# Patient Record
Sex: Female | Born: 1945 | Race: Black or African American | Hispanic: No | Smoking: Never smoker
Health system: Southern US, Community
[De-identification: ages and names within clinical notes are randomized; demographics above are authoritative.]

## PROBLEM LIST (undated history)

## (undated) DIAGNOSIS — F329 Major depressive disorder, single episode, unspecified: Secondary | ICD-10-CM

## (undated) DIAGNOSIS — T148XXA Other injury of unspecified body region, initial encounter: Secondary | ICD-10-CM

## (undated) DIAGNOSIS — F0390 Unspecified dementia without behavioral disturbance: Secondary | ICD-10-CM

## (undated) DIAGNOSIS — R269 Unspecified abnormalities of gait and mobility: Secondary | ICD-10-CM

## (undated) DIAGNOSIS — T48201A Poisoning by unspecified drugs acting on muscles, accidental (unintentional), initial encounter: Secondary | ICD-10-CM

## (undated) DIAGNOSIS — I10 Essential (primary) hypertension: Secondary | ICD-10-CM

## (undated) DIAGNOSIS — R413 Other amnesia: Secondary | ICD-10-CM

## (undated) DIAGNOSIS — F09 Unspecified mental disorder due to known physiological condition: Secondary | ICD-10-CM

## (undated) DIAGNOSIS — I82409 Acute embolism and thrombosis of unspecified deep veins of unspecified lower extremity: Secondary | ICD-10-CM

## (undated) DIAGNOSIS — D219 Benign neoplasm of connective and other soft tissue, unspecified: Secondary | ICD-10-CM

## (undated) DIAGNOSIS — E785 Hyperlipidemia, unspecified: Secondary | ICD-10-CM

## (undated) DIAGNOSIS — N319 Neuromuscular dysfunction of bladder, unspecified: Secondary | ICD-10-CM

## (undated) DIAGNOSIS — F32A Depression, unspecified: Secondary | ICD-10-CM

## (undated) DIAGNOSIS — L089 Local infection of the skin and subcutaneous tissue, unspecified: Secondary | ICD-10-CM

## (undated) DIAGNOSIS — G500 Trigeminal neuralgia: Secondary | ICD-10-CM

## (undated) DIAGNOSIS — G35 Multiple sclerosis: Secondary | ICD-10-CM

## (undated) HISTORY — PX: OTHER SURGICAL HISTORY: SHX169

## (undated) HISTORY — DX: Other amnesia: R41.3

## (undated) HISTORY — DX: Neuromuscular dysfunction of bladder, unspecified: N31.9

## (undated) HISTORY — DX: Acute embolism and thrombosis of unspecified deep veins of unspecified lower extremity: I82.409

## (undated) HISTORY — DX: Unspecified abnormalities of gait and mobility: R26.9

## (undated) HISTORY — PX: STRABISMUS SURGERY: SHX218

---

## 2001-08-21 ENCOUNTER — Other Ambulatory Visit: Admission: RE | Admit: 2001-08-21 | Discharge: 2001-08-21 | Payer: Self-pay | Admitting: Obstetrics and Gynecology

## 2001-08-27 ENCOUNTER — Ambulatory Visit (HOSPITAL_COMMUNITY): Admission: RE | Admit: 2001-08-27 | Discharge: 2001-08-27 | Payer: Self-pay | Admitting: Obstetrics and Gynecology

## 2001-08-27 ENCOUNTER — Encounter: Payer: Self-pay | Admitting: Obstetrics and Gynecology

## 2001-10-31 ENCOUNTER — Encounter: Admission: RE | Admit: 2001-10-31 | Discharge: 2002-01-09 | Payer: Self-pay | Admitting: Neurology

## 2002-05-19 ENCOUNTER — Emergency Department (HOSPITAL_COMMUNITY): Admission: EM | Admit: 2002-05-19 | Discharge: 2002-05-19 | Payer: Self-pay

## 2002-11-11 ENCOUNTER — Encounter: Admission: RE | Admit: 2002-11-11 | Discharge: 2002-12-09 | Payer: Self-pay | Admitting: Neurology

## 2003-07-01 ENCOUNTER — Encounter: Admission: RE | Admit: 2003-07-01 | Discharge: 2003-09-29 | Payer: Self-pay | Admitting: Neurology

## 2005-06-25 ENCOUNTER — Emergency Department (HOSPITAL_COMMUNITY): Admission: EM | Admit: 2005-06-25 | Discharge: 2005-06-26 | Payer: Self-pay | Admitting: Emergency Medicine

## 2005-12-19 ENCOUNTER — Other Ambulatory Visit: Admission: RE | Admit: 2005-12-19 | Discharge: 2005-12-19 | Payer: Self-pay | Admitting: Family Medicine

## 2005-12-20 ENCOUNTER — Encounter: Admission: RE | Admit: 2005-12-20 | Discharge: 2005-12-20 | Payer: Self-pay | Admitting: Family Medicine

## 2006-04-04 ENCOUNTER — Inpatient Hospital Stay (HOSPITAL_COMMUNITY): Admission: EM | Admit: 2006-04-04 | Discharge: 2006-04-09 | Payer: Self-pay | Admitting: Emergency Medicine

## 2006-06-25 ENCOUNTER — Inpatient Hospital Stay (HOSPITAL_COMMUNITY): Admission: EM | Admit: 2006-06-25 | Discharge: 2006-06-26 | Payer: Self-pay | Admitting: Emergency Medicine

## 2006-11-29 ENCOUNTER — Encounter: Admission: RE | Admit: 2006-11-29 | Discharge: 2006-12-19 | Payer: Self-pay | Admitting: Neurology

## 2007-05-15 ENCOUNTER — Encounter: Admission: RE | Admit: 2007-05-15 | Discharge: 2007-08-13 | Payer: Self-pay | Admitting: Neurology

## 2007-07-13 ENCOUNTER — Other Ambulatory Visit: Admission: RE | Admit: 2007-07-13 | Discharge: 2007-07-13 | Payer: Self-pay | Admitting: Family Medicine

## 2009-07-23 ENCOUNTER — Inpatient Hospital Stay (HOSPITAL_COMMUNITY): Admission: AD | Admit: 2009-07-23 | Discharge: 2009-07-28 | Payer: Self-pay | Admitting: Neurology

## 2009-07-27 ENCOUNTER — Ambulatory Visit: Payer: Self-pay | Admitting: Physical Medicine & Rehabilitation

## 2009-07-28 ENCOUNTER — Inpatient Hospital Stay (HOSPITAL_COMMUNITY)
Admission: RE | Admit: 2009-07-28 | Discharge: 2009-08-13 | Payer: Self-pay | Admitting: Physical Medicine & Rehabilitation

## 2010-10-31 ENCOUNTER — Encounter: Payer: Self-pay | Admitting: Neurology

## 2011-01-13 ENCOUNTER — Emergency Department (HOSPITAL_COMMUNITY)
Admission: EM | Admit: 2011-01-13 | Discharge: 2011-01-13 | Disposition: A | Discharge status: Home / Self Care | Payer: Medicare Other | Attending: Emergency Medicine | Admitting: Emergency Medicine

## 2011-01-13 ENCOUNTER — Emergency Department (HOSPITAL_COMMUNITY): Payer: Medicare Other

## 2011-01-13 DIAGNOSIS — R51 Headache: Secondary | ICD-10-CM | POA: Insufficient documentation

## 2011-01-13 DIAGNOSIS — I10 Essential (primary) hypertension: Secondary | ICD-10-CM | POA: Insufficient documentation

## 2011-01-13 DIAGNOSIS — Z79899 Other long term (current) drug therapy: Secondary | ICD-10-CM | POA: Insufficient documentation

## 2011-01-13 DIAGNOSIS — G35 Multiple sclerosis: Secondary | ICD-10-CM | POA: Insufficient documentation

## 2011-01-13 DIAGNOSIS — I2510 Atherosclerotic heart disease of native coronary artery without angina pectoris: Secondary | ICD-10-CM | POA: Insufficient documentation

## 2011-01-13 DIAGNOSIS — E785 Hyperlipidemia, unspecified: Secondary | ICD-10-CM | POA: Insufficient documentation

## 2011-01-13 DIAGNOSIS — R42 Dizziness and giddiness: Secondary | ICD-10-CM | POA: Insufficient documentation

## 2011-01-13 DIAGNOSIS — R110 Nausea: Secondary | ICD-10-CM | POA: Insufficient documentation

## 2011-01-13 DIAGNOSIS — M6281 Muscle weakness (generalized): Secondary | ICD-10-CM | POA: Insufficient documentation

## 2011-01-13 LAB — URINALYSIS, ROUTINE W REFLEX MICROSCOPIC
Bilirubin Urine: NEGATIVE
Bilirubin Urine: NEGATIVE
Glucose, UA: NEGATIVE mg/dL
Hgb urine dipstick: NEGATIVE
Ketones, ur: NEGATIVE mg/dL
Nitrite: POSITIVE — AB
Specific Gravity, Urine: 1.014 (ref 1.005–1.030)
Urobilinogen, UA: 0.2 mg/dL (ref 0.0–1.0)
pH: 6.5 (ref 5.0–8.0)

## 2011-01-13 LAB — COMPREHENSIVE METABOLIC PANEL
ALT: 23 U/L (ref 0–35)
Albumin: 2.8 g/dL — ABNORMAL LOW (ref 3.5–5.2)
BUN: 13 mg/dL (ref 6–23)
BUN: 12 mg/dL (ref 6–23)
CO2: 28 mEq/L (ref 19–32)
Chloride: 100 mEq/L (ref 96–112)
Chloride: 100 mEq/L (ref 96–112)
Creatinine, Ser: 0.61 mg/dL (ref 0.4–1.2)
GFR calc Af Amer: >60 mL/min (ref >60)
GFR calc Af Amer: >60 mL/min (ref >60)
Glucose, Bld: 94 mg/dL (ref 70–99)
Potassium: 3.8 mEq/L (ref 3.5–5.1)
Sodium: 135 mEq/L (ref 135–145)
Total Bilirubin: 0.3 mg/dL (ref 0.3–1.2)

## 2011-01-13 LAB — URINE CULTURE
Colony Count: >=100000
Colony Count: NO GROWTH
Culture: NO GROWTH

## 2011-01-13 LAB — URINE MICROSCOPIC-ADD ON

## 2011-01-13 LAB — BASIC METABOLIC PANEL
Chloride: 99 mEq/L (ref 96–112)
GFR calc Af Amer: >60 mL/min (ref >60)
Glucose, Bld: 100 mg/dL — ABNORMAL HIGH (ref 70–99)

## 2011-01-13 LAB — CARBAMAZEPINE LEVEL, TOTAL: Carbamazepine Lvl: 9.9 ug/mL (ref 4.0–12.0)

## 2011-01-13 LAB — CBC
HCT: 32.2 % — ABNORMAL LOW (ref 36.0–46.0)
HCT: 37.1 % (ref 36.0–46.0)
HCT: 33.7 % — ABNORMAL LOW (ref 36.0–46.0)
Hemoglobin: 12.3 g/dL (ref 12.0–15.0)
Hemoglobin: 11.5 g/dL — ABNORMAL LOW (ref 12.0–15.0)
MCHC: 33.2 g/dL (ref 30.0–36.0)
MCV: 93.7 fL (ref 78.0–100.0)
Platelets: 218 10*3/uL (ref 150–400)
RBC: 3.43 MIL/uL — ABNORMAL LOW (ref 3.87–5.11)
RBC: 3.95 MIL/uL (ref 3.87–5.11)
RBC: 3.58 MIL/uL — ABNORMAL LOW (ref 3.87–5.11)
RDW: 14.6 % (ref 11.5–15.5)
RDW: 15.3 % (ref 11.5–15.5)
WBC: 6.1 10*3/uL (ref 4.0–10.5)
WBC: 7.5 10*3/uL (ref 4.0–10.5)

## 2011-01-13 LAB — DIFFERENTIAL
Basophils Absolute: 0 10*3/uL (ref 0.0–0.1)
Basophils Relative: 0 % (ref 0–1)
Basophils Relative: 0 % (ref 0–1)
Eosinophils Absolute: 0 10*3/uL (ref 0.0–0.7)
Eosinophils Absolute: 0 10*3/uL (ref 0.0–0.7)
Eosinophils Relative: 0 % (ref 0–5)
Lymphocytes Relative: 21 % (ref 12–46)
Lymphocytes Relative: 41 % (ref 12–46)
Monocytes Absolute: 0.7 10*3/uL (ref 0.1–1.0)
Monocytes Relative: 11 % (ref 3–12)
Monocytes Relative: 8 % (ref 3–12)
Neutro Abs: 4.1 10*3/uL (ref 1.7–7.7)
Neutrophils Relative %: 51 % (ref 43–77)

## 2011-01-13 LAB — PROTIME-INR
INR: 0.98 (ref 0.00–1.49)
Prothrombin Time: 12.9 seconds (ref 11.6–15.2)

## 2011-01-13 LAB — APTT: aPTT: 27 seconds (ref 24–37)

## 2011-01-26 ENCOUNTER — Emergency Department (HOSPITAL_COMMUNITY)
Admission: EM | Admit: 2011-01-26 | Discharge: 2011-01-26 | Disposition: A | Discharge status: Home / Self Care | Payer: Medicare Other | Attending: Emergency Medicine | Admitting: Emergency Medicine

## 2011-01-26 DIAGNOSIS — Z79899 Other long term (current) drug therapy: Secondary | ICD-10-CM | POA: Insufficient documentation

## 2011-01-26 DIAGNOSIS — G35 Multiple sclerosis: Secondary | ICD-10-CM | POA: Insufficient documentation

## 2011-01-26 DIAGNOSIS — I10 Essential (primary) hypertension: Secondary | ICD-10-CM | POA: Insufficient documentation

## 2011-01-26 DIAGNOSIS — R51 Headache: Secondary | ICD-10-CM | POA: Insufficient documentation

## 2011-01-26 DIAGNOSIS — G500 Trigeminal neuralgia: Secondary | ICD-10-CM | POA: Insufficient documentation

## 2011-01-26 DIAGNOSIS — I2510 Atherosclerotic heart disease of native coronary artery without angina pectoris: Secondary | ICD-10-CM | POA: Insufficient documentation

## 2011-02-25 NOTE — Procedures (Signed)
EEG NUMBER:  [id number].   CLINICAL HISTORY:  The patient is a 65 year old with a history of overdose.  Study is being done to look for the presence of seizures.   PROCEDURE:  The procedure is carried out of 32-channel digital Cadwell  recorder reformatted into 16 channel montages with one devoted to EKG.  The  patient was awake and alert during the recording.  The International 10/20  system lead placement was used.   DESCRIPTION OF FINDINGS:  Dominant frequency is 25 microvolts 6 Hz theta  range activity that is broadly distributed. Superimposed upon this is an 80  microvolt upper delta range activity.  The background never shows a normal  alpha rhythm.  There was no focal slowing.  There was no interictal  epileptiform activity in the form of spikes or sharp waves.  EKG showed a  regular sinus rhythm with ventricular response of 78 beats per minute.   IMPRESSION:  Abnormal EEG on the basis of diffuse background slowing.  This  is a nonspecific indicator of neuronal dysfunction that may be on a toxic  metabolic basis following an overdose or may be part of an underlying static  encephalopathy.  The findings require careful clinical correlation.      Deanna Artis. Sharene Skeans, M.D.  Electronically Signed     YWV:PXTG  D:  04/06/2006 16:18:41  T:  04/06/2006 18:42:23  Job #:  [id number]

## 2011-02-25 NOTE — H&P (Signed)
NAMESHUNTEL, FISHBURN             ACCOUNT NO.:  [id number]   MEDICAL RECORD NO.:  [id number]          PATIENT TYPE:  OUT   LOCATION:  XRAY                         FACILITY:  [facility]   PHYSICIAN:  Melissa L. Ladona Ridgel, MD  DATE OF BIRTH:  [birth year]   DATE OF ADMISSION:  04/04/2006  DATE OF DISCHARGE:                                HISTORY & PHYSICAL   CHIEF COMPLAINT:  Unresponsiveness.   PRIMARY CARE PHYSICIAN:  Jethro Bastos, M.D.   HISTORY OF PRESENT ILLNESS:  The patient is a 65 year old African-American  female with a past medical history for depression, hypertension, multiple  sclerosis, and trigeminal neuralgia.  The patient's daughter states that  last night she was very restless, was up moving around the house a lot and  likely may have been taking medication for pain since she was restless.  This morning she was seen and evaluated by a home nurse who found her to be  quite somnolent.  The patient's daughter went out to pick up some  medications because she found there was no Carbamazepine left in one of her  bottles, and she could not find the dose she thought was there, and she  needed to pick up some of her antihypertensive medications.  When she came  back, she noticed that the Baclofen was missing and noticed that her mom  continued to be somnolent.  Therefore, she called EMS.  EMS came and  determined that she actually had possibly taken 1/2 bottle of Baclofen which  likely was accidental as the patient had not been confused of other  medications before. The daughter could not locate her Carbamazepine at the  house; therefore, ingestion was worrisome for Carbamazepine as well as  Baclofen. The patient had not taken any of her other medications including  her antihypertensives today.   REVIEW OF SYSTEMS:  This cannot be obtained because the patient is obtunded.   PAST MEDICAL HISTORY:  1.  Depression.  2.  Trigeminal neuralgia.  3.  Multiple sclerosis.  4.   Increased cholesterol.  5.  Hypertension.   PAST SURGICAL HISTORY:  May have been on her eyes.  She has 2 children.   SOCIAL HISTORY:  She does not smoke. She does not drink.  She does not use  illicit drugs.   FAMILY HISTORY:  Mom is deceased from ovarian cancer.  Dad is deceased from  liver failure.  Sister died with an aneurysm, and her brother died with  acute pancreatitis.   MEDICATIONS:  1.  Tegretol 200 mg p.o. 3 times a day.  2.  Micardis 2 tablets 80/12.5.  3.  Tylenol PM.  4.  Aleve was given to her just prior to coming to the hospital.  5.  Baclofen 1/2 tablet 3 times daily.  6.  Potassium 20 mEq twice daily.  7.  Zoloft 100 mg 1/2 tablet once daily.  8.  Prednisone as needed, last dose some time ago.  9.  Injection of Avonex.  Her last dose was last Tuesday.  10. Aricept 5 mg p.o. daily.  11. Nifediac 30 mg daily.  12.  Centerline 150 mg daily.  13. Hydrochlorothiazide 25 mg daily.  14. Zocor 80 mg nightly.   PHYSICAL EXAMINATION:  VITAL SIGNS: Temperature 99, blood pressure 140/90,  pulse 63, respirations 22, O2 saturation 98%.  GENERAL:  The patient is obtunded with intermittent wakefulness and  startling reaction.  HEENT: Normocephalic and atraumatic.  She has mild right eye ptosis.  Her  pupils are equal but slowly reactive.  Mucous membranes are dry.  NECK:  Supple.  There is no JVD, no lymphadenopathy, no carotid bruits.  CHEST: Clear to auscultation with decreased breath sounds and no rhonchi,  rales, or wheezes.  She is continually clearing her throat.  CARDIOVASCULAR:  Regular rate and rhythm.  Positive S1 and S2.  No S3, S4,  murmurs, rubs, or gallops.  ABDOMEN: Soft, nontender, nondistended, with positive bowel sounds.  EXTREMITIES:  She has spasticity and slow Achilles reflexes.  She otherwise  is unable to follow commands and appears stiff.  NEUROLOGIC:  Cannot follow commands.  She startles.  She has numerous  nystagmus.  Power cannot be assessed  secondary to decreased level of  consciousness.   LABORATORY DATA:  Her first ABG at approximately 1500 was 7.399 with pCO2 of  44.6, pO2 87.  Her bicarb is 27.5, and her repeat gas around 2000 was 7.407,  pCO2 of 42.6, and an O2 of 83.  Her bicarb was 28.  Her Tegretol level was  7.1.  Urinalysis has moderate leukocyte esterase with 7-10 wbc's. White  count 5.9, hemoglobin 12.6, hematocrit 36.3, platelets 359.  Urine drug  screen is positive for benzodiazepines.  Sodium 137, potassium 3.4, chloride  104, CO2 26, BUN 12, creatinine 0.7, glucose 117.  LFTs are within normal  limits.   ASSESSMENT AND PLAN:  This is a 65 year old African-American female with a  history of hypertension, multiple sclerosis, who may have taken an  unintentional overdose of Baclofen. The patient is somnolent with startle  reaction and is not communicating at this time. She is having periods of  apnea.  1.  Neurologically, CT appears not to have any acute infarction.  She will      need neurology checks q. 2 h.  Dr. [name] is kind enough to see her and      will contemplate possibly using stress dose steroids with her.  2.  Cardiovascular.  Will check some cardiac markers.  I will be holding her      oral medications, will start her on some IV ACE inhibitors with p.r.n.      labetalol.  We also could use hydralazine if needed.  We will watch for      rebound tachycardia, offer nifedipine, and we could also use a Cardizem      drip.  3.  Pulmonary. Currently she has no CO2 retention.  She is having some      periods of apnea and borderline Cheyne-Stoke breathing.  Will monitor      her closely and intubate her if necessary.  4.  Gastrointestinal.  She is n.p.o.  When she is more awake, will do a      speech evaluation.  5.  Genitourinary.  Foley to gravity.  Urinary tract infection will be      treated with ceftriaxone, and will follow up cultures. She will need     blood cultures as well.  6.  Endocrine.   Question whether or not stress dose steroids would be      appropriate. Dr.  [name] will address that when he thinks she is ready      for it.  She has responded favorably in the past with regards to      confusion related to multiple sclerosis use.      Melissa L. Ladona Ridgel, MD  Electronically Signed     MLT/MEDQ  D:  04/04/2006  T:  04/04/2006  Job:  [id number]   cc:   [name], M.D.  Fax: [phone]   Jethro Bastos, M.D.  Fax: [phone]

## 2011-02-25 NOTE — H&P (Signed)
[name], [name]             ACCOUNT NO.:  [id number]   MEDICAL RECORD NO.:  [id number]          PATIENT TYPE:  INP   LOCATION:  6703                         FACILITY:  [facility]   PHYSICIAN:  Jackie Plum, M.D.DATE OF BIRTH:  [birth year]   DATE OF ADMISSION:  06/25/2006  DATE OF DISCHARGE:                                HISTORY & PHYSICAL   COMMENT:  Please note that the patient was unable to give a significant  consistent history because of her mental status change, so history was  provided by the ER physician and patient's records.  The patient's son had  left by the time of my evaluation.   HISTORY OF PRESENT ILLNESS:  This is a 65 year old patient with history of  multiple sclerosis, dyslipidemia, coronary artery disease and hypertension,  who was brought to the [facility] ED for further evaluation of altered  mental status.  According to the ER physician, the family had felt that the  patient was feeling weaker and weaker, not able to do much for herself with  slow speech and drooling.  They felt that she had possibly taken too much  of her medications, as it had had happened previously; on account of this,  the patient was brought to the ED.  At the emergency room, the patient had a  CT scan of the head done which was negative.  She also had an i-STAT done  which showed a sodium of 138, potassium of 3.5, chloride of 105, BUN 18,  glucose 105, venous pH of 7.356, venous PCO2 of 45.3 and venous bicarbonate  of 25.3.  Hematocrit was 44, hemoglobin 15.0.  Her UA was unremarkable,  except for urine ketones of 15 mg/dL.  She had a urine drug screen done  which was positive for benzodiazepines, otherwise unremarkable.  Her CBC did  not reveal any leukocytosis; indeed white count was 9.0 and hemoglobin 13.9.  She had a normal platelet count.  She had an elevated Tegretol level of 18.5  mcg/mL.  The patient was thought possibly to have taken too much of her  medicines and that at  least observation overnight may be warranted and the  hospitalist service was asked to evaluate for admission.  The patient denies  any constant pain.  She denies any nausea or vomiting.  It is very difficult  to get any consistent history, otherwise.  She denies any chest pain.   PAST MEDICAL HISTORY:  As noted above.   CURRENT MEDICINES:  Micardis HCT, potassium chloride, Aricept, simvastatin,  carbamazepine, hydrochlorothiazide, Baclofen, sertraline and nifedipine.   ALLERGIES:  The patient does not have any medication allergies.   FAMILY HISTORY:  Positive for ovarian cancer.  There is also history of  aneurysm.   SOCIAL HISTORY:  The patient does not smoke cigarettes nor drink alcohol.   REVIEW OF SYSTEMS:  As noted above, otherwise, not consistently obtainable.   PHYSICAL EXAMINATION:  VITAL SIGNS:  BP was 136/75, pulse 78, respirations  20, temperature 97.6 degrees Fahrenheit, O2 SAT 98%.  GENERAL:  The patient is in no acute cardiopulmonary distress.  She was  alert, but a little bit drowsy; however, she was easily arousable and would  answer some of the questions, but fall back to sleep.  HEENT:  Oropharynx moist.  Extraocular movements were intact.  Pupils were  equally round, reactive to light and accommodation.  NECK:  Supple.  No JVD.  LUNGS:  Clear to auscultation.  CARDIAC:  Regular.  No gallops.  ABDOMEN:  Soft and nontender.  EXTREMITIES:  No cyanosis.  CNS:  The patient moves all extremities.  She does not have any nystagmus.  There are no obvious facial droops noted.   LABORATORY WORK:  As noted above.   ASSESSMENT AND PLAN:  The patient's mental status change is likely  medication-related in a patient with multiple sclerosis.  Her urine drug  screen was positive for benzodiazepines.  We will hold most of her medicines  including carbamazepine, Aricept, sertraline and give her some saline  infusion and we will check a Tegretol level in the morning.  We will  check a  TSH and comprehensive metabolic panel.  We will also check a total CPK to  rule out any rhabdomyolysis.  Further recommendations will be as the data  base is __________.      Jackie Plum, M.D.  Electronically Signed     GO/MEDQ  D:  06/26/2006  T:  06/26/2006  Job:  [id number]

## 2011-02-25 NOTE — Discharge Summary (Signed)
[name], [name]             ACCOUNT NO.:  [id number]   MEDICAL RECORD NO.:  [id number]          PATIENT TYPE:  INP   LOCATION:  3008                         FACILITY:  [facility]   PHYSICIAN:  Kela Millin, M.D.DATE OF BIRTH:  January 29, 1946   DATE OF ADMISSION:  04/04/2006  DATE OF DISCHARGE:  04/09/2006                                 DISCHARGE SUMMARY   DISCHARGE DIAGNOSES:  1.  Unintentional Baclofen overdose.  2.  Probable urinary tract infection.  3.  Multiple sclerosis.  4.  History of organic brain syndrome.  5.  Hypertension.  6.  Hyperlipidemia.  7.  History of neurogenic bladder.  8.  History of trigeminal neuralgia.   STUDIES:  1.  EEG: Abnormal EEG on the basis of diffuse background slowing.      Nonspecific indicator of neuronal dysfunction that may be on a toxic      metabolic basis following an overdose as well as underlying static      encephalopathy.  2.  CT scan of brain: Multifocal periventricular white matter hypodensities      consistent with a combination of a microvascular ischemic change and MS      plaque formation, patient with known history of Multiple sclerosis.  No      acute intracranial hemorrhage.   CONSULTATIONS:  Neurology: [name], M.D.   HISTORY:  The patient is a 65 year old black female with past medical  history significant for depression, hypertension, multiple sclerosis,  organic brain syndrome, and trigeminal neuralgia, who presented with  unresponsiveness.  According to the daughter, on the night prior to  admission, the patient was very restless and was moving around the house a  lot and likely may have taken the medication for spasm and pain since she  was restless.  On the morning of admission, the patient was seen and  evaluated by a home nurse who found her to be quite somnolent.  The  patient's daughter went out to pick up some medications and found there was  no carbamazepine left in one of her bottles, and she  could not find the dose  she thought was there.  When she came back, she noticed that the Baclofen  was missing, and her mom continued to be somnolent.  Therefore, she called  EMS.  EMS came and determined that she actually had possibly taken 1/2  bottle of Baclofen which was likely accidental as the patient had not been  confused with her other medications before.  The daughter could not locate  her carbamazepine at the house, and, therefore, the concern was whether or  not she had also ingested the carbamazepine accidentally. The patient had  not taken any of her other medications including her antihypertensive on the  day of admission.   Physical exam upon admission is per Dr. Derenda Mis is reviewed.  Temperature was 99 with a blood pressure of 140/90, pulse 63, respiratory  rate 22, O2 saturation 98%.  In general, the patient was obtunded with  intermittent wakefulness and a startle reaction.  Other pertinent findings  on HEENT, mucous membranes were dry  and a mild right eye ptosis noted.  On  her extremities, she was noted to have increased tone, spasticity, and slow  Achilles reflexes.  She was unable to follow commands and appears stiff.  On  the neurological exam, she was unable to follow commands.  Startle noted,  nystagmus.  Power could not be assessed secondary to decreased level of  consciousness.   On the laboratory data, her initial ABG showed a pH of 7.39 with a pCO of  44, pO2 of 87, bicarb of 27.5.  Repeat ABG showed a pH of 7.40, pCO2 of  42.6, O2 of 83, and bicarb 28.  Tegretol level was 7.1.  Urinalysis showed  moderate leukocyte esterase with 7-10 wbc's. White cell count was 5.9,  hemoglobin 12.6, hematocrit 36.3, platelet count 359.  Urine drug screen  positive for benzodiazepines.  Sodium 137, potassium 3.4, chloride 104, CO2  26, BUN 12, creatinine 0.7, glucose 117, LFT's within normal limits.   HOSPITAL COURSE:  #1.  UNINTENTIONAL BACLOFEN OVERDOSE:  Upon  admission, the patient was  monitored in the intensive care unit.  The patient's Baclofen was held,  neurology consulted, and Dr. [name] saw the patient.  The neurological  impression was that her altered mental status was likely secondary to an  unintentional Baclofen overdose.  An EEG was done, and the results are  stated above.  By the next hospital day, the patient was awake, alert, and  her mentation continued to improve and is back to baseline at the time of  discharge.  She has remained alert, conversant, and appropriate, and  hemodynamically stable.  Dr. [name] talked with patient's daughter, and she  will be monitoring and administering patient's medications to avoid any  recurrence.  Home health RN also to follow with patient to monitor  medications.  The patient's Baclofen was resumed once she was alert and  appropriate.  She has tolerated it well without any side effects.   #2.  PROBABLE URINARY TRACT INFECTION: The patient had a urinalysis done  upon admission with the results as stated above  The patient was empirically  started on IV antibiotics on admission.  Unfortunately, the urine cultures  were not sent until 2 days after the patient had been on IV antibiotics, and  the urine cultures showed no growth. The patient has received an adequate  course of IV antibiotics, has remained afebrile with no leukocytosis, and  will not require any further antibiotics upon discharge.   #3.  MULTIPLE SCLEROSIS: The patient was empirically started on IV steroids  per neurology upon admission.  Once she became alert and appropriate, she  was changed to oral prednisone which has been tapered by 5 mg every 2 days  as per neurology recommendations.  Physical therapy was consulted while  patient was in the hospital as well as occupational therapy, and they have  followed patient throughout hospital stay, and she has been ambulating with assistance.  She will be discharged at this time with  home health PT and OT.  The patient also had a swallowing study done while in the hospital.  No  aspiration reported, and a regular diet was recommended.   #4.  HYPERTENSION:  The patient is to continue her outpatient  antihypertensives upon discharge.   #5.  HYPERLIPIDEMIA: The patient was maintained on Zocor during hospital  stay.   DISCHARGE MEDICATIONS:  1.  Prednisone taper as directed.  The patient is to take 40 mg for 1 more  day, then continue tapering as directed by 5 mg every 2 days.  2.  The patient is continue preadmission medications.   FOLLOW UP:  1.  Jethro Bastos, M.D. in 1 week.  2.  [name], M.D. in 2-3 weeks.   CONDITION ON DISCHARGE:  Improved and stable.      Kela Millin, M.D.  Electronically Signed     ACV/MEDQ  D:  04/09/2006  T:  04/09/2006  Job:  [id number]   cc:   Jethro Bastos, M.D.  Fax: [phone]   C. Lesia Sago, M.D.  Fax: [phone]

## 2011-02-25 NOTE — Consult Note (Signed)
[name], Chen NO.:  [id number]   MEDICAL RECORD NO.:  [id number]          PATIENT TYPE:  INP   LOCATION:  2108                         FACILITY:  [facility]   PHYSICIAN:  [name], M.D.  DATE OF BIRTH:  11-21-1945   DATE OF CONSULTATION:  04/04/2006  DATE OF DISCHARGE:                                   CONSULTATION   [name] is a 65 year old right-handed black female born July 21, 1946, with a history of multiple sclerosis for a number of years with  progressive gait instability, bowel and bladder control problems, and  organic brain syndrome.  The patient has a neurogenic bladder as well. The  patient apparently has had a worsening of her trigeminal neuralgia pain over  the last day or two and has been taking an increased number of medications  particularly Baclofen.  The patient did not sleep well last evening and was  noted to be in pain this morning, somewhat agitated. The patient was found  this afternoon, somewhat somnolent, not responding well. The patient is  brought to the emergency room for an evaluation.  CT scan of the head has  been done and shows chronic white matter changes consistent with MS and  small vessel disease.  The patient is having apneic spells, but is breathing  effectively at other times and blood gas evaluations have shown the patient  is not retaining CO2, is not acidotic.  Most recent blood gas of 7.4, PCO2  42.6, PO2 83, bicarbonate 26.7.  Neurology is asked to see this patient for  further evaluation.   PAST MEDICAL HISTORY:  1.  Altered mental status,  probable medication overdose.  2.  Multiple sclerosis.  3.  Organic brain syndrome.  4.  Gait disorder.  5.  Hypertension.  6.  Hyperlipidemia.  7.  Neurogenic bladder.  8.  Trigeminal neuralgia.   MEDICATIONS:  1.  Micardis/HCT 80/12/5 mg one b.i.d.  2.  Simvastatin 80 mg daily.  3.  Avonex injections weekly.  4.  Zoloft 150 mg daily.  5.   Nifedipine ER 30 mg daily.  6.  Tegretol 200 mg t.i.d.  7.  Baclofen 5 mg t.i.d.  8.  Potassium supplementation 20 mEq daily.  9.  Xanax 0.25 mg t.i.d. p.r.n.  10. Aricept 5 mg daily.   ALLERGIES:  SHELLFISH.   SOCIAL HISTORY:  The patient does not smoke or drink. The patient is  retired, lives in the [place], [place] area. The patient was  separated in 1992.  The patient has a son and a daughter.   FAMILY HISTORY:  Notable for hypertension in father, heart disease in her  father, uterine cancer in her mother.   REVIEW OF SYSTEMS:  Cannot be obtained at this time. The patient is too  somnolent, nonverbal.   PHYSICAL EXAMINATION:  VITAL SIGNS: Blood pressure 174/63 with initial blood  pressure 227/87, heart rate 66, respiratory rate 20, temperature 99.0.  GENERAL: The patient is a well-developed black female who is stuporous at  the time of examination.  HEENT:  Head is atraumatic. Pupils are  equal, round, and reactive to light.  Discs are flat.  NECK:  Increased tone.  No bruits noted.  RESPIRATORY:  Relatively clear.  The patient is apneic at times, however,  and at other times breathes effectively. During periods the patient breathes  she may be somewhat twitchy and slightly agitated.  NEUROLOGIC: The patient has increased tone in all fours, lower and upper  extremities. The patient will respond somewhat to deep pain stimulation on  the extremities. Tone is symmetric from one side to the next. The patient  will not follow commands for cerebellar testing, could not be ambulated.  Deep tendon reflexes are relatively symmetric. Toes are neutral bilaterally.   LABORATORY VALUES:  Tegretol level of 3.1.  White count 5.9, hemoglobin  12.6, hematocrit 36.3, MCV 92.9, platelet count 359,000.  The patient has a  sodium of 137, potassium 3.4, chloride 104, CO2 26, glucose 117, BUN 12,  creatinine 0.7, calcium 8.7, total protein 6.5, albumin 3.7, AST 14,  ALT  11, alkaline  phosphatase 73, total bilirubin 0.4.  Alcohol level less than  5.  Urinalysis reveals specific gravity of 1.023, pH of 6.5.  The patient  has 7-10 white cells. Drug screen is notable for benzodiazepines, otherwise  negative.   CT is as above.   IMPRESSION:  1.  Multiple sclerosis.  2.  Organic brain syndrome.  3.  Hypertension.  4.  Altered mental status with probable drug overdose.   This patient has significant pain from trigeminal neuralgia, likely  overdosed inadvertently trying to control the pain. The patient clearly has  wide swings in mental status according to the family, but in the past has  responded fairly well to the use of steroids.  The patient usually gets  significant boost from the use of Avonex weekly, but lately this has not  been effective.   PLAN:  1.  The patient will be admitted for observation and medical support.  2.  Initiate IV methylprednisolone for four to five days, beginning on the      27th of June, once the patient is more alert.  3.  Physical therapy and occupational therapy when appropriate.  The patient      will need a speech therapy evaluation for swallowing. This is set up      prior to admission, but it has not been done as an outpatient.  4.  Restart oral Tegretol and baclofen when appropriate.  5.  EEG study. Need to be careful not to let the patient completely withdraw      from baclofen as  this may result in status epilepticus.  Will follow      the patient's clinical course while in-house. Use of methylprednisolone      may significantly improve trigeminal neuralgia pain while Tegretol      levels are building back up.      [name], M.D.  Electronically Signed     CKW/MEDQ  D:  04/04/2006  T:  04/05/2006  Job:  [id number]   cc:   Jethro Bastos, M.D.  Fax: [phone]   [facility]  [address]

## 2011-09-22 ENCOUNTER — Inpatient Hospital Stay (HOSPITAL_COMMUNITY)
Admission: EM | Admit: 2011-09-22 | Discharge: 2011-10-06 | DRG: 641 | Disposition: A | Discharge status: Skilled Nursing Facility | Payer: Medicare Other | Attending: Internal Medicine | Admitting: Internal Medicine

## 2011-09-22 ENCOUNTER — Encounter: Payer: Self-pay | Admitting: Emergency Medicine

## 2011-09-22 ENCOUNTER — Emergency Department (HOSPITAL_COMMUNITY): Payer: Medicare Other

## 2011-09-22 ENCOUNTER — Other Ambulatory Visit: Payer: Self-pay

## 2011-09-22 DIAGNOSIS — L89109 Pressure ulcer of unspecified part of back, unspecified stage: Secondary | ICD-10-CM | POA: Diagnosis present

## 2011-09-22 DIAGNOSIS — L8992 Pressure ulcer of unspecified site, stage 2: Secondary | ICD-10-CM | POA: Diagnosis present

## 2011-09-22 DIAGNOSIS — R1030 Lower abdominal pain, unspecified: Secondary | ICD-10-CM | POA: Diagnosis present

## 2011-09-22 DIAGNOSIS — F0390 Unspecified dementia without behavioral disturbance: Secondary | ICD-10-CM | POA: Diagnosis present

## 2011-09-22 DIAGNOSIS — G500 Trigeminal neuralgia: Secondary | ICD-10-CM | POA: Insufficient documentation

## 2011-09-22 DIAGNOSIS — G35 Multiple sclerosis: Secondary | ICD-10-CM | POA: Diagnosis present

## 2011-09-22 DIAGNOSIS — D259 Leiomyoma of uterus, unspecified: Secondary | ICD-10-CM | POA: Diagnosis present

## 2011-09-22 DIAGNOSIS — F079 Unspecified personality and behavioral disorder due to known physiological condition: Secondary | ICD-10-CM | POA: Diagnosis present

## 2011-09-22 DIAGNOSIS — G509 Disorder of trigeminal nerve, unspecified: Secondary | ICD-10-CM | POA: Insufficient documentation

## 2011-09-22 DIAGNOSIS — R109 Unspecified abdominal pain: Secondary | ICD-10-CM | POA: Diagnosis present

## 2011-09-22 DIAGNOSIS — L89309 Pressure ulcer of unspecified buttock, unspecified stage: Secondary | ICD-10-CM | POA: Diagnosis present

## 2011-09-22 DIAGNOSIS — R627 Adult failure to thrive: Principal | ICD-10-CM | POA: Diagnosis present

## 2011-09-22 DIAGNOSIS — K029 Dental caries, unspecified: Secondary | ICD-10-CM | POA: Diagnosis present

## 2011-09-22 DIAGNOSIS — K5900 Constipation, unspecified: Secondary | ICD-10-CM | POA: Diagnosis present

## 2011-09-22 DIAGNOSIS — R6251 Failure to thrive (child): Secondary | ICD-10-CM | POA: Diagnosis present

## 2011-09-22 DIAGNOSIS — D219 Benign neoplasm of connective and other soft tissue, unspecified: Secondary | ICD-10-CM | POA: Insufficient documentation

## 2011-09-22 DIAGNOSIS — N390 Urinary tract infection, site not specified: Secondary | ICD-10-CM | POA: Diagnosis present

## 2011-09-22 DIAGNOSIS — G9340 Encephalopathy, unspecified: Secondary | ICD-10-CM | POA: Diagnosis present

## 2011-09-22 DIAGNOSIS — F32A Depression, unspecified: Secondary | ICD-10-CM | POA: Diagnosis present

## 2011-09-22 DIAGNOSIS — E785 Hyperlipidemia, unspecified: Secondary | ICD-10-CM | POA: Diagnosis present

## 2011-09-22 DIAGNOSIS — F09 Unspecified mental disorder due to known physiological condition: Secondary | ICD-10-CM | POA: Diagnosis present

## 2011-09-22 DIAGNOSIS — I10 Essential (primary) hypertension: Secondary | ICD-10-CM | POA: Diagnosis present

## 2011-09-22 DIAGNOSIS — R5381 Other malaise: Secondary | ICD-10-CM

## 2011-09-22 DIAGNOSIS — L97409 Non-pressure chronic ulcer of unspecified heel and midfoot with unspecified severity: Secondary | ICD-10-CM | POA: Diagnosis present

## 2011-09-22 DIAGNOSIS — F329 Major depressive disorder, single episode, unspecified: Secondary | ICD-10-CM | POA: Diagnosis present

## 2011-09-22 DIAGNOSIS — G894 Chronic pain syndrome: Secondary | ICD-10-CM | POA: Diagnosis present

## 2011-09-22 HISTORY — DX: Trigeminal neuralgia: G50.0

## 2011-09-22 HISTORY — DX: Essential (primary) hypertension: I10

## 2011-09-22 HISTORY — DX: Benign neoplasm of connective and other soft tissue, unspecified: D21.9

## 2011-09-22 HISTORY — DX: Poisoning by unspecified drugs acting on muscles, accidental (unintentional), initial encounter: T48.201A

## 2011-09-22 HISTORY — DX: Major depressive disorder, single episode, unspecified: F32.9

## 2011-09-22 HISTORY — DX: Unspecified mental disorder due to known physiological condition: F09

## 2011-09-22 HISTORY — DX: Hyperlipidemia, unspecified: E78.5

## 2011-09-22 HISTORY — DX: Multiple sclerosis: G35

## 2011-09-22 HISTORY — DX: Depression, unspecified: F32.A

## 2011-09-22 LAB — COMPREHENSIVE METABOLIC PANEL
ALT: 18 U/L (ref 0–35)
AST: 27 U/L (ref 0–37)
Albumin: 3.1 g/dL — ABNORMAL LOW (ref 3.5–5.2)
CO2: 26 mEq/L (ref 19–32)
Calcium: 9.2 mg/dL (ref 8.4–10.5)
Chloride: 101 mEq/L (ref 96–112)
GFR calc non Af Amer: >90 mL/min (ref >90)
Sodium: 138 mEq/L (ref 135–145)
Total Bilirubin: 0.4 mg/dL (ref 0.3–1.2)

## 2011-09-22 LAB — URINALYSIS, ROUTINE W REFLEX MICROSCOPIC
Glucose, UA: NEGATIVE mg/dL
Hgb urine dipstick: NEGATIVE
Leukocytes, UA: NEGATIVE
Protein, ur: NEGATIVE mg/dL
pH: 5.5 (ref 5.0–8.0)

## 2011-09-22 LAB — DIFFERENTIAL
Basophils Absolute: 0 10*3/uL (ref 0.0–0.1)
Basophils Relative: 0 % (ref 0–1)
Lymphocytes Relative: 15 % (ref 12–46)
Neutro Abs: 7.1 10*3/uL (ref 1.7–7.7)

## 2011-09-22 LAB — CBC
Platelets: 267 10*3/uL (ref 150–400)
RDW: 13.1 % (ref 11.5–15.5)
WBC: 9.5 10*3/uL (ref 4.0–10.5)

## 2011-09-22 LAB — TROPONIN I: Troponin I: <0.3 ng/mL (ref <0.30)

## 2011-09-22 MED ORDER — BACLOFEN 10 MG PO TABS
10.0000 mg | ORAL_TABLET | Freq: Once | ORAL | Status: DC
  Filled 2011-09-22: qty 1

## 2011-09-22 MED ORDER — TOLTERODINE TARTRATE ER 2 MG PO CP24
2.0000 mg | ORAL_CAPSULE | Freq: Once | ORAL | Status: AC
  Administered 2011-09-23: 2 mg via ORAL
  Filled 2011-09-22: qty 1

## 2011-09-22 MED ORDER — BACLOFEN 10 MG PO TABS
10.0000 mg | ORAL_TABLET | ORAL | Status: AC
  Administered 2011-09-22: 10 mg via ORAL
  Filled 2011-09-22: qty 1

## 2011-09-22 MED ORDER — CARBAMAZEPINE 200 MG PO TABS
200.0000 mg | ORAL_TABLET | Freq: Once | ORAL | Status: AC
  Administered 2011-09-22: 200 mg via ORAL
  Filled 2011-09-22: qty 1

## 2011-09-22 MED ORDER — BACLOFEN 10 MG PO TABS: 10.0000 mg | ORAL_TABLET | Freq: Three times a day (TID) | ORAL | Status: DC

## 2011-09-22 MED ORDER — LABETALOL HCL 100 MG PO TABS
100.0000 mg | ORAL_TABLET | Freq: Once | ORAL | Status: AC
  Administered 2011-09-23: 100 mg via ORAL
  Filled 2011-09-22: qty 1

## 2011-09-22 MED ORDER — SODIUM CHLORIDE 0.9 % IV SOLN: INTRAVENOUS | Status: DC

## 2011-09-22 MED ORDER — IOHEXOL 300 MG/ML  SOLN
80.0000 mL | Freq: Once | INTRAMUSCULAR | Status: AC | PRN
  Administered 2011-09-22: 80 mL via INTRAVENOUS

## 2011-09-22 MED ORDER — SODIUM CHLORIDE 0.9 % IV SOLN
INTRAVENOUS | Status: DC
  Administered 2011-09-22: 20:00:00 via INTRAVENOUS

## 2011-09-22 MED ORDER — SODIUM CHLORIDE 0.9 % IV BOLUS (SEPSIS)
1000.0000 mL | Freq: Once | INTRAVENOUS | Status: AC
  Administered 2011-09-22: 1000 mL via INTRAVENOUS

## 2011-09-22 NOTE — ED Provider Notes (Addendum)
History     CSN: [id number] Arrival date & time: 09/22/2011  3:40 PM   First MD Initiated Contact with Patient 09/22/11 1702      Chief Complaint  Patient presents with  . Multiple Sclerosis    (Consider location/radiation/quality/duration/timing/severity/associated sxs/prior treatment) HPI Comments: The patient is a 65 year old female with a history of advanced multiple sclerosis which leaves her at baseline with only limited ability to ambulate using a walker and requires home health care nursing visits to the house. She reportedly had a urinary tract infection that was diagnosed and treated in early November at which time she had associated generalized weakness, lethargy, and confusion. With treatment of urinary tract infection, her son states that her mental status had started to improve and she was getting more energy back but around mid-to-late November she started to decline again with generalized weakness increasing confusion, and in the last day to 2 days she's been reporting vague generalized abdominal discomfort. She's had no nausea, vomiting, or diarrhea. She is unable to tell me if she has any dysuria at present. She is afebrile. Her son notes that the home health care nurse had noted decubitus ulcer to the right heel and to the buttocks and sacral area that was beginning to develop because of the increased time she was spending in bed. There are no other reported symptoms at this time. Patient's son states that the patient is barely able to stand well and walk at this time.  The history is provided by a relative and a caregiver. History Limited By: Altered mental status and confusion limits the history and review of systems.    Past Medical History  Diagnosis Date  . Multiple sclerosis   . Hypertension   . Depression     History reviewed. No pertinent past surgical history.  No family history on file.  History  Substance Use Topics  . Smoking status: Never Smoker   .  Smokeless tobacco: Not on file  . Alcohol Use: No    OB History    Grav Para Term Preterm Abortions TAB SAB Ect Mult Living                  Review of Systems  Constitutional: Positive for activity change, appetite change and fatigue. Negative for fever, chills and diaphoresis.  HENT: Negative for hearing loss, ear pain, congestion, sore throat, rhinorrhea, drooling, trouble swallowing, neck pain, neck stiffness, voice change and postnasal drip.   Eyes: Negative for photophobia and visual disturbance.  Respiratory: Negative for cough, chest tightness, shortness of breath and wheezing.   Cardiovascular: Negative for chest pain, palpitations and leg swelling.  Gastrointestinal: Positive for abdominal pain. Negative for nausea, vomiting, diarrhea, constipation and abdominal distention.  Genitourinary:       Unable to assess given the patient's confusion  Musculoskeletal: Positive for gait problem. Negative for myalgias, back pain, joint swelling and arthralgias.  Skin: Negative for color change, pallor, rash and wound.  Neurological: Positive for weakness. Negative for tremors, seizures, syncope, facial asymmetry, speech difficulty, light-headedness, numbness and headaches.  Hematological: Does not bruise/bleed easily.  Psychiatric/Behavioral: Positive for confusion. Negative for behavioral problems and agitation.    Allergies  Shellfish allergy  Home Medications   Current Outpatient Rx  Name Route Sig Dispense Refill  . ACETAMINOPHEN 500 MG PO TABS Oral Take 500 mg by mouth every 6 (six) hours as needed. For pain.     Marland Kitchen ALPRAZOLAM 0.25 MG PO TABS Oral Take 0.25 mg by  mouth at bedtime as needed. For anxiety      . AMLODIPINE BESYLATE 5 MG PO TABS Oral Take 5 mg by mouth daily.      . ATORVASTATIN CALCIUM 40 MG PO TABS Oral Take 40 mg by mouth daily.      . BUPROPION HCL ER (XL) 300 MG PO TB24 Oral Take 300 mg by mouth daily.      Marland Kitchen CARBAMAZEPINE 200 MG PO TABS Oral Take 200 mg by  mouth 3 (three) times daily.      . DONEPEZIL HCL 5 MG PO TABS Oral Take 5 mg by mouth at bedtime as needed.      . OMEGA-3 FATTY ACIDS 1000 MG PO CAPS Oral Take 2 g by mouth 3 (three) times daily.      Marland Kitchen LOSARTAN POTASSIUM-HCTZ 50-12.5 MG PO TABS Oral Take 1 tablet by mouth daily.      Carma Leaven M PLUS PO TABS Oral Take 1 tablet by mouth daily.      Marland Kitchen NAPROXEN SODIUM 220 MG PO TABS Oral Take 220 mg by mouth 2 (two) times daily with a meal.      . POTASSIUM CHLORIDE CRYS CR 20 MEQ PO TBCR Oral Take 20 mEq by mouth 3 (three) times daily.      . TOLTERODINE TARTRATE 2 MG PO CP24 Oral Take 2 mg by mouth daily.      . TRAMADOL HCL 50 MG PO TABS Oral Take 50 mg by mouth every 6 (six) hours as needed. Maximum dose= 8 tablets per day. For pain        BP 103/58  Pulse 100  Temp(Src) 98.5 F (36.9 C) (Oral)  Resp 16  SpO2 98%  Physical Exam  Nursing note and vitals reviewed. Constitutional: She appears well-nourished. No distress.       The patient appears in no significant discomfort, awake, alert, and oriented to person only. She denies any pain or injury. There are no apparent injuries on examination.  HENT:  Head: Normocephalic and atraumatic. Head is without raccoon's eyes, without Battle's sign, without abrasion, without contusion, without laceration, without right periorbital erythema and without left periorbital erythema. No trismus in the jaw.  Right Ear: Tympanic membrane, external ear and ear canal normal. No drainage or tenderness. No mastoid tenderness. Tympanic membrane is not perforated. No hemotympanum.  Left Ear: Tympanic membrane, external ear and ear canal normal. No drainage or tenderness. No mastoid tenderness. Tympanic membrane is not perforated. No hemotympanum.  Nose: Nose normal. No mucosal edema, rhinorrhea, nose lacerations, sinus tenderness, nasal deformity, septal deviation or nasal septal hematoma. No epistaxis. Right sinus exhibits no maxillary sinus tenderness and no  frontal sinus tenderness. Left sinus exhibits no maxillary sinus tenderness and no frontal sinus tenderness.  Mouth/Throat: Uvula is midline, oropharynx is clear and moist and mucous membranes are normal. No lacerations.  Eyes: Conjunctivae, EOM and lids are normal. Pupils are equal, round, and reactive to light. Right eye exhibits no chemosis and no discharge. Left eye exhibits no chemosis and no discharge. Right conjunctiva is not injected. Right conjunctiva has no hemorrhage. Left conjunctiva is not injected. Left conjunctiva has no hemorrhage. Right eye exhibits normal extraocular motion. Left eye exhibits normal extraocular motion.  Neck: Trachea normal, normal range of motion and phonation normal. Neck supple. No JVD present. No tracheal tenderness, no spinous process tenderness and no muscular tenderness present. No rigidity. No tracheal deviation present.  Cardiovascular: Normal rate, regular rhythm, S1 normal, S2 normal,  normal heart sounds and intact distal pulses.  Exam reveals no gallop, no distant heart sounds and no friction rub.   No murmur heard. Pulmonary/Chest: Effort normal and breath sounds normal. No accessory muscle usage or stridor. Not tachypneic. No respiratory distress. She has no decreased breath sounds. She has no wheezes. She has no rales. She exhibits no tenderness, no bony tenderness, no crepitus, no deformity and no retraction.  Abdominal: Soft. Normal appearance and bowel sounds are normal. She exhibits no distension, no pulsatile midline mass and no mass. There is no splenomegaly or hepatomegaly. There is no tenderness. There is no rigidity, no rebound, no guarding and no CVA tenderness.  Musculoskeletal: Normal range of motion. She exhibits no edema and no tenderness.  Neurological: She is alert. She is disoriented. No cranial nerve deficit or sensory deficit. She exhibits normal muscle tone. GCS eye subscore is 4. GCS verbal subscore is 3. GCS motor subscore is 5.  Skin:  Skin is warm and dry. Abrasion and bruising noted. No rash noted. She is not diaphoretic. No erythema. No pallor.  Psychiatric: She has a normal mood and affect. Her speech is normal and behavior is normal.    ED Course  Procedures (including critical care time)   Date: 09/22/2011  Rate: 84  Rhythm: normal sinus rhythm  QRS Axis: normal  Intervals: normal  ST/T Wave abnormalities: normal  Conduction Disutrbances:none  Narrative Interpretation: Non-provocative EKG  Old EKG Reviewed: unchanged  7:38 PM At this time the patient's laboratory studies are unimpressive but I'm still waiting on a urinalysis as well as her CT abdomen and pelvis to evaluate for urinary tract infection and for possible diverticulitis respectively. At this time I will move her over to the CDU under the care of Fayrene Helper PA-C while awaiting these tests and then the determination of her disposition.  Evaluation and management procedures were performed by me prior to passing care of the patient to the mid-level provider in the CDU. Patient management continued under my collaboration and supervision as performed by the mid-level provider.   Labs Reviewed  CBC  DIFFERENTIAL  COMPREHENSIVE METABOLIC PANEL  LIPASE, BLOOD  URINALYSIS, ROUTINE W REFLEX MICROSCOPIC  TROPONIN I   No results found.   No diagnosis found.    MDM  The patient's symptoms could potentially be explained by infection, specifically urinary tract infection or pneumonia, diverticulitis, cholecystitis, pancreatitis, gastroenteritis, anemia, electrolyte abnormality, dehydration, arrhythmia, myocardial infarction, pneumonia, or progression of multiple sclerosis among other potential etiologies.        Felisa Bonier, MD 09/22/11 1724  Felisa Bonier, MD 09/22/11 [id number]  Felisa Bonier, MD 09/22/11 [phone]

## 2011-09-22 NOTE — ED Notes (Signed)
Pt here with c/o abd pain that started about 2 weeks ago and per son has become worse.  Pt has MS and per son pt has become weaker and has been eating and drinking a lot less as well over the past 2 weeks.  Pt alert and oriented x4.  Pt rates pain 10/10.

## 2011-09-22 NOTE — ED Notes (Signed)
Pt to CDU.

## 2011-09-22 NOTE — ED Provider Notes (Signed)
This is a 65 year old female with a significant history of multiple sclerosis, presents to the ED with abdominal pain and declining health. Patient has recently had a urinary tract infection and just finished taking the antibiotic several weeks ago. Patient has been more confused than usual. Dr. Kennon Rounds has evaluated the patient and ordered an abdominal CT scan for further evaluation and to rule out diverticulitis.  If the workup is unremarkable, but family members wants patient to be admitted, then I will call for admission due to general decline from multiple sclerosis.  8:25 PM Patient is currently in no acute distress. Her abdomen is benign. She has right-sided weakness, which is chronic. She has a dime size pressure ulcer to the right buttock.  She has urinary incontinence. Patient is very weak and unable to perform basic ADL. Patient will need admission.  10:08 PM Patient requests her nightly medication, which i'm happy to oblige.  Her lab work today reveals no acute finding. She does have an abdominal CT which shows a calcified uterine fibroid. However she does not have significant abdominal pain, and this is likely not a primary concern. I did ask her family to have her followup with an OB/GYN for further evaluation. I have discussed with the admitting team, who agrees to see the patient in the ED. The plan is to find adequate placement for further management. The patient will be admits to an observation unit, team 6, regular bed.  Fayrene Helper, PA 09/22/11 2228

## 2011-09-22 NOTE — ED Notes (Signed)
Pt also c/o abd. Pain. And is constantly belching while being triaged.

## 2011-09-22 NOTE — ED Notes (Signed)
Pt's son st's pt has multi. Complaints.  Health has been declining over past month.  Pt weak, staying in bed a lot, getting bed sores.  St's has recently had UTI finished taking antibiotics.  Also st's pt has been confused.

## 2011-09-23 ENCOUNTER — Observation Stay (HOSPITAL_COMMUNITY): Payer: Medicare Other

## 2011-09-23 ENCOUNTER — Encounter (HOSPITAL_COMMUNITY): Payer: Self-pay | Admitting: Internal Medicine

## 2011-09-23 DIAGNOSIS — K5900 Constipation, unspecified: Secondary | ICD-10-CM | POA: Diagnosis present

## 2011-09-23 MED ORDER — ONDANSETRON HCL 4 MG PO TABS: 4.0000 mg | ORAL_TABLET | Freq: Four times a day (QID) | ORAL | Status: DC | PRN

## 2011-09-23 MED ORDER — IBUPROFEN 400 MG PO TABS
400.0000 mg | ORAL_TABLET | Freq: Four times a day (QID) | ORAL | Status: DC | PRN
  Administered 2011-09-23 – 2011-09-26 (×10): 400 mg via ORAL
  Filled 2011-09-23 (×10): qty 1

## 2011-09-23 MED ORDER — LOSARTAN POTASSIUM-HCTZ 50-12.5 MG PO TABS: 1.0000 | ORAL_TABLET | Freq: Every day | ORAL | Status: DC

## 2011-09-23 MED ORDER — HYDROCHLOROTHIAZIDE 12.5 MG PO CAPS
12.5000 mg | ORAL_CAPSULE | Freq: Every day | ORAL | Status: DC
  Administered 2011-09-23 – 2011-09-27 (×5): 12.5 mg via ORAL
  Filled 2011-09-23 (×6): qty 1

## 2011-09-23 MED ORDER — ACETAMINOPHEN 650 MG RE SUPP: 650.0000 mg | Freq: Four times a day (QID) | RECTAL | Status: DC | PRN

## 2011-09-23 MED ORDER — CHLORHEXIDINE GLUCONATE 0.12 % MT SOLN
15.0000 mL | Freq: Two times a day (BID) | OROMUCOSAL | Status: DC
  Administered 2011-09-23 – 2011-10-05 (×22): 15 mL via OROMUCOSAL
  Filled 2011-09-23 (×22): qty 15

## 2011-09-23 MED ORDER — DOCUSATE SODIUM 100 MG PO CAPS
100.0000 mg | ORAL_CAPSULE | Freq: Two times a day (BID) | ORAL | Status: DC
  Administered 2011-09-23 – 2011-10-05 (×24): 100 mg via ORAL
  Filled 2011-09-23 (×25): qty 1

## 2011-09-23 MED ORDER — LOSARTAN POTASSIUM 50 MG PO TABS
50.0000 mg | ORAL_TABLET | Freq: Every day | ORAL | Status: DC
  Administered 2011-09-23 – 2011-10-06 (×12): 50 mg via ORAL
  Filled 2011-09-23 (×15): qty 1

## 2011-09-23 MED ORDER — DONEPEZIL HCL 5 MG PO TABS
5.0000 mg | ORAL_TABLET | Freq: Every day | ORAL | Status: DC
  Administered 2011-09-23 – 2011-10-06 (×13): 5 mg via ORAL
  Filled 2011-09-23 (×14): qty 1

## 2011-09-23 MED ORDER — WHITE PETROLATUM GEL
Status: AC
  Administered 2011-09-23: 16:00:00
  Filled 2011-09-23: qty 5

## 2011-09-23 MED ORDER — SODIUM CHLORIDE 0.9 % IJ SOLN: 3.0000 mL | INTRAMUSCULAR | Status: DC | PRN

## 2011-09-23 MED ORDER — ACETAMINOPHEN 325 MG PO TABS
650.0000 mg | ORAL_TABLET | Freq: Four times a day (QID) | ORAL | Status: DC | PRN
  Administered 2011-09-24: 650 mg via ORAL
  Administered 2011-09-25: 325 mg via ORAL
  Administered 2011-09-27 – 2011-10-06 (×4): 650 mg via ORAL
  Filled 2011-09-23 (×2): qty 2
  Filled 2011-09-23: qty 1
  Filled 2011-09-23 (×3): qty 2

## 2011-09-23 MED ORDER — ONDANSETRON HCL 4 MG/2ML IJ SOLN: 4.0000 mg | Freq: Four times a day (QID) | INTRAMUSCULAR | Status: DC | PRN

## 2011-09-23 MED ORDER — FLEET ENEMA 7-19 GM/118ML RE ENEM
1.0000 | ENEMA | Freq: Once | RECTAL | Status: AC
  Administered 2011-09-23: 1 via RECTAL
  Filled 2011-09-23: qty 1

## 2011-09-23 MED ORDER — SODIUM CHLORIDE 0.9 % IJ SOLN
3.0000 mL | Freq: Two times a day (BID) | INTRAMUSCULAR | Status: DC
  Administered 2011-09-23 – 2011-10-06 (×20): 3 mL via INTRAVENOUS

## 2011-09-23 MED ORDER — BUPROPION HCL ER (XL) 300 MG PO TB24
300.0000 mg | ORAL_TABLET | Freq: Every day | ORAL | Status: DC
  Administered 2011-09-23 – 2011-10-06 (×13): 300 mg via ORAL
  Filled 2011-09-23 (×14): qty 1

## 2011-09-23 MED ORDER — LABETALOL HCL 100 MG PO TABS
100.0000 mg | ORAL_TABLET | Freq: Two times a day (BID) | ORAL | Status: DC
  Administered 2011-09-23 – 2011-10-05 (×23): 100 mg via ORAL
  Filled 2011-09-23 (×29): qty 1

## 2011-09-23 MED ORDER — BACLOFEN 10 MG PO TABS
10.0000 mg | ORAL_TABLET | Freq: Three times a day (TID) | ORAL | Status: DC
Start: 2011-09-23 — End: 2011-10-06
  Administered 2011-09-23 – 2011-10-06 (×39): 10 mg via ORAL
  Filled 2011-09-23 (×43): qty 1

## 2011-09-23 MED ORDER — ROSUVASTATIN CALCIUM 20 MG PO TABS
20.0000 mg | ORAL_TABLET | Freq: Every day | ORAL | Status: DC
  Administered 2011-09-23 – 2011-10-06 (×13): 20 mg via ORAL
  Filled 2011-09-23 (×14): qty 1

## 2011-09-23 MED ORDER — TOLTERODINE TARTRATE ER 2 MG PO CP24
2.0000 mg | ORAL_CAPSULE | Freq: Every day | ORAL | Status: DC
  Administered 2011-09-23 – 2011-10-06 (×13): 2 mg via ORAL
  Filled 2011-09-23 (×15): qty 1

## 2011-09-23 MED ORDER — SENNA 8.6 MG PO TABS
1.0000 | ORAL_TABLET | Freq: Two times a day (BID) | ORAL | Status: DC
  Administered 2011-09-23 – 2011-10-06 (×25): 8.6 mg via ORAL
  Filled 2011-09-23 (×34): qty 1

## 2011-09-23 MED ORDER — GADOBENATE DIMEGLUMINE 529 MG/ML IV SOLN
15.0000 mL | Freq: Once | INTRAVENOUS | Status: AC | PRN
  Administered 2011-09-23: 15 mL via INTRAVENOUS

## 2011-09-23 MED ORDER — BACLOFEN 10 MG PO TABS: 10.0000 mg | ORAL_TABLET | Freq: Three times a day (TID) | ORAL | Status: DC

## 2011-09-23 MED ORDER — AMOXICILLIN-POT CLAVULANATE 500-125 MG PO TABS
500.0000 mg | ORAL_TABLET | Freq: Three times a day (TID) | ORAL | Status: DC
  Administered 2011-09-23 – 2011-09-28 (×15): 500 mg via ORAL
  Filled 2011-09-23 (×18): qty 1

## 2011-09-23 MED ORDER — HEPARIN SODIUM (PORCINE) 5000 UNIT/ML IJ SOLN
5000.0000 [IU] | Freq: Three times a day (TID) | INTRAMUSCULAR | Status: DC
  Administered 2011-09-23 – 2011-10-06 (×40): 5000 [IU] via SUBCUTANEOUS
  Filled 2011-09-23 (×42): qty 1

## 2011-09-23 MED ORDER — CARBAMAZEPINE 200 MG PO TABS
200.0000 mg | ORAL_TABLET | Freq: Three times a day (TID) | ORAL | Status: DC
  Administered 2011-09-23 – 2011-10-06 (×39): 200 mg via ORAL
  Filled 2011-09-23 (×43): qty 1

## 2011-09-23 MED ORDER — ALPRAZOLAM 0.25 MG PO TABS
0.2500 mg | ORAL_TABLET | Freq: Every evening | ORAL | Status: DC | PRN
  Administered 2011-10-04: 0.25 mg via ORAL
  Filled 2011-09-23: qty 1

## 2011-09-23 MED ORDER — SODIUM CHLORIDE 0.9 % IV SOLN: 250.0000 mL | INTRAVENOUS | Status: DC | PRN

## 2011-09-23 MED ORDER — AMLODIPINE BESYLATE 10 MG PO TABS
10.0000 mg | ORAL_TABLET | Freq: Every day | ORAL | Status: DC
  Administered 2011-09-23 – 2011-10-04 (×11): 10 mg via ORAL
  Filled 2011-09-23 (×14): qty 1

## 2011-09-23 NOTE — H&P (Signed)
PCP:   Hollice Espy, MD  Neurology Dr. [name]   Chief Complaint:  Failure to thrive, decubitus ulcers, abdominal pain   HPI: 65yoF with h/o multiple sclerosis, organic brain syndrome, HTN, trigeminal neuralgia, and fibroids  presents with failure to thrive, buttock and heel ulcers, abdominal pain and large fibroid, and  son requesting more help at home.   History mostly gathered from the son [name] who is quite attentive and impressive in his care of his  mother. He states that for the past several months she is overall decompensating -- more immobile  and bedbound, more confused and mentally "wonky," developing R heel and R buttock sore, decreased  appetite and PO intake, weight loss, and having abdominal pain. Whereas a couple months ago she  was able to ambulate in the house with a walker, now she is in bed all day long, and can't move,  such that sores have developed that he has tried to address but just physically can't move her  enough to keep her off her back. Her mental status has gotten worse such that she will just repeat  the same things over and over, not answer questions appropriately. Since October, having low  midline abdominal pain, off/on, and not clearly related to eating or having BM's which have always  been irregular anyways, and he notes last BM was Monday, with incontinence (i.e. overall bowel  habits are not changed). She is also urinarily incontinent. She has lost 40 lbs since 12/2010.   They do get VNA every Wednesday, who give Avenex shot, and some type of [place] case management  program who give 10 hrs / wk, from Mon-Thurs 12-230pm, but he feels even with this help he's not  able to manage all of her needs, and is asking for more help.   In the ED, vitals were stable and normal. Chemistry panel normal, renal 16/0.68. LFT's normal.  Trop negative x1, CBC normal. UA negative. 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 CTAP was done for incidental LLQ pain while in the ED which showed large calcified mass in  the central pelvis, possibly arising from uterus but not wholly clear, felt probably due to large  degenerated posterior uterine fibroid, recommended follow up non-emergent pelvic ultrasound or  MRI.   ROS with subjective "cold" but no fevers, chills, sweats, no CP/SOB/cough or other cardiopulm  complaints. Trigeminal neuralgia pain is better since gamma knife treatment.    Past Medical History  Diagnosis Date  . Multiple sclerosis     Right sided weakness and gait disorder, bladder and bowel problems   . Hypertension   . Depression   . 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 Trigeminal neuralgia   . Dyslipidemia   . Fibroids   . Overdose of muscle relaxant     Unintentional baclofen overdose     Past Surgical History  Procedure Date  . Strabismus surgery     Right eye     Medications: HOME MEDS: Reconciled with son's typed list Prior to Admission medications   Medication Sig Start Date End Date Taking? Authorizing Provider  acetaminophen (TYLENOL) 500 MG tablet Take 500 mg by mouth every 6 (six) hours as needed. For pain.    Yes Historical Provider, MD  ALPRAZolam Prudy Feeler) 0.25 MG tablet Take 0.25 mg by mouth at bedtime as needed. For anxiety  Yes Historical Provider, MD  amLODipine (NORVASC) 5 MG tablet Take 10 mg by mouth daily.    Yes Historical Provider, MD  atorvastatin (LIPITOR) 40 MG tablet Take 40 mg by mouth daily.     Yes Historical Provider, MD  baclofen (LIORESAL) 10 MG tablet Take 10 mg by mouth 3 (three) times daily.     Yes Historical Provider, MD  buPROPion (WELLBUTRIN XL) 300 MG 24 hr tablet Take 300 mg by mouth daily.     Yes Historical Provider, MD  carbamazepine (TEGRETOL) 200 MG tablet Take 200 mg by mouth 3 (three) times daily.     Yes Historical Provider, MD  donepezil  (ARICEPT) 5 MG tablet Take 5 mg by mouth daily.    Yes Historical Provider, MD  fish oil-omega-3 fatty acids 1000 MG capsule Take 2 g by mouth 3 (three) times daily.     Yes Historical Provider, MD  interferon beta-1a (AVONEX) 30 MCG/0.5ML injection Inject 30 mcg into the muscle every 7 (seven) days.     Yes Historical Provider, MD  labetalol (NORMODYNE) 100 MG tablet Take 100 mg by mouth 2 (two) times daily.     Yes Historical Provider, MD  losartan-hydrochlorothiazide (HYZAAR) 50-12.5 MG per tablet Take 1 tablet by mouth daily.     Yes Historical Provider, MD  Multiple Vitamins-Minerals (MULTIVITAMINS THER. W/MINERALS) TABS Take 1 tablet by mouth daily.     Yes Historical Provider, MD  naproxen sodium (ANAPROX) 220 MG tablet Take 220 mg by mouth 2 (two) times daily with a meal.     Yes Historical Provider, MD  potassium chloride SA (K-DUR,KLOR-CON) 20 MEQ tablet Take 20 mEq by mouth 3 (three) times daily.     Yes Historical Provider, MD  tolterodine (DETROL LA) 2 MG 24 hr capsule Take 2 mg by mouth daily.     Yes Historical Provider, MD  traMADol (ULTRAM) 50 MG tablet Take 50 mg by mouth every 6 (six) hours as needed. Maximum dose= 8 tablets per day. For pain     Yes Historical Provider, MD    Allergies:  Allergies  Allergen Reactions  . Shellfish Allergy     Hives     Social History:   reports that she has never smoked. She does not have any smokeless tobacco history on file. She reports that she does not drink alcohol or use illicit drugs. Divorced, has a son and daughter. [name] is very involved in her care. [id number] and [phone]  Family History: Family History  Problem Relation Age of Onset  . Hypertension    . Heart disease    . Uterine cancer      Physical Exam: Filed Vitals:   09/22/11 2020 09/22/11 2213 09/22/11 2215 09/22/11 2359  BP: 133/62 144/58  130/57  Pulse: 82  86 94  Temp:      TempSrc:      Resp: 18     SpO2: 99%  98%    Blood pressure 130/57, pulse 94,  temperature 98.5 F (36.9 C), temperature source Oral, resp. rate 18, SpO2 98.00%.  Gen: Middle aged F laying in ED stretcher with son at bedside. No distress, appears comfortable  and well. Can't really converse, doesn't answer questions well, when asked questions stares off a  bit and then answers in one word questions that don't really answer the question HEENT: PERRL around 3mm, EOMI, sclera clear and normal, mouth normal appearing Lungs: Pt cannot sit forward to listen to lungs, so auscultated  anterolaterally, grossly clear.  Heart: S2 louder than S1, regular, not tachy, overall normal and benign Abd: Lower quadrants are firm, not overly distended but notably hard and tender to palpation. BS  are present Extrem: Cool hands and feet, but not cyanotic or cold. Bilateral radials are palpable. No BLE  edema. There is a R posterior heel lesion that is dark black/purple, it looks almost like a blood  blister, without frank skin ulceration that I can note.  Back: R buttock has ~1cm round superficial skin wound Neuro: Alert, follows conversation and attempts to converse, understands what's going on though,  moves upper extremities sponteneously. Has inverted R foot.    Labs & Imaging Results for orders placed during the hospital encounter of 09/22/11 (from the past 48 hour(s))  CBC     Status: Normal   Collection Time   09/22/11  5:39 PM      Component Value Range Comment   WBC 9.5  4.0 - 10.5 (K/uL)    RBC 3.90  3.87 - 5.11 (MIL/uL)    Hemoglobin 12.3  12.0 - 15.0 (g/dL)    HCT 16.1  09.6 - 04.5 (%)    MCV 94.4  78.0 - 100.0 (fL)    MCH 31.5  26.0 - 34.0 (pg)    MCHC 33.4  30.0 - 36.0 (g/dL)    RDW 40.9  81.1 - 91.4 (%)    Platelets 267  150 - 400 (K/uL)   DIFFERENTIAL     Status: Normal   Collection Time   09/22/11  5:39 PM      Component Value Range Comment   Neutrophils Relative 74  43 - 77 (%)    Neutro Abs 7.1  1.7 - 7.7 (K/uL)    Lymphocytes Relative 15  12 - 46 (%)     Lymphs Abs 1.4  0.7 - 4.0 (K/uL)    Monocytes Relative 11  3 - 12 (%)    Monocytes Absolute 1.0  0.1 - 1.0 (K/uL)    Eosinophils Relative 0  0 - 5 (%)    Eosinophils Absolute 0.0  0.0 - 0.7 (K/uL)    Basophils Relative 0  0 - 1 (%)    Basophils Absolute 0.0  0.0 - 0.1 (K/uL)   COMPREHENSIVE METABOLIC PANEL     Status: Abnormal   Collection Time   09/22/11  5:39 PM      Component Value Range Comment   Sodium 138  135 - 145 (mEq/L)    Potassium 3.7  3.5 - 5.1 (mEq/L)    Chloride 101  96 - 112 (mEq/L)    CO2 26  19 - 32 (mEq/L)    Glucose, Bld 148 (*) 70 - 99 (mg/dL)    BUN 16  6 - 23 (mg/dL)    Creatinine, Ser 7.82  0.50 - 1.10 (mg/dL)    Calcium 9.2  8.4 - 10.5 (mg/dL)    Total Protein 7.2  6.0 - 8.3 (g/dL)    Albumin 3.1 (*) 3.5 - 5.2 (g/dL)    AST 27  0 - 37 (U/L)    ALT 18  0 - 35 (U/L)    Alkaline Phosphatase 107  39 - 117 (U/L)    Total Bilirubin 0.4  0.3 - 1.2 (mg/dL)    GFR calc non Af Amer >90  >90 (mL/min)    GFR calc Af Amer >90  >90 (mL/min)   LIPASE, BLOOD     Status: Normal   Collection Time  09/22/11  5:39 PM      Component Value Range Comment   Lipase 21  11 - 59 (U/L)   TROPONIN I     Status: Normal   Collection Time   09/22/11  5:40 PM      Component Value Range Comment   Troponin I <0.30  <0.30 (ng/mL)   URINALYSIS, ROUTINE W REFLEX MICROSCOPIC     Status: Abnormal   Collection Time   09/22/11  7:53 PM      Component Value Range Comment   Color, Urine AMBER (*) YELLOW  BIOCHEMICALS MAY BE AFFECTED BY COLOR   APPearance CLEAR  CLEAR     Specific Gravity, Urine 1.018  1.005 - 1.030     pH 5.5  5.0 - 8.0     Glucose, UA NEGATIVE  NEGATIVE (mg/dL)    Hgb urine dipstick NEGATIVE  NEGATIVE     Bilirubin Urine NEGATIVE  NEGATIVE     Ketones, ur NEGATIVE  NEGATIVE (mg/dL)    Protein, ur NEGATIVE  NEGATIVE (mg/dL)    Urobilinogen, UA 1.0  0.0 - 1.0 (mg/dL)    Nitrite NEGATIVE  NEGATIVE     Leukocytes, UA NEGATIVE  NEGATIVE  MICROSCOPIC NOT DONE ON URINES  WITH NEGATIVE PROTEIN, BLOOD, LEUKOCYTES, NITRITE, OR GLUCOSE <1000 mg/dL.   Dg Chest 2 View  09/22/2011  *RADIOLOGY REPORT*  Clinical Data: Chest pain.  Abdominal pain.  Weakness.  CHEST - 2 VIEW  Comparison: 07/25/2009  Findings: The cardiopericardial silhouette is enlarged. The lungs are clear without focal infiltrate, edema, pneumothorax or pleural effusion.  Right paratracheal density is stable in the interval and also when comparing back to 04/04/2006.  This likely represents ectasia of large vessel anatomy. Imaged bony structures of the thorax are intact.  IMPRESSION: Stable.  No acute cardiopulmonary process.  Original Report Authenticated By: Tehila Sokolow A. MANSELL, M.D.   Ct Abdomen Pelvis W Contrast  09/22/2011  *RADIOLOGY REPORT*  Clinical Data: Left lower quadrant pain. No comparison studies available.  CT ABDOMEN AND PELVIS WITH CONTRAST  Technique:  Multidetector CT imaging of the abdomen and pelvis was performed following the standard protocol during bolus administration of intravenous contrast.  Contrast: 80mL OMNIPAQUE IOHEXOL 300 MG/ML IV SOLN  Comparison: None.  Findings: Tiny low density lesion in the dome of the liver is too small to characterize, it is probably a cyst.  The spleen is normal.  The stomach, duodenum, pancreas, gallbladder, and adrenal glands are normal.  Kidneys have normal imaging features.  No abdominal aortic aneurysm.  There is no lymphadenopathy or free fluid in the abdomen.  No evidence for bowel obstruction.  Imaging through the pelvis shows a 7.8 x 10.1 x 6.0 cm calcified mass in the central pelvis.  This appears to be arising from the uterus. No evidence for adnexal mass.  No free fluid in the pelvis.  The calcified mass generates mass effect on the bladder.  There appears to be some uterine tissue along the left anterolateral aspect of the calcified lesion.  No evidence for colonic diverticulitis.  The terminal ileum is normal.  The appendix is normal. Bone windows  reveal no worrisome lytic or sclerotic osseous lesions.  IMPRESSION: Large calcified mass in the central pelvis appears to arise from the uterus although the relationship between the uterus and this calcified mass is not well seen.  The calcified lesion is probably a large degenerated posterior uterine fibroid.  A follow up non emergent pelvic ultrasound or MRI could  be used confirm the location of this lesion.  Otherwise no acute findings in the abdomen or pelvis.  Original Report Authenticated By: Jenilee Franey A. MANSELL, M.D.    Impression Present on Admission:  .Failure to thrive .Decubitus ulcer, buttock .Heel ulceration .Fibroid uterus .Abdominal pain, lower .Multiple sclerosis .Hypertension .Organic brain syndrome .Depression  65yoF with h/o multiple sclerosis, organic brain syndrome, HTN, trigeminal neuralgia, and fibroids  presents with failure to thrive, buttock and heel ulcers, abdominal pain and large fibroid, and  son requesting more help at home.   1. Failure to thrive: Unclear to me at present whether this is due to progression of her multiple  sclerosis, depression/mood issues, or just generalized failure to thrive. She has poor mobility,  decreased appetite and PO intake, and weight loss.   - Admit observation  - Recommend Neurology consult in the am, should pt be re-imaged with MR? Pt follows with Dr.  [name] - SW consult  - OT/PT consultation  - Consider nutrition consult  2. R buttock and R heel wounds: Wound consult. They are small and not very deep, but pt immobile  so risk for progression high.   3. Abdominal pain: Most likely due to this large probably fibroid in the low pelvis. By my review  of the CT, it also appears to push the bladder forward, and I think the bladder may be quite   distended, so I've asked nursing to place a Foley to see if this helps her.   - Foley placement, UA was negative.  - Consider Gyn consultation in vs outpt.  - Conservative pain  control   4. Multiple sclerosis: Continue home baclofen, tegretol, aricept, detrol. Holding home weekly  avonex.  5. HTN: continue home Norvasc, Labetalol, hyzar  6. HL: continue home statin alternative.  7. Depression/anxiety: Continue home xanax hs, buproprion  8. Holding other non-essential meds   Regular bed, team 6 SubQ heparin  Full code, discussed  [phone] and [phone], son [name]   Other plans as per orders.  Nanie Dunkleberger 09/23/2011, 12:10 AM

## 2011-09-23 NOTE — Progress Notes (Signed)
Utilization Review Completed.Emaley Applin, Bertina T12/14/2012   

## 2011-09-23 NOTE — Consult Note (Signed)
WOC consult Note Reason for Consult: req to eval pt for wound care for sacrum/buttock and R heel pressure ulcers. Pt with history of MS and decline, adm. For FTT.  Noted pt spends most time in bed per pt, non ambulatory prior to this admission.  She reports that she is in hospital bed at home but does not know if she has air mattress in home.  Wound type: pressure ulcers x 2   Pressure Ulcer POA: Yes x 2 R heel unstageable, R buttock Stage II  Measurement: R heel 1.0cm x 2.0 cm x  0 depth.  R buttock 0.5cm x 0.5 cm x 0.2 cm, shallow partial thickness  Wound bed: R heel 100% stable dry eschar,  R buttock pale yellow wound bed  Drainage (amount, consistency, odor) None noted at either site.  Periwound: intact without problems  Dressing procedure/placement/frequency: silicone foam dressings to both, to be changed weekly and PRN.  Prevalon heel lift boot for pressure redistribution to R heel to be worn at all times and taken home for use at home to prevent further breakdown of this area. R buttock stable as well, will order air mattress for bed for pressure redistribution as pt is high risk for further breakdown.  Re consult if needed, will not follow at this time. Thanks  Tamar Miano [employer], CWOCN [phone])

## 2011-09-23 NOTE — Progress Notes (Signed)
Occupational Therapy Evaluation Patient Details Name: [name] MRN: [id number] DOB: 1946/09/03 Today's Date: 09/23/2011  Problem List:  Patient Active Problem List  Diagnoses  . Multiple sclerosis  . Hypertension  . Depression  . Organic brain syndrome  . Trigeminal neuralgia  . Fibroids  . Failure to thrive  . Decubitus ulcer, buttock  . Heel ulceration  . Fibroid uterus  . Abdominal pain, lower    Past Medical History:  Past Medical History  Diagnosis Date  . Multiple sclerosis     Right sided weakness and gait disorder, bladder and bowel problems   . Hypertension   . Depression   . 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 Trigeminal neuralgia   . Dyslipidemia   . Fibroids   . Overdose of muscle relaxant     Unintentional baclofen overdose    Past Surgical History:  Past Surgical History  Procedure Date  . Strabismus surgery     Right eye   . No past surgeries     OT Assessment/Plan/Recommendation OT Assessment Clinical Impression Statement: 65 yo female presents with FTT that could benefit from skilled Ot at SNF level OT Recommendation/Assessment: All further OT needs can be met in the next venue of care OT Recommendation Follow Up Recommendations: Skilled nursing facility Equipment Recommended: Defer to next venue Individuals Consulted Consulted and Agree with Results and Recommendations: Patient unable/family or caregiver not available OT Goals Acute Rehab OT Goals OT Goal Formulation: Patient unable to participate in goal setting  OT Evaluation Precautions/Restrictions  Precautions Precautions: Fall Restrictions Weight Bearing Restrictions: No Prior Functioning Home Living Lives With: Sheran Spine Help From: Family;Personal care attendant Type of Home: House Home Layout: One level Home Access: Stairs to enter Entrance Stairs-Rails: Can reach both Entrance Stairs-Number of Steps: 3 or 4 Bathroom Shower/Tub: Teacher, music: Standard Bathroom Accessibility: Yes How Accessible: Accessible via walker Home Adaptive Equipment: Wheelchair - manual;Walker - rolling;Tub transfer bench Prior Function Level of Independence: Needs assistance with gait;Needs assistance with homemaking;Needs assistance with ADLs;Needs assistance with tranfers Bath: Total Toileting: Total Dressing: Maximal Grooming: Maximal Feeding: Supervision/set-up Meal Prep: Total Light Housekeeping: Total Driving: No Vocation: Retired Comments: pt reports walking without equipment, doing all ADLs independently and son only completed housework, grocery shopping. ADL ADL Eating/Feeding: Other (comment) (per RN Nehemiah Settle pt setup for meals) Where Assessed - Eating/Feeding: Edge of bed;Other (comment) (sat eob to drink cranberry juice) Lower Body Dressing: Simulated;+1 Total assistance Where Assessed - Lower Body Dressing: Supine, head of bed up (unable to reach knees) ADL Comments: Pt with strong posterior lean at eob and max v/c / max tactile cues to remain eob. Pt unaware of posterior lean and full body extension during eob sitting. pt able to sit eob min guard for ~30 seconds. pt with attention level sustained ~45 seconds-1 minute. pt pleasant and agreeable to participating with therapy. Vision/Perception  Vision - History Baseline Vision: Bifocals Patient Visual Report: No change from baseline Cognition Cognition Arousal/Alertness: Awake/alert Overall Cognitive Status: Impaired Memory: Appears impaired Memory Deficits: pt reporting history of independence though son and PT eval noted increased assistance required Orientation Level: Oriented to person;Oriented to place;Disoriented to situation;Other (Comment) (pt states "my son brought me here...for some reason") Executive Functioning: Pt. with decreased initiation.  Sensation/Coordination Coordination Gross Motor Movements are Fluid and Coordinated: Yes Fine Motor  Movements are Fluid and Coordinated: Not tested Extremity Assessment RUE Assessment RUE Assessment: Within Functional Limits LUE Assessment  LUE Assessment: Within Functional Limits Mobility  Bed Mobility Bed Mobility: Yes Rolling Right: 1: +1 Total assist;With rail (pt 30%) Right Sidelying to Sit: 1: +1 Total assist;With rails;Other (comment) (pt 30%) Right Sidelying to Sit Details (indicate cue type and reason): pt with full body extension and facilitation at hips and scapula to assist the patient incitating hip flexion . Sit to Supine - Right: 1: +1 Total assist;With rail;Other (comment) (pt 25%) Sit to Supine - Right Details (indicate cue type and reason): pt provided hand over hand for LUE to reach for rail then facilitation at scapula and bil LE to transfer into side lying. pt attempting to extend and twist with sit<>supine.  Scooting to [facility] [facility]: With rail;2: Max assist (hand over hand to place hands and be in trendelenburg) Exercises General Exercises - Lower Extremity Ankle Circles/Pumps: AROM;Both;5 reps;Seated;Limitations Ankle Circles/Pumps Limitations: Deficits on right side, right foot with weak dorsiflexors and evertors resulting in inversion positioning.  Long Arc Quad: AROM;5 reps;Both;Seated;Limitations Long [employer] Limitations: No AROM on right, pt. with history of MS.  End of Session OT - End of Session Activity Tolerance: Patient tolerated treatment well Patient left: in bed;with call bell in reach General Behavior During Session: [facility] for tasks performed Cognition: Impaired Cognitive Impairment: Patient with history of cognitive impairments.  Presented with short term memory deficits and poor executive functioning.   DEFER ALL FURTHER OT TO SKILLED NURSING FACILITY. OT TO SIGN OFF   Harrel Carina [facility] 09/23/2011, 2:41 PM  Pager: [phone]

## 2011-09-23 NOTE — Progress Notes (Signed)
Seen and agree with SPT note Jetaun Colbath Tabor, PT [phone]  

## 2011-09-23 NOTE — Progress Notes (Signed)
Full assessment placed in chart. CSW was informed that patient will stay observation status per CM. CSW spoke with patient and son regarding Medicare not covering SNF benefits without 3 day qualifying stay. CSW spoke with son regarding applying for Medicaid. CSW is signing off but is available if needed. [place], [place] [phone]

## 2011-09-23 NOTE — Progress Notes (Signed)
Subjective: Patient does not have many complains. According to the son she has been going downhill lately. Some facial pain but overall better  More abdominal pain and poor mobility with constipation.    Physical Exam: Blood pressure 97/53, pulse 98, temperature 98.2 F (36.8 C), temperature source Oral, resp. rate 18, height 5\' 4"  (1.626 m), weight 67.223 kg (148 lb 3.2 oz), SpO2 98.00%. Alert  Not oriented to place or time  Chest clear to auscultation Heart regular rate and rhythm Abdomen soft minimal diffuse tenderness, no rebound no guarding  Investigations:  No results found for this or any previous visit (from the past 240 hour(s)).   Basic Metabolic Panel:  Basename 09/22/11 1739  NA 138  K 3.7  CL 101  CO2 26  GLUCOSE 148*  BUN 16  CREATININE 0.68  CALCIUM 9.2  MG --  PHOS --   Liver Function Tests:  Basename 09/22/11 1739  AST 27  ALT 18  ALKPHOS 107  BILITOT 0.4  PROT 7.2  ALBUMIN 3.1*     CBC:  Basename 09/22/11 1739  WBC 9.5  NEUTROABS 7.1  HGB 12.3  HCT 36.8  MCV 94.4  PLT 267    Dg Chest 2 View  09/22/2011  *RADIOLOGY REPORT*  Clinical Data: Chest pain.  Abdominal pain.  Weakness.  CHEST - 2 VIEW  Comparison: 07/25/2009  Findings: The cardiopericardial silhouette is enlarged. The lungs are clear without focal infiltrate, edema, pneumothorax or pleural effusion.  Right paratracheal density is stable in the interval and also when comparing back to 04/04/2006.  This likely represents ectasia of large vessel anatomy. Imaged bony structures of the thorax are intact.  IMPRESSION: Stable.  No acute cardiopulmonary process.  Original Report Authenticated By: ERIC A. MANSELL, M.D.   Ct Abdomen Pelvis W Contrast  09/22/2011  *RADIOLOGY REPORT*  Clinical Data: Left lower quadrant pain. No comparison studies available.  CT ABDOMEN AND PELVIS WITH CONTRAST  Technique:  Multidetector CT imaging of the abdomen and pelvis was performed following the  standard protocol during bolus administration of intravenous contrast.  Contrast: 80mL OMNIPAQUE IOHEXOL 300 MG/ML IV SOLN  Comparison: None.  Findings: Tiny low density lesion in the dome of the liver is too small to characterize, it is probably a cyst.  The spleen is normal.  The stomach, duodenum, pancreas, gallbladder, and adrenal glands are normal.  Kidneys have normal imaging features.  No abdominal aortic aneurysm.  There is no lymphadenopathy or free fluid in the abdomen.  No evidence for bowel obstruction.  Imaging through the pelvis shows a 7.8 x 10.1 x 6.0 cm calcified mass in the central pelvis.  This appears to be arising from the uterus. No evidence for adnexal mass.  No free fluid in the pelvis.  The calcified mass generates mass effect on the bladder.  There appears to be some uterine tissue along the left anterolateral aspect of the calcified lesion.  No evidence for colonic diverticulitis.  The terminal ileum is normal.  The appendix is normal. Bone windows reveal no worrisome lytic or sclerotic osseous lesions.  IMPRESSION: Large calcified mass in the central pelvis appears to arise from the uterus although the relationship between the uterus and this calcified mass is not well seen.  The calcified lesion is probably a large degenerated posterior uterine fibroid.  A follow up non emergent pelvic ultrasound or MRI could be used confirm the location of this lesion.  Otherwise no acute findings in the abdomen or pelvis.  Original Report Authenticated By: ERIC A. MANSELL, M.D.      Medications:  I have reviewed the patient's current medications. Scheduled:   . amLODipine  10 mg Oral Daily  . amoxicillin-clavulanate  500 mg Oral Q8H  . baclofen  10 mg Oral To Major  . baclofen  10 mg Oral TID  . buPROPion  300 mg Oral Daily  . carbamazepine  200 mg Oral Once  . carbamazepine  200 mg Oral TID  . chlorhexidine  15 mL Mouth/Throat BID  . docusate sodium  100 mg Oral BID  . donepezil  5  mg Oral Daily  . heparin  5,000 Units Subcutaneous Q8H  . hydrochlorothiazide  12.5 mg Oral Daily  . labetalol  100 mg Oral Once  . labetalol  100 mg Oral BID  . losartan  50 mg Oral Daily  . rosuvastatin  20 mg Oral Daily  . senna  1 tablet Oral BID  . sodium chloride  1,000 mL Intravenous Once  . sodium chloride  3 mL Intravenous Q12H  . sodium phosphate  1 enema Rectal Once  . tolterodine  2 mg Oral Once  . tolterodine  2 mg Oral Daily  . DISCONTD: sodium chloride   Intravenous STAT  . DISCONTD: baclofen  10 mg Oral TID  . DISCONTD: baclofen  10 mg Oral Once  . DISCONTD: baclofen  10 mg Oral TID  . DISCONTD: losartan-hydrochlorothiazide  1 tablet Oral Daily    Impression: 65yoF with h/o multiple sclerosis, organic brain syndrome, HTN, trigeminal neuralgia, and fibroids  presents with failure to thrive, buttock and heel ulcers, abdominal pain and large fibroid, and  son requesting more help at home.  D/w with Dr. [name] and he states that the patient has been going downhill fast lately.  She also has intermittent facial pain   Principal Problem:  *Failure to thrive - multifactorial. There was some low-grade fever. Patient has extensive caries. She has been eating very little. We'll start empiric antibiotic. Will use Augmentin Active Problems:  Multiple sclerosis - will check MRI of the brain and cervical spine with intravenous contrast to see if she has a multiple sclerosis flare.  Hypertension - continue home medications  Depression  Organic brain syndrome  Decubitus ulcer, buttock it is stage II. Will continue DuoDERM  Fibroid uterus  Abdominal pain, lower - maybe due to constipation - she has rectal stool - will give enema.  May need snf placement      LOS: 1 day   Maeva Dant, MD Pager: [phone] 09/23/2011, 2:55 PM

## 2011-09-23 NOTE — Progress Notes (Signed)
CSW is aware of PT recommending SNF. Pt is currently observation status. CSW spoke with CM to determine if status will be changed to inpatient status. CSW will complete assessment with family after status is determined. CSW will continue to follow. [place], [place] [phone]

## 2011-09-23 NOTE — Progress Notes (Signed)
Physical Therapy Evaluation Patient Details Name: LILLYAUNA JENKINSON MRN: [id number] DOB: [birth year] Today's Date: 09/23/2011  Problem List:  Patient Active Problem List  Diagnoses  . Multiple sclerosis  . Hypertension  . Depression  . Organic brain syndrome  . Trigeminal neuralgia  . Fibroids  . Failure to thrive  . Decubitus ulcer, buttock  . Heel ulceration  . Fibroid uterus  . Abdominal pain, lower    Past Medical History:  Past Medical History  Diagnosis Date  . Multiple sclerosis     Right sided weakness and gait disorder, bladder and bowel problems   . Hypertension   . Depression   . 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 Trigeminal neuralgia   . Dyslipidemia   . Fibroids   . Overdose of muscle relaxant     Unintentional baclofen overdose    Past Surgical History:  Past Surgical History  Procedure Date  . Strabismus surgery     Right eye   . No past surgeries     PT Assessment/Plan/Recommendation PT Assessment Clinical Impression Statement: Pt. with decreased mobility that per chart review is near her baseline level of functioning.  Pt. also with cognitive impairments including short term memory deficits and difficulty with initiation.  Pt. also with incontinence of stool without awareness.  Pt. lives at home with her son who is looking to get more help at home, as he is trying to go back to school and work (per pt.).  Would recommend 24 hour care at home with a hospital bed and a lift to decrease burden of care, and PRAFO for right foot positioning.   If unavailable, recommend  skilled nursing facility.  PT Recommendation/Assessment: Patent does not need any further PT services No Skilled PT: Patient at baseline level of functioning;Patient will have necessary level of assist by caregiver at discharge PT Recommendation Follow Up Recommendations: 24 hour supervision/assistance;Skilled nursing facility (If necessary level of care cannot be provided at  home) Equipment Recommended: Other (comment) [facility] bed, lift, PRAFO. ) PT Goals     PT Evaluation Precautions/Restrictions    Prior Functioning  Home Living Lives With: Sheran Spine Help From: Family;Personal care attendant Type of Home: House Home Layout: One level Home Access: Stairs to enter Entrance Stairs-Rails: Can reach both Entrance Stairs-Number of Steps: 3 or 4 Bathroom Shower/Tub: Engineer, manufacturing systems: Standard Bathroom Accessibility: Yes How Accessible: Accessible via walker Home Adaptive Equipment: Wheelchair - manual;Walker - rolling;Tub transfer bench Prior Function Level of Independence: Needs assistance with gait;Needs assistance with homemaking;Needs assistance with ADLs;Needs assistance with tranfers Bath: Total Toileting: Total Dressing: Maximal Grooming: Maximal Feeding: Supervision/set-up Meal Prep: Total Light Housekeeping: Total Driving: No Vocation: Retired Comments: Patient reporting walking with walker and getting out of bed on her own at first, changing her responses throughout session.  Cognition Cognition Arousal/Alertness: Awake/alert Overall Cognitive Status: Impaired Memory: Appears impaired Memory Deficits: Pt. with varied responses given during history Orientation Level: Oriented to person;Oriented to place;Disoriented to time;Oriented to situation Executive Functioning: Pt. with decreased initiation.  Sensation/Coordination   Extremity Assessment   Mobility (including Balance) Bed Mobility Bed Mobility: Yes Rolling Right: With rail;1: +1 Total assist;Patient percentage (comment) (Pt. = 30%) Rolling Right Details (indicate cue type and reason): Max cueing for use of rails and initiation.  Patient unable to keep knees bent to assist with rolling on her own. Two trials, one for peri-care.  Rolling Left: 1: +1 Total assist;With rail;Patient percentage (comment) (  Pt. = 30%) Rolling Left Details (indicate cue type and  reason): Max cueing for use of rails and inititaion, unable to bend knees and keep them bent independently to help rolling.  One trial.  Right Sidelying to Sit: 2: Max assist;HOB flat;With rails Right Sidelying to Sit Details (indicate cue type and reason): Patient requiring increased time to perform and max assist to bring LE's to EOB and tactile cueing at hip and shoulder for initiation.   Sit to Supine - Right: 3: Mod assist;With rail;HOB flat Sit to Supine - Right Details (indicate cue type and reason): Patient needs assistance to bring bil. LE's onto bed.  Scooting to [facility]: With rail;4: Min assist Scooting to [facility] Details (indicate cue type and reason): Cues to for use of rails and for initiation, HOB in trendelenberg.   Transfers Transfers: No Ambulation/Gait Ambulation/Gait: No  Balance Balance Assessed: Yes Static Sitting Balance Static Sitting - Balance Support: Bilateral upper extremity supported Static Sitting - Level of Assistance: 4: Min assist Static Sitting - Comment/# of Minutes: 5 minutes EOB.  Exercise  General Exercises - Lower Extremity Ankle Circles/Pumps: AROM;Both;5 reps;Seated;Limitations Ankle Circles/Pumps Limitations: Deficits on right side, right foot with weak dorsiflexors and evertors resulting in inversion positioning.  Long Arc Quad: AROM;5 reps;Both;Seated;Limitations Long [employer] Limitations: No AROM on right, pt. with history of MS.  End of Session PT - End of Session Activity Tolerance: Patient tolerated treatment well Patient left: in bed;with call bell in reach Nurse Communication: Other (comment) (Mobility status for bed mobility.) General Behavior During Session: [facility] for tasks performed Cognition: Impaired Cognitive Impairment: Patient with history of cognitive impairments.  Presented with short term memory deficits and poor executive functioning.   Laney Pastor, SPT  09/23/2011, 11:57 AM

## 2011-09-24 MED ORDER — OXYCODONE-ACETAMINOPHEN 5-325 MG PO TABS
1.0000 | ORAL_TABLET | Freq: Four times a day (QID) | ORAL | Status: DC | PRN
  Administered 2011-09-24 – 2011-09-30 (×6): 2 via ORAL
  Administered 2011-10-01 – 2011-10-02 (×2): 1 via ORAL
  Administered 2011-10-03 – 2011-10-04 (×2): 2 via ORAL
  Filled 2011-09-24 (×6): qty 2
  Filled 2011-09-24 (×2): qty 1
  Filled 2011-09-24 (×3): qty 2

## 2011-09-24 NOTE — Progress Notes (Signed)
[name]  ZOX:[id number]  DOB: 08/09/1946  DOA: 09/22/2011  Subjective: Complaining of right-sided dental pain.  Objective: Weight change:   Intake/Output Summary (Last 24 hours) at 09/24/11 1431 Last data filed at 09/23/11 2222  Gross per 24 hour  Intake      0 ml  Output    450 ml  Net   -450 ml   Blood pressure 101/69, pulse 82, temperature 98.2 F (36.8 C), temperature source Oral, resp. rate 18, height 5\' 4"  (1.626 m), weight 67.223 kg (148 lb 3.2 oz), SpO2 98.00%.  Physical Exam: General: Alert, confused. not in any acute distress. HEENT: anicteric sclera, pupils reactive to light and accommodation, EOMI CVS: S1-S2 clear, no murmur rubs or gallops Chest: clear to auscultation bilaterally, no wheezing, rales or rhonchi Abdomen: soft nontender, nondistended, normal bowel sounds, no organomegaly Extremities: no cyanosis, clubbing or edema noted bilaterally  Lab Results: Basic Metabolic Panel:  Lab 09/22/11 8119  NA 138  K 3.7  CL 101  CO2 26  GLUCOSE 148*  BUN 16  CREATININE 0.68  CALCIUM 9.2  MG --  PHOS --   Liver Function Tests:  Lab 09/22/11 1739  AST 27  ALT 18  ALKPHOS 107  BILITOT 0.4  PROT 7.2  ALBUMIN 3.1*    Lab 09/22/11 1739  LIPASE 21  AMYLASE --   CBC:  Lab 09/22/11 1739  WBC 9.5  NEUTROABS 7.1  HGB 12.3  HCT 36.8  MCV 94.4  PLT 267   Cardiac Enzymes:  Lab 09/22/11 1740  CKTOTAL --  CKMB --  CKMBINDEX --  TROPONINI <0.30    Studies/Results: Dg Chest 2 View  09/22/2011  *RADIOLOGY REPORT*  Clinical Data: Chest pain.  Abdominal pain.  Weakness.  CHEST - 2 VIEW  Comparison: 07/25/2009  Findings: The cardiopericardial silhouette is enlarged. The lungs are clear without focal infiltrate, edema, pneumothorax or pleural effusion.  Right paratracheal density is stable in the interval and also when comparing back to 04/04/2006.  This likely represents ectasia of large vessel anatomy. Imaged bony structures of the thorax  are intact.  IMPRESSION: Stable.  No acute cardiopulmonary process.  Original Report Authenticated By: ERIC A. MANSELL, M.D.   Mr [name] JY Contrast  09/23/2011  *RADIOLOGY REPORT*  Clinical Data:  History multiple sclerosis.  Failure to thrive.  MRI HEAD WITHOUT AND WITH CONTRAST MRI CERVICAL SPINE WITHOUT AND WITH CONTRAST  Technique:  Multiplanar, multiecho pulse sequences of the brain and surrounding structures, and cervical spine, to include the craniocervical junction and cervicothoracic junction, were obtained without and with intravenous contrast.  Contrast: 15mL MULTIHANCE GADOBENATE DIMEGLUMINE 529 MG/ML IV SOLN  Comparison:  01/13/2011 head CT.  07/23/2009 MR cervical spine and MR brain.  MRI HEAD  Findings:  No acute infarct.  No intracranial hemorrhage.  Marked white matter type changes consistent with the patient's history of multiple sclerosis with mild progression since prior exam.  None of these areas demonstrates restricted motion or enhancement as can be seen in areas of active demyelination.  Global atrophy without hydrocephalus.  Major intracranial vascular structures are patent.  No intracranial mass or abnormal enhancement noted on this motion degraded exam.  IMPRESSION: No acute infarct.  Marked white matter type changes consistent with the patient's history of multiple sclerosis with mild progression since prior exam.  None of these areas demonstrates restricted motion or enhancement as can be seen in areas of active demyelination.  Global atrophy without hydrocephalus.  MRI CERVICAL SPINE  Findings: Motion degraded exam.  Regions of altered signal intensity involving the cord consistent with areas of demyelination involving; left aspect cervical medullary junction, right paracentral C2 level, left C3 level, centrally C4-5 level and extending from C6  to T4 level.  None of these areas demonstrates enhancement as can be seen with areas of active demyelination.  Appearance is relatively  similar to the prior exam.  C2-3:  Minimal foraminal narrowing.  C3-4:  Facet joint degenerative changes.  Mild bulge.  Mild bilateral foraminal narrowing mild spinal stenosis.  C4-5:  Shallow central protrusion.  Mild facet joint degenerative changes.  Mild spinal stenosis and bilateral foraminal narrowing.  C5-6:  Mild bulge greater to the right.  Mild spinal stenosis. Mild bilateral foraminal narrowing.  C6-7:  Minimal bulge and spur.  Mild bilateral foraminal narrowing.  C7-T1:  Negative.  IMPRESSION: Motion degraded exam.  Areas of chronic demyelination involving the cervical cord and thoracic cord appear relatively similar to the prior exam.  Cervical spondylotic changes without significant change.  Please see above.  Original Report Authenticated By: Fuller Canada, M.D.   Mr Cervical Spine W Wo Contrast  09/23/2011  *RADIOLOGY REPORT*  Clinical Data:  History multiple sclerosis.  Failure to thrive.  MRI HEAD WITHOUT AND WITH CONTRAST MRI CERVICAL SPINE WITHOUT AND WITH CONTRAST  Technique:  Multiplanar, multiecho pulse sequences of the brain and surrounding structures, and cervical spine, to include the craniocervical junction and cervicothoracic junction, were obtained without and with intravenous contrast.  Contrast: 15mL MULTIHANCE GADOBENATE DIMEGLUMINE 529 MG/ML IV SOLN  Comparison:  01/13/2011 head CT.  07/23/2009 MR cervical spine and MR brain.  MRI HEAD  Findings:  No acute infarct.  No intracranial hemorrhage.  Marked white matter type changes consistent with the patient's history of multiple sclerosis with mild progression since prior exam.  None of these areas demonstrates restricted motion or enhancement as can be seen in areas of active demyelination.  Global atrophy without hydrocephalus.  Major intracranial vascular structures are patent.  No intracranial mass or abnormal enhancement noted on this motion degraded exam.  IMPRESSION: No acute infarct.  Marked white matter type changes  consistent with the patient's history of multiple sclerosis with mild progression since prior exam.  None of these areas demonstrates restricted motion or enhancement as can be seen in areas of active demyelination.  Global atrophy without hydrocephalus.  MRI CERVICAL SPINE  Findings: Motion degraded exam.  Regions of altered signal intensity involving the cord consistent with areas of demyelination involving; left aspect cervical medullary junction, right paracentral C2 level, left C3 level, centrally C4-5 level and extending from C6  to T4 level.  None of these areas demonstrates enhancement as can be seen with areas of active demyelination.  Appearance is relatively similar to the prior exam.  C2-3:  Minimal foraminal narrowing.  C3-4:  Facet joint degenerative changes.  Mild bulge.  Mild bilateral foraminal narrowing mild spinal stenosis.  C4-5:  Shallow central protrusion.  Mild facet joint degenerative changes.  Mild spinal stenosis and bilateral foraminal narrowing.  C5-6:  Mild bulge greater to the right.  Mild spinal stenosis. Mild bilateral foraminal narrowing.  C6-7:  Minimal bulge and spur.  Mild bilateral foraminal narrowing.  C7-T1:  Negative.  IMPRESSION: Motion degraded exam.  Areas of chronic demyelination involving the cervical cord and thoracic cord appear relatively similar to the prior exam.  Cervical spondylotic changes without significant change.  Please see above.  Original Report Authenticated By: Almedia Balls  Constance Goltz, M.D.   Ct Abdomen Pelvis W Contrast  09/22/2011  *RADIOLOGY REPORT*  Clinical Data: Left lower quadrant pain. No comparison studies available.  CT ABDOMEN AND PELVIS WITH CONTRAST  Technique:  Multidetector CT imaging of the abdomen and pelvis was performed following the standard protocol during bolus administration of intravenous contrast.  Contrast: 80mL OMNIPAQUE IOHEXOL 300 MG/ML IV SOLN  Comparison: None.  Findings: Tiny low density lesion in the dome of the liver is too  small to characterize, it is probably a cyst.  The spleen is normal.  The stomach, duodenum, pancreas, gallbladder, and adrenal glands are normal.  Kidneys have normal imaging features.  No abdominal aortic aneurysm.  There is no lymphadenopathy or free fluid in the abdomen.  No evidence for bowel obstruction.  Imaging through the pelvis shows a 7.8 x 10.1 x 6.0 cm calcified mass in the central pelvis.  This appears to be arising from the uterus. No evidence for adnexal mass.  No free fluid in the pelvis.  The calcified mass generates mass effect on the bladder.  There appears to be some uterine tissue along the left anterolateral aspect of the calcified lesion.  No evidence for colonic diverticulitis.  The terminal ileum is normal.  The appendix is normal. Bone windows reveal no worrisome lytic or sclerotic osseous lesions.  IMPRESSION: Large calcified mass in the central pelvis appears to arise from the uterus although the relationship between the uterus and this calcified mass is not well seen.  The calcified lesion is probably a large degenerated posterior uterine fibroid.  A follow up non emergent pelvic ultrasound or MRI could be used confirm the location of this lesion.  Otherwise no acute findings in the abdomen or pelvis.  Original Report Authenticated By: ERIC A. MANSELL, M.D.    Medications: Scheduled Meds:   . amLODipine  10 mg Oral Daily  . amoxicillin-clavulanate  500 mg Oral Q8H  . baclofen  10 mg Oral TID  . buPROPion  300 mg Oral Daily  . carbamazepine  200 mg Oral TID  . chlorhexidine  15 mL Mouth/Throat BID  . docusate sodium  100 mg Oral BID  . donepezil  5 mg Oral Daily  . heparin  5,000 Units Subcutaneous Q8H  . hydrochlorothiazide  12.5 mg Oral Daily  . labetalol  100 mg Oral BID  . losartan  50 mg Oral Daily  . rosuvastatin  20 mg Oral Daily  . senna  1 tablet Oral BID  . sodium chloride  3 mL Intravenous Q12H  . sodium phosphate  1 enema Rectal Once  . tolterodine  2 mg  Oral Daily  . white petrolatum       Continuous Infusions:    Assessment/Plan:  Principal problem Failure to thrive - multifactorial. There was some low-grade fever. Patient has extensive caries and has been eating very little. I ordered dental panoramic x-ray to rule out any abscess, continue Augmentin   Active Problems:  Multiple sclerosis -MRI showed no acute infarct but marked white matter type changes consistent with patient's history of multiple sclerosis no restricted motion or enhancement. Hypertension - continue home medications  Depression  Organic brain syndrome  Decubitus ulcer, buttock it is stage II. Will continue DuoDERM  Fibroid uterus  Abdominal pain, lower - maybe due to constipation - give Dulcolax or Senokot S.  Disposition: Will likely Need snf placement       LOS: 2 days   RAI,RIPUDEEP 09/24/2011, 2:31 PM

## 2011-09-25 ENCOUNTER — Observation Stay (HOSPITAL_COMMUNITY): Payer: Medicare Other

## 2011-09-25 NOTE — Progress Notes (Signed)
[name]  YNW:[id number]  DOB: June 21, 1946  DOA: 09/22/2011  Subjective: Complaining of abdominal discomfort  Objective: Weight change:   Intake/Output Summary (Last 24 hours) at 09/25/11 1328 Last data filed at 09/25/11 0900  Gross per 24 hour  Intake      0 ml  Output    675 ml  Net   -675 ml   Blood pressure 130/66, pulse 96, temperature 99.6 F (37.6 C), temperature source Oral, resp. rate 17, height 5\' 4"  (1.626 m), weight 67.223 kg (148 lb 3.2 oz), SpO2 100.00%.  Physical Exam: General: Alert, confused. not in any acute distress. HEENT: anicteric sclera, pupils reactive to light and accommodation, EOMI CVS: S1-S2 clear, no murmur rubs or gallops Chest: clear to auscultation bilaterally, no wheezing, rales or rhonchi Abdomen: soft nontender, nondistended, normal bowel sounds, no organomegaly Extremities: no cyanosis, clubbing or edema noted bilaterally  Lab Results: Basic Metabolic Panel:  Lab 09/22/11 6578  NA 138  K 3.7  CL 101  CO2 26  GLUCOSE 148*  BUN 16  CREATININE 0.68  CALCIUM 9.2  MG --  PHOS --   Liver Function Tests:  Lab 09/22/11 1739  AST 27  ALT 18  ALKPHOS 107  BILITOT 0.4  PROT 7.2  ALBUMIN 3.1*    Lab 09/22/11 1739  LIPASE 21  AMYLASE --   CBC:  Lab 09/22/11 1739  WBC 9.5  NEUTROABS 7.1  HGB 12.3  HCT 36.8  MCV 94.4  PLT 267   Cardiac Enzymes:  Lab 09/22/11 1740  CKTOTAL --  CKMB --  CKMBINDEX --  TROPONINI <0.30    Studies/Results: Dg Chest 2 View  09/22/2011  *RADIOLOGY REPORT*  Clinical Data: Chest pain.  Abdominal pain.  Weakness.  CHEST - 2 VIEW  Comparison: 07/25/2009  Findings: The cardiopericardial silhouette is enlarged. The lungs are clear without focal infiltrate, edema, pneumothorax or pleural effusion.  Right paratracheal density is stable in the interval and also when comparing back to 04/04/2006.  This likely represents ectasia of large vessel anatomy. Imaged bony structures of the thorax are  intact.  IMPRESSION: Stable.  No acute cardiopulmonary process.  Original Report Authenticated By: ERIC A. MANSELL, M.D.   Mr [name] IO Contrast  09/23/2011  *RADIOLOGY REPORT*  Clinical Data:  History multiple sclerosis.  Failure to thrive.  MRI HEAD WITHOUT AND WITH CONTRAST MRI CERVICAL SPINE WITHOUT AND WITH CONTRAST  Technique:  Multiplanar, multiecho pulse sequences of the brain and surrounding structures, and cervical spine, to include the craniocervical junction and cervicothoracic junction, were obtained without and with intravenous contrast.  Contrast: 15mL MULTIHANCE GADOBENATE DIMEGLUMINE 529 MG/ML IV SOLN  Comparison:  01/13/2011 head CT.  07/23/2009 MR cervical spine and MR brain.  MRI HEAD  Findings:  No acute infarct.  No intracranial hemorrhage.  Marked white matter type changes consistent with the patient's history of multiple sclerosis with mild progression since prior exam.  None of these areas demonstrates restricted motion or enhancement as can be seen in areas of active demyelination.  Global atrophy without hydrocephalus.  Major intracranial vascular structures are patent.  No intracranial mass or abnormal enhancement noted on this motion degraded exam.  IMPRESSION: No acute infarct.  Marked white matter type changes consistent with the patient's history of multiple sclerosis with mild progression since prior exam.  None of these areas demonstrates restricted motion or enhancement as can be seen in areas of active demyelination.  Global atrophy without hydrocephalus.  MRI CERVICAL SPINE  Findings: Motion degraded exam.  Regions of altered signal intensity involving the cord consistent with areas of demyelination involving; left aspect cervical medullary junction, right paracentral C2 level, left C3 level, centrally C4-5 level and extending from C6  to T4 level.  None of these areas demonstrates enhancement as can be seen with areas of active demyelination.  Appearance is relatively  similar to the prior exam.  C2-3:  Minimal foraminal narrowing.  C3-4:  Facet joint degenerative changes.  Mild bulge.  Mild bilateral foraminal narrowing mild spinal stenosis.  C4-5:  Shallow central protrusion.  Mild facet joint degenerative changes.  Mild spinal stenosis and bilateral foraminal narrowing.  C5-6:  Mild bulge greater to the right.  Mild spinal stenosis. Mild bilateral foraminal narrowing.  C6-7:  Minimal bulge and spur.  Mild bilateral foraminal narrowing.  C7-T1:  Negative.  IMPRESSION: Motion degraded exam.  Areas of chronic demyelination involving the cervical cord and thoracic cord appear relatively similar to the prior exam.  Cervical spondylotic changes without significant change.  Please see above.  Original Report Authenticated By: Fuller Canada, M.D.   Mr Cervical Spine W Wo Contrast  09/23/2011  *RADIOLOGY REPORT*  Clinical Data:  History multiple sclerosis.  Failure to thrive.  MRI HEAD WITHOUT AND WITH CONTRAST MRI CERVICAL SPINE WITHOUT AND WITH CONTRAST  Technique:  Multiplanar, multiecho pulse sequences of the brain and surrounding structures, and cervical spine, to include the craniocervical junction and cervicothoracic junction, were obtained without and with intravenous contrast.  Contrast: 15mL MULTIHANCE GADOBENATE DIMEGLUMINE 529 MG/ML IV SOLN  Comparison:  01/13/2011 head CT.  07/23/2009 MR cervical spine and MR brain.  MRI HEAD  Findings:  No acute infarct.  No intracranial hemorrhage.  Marked white matter type changes consistent with the patient's history of multiple sclerosis with mild progression since prior exam.  None of these areas demonstrates restricted motion or enhancement as can be seen in areas of active demyelination.  Global atrophy without hydrocephalus.  Major intracranial vascular structures are patent.  No intracranial mass or abnormal enhancement noted on this motion degraded exam.  IMPRESSION: No acute infarct.  Marked white matter type changes  consistent with the patient's history of multiple sclerosis with mild progression since prior exam.  None of these areas demonstrates restricted motion or enhancement as can be seen in areas of active demyelination.  Global atrophy without hydrocephalus.  MRI CERVICAL SPINE  Findings: Motion degraded exam.  Regions of altered signal intensity involving the cord consistent with areas of demyelination involving; left aspect cervical medullary junction, right paracentral C2 level, left C3 level, centrally C4-5 level and extending from C6  to T4 level.  None of these areas demonstrates enhancement as can be seen with areas of active demyelination.  Appearance is relatively similar to the prior exam.  C2-3:  Minimal foraminal narrowing.  C3-4:  Facet joint degenerative changes.  Mild bulge.  Mild bilateral foraminal narrowing mild spinal stenosis.  C4-5:  Shallow central protrusion.  Mild facet joint degenerative changes.  Mild spinal stenosis and bilateral foraminal narrowing.  C5-6:  Mild bulge greater to the right.  Mild spinal stenosis. Mild bilateral foraminal narrowing.  C6-7:  Minimal bulge and spur.  Mild bilateral foraminal narrowing.  C7-T1:  Negative.  IMPRESSION: Motion degraded exam.  Areas of chronic demyelination involving the cervical cord and thoracic cord appear relatively similar to the prior exam.  Cervical spondylotic changes without significant change.  Please see above.  Original Report Authenticated By: Almedia Balls  Constance Goltz, M.D.   Ct Abdomen Pelvis W Contrast  09/22/2011  *RADIOLOGY REPORT*  Clinical Data: Left lower quadrant pain. No comparison studies available.  CT ABDOMEN AND PELVIS WITH CONTRAST  Technique:  Multidetector CT imaging of the abdomen and pelvis was performed following the standard protocol during bolus administration of intravenous contrast.  Contrast: 80mL OMNIPAQUE IOHEXOL 300 MG/ML IV SOLN  Comparison: None.  Findings: Tiny low density lesion in the dome of the liver is too  small to characterize, it is probably a cyst.  The spleen is normal.  The stomach, duodenum, pancreas, gallbladder, and adrenal glands are normal.  Kidneys have normal imaging features.  No abdominal aortic aneurysm.  There is no lymphadenopathy or free fluid in the abdomen.  No evidence for bowel obstruction.  Imaging through the pelvis shows a 7.8 x 10.1 x 6.0 cm calcified mass in the central pelvis.  This appears to be arising from the uterus. No evidence for adnexal mass.  No free fluid in the pelvis.  The calcified mass generates mass effect on the bladder.  There appears to be some uterine tissue along the left anterolateral aspect of the calcified lesion.  No evidence for colonic diverticulitis.  The terminal ileum is normal.  The appendix is normal. Bone windows reveal no worrisome lytic or sclerotic osseous lesions.  IMPRESSION: Large calcified mass in the central pelvis appears to arise from the uterus although the relationship between the uterus and this calcified mass is not well seen.  The calcified lesion is probably a large degenerated posterior uterine fibroid.  A follow up non emergent pelvic ultrasound or MRI could be used confirm the location of this lesion.  Otherwise no acute findings in the abdomen or pelvis.  Original Report Authenticated By: ERIC A. MANSELL, M.D.    Medications: Scheduled Meds:    . amLODipine  10 mg Oral Daily  . amoxicillin-clavulanate  500 mg Oral Q8H  . baclofen  10 mg Oral TID  . buPROPion  300 mg Oral Daily  . carbamazepine  200 mg Oral TID  . chlorhexidine  15 mL Mouth/Throat BID  . docusate sodium  100 mg Oral BID  . donepezil  5 mg Oral Daily  . heparin  5,000 Units Subcutaneous Q8H  . hydrochlorothiazide  12.5 mg Oral Daily  . labetalol  100 mg Oral BID  . losartan  50 mg Oral Daily  . rosuvastatin  20 mg Oral Daily  . senna  1 tablet Oral BID  . sodium chloride  3 mL Intravenous Q12H  . tolterodine  2 mg Oral Daily   Continuous Infusions:     Assessment/Plan:  Principal problem Failure to thrive - multifactorial. There was some low-grade fever. Patient has extensive caries and has been eating very little. - continue Augmentin   Active Problems:  Multiple sclerosis -MRI showed no acute infarct but marked white matter type changes consistent with patient's history of multiple sclerosis no restricted motion or enhancement. Hypertension - continue home medications  Depression  Organic brain syndrome  Decubitus ulcer, buttock it is stage II. Will continue DuoDERM  Fibroid uterus  Abdominal pain, lower - maybe due to constipation - give Dulcolax or Senokot S.  Disposition: Will likely Need snf placement       LOS: 3 days   RAI,RIPUDEEP 09/25/2011, 1:28 PM

## 2011-09-25 NOTE — Progress Notes (Signed)
Orthopantogram not done,d/t pt. Immobility.X-Ray questioned possible CT Scan.

## 2011-09-26 ENCOUNTER — Observation Stay (HOSPITAL_COMMUNITY): Payer: Medicare Other

## 2011-09-26 NOTE — Progress Notes (Signed)
Unable to obtain panorex due to pt. disability.  Please cancel that order and consider CT per radiology recommendation.  Pt. remains extremely uncomfortable due to mouth pain and is unable to eat because of pain.  Please consider ordering Ensure and diet consult.  Would dental consult while pt is here be beneficial?

## 2011-09-26 NOTE — Progress Notes (Signed)
[name]  ZOX:[id number]  DOB: 02-14-1946  DOA: 09/22/2011  Subjective: No specific complaints today  Objective: Weight change:   Intake/Output Summary (Last 24 hours) at 09/26/11 1618 Last data filed at 09/26/11 1300  Gross per 24 hour  Intake    360 ml  Output    650 ml  Net   -290 ml   Blood pressure 116/54, pulse 85, temperature 99 F (37.2 C), temperature source Oral, resp. rate 20, height 5\' 4"  (1.626 m), weight 67.223 kg (148 lb 3.2 oz), SpO2 100.00%.  Physical Exam: General: Alert, confused. not in any acute distress. HEENT: anicteric sclera, pupils reactive to light and accommodation, EOMI CVS: S1-S2 clear, no murmur rubs or gallops Chest: clear to auscultation bilaterally, no wheezing, rales or rhonchi Abdomen: soft nontender, nondistended, normal bowel sounds, no organomegaly Extremities: no cyanosis, clubbing or edema noted bilaterally  Lab Results: Basic Metabolic Panel:  Lab 09/22/11 8119  NA 138  K 3.7  CL 101  CO2 26  GLUCOSE 148*  BUN 16  CREATININE 0.68  CALCIUM 9.2  MG --  PHOS --   Liver Function Tests:  Lab 09/22/11 1739  AST 27  ALT 18  ALKPHOS 107  BILITOT 0.4  PROT 7.2  ALBUMIN 3.1*    Lab 09/22/11 1739  LIPASE 21  AMYLASE --   CBC:  Lab 09/22/11 1739  WBC 9.5  NEUTROABS 7.1  HGB 12.3  HCT 36.8  MCV 94.4  PLT 267   Cardiac Enzymes:  Lab 09/22/11 1740  CKTOTAL --  CKMB --  CKMBINDEX --  TROPONINI <0.30    Studies/Results: Dg Chest 2 View  09/22/2011  *RADIOLOGY REPORT*  Clinical Data: Chest pain.  Abdominal pain.  Weakness.  CHEST - 2 VIEW  Comparison: 07/25/2009  Findings: The cardiopericardial silhouette is enlarged. The lungs are clear without focal infiltrate, edema, pneumothorax or pleural effusion.  Right paratracheal density is stable in the interval and also when comparing back to 04/04/2006.  This likely represents ectasia of large vessel anatomy. Imaged bony structures of the thorax are intact.   IMPRESSION: Stable.  No acute cardiopulmonary process.  Original Report Authenticated By: ERIC A. MANSELL, M.D.   Mr [name] JY Contrast  09/23/2011  *RADIOLOGY REPORT*  Clinical Data:  History multiple sclerosis.  Failure to thrive.  MRI HEAD WITHOUT AND WITH CONTRAST MRI CERVICAL SPINE WITHOUT AND WITH CONTRAST  Technique:  Multiplanar, multiecho pulse sequences of the brain and surrounding structures, and cervical spine, to include the craniocervical junction and cervicothoracic junction, were obtained without and with intravenous contrast.  Contrast: 15mL MULTIHANCE GADOBENATE DIMEGLUMINE 529 MG/ML IV SOLN  Comparison:  01/13/2011 head CT.  07/23/2009 MR cervical spine and MR brain.  MRI HEAD  Findings:  No acute infarct.  No intracranial hemorrhage.  Marked white matter type changes consistent with the patient's history of multiple sclerosis with mild progression since prior exam.  None of these areas demonstrates restricted motion or enhancement as can be seen in areas of active demyelination.  Global atrophy without hydrocephalus.  Major intracranial vascular structures are patent.  No intracranial mass or abnormal enhancement noted on this motion degraded exam.  IMPRESSION: No acute infarct.  Marked white matter type changes consistent with the patient's history of multiple sclerosis with mild progression since prior exam.  None of these areas demonstrates restricted motion or enhancement as can be seen in areas of active demyelination.  Global atrophy without hydrocephalus.  MRI CERVICAL SPINE  Findings: Motion  degraded exam.  Regions of altered signal intensity involving the cord consistent with areas of demyelination involving; left aspect cervical medullary junction, right paracentral C2 level, left C3 level, centrally C4-5 level and extending from C6  to T4 level.  None of these areas demonstrates enhancement as can be seen with areas of active demyelination.  Appearance is relatively similar to the  prior exam.  C2-3:  Minimal foraminal narrowing.  C3-4:  Facet joint degenerative changes.  Mild bulge.  Mild bilateral foraminal narrowing mild spinal stenosis.  C4-5:  Shallow central protrusion.  Mild facet joint degenerative changes.  Mild spinal stenosis and bilateral foraminal narrowing.  C5-6:  Mild bulge greater to the right.  Mild spinal stenosis. Mild bilateral foraminal narrowing.  C6-7:  Minimal bulge and spur.  Mild bilateral foraminal narrowing.  C7-T1:  Negative.  IMPRESSION: Motion degraded exam.  Areas of chronic demyelination involving the cervical cord and thoracic cord appear relatively similar to the prior exam.  Cervical spondylotic changes without significant change.  Please see above.  Original Report Authenticated By: Fuller Canada, M.D.   Mr Cervical Spine W Wo Contrast  09/23/2011  *RADIOLOGY REPORT*  Clinical Data:  History multiple sclerosis.  Failure to thrive.  MRI HEAD WITHOUT AND WITH CONTRAST MRI CERVICAL SPINE WITHOUT AND WITH CONTRAST  Technique:  Multiplanar, multiecho pulse sequences of the brain and surrounding structures, and cervical spine, to include the craniocervical junction and cervicothoracic junction, were obtained without and with intravenous contrast.  Contrast: 15mL MULTIHANCE GADOBENATE DIMEGLUMINE 529 MG/ML IV SOLN  Comparison:  01/13/2011 head CT.  07/23/2009 MR cervical spine and MR brain.  MRI HEAD  Findings:  No acute infarct.  No intracranial hemorrhage.  Marked white matter type changes consistent with the patient's history of multiple sclerosis with mild progression since prior exam.  None of these areas demonstrates restricted motion or enhancement as can be seen in areas of active demyelination.  Global atrophy without hydrocephalus.  Major intracranial vascular structures are patent.  No intracranial mass or abnormal enhancement noted on this motion degraded exam.  IMPRESSION: No acute infarct.  Marked white matter type changes consistent with the  patient's history of multiple sclerosis with mild progression since prior exam.  None of these areas demonstrates restricted motion or enhancement as can be seen in areas of active demyelination.  Global atrophy without hydrocephalus.  MRI CERVICAL SPINE  Findings: Motion degraded exam.  Regions of altered signal intensity involving the cord consistent with areas of demyelination involving; left aspect cervical medullary junction, right paracentral C2 level, left C3 level, centrally C4-5 level and extending from C6  to T4 level.  None of these areas demonstrates enhancement as can be seen with areas of active demyelination.  Appearance is relatively similar to the prior exam.  C2-3:  Minimal foraminal narrowing.  C3-4:  Facet joint degenerative changes.  Mild bulge.  Mild bilateral foraminal narrowing mild spinal stenosis.  C4-5:  Shallow central protrusion.  Mild facet joint degenerative changes.  Mild spinal stenosis and bilateral foraminal narrowing.  C5-6:  Mild bulge greater to the right.  Mild spinal stenosis. Mild bilateral foraminal narrowing.  C6-7:  Minimal bulge and spur.  Mild bilateral foraminal narrowing.  C7-T1:  Negative.  IMPRESSION: Motion degraded exam.  Areas of chronic demyelination involving the cervical cord and thoracic cord appear relatively similar to the prior exam.  Cervical spondylotic changes without significant change.  Please see above.  Original Report Authenticated By: Fuller Canada, M.D.  Ct Abdomen Pelvis W Contrast  09/22/2011  *RADIOLOGY REPORT*  Clinical Data: Left lower quadrant pain. No comparison studies available.  CT ABDOMEN AND PELVIS WITH CONTRAST  Technique:  Multidetector CT imaging of the abdomen and pelvis was performed following the standard protocol during bolus administration of intravenous contrast.  Contrast: 80mL OMNIPAQUE IOHEXOL 300 MG/ML IV SOLN  Comparison: None.  Findings: Tiny low density lesion in the dome of the liver is too small to  characterize, it is probably a cyst.  The spleen is normal.  The stomach, duodenum, pancreas, gallbladder, and adrenal glands are normal.  Kidneys have normal imaging features.  No abdominal aortic aneurysm.  There is no lymphadenopathy or free fluid in the abdomen.  No evidence for bowel obstruction.  Imaging through the pelvis shows a 7.8 x 10.1 x 6.0 cm calcified mass in the central pelvis.  This appears to be arising from the uterus. No evidence for adnexal mass.  No free fluid in the pelvis.  The calcified mass generates mass effect on the bladder.  There appears to be some uterine tissue along the left anterolateral aspect of the calcified lesion.  No evidence for colonic diverticulitis.  The terminal ileum is normal.  The appendix is normal. Bone windows reveal no worrisome lytic or sclerotic osseous lesions.  IMPRESSION: Large calcified mass in the central pelvis appears to arise from the uterus although the relationship between the uterus and this calcified mass is not well seen.  The calcified lesion is probably a large degenerated posterior uterine fibroid.  A follow up non emergent pelvic ultrasound or MRI could be used confirm the location of this lesion.  Otherwise no acute findings in the abdomen or pelvis.  Original Report Authenticated By: ERIC A. MANSELL, M.D.    Medications: Scheduled Meds:    . amLODipine  10 mg Oral Daily  . amoxicillin-clavulanate  500 mg Oral Q8H  . baclofen  10 mg Oral TID  . buPROPion  300 mg Oral Daily  . carbamazepine  200 mg Oral TID  . chlorhexidine  15 mL Mouth/Throat BID  . docusate sodium  100 mg Oral BID  . donepezil  5 mg Oral Daily  . heparin  5,000 Units Subcutaneous Q8H  . hydrochlorothiazide  12.5 mg Oral Daily  . labetalol  100 mg Oral BID  . losartan  50 mg Oral Daily  . rosuvastatin  20 mg Oral Daily  . senna  1 tablet Oral BID  . sodium chloride  3 mL Intravenous Q12H  . tolterodine  2 mg Oral Daily   Continuous Infusions:     Assessment/Plan:  Principal problem Failure to thrive - multifactorial. There was some low-grade fever. Patient has extensive caries and has been eating very little. - continue Augmentin. Per RN patient has difficulty feeding herself, I discussed in detail with patient's son over the phone and he states that he has been feeding her at home.   Active Problems:  Multiple sclerosis -MRI showed no acute infarct but marked white matter type changes consistent with patient's history of multiple sclerosis no restricted motion or enhancement. Hypertension - continue home medications  Depression  Organic brain syndrome  Decubitus ulcer, buttock it is stage II. Will continue DuoDERM  Fibroid uterus  Abdominal pain, lower - maybe due to constipation - give Dulcolax or Senokot S.  Disposition: Discussed with Child psychotherapist and case management, unfortunately patient does not qualify for skilled nursing facility. I discussed in detail with patient's son over  the phone, he stated that he is at home 24/7 and patient has home health aide for 12 hours/week. Discussed in with case management to maximize home health benefits for the patient and a social worker to reassess home situation if she can qualify for skilled nursing facility in near future. Per request of patient's son, DC tomorrow to home.     LOS: 4 days   [name] 09/26/2011, 4:18 PM

## 2011-09-26 NOTE — Progress Notes (Signed)
Utilization Review Completed.Sanyia Dini, Ariella T12/17/2012   

## 2011-09-27 ENCOUNTER — Observation Stay (HOSPITAL_COMMUNITY): Payer: Medicare Other

## 2011-09-27 LAB — CBC
Hemoglobin: 10.1 g/dL — ABNORMAL LOW (ref 12.0–15.0)
RBC: 3.19 MIL/uL — ABNORMAL LOW (ref 3.87–5.11)
WBC: 10.6 10*3/uL — ABNORMAL HIGH (ref 4.0–10.5)

## 2011-09-27 LAB — DIFFERENTIAL
Lymphs Abs: 1.3 10*3/uL (ref 0.7–4.0)
Monocytes Relative: 9 % (ref 3–12)
Neutro Abs: 8.3 10*3/uL — ABNORMAL HIGH (ref 1.7–7.7)
Neutrophils Relative %: 78 % — ABNORMAL HIGH (ref 43–77)

## 2011-09-27 LAB — COMPREHENSIVE METABOLIC PANEL
BUN: 9 mg/dL (ref 6–23)
CO2: 31 mEq/L (ref 19–32)
Chloride: 92 mEq/L — ABNORMAL LOW (ref 96–112)
Creatinine, Ser: 0.54 mg/dL (ref 0.50–1.10)
GFR calc non Af Amer: >90 mL/min (ref >90)
Total Bilirubin: 0.4 mg/dL (ref 0.3–1.2)

## 2011-09-27 LAB — AMYLASE: Amylase: 44 U/L (ref 0–105)

## 2011-09-27 LAB — LIPASE, BLOOD: Lipase: 13 U/L (ref 11–59)

## 2011-09-27 MED ORDER — ADULT MULTIVITAMIN W/MINERALS CH
1.0000 | ORAL_TABLET | Freq: Every day | ORAL | Status: DC
  Administered 2011-09-27 – 2011-10-06 (×9): 1 via ORAL
  Filled 2011-09-27 (×10): qty 1

## 2011-09-27 MED ORDER — IOHEXOL 300 MG/ML  SOLN
80.0000 mL | Freq: Once | INTRAMUSCULAR | Status: AC | PRN
  Administered 2011-09-27: 80 mL via INTRAVENOUS

## 2011-09-27 MED ORDER — POLYETHYLENE GLYCOL 3350 17 G PO PACK
17.0000 g | PACK | Freq: Two times a day (BID) | ORAL | Status: DC
  Administered 2011-09-27 – 2011-10-05 (×14): 17 g via ORAL
  Filled 2011-09-27 (×20): qty 1

## 2011-09-27 MED ORDER — ENSURE CLINICAL ST REVIGOR PO LIQD
237.0000 mL | Freq: Three times a day (TID) | ORAL | Status: DC
  Administered 2011-09-29 – 2011-10-06 (×21): 237 mL via ORAL

## 2011-09-27 MED ORDER — SODIUM CHLORIDE 0.9 % IV SOLN
INTRAVENOUS | Status: DC
  Administered 2011-09-27 – 2011-09-29 (×3): via INTRAVENOUS

## 2011-09-27 MED ORDER — MORPHINE SULFATE 2 MG/ML IJ SOLN
1.0000 mg | INTRAMUSCULAR | Status: DC | PRN
  Administered 2011-09-27 – 2011-10-04 (×11): 1 mg via INTRAVENOUS
  Filled 2011-09-27 (×13): qty 1

## 2011-09-27 NOTE — Progress Notes (Addendum)
[name]  BJY:[id number]  DOB: Jul 14, 1946  DOA: 09/22/2011  Subjective: Patient complains of Abdominal pain, per nurse no BM since the 13th.  Objective: Weight change:   Intake/Output Summary (Last 24 hours) at 09/27/11 1419 Last data filed at 09/27/11 1001  Gross per 24 hour  Intake    363 ml  Output    250 ml  Net    113 ml   Blood pressure 112/49, pulse 72, temperature 97.7 F (36.5 C), temperature source Oral, resp. rate 18, height 5\' 4"  (1.626 m), weight 67.223 kg (148 lb 3.2 oz), SpO2 95.00%.  Physical Exam: General: Alert, confused. not in any acute distress. HEENT: anicteric sclera, pupils reactive to light and accommodation, EOMI CVS: S1-S2 clear, no murmur rubs or gallops Chest: clear to auscultation bilaterally, no wheezing, rales or rhonchi Abdomen: soft nontender, nondistended, normal bowel sounds, no organomegaly Extremities: no cyanosis, clubbing or edema noted bilaterally  Lab Results: Basic Metabolic Panel:  Lab 09/22/11 0865  NA 138  K 3.7  CL 101  CO2 26  GLUCOSE 148*  BUN 16  CREATININE 0.68  CALCIUM 9.2  MG --  PHOS --   Liver Function Tests:  Lab 09/22/11 1739  AST 27  ALT 18  ALKPHOS 107  BILITOT 0.4  PROT 7.2  ALBUMIN 3.1*    Lab 09/22/11 1739  LIPASE 21  AMYLASE --   CBC:  Lab 09/22/11 1739  WBC 9.5  NEUTROABS 7.1  HGB 12.3  HCT 36.8  MCV 94.4  PLT 267   Cardiac Enzymes:  Lab 09/22/11 1740  CKTOTAL --  CKMB --  CKMBINDEX --  TROPONINI <0.30    Studies/Results: Dg Chest 2 View  09/22/2011  *RADIOLOGY REPORT*  Clinical Data: Chest pain.  Abdominal pain.  Weakness.  CHEST - 2 VIEW  Comparison: 07/25/2009  Findings: The cardiopericardial silhouette is enlarged. The lungs are clear without focal infiltrate, edema, pneumothorax or pleural effusion.  Right paratracheal density is stable in the interval and also when comparing back to 04/04/2006.  This likely represents ectasia of large vessel anatomy. Imaged  bony structures of the thorax are intact.  IMPRESSION: Stable.  No acute cardiopulmonary process.  Original Report Authenticated By: ERIC A. MANSELL, M.D.   Mr [name] HQ Contrast  09/23/2011  *RADIOLOGY REPORT*  Clinical Data:  History multiple sclerosis.  Failure to thrive.  MRI HEAD WITHOUT AND WITH CONTRAST MRI CERVICAL SPINE WITHOUT AND WITH CONTRAST  Technique:  Multiplanar, multiecho pulse sequences of the brain and surrounding structures, and cervical spine, to include the craniocervical junction and cervicothoracic junction, were obtained without and with intravenous contrast.  Contrast: 15mL MULTIHANCE GADOBENATE DIMEGLUMINE 529 MG/ML IV SOLN  Comparison:  01/13/2011 head CT.  07/23/2009 MR cervical spine and MR brain.  MRI HEAD  Findings:  No acute infarct.  No intracranial hemorrhage.  Marked white matter type changes consistent with the patient's history of multiple sclerosis with mild progression since prior exam.  None of these areas demonstrates restricted motion or enhancement as can be seen in areas of active demyelination.  Global atrophy without hydrocephalus.  Major intracranial vascular structures are patent.  No intracranial mass or abnormal enhancement noted on this motion degraded exam.  IMPRESSION: No acute infarct.  Marked white matter type changes consistent with the patient's history of multiple sclerosis with mild progression since prior exam.  None of these areas demonstrates restricted motion or enhancement as can be seen in areas of active demyelination.  Global atrophy  without hydrocephalus.  MRI CERVICAL SPINE  Findings: Motion degraded exam.  Regions of altered signal intensity involving the cord consistent with areas of demyelination involving; left aspect cervical medullary junction, right paracentral C2 level, left C3 level, centrally C4-5 level and extending from C6  to T4 level.  None of these areas demonstrates enhancement as can be seen with areas of active  demyelination.  Appearance is relatively similar to the prior exam.  C2-3:  Minimal foraminal narrowing.  C3-4:  Facet joint degenerative changes.  Mild bulge.  Mild bilateral foraminal narrowing mild spinal stenosis.  C4-5:  Shallow central protrusion.  Mild facet joint degenerative changes.  Mild spinal stenosis and bilateral foraminal narrowing.  C5-6:  Mild bulge greater to the right.  Mild spinal stenosis. Mild bilateral foraminal narrowing.  C6-7:  Minimal bulge and spur.  Mild bilateral foraminal narrowing.  C7-T1:  Negative.  IMPRESSION: Motion degraded exam.  Areas of chronic demyelination involving the cervical cord and thoracic cord appear relatively similar to the prior exam.  Cervical spondylotic changes without significant change.  Please see above.  Original Report Authenticated By: Fuller Canada, M.D.   Mr Cervical Spine W Wo Contrast  09/23/2011  *RADIOLOGY REPORT*  Clinical Data:  History multiple sclerosis.  Failure to thrive.  MRI HEAD WITHOUT AND WITH CONTRAST MRI CERVICAL SPINE WITHOUT AND WITH CONTRAST  Technique:  Multiplanar, multiecho pulse sequences of the brain and surrounding structures, and cervical spine, to include the craniocervical junction and cervicothoracic junction, were obtained without and with intravenous contrast.  Contrast: 15mL MULTIHANCE GADOBENATE DIMEGLUMINE 529 MG/ML IV SOLN  Comparison:  01/13/2011 head CT.  07/23/2009 MR cervical spine and MR brain.  MRI HEAD  Findings:  No acute infarct.  No intracranial hemorrhage.  Marked white matter type changes consistent with the patient's history of multiple sclerosis with mild progression since prior exam.  None of these areas demonstrates restricted motion or enhancement as can be seen in areas of active demyelination.  Global atrophy without hydrocephalus.  Major intracranial vascular structures are patent.  No intracranial mass or abnormal enhancement noted on this motion degraded exam.  IMPRESSION: No acute  infarct.  Marked white matter type changes consistent with the patient's history of multiple sclerosis with mild progression since prior exam.  None of these areas demonstrates restricted motion or enhancement as can be seen in areas of active demyelination.  Global atrophy without hydrocephalus.  MRI CERVICAL SPINE  Findings: Motion degraded exam.  Regions of altered signal intensity involving the cord consistent with areas of demyelination involving; left aspect cervical medullary junction, right paracentral C2 level, left C3 level, centrally C4-5 level and extending from C6  to T4 level.  None of these areas demonstrates enhancement as can be seen with areas of active demyelination.  Appearance is relatively similar to the prior exam.  C2-3:  Minimal foraminal narrowing.  C3-4:  Facet joint degenerative changes.  Mild bulge.  Mild bilateral foraminal narrowing mild spinal stenosis.  C4-5:  Shallow central protrusion.  Mild facet joint degenerative changes.  Mild spinal stenosis and bilateral foraminal narrowing.  C5-6:  Mild bulge greater to the right.  Mild spinal stenosis. Mild bilateral foraminal narrowing.  C6-7:  Minimal bulge and spur.  Mild bilateral foraminal narrowing.  C7-T1:  Negative.  IMPRESSION: Motion degraded exam.  Areas of chronic demyelination involving the cervical cord and thoracic cord appear relatively similar to the prior exam.  Cervical spondylotic changes without significant change.  Please see above.  Original Report Authenticated By: Fuller Canada, M.D.   Ct Abdomen Pelvis W Contrast  09/22/2011  *RADIOLOGY REPORT*  Clinical Data: Left lower quadrant pain. No comparison studies available.  CT ABDOMEN AND PELVIS WITH CONTRAST  Technique:  Multidetector CT imaging of the abdomen and pelvis was performed following the standard protocol during bolus administration of intravenous contrast.  Contrast: 80mL OMNIPAQUE IOHEXOL 300 MG/ML IV SOLN  Comparison: None.  Findings: Tiny low  density lesion in the dome of the liver is too small to characterize, it is probably a cyst.  The spleen is normal.  The stomach, duodenum, pancreas, gallbladder, and adrenal glands are normal.  Kidneys have normal imaging features.  No abdominal aortic aneurysm.  There is no lymphadenopathy or free fluid in the abdomen.  No evidence for bowel obstruction.  Imaging through the pelvis shows a 7.8 x 10.1 x 6.0 cm calcified mass in the central pelvis.  This appears to be arising from the uterus. No evidence for adnexal mass.  No free fluid in the pelvis.  The calcified mass generates mass effect on the bladder.  There appears to be some uterine tissue along the left anterolateral aspect of the calcified lesion.  No evidence for colonic diverticulitis.  The terminal ileum is normal.  The appendix is normal. Bone windows reveal no worrisome lytic or sclerotic osseous lesions.  IMPRESSION: Large calcified mass in the central pelvis appears to arise from the uterus although the relationship between the uterus and this calcified mass is not well seen.  The calcified lesion is probably a large degenerated posterior uterine fibroid.  A follow up non emergent pelvic ultrasound or MRI could be used confirm the location of this lesion.  Otherwise no acute findings in the abdomen or pelvis.  Original Report Authenticated By: ERIC A. MANSELL, M.D.    Medications: Scheduled Meds:    . amLODipine  10 mg Oral Daily  . amoxicillin-clavulanate  500 mg Oral Q8H  . baclofen  10 mg Oral TID  . buPROPion  300 mg Oral Daily  . carbamazepine  200 mg Oral TID  . chlorhexidine  15 mL Mouth/Throat BID  . docusate sodium  100 mg Oral BID  . donepezil  5 mg Oral Daily  . heparin  5,000 Units Subcutaneous Q8H  . hydrochlorothiazide  12.5 mg Oral Daily  . labetalol  100 mg Oral BID  . losartan  50 mg Oral Daily  . polyethylene glycol  17 g Oral BID  . rosuvastatin  20 mg Oral Daily  . senna  1 tablet Oral BID  . sodium  chloride  3 mL Intravenous Q12H  . tolterodine  2 mg Oral Daily   Continuous Infusions:    Assessment/Plan:  Principal problem Failure to thrive - multifactorial.  Continue Dysphagia diet and add Ensure TID.  Active Problems:  Multiple sclerosis -MRI showed no acute infarct but marked white matter type changes consistent with patient's history of multiple sclerosis no restricted motion or enhancement. Hypertension - continue home medications  Depression  Organic brain syndrome  Decubitus ulcer, buttock it is stage II. Will continue DuoDERM  Fibroid uterus:  Reviewed [place] report from 2002 with same result as present CT scan of the Pelvis,  Follow up OP.  Abdominal pain, lower - maybe due to constipation - give Dulcolax  Senokot S and add Mirilax.  Ordered a KUB.  Disposition: Discussed with Child psychotherapist and case management, unfortunately patient does not qualify for skilled nursing facility. I  discussed in detail with patient's son over the phone, he stated that he is at home 24/7 and patient has home health aide for 12 hours/week. Discussed in with case management to maximize home health benefits for the patient and a social worker to reassess home situation if she can qualify for skilled nursing facility in near future. Hold on discharge with constipation and continued abdominal pain. Per discussion with the nurse not much result with the enema. Discussed with patient's son.  She has an appointment to follow up with Neurology on  The 28th of December.    LOS: 5 days   Earlene Plater MD, Ladell Pier 09/27/2011, 2:19 PM

## 2011-09-27 NOTE — Progress Notes (Signed)
Utilization Review Completed.[name], Latia T12/18/2012   

## 2011-09-27 NOTE — Progress Notes (Signed)
INITIAL ADULT NUTRITION ASSESSMENT Date: 09/27/2011   Time: 3:11 PM Reason for Assessment: Braden score  ASSESSMENT: Female 65 y.o.   Patient Active Problem List  Diagnoses  . Multiple sclerosis  . Hypertension  . Depression  . Organic brain syndrome  . Trigeminal neuralgia  . Fibroids  . Failure to thrive  . Decubitus ulcer, buttock  . Heel ulceration  . Fibroid uterus  . Abdominal pain, lower  . Constipation     Hx:  Past Medical History  Diagnosis Date  . Multiple sclerosis     Right sided weakness and gait disorder, bladder and bowel problems   . Hypertension   . Depression   . 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 Trigeminal neuralgia   . Dyslipidemia   . Fibroids   . Overdose of muscle relaxant     Unintentional baclofen overdose      Scheduled Meds:    . amLODipine  10 mg Oral Daily  . amoxicillin-clavulanate  500 mg Oral Q8H  . baclofen  10 mg Oral TID  . buPROPion  300 mg Oral Daily  . carbamazepine  200 mg Oral TID  . chlorhexidine  15 mL Mouth/Throat BID  . docusate sodium  100 mg Oral BID  . donepezil  5 mg Oral Daily  . feeding supplement  237 mL Oral TID WC  . heparin  5,000 Units Subcutaneous Q8H  . hydrochlorothiazide  12.5 mg Oral Daily  . labetalol  100 mg Oral BID  . losartan  50 mg Oral Daily  . polyethylene glycol  17 g Oral BID  . rosuvastatin  20 mg Oral Daily  . senna  1 tablet Oral BID  . sodium chloride  3 mL Intravenous Q12H  . tolterodine  2 mg Oral Daily   Continuous Infusions:  PRN Meds:.sodium chloride, acetaminophen, acetaminophen, ALPRAZolam, ibuprofen, iohexol, ondansetron (ZOFRAN) IV, ondansetron, oxyCODONE-acetaminophen, sodium chloride   Ht: 5\' 4"  (162.6 cm)  Wt: 148 lb 3.2 oz (67.223 kg)  Ideal Wt: 120 lb (54.4 kg) % Ideal Wt: 123%  Usual Wt: 188 lb % Usual Wt: 79%  Body mass index is 25.44 kg/(m^2).  Consistent with overweight.  Food/Nutrition Related Hx: 40 lb weight loss reported since 12/2010.   Patient has had decreased appetite and intakes, decreased activity, and skin breakdown since October.  Shellfish allergy noted.  Patient also with dental caries and mouth pain.  Labs:  CMP     Component Value Date/Time   NA 138 09/22/2011 1739   K 3.7 09/22/2011 1739   CL 101 09/22/2011 1739   CO2 26 09/22/2011 1739   GLUCOSE 148* 09/22/2011 1739   BUN 16 09/22/2011 1739   CREATININE 0.68 09/22/2011 1739   CALCIUM 9.2 09/22/2011 1739   PROT 7.2 09/22/2011 1739   ALBUMIN 3.1* 09/22/2011 1739   AST 27 09/22/2011 1739   ALT 18 09/22/2011 1739   ALKPHOS 107 09/22/2011 1739   BILITOT 0.4 09/22/2011 1739   GFRNONAA >90 09/22/2011 1739   GFRAA >90 09/22/2011 1739     Intake/Output Summary (Last 24 hours) at 09/27/11 1511 Last data filed at 09/27/11 1001  Gross per 24 hour  Intake    363 ml  Output    250 ml  Net    113 ml      Diet Order: Dysphagia 3, thin liquids.  0-100% of meals, patient requires assistance with feeding.  Supplements/Tube Feeding:  Ensure Clinical Strength tid with meals (  350 kcal, 13 g protein each)  IVF:  NSL  Estimated Nutritional Needs:   Kcal:1565-1695 kcal/day Protein:70-85 grams/day Fluid:~1.7 L/day  NUTRITION DIAGNOSIS: -Malnutrition (NI-5.2).    RELATED TO: inadequate oral intake, chewing difficulty, catabolic illness  AS EVIDENCE BY: 21% weight loss x 8 months, diet history, development of stage 2 pressure wounds  MONITORING/EVALUATION(Goals): Pt to meet minimum estimated needs with po intake and added supplements  EDUCATION NEEDS: -No education needs identified at this time  INTERVENTION:  Continue diet and Ensure Clinical Strength as ordered  Encourage feeding assistance as needed  MVI daily  Dietitian [phone]  DOCUMENTATION CODES Per approved criteria  -Severe malnutrition in the context of chronic illness    Otto Herb 09/27/2011, 3:11 PM

## 2011-09-27 NOTE — Progress Notes (Signed)
This NP called by RN, Misty. Pt admitted for abd pain but this has worsened in the last few hours. NP to bedside. Pt has dementia, so is not a good historian. Pt repeats "stomach hurts" continuously. She is unable to clarify or rate her pain. No vomiting. Percocet/Ibuprofen have not helped tonight. Pt examined. Alert. Oriented to self. Lying in bed somewhat fidgety. Appears fairly well, not toxic. Skin warm and dry. Abd flat. BS x 4, slightly hypoactive in upper quads. Generalized tenderness noted with palpation. No distinct focal point of pain. McBurney's neg. No rebound or guarding.  One view Abd today showed possible ileus. She has been constipated and given Senna, colace ATC and an enema and Miralax today with little return.  Pt made NPO except meds. Stat labs ordered. Will rest her bowel for now. Do not see need for NGT at this time. IVF started with Morphine IV for severe pain. Will follow after labs back. Dr. Mikeal Hawthorne aware and agrees with plan.  Maren Reamer, NP [facility]

## 2011-09-28 ENCOUNTER — Observation Stay (HOSPITAL_COMMUNITY): Payer: Medicare Other

## 2011-09-28 LAB — URINALYSIS, ROUTINE W REFLEX MICROSCOPIC
Ketones, ur: 15 mg/dL — AB
Nitrite: POSITIVE — AB
Specific Gravity, Urine: 1.036 — ABNORMAL HIGH (ref 1.005–1.030)
Urobilinogen, UA: 2 mg/dL — ABNORMAL HIGH (ref 0.0–1.0)

## 2011-09-28 LAB — BASIC METABOLIC PANEL
CO2: 31 mEq/L (ref 19–32)
GFR calc Af Amer: >90 mL/min (ref >90)
GFR calc non Af Amer: >90 mL/min (ref >90)
Glucose, Bld: 102 mg/dL — ABNORMAL HIGH (ref 70–99)
Sodium: 136 mEq/L (ref 135–145)

## 2011-09-28 LAB — URINE MICROSCOPIC-ADD ON

## 2011-09-28 LAB — CARDIAC PANEL(CRET KIN+CKTOT+MB+TROPI): CK, MB: 1.2 ng/mL (ref 0.3–4.0)

## 2011-09-28 LAB — MAGNESIUM: Magnesium: 2.1 mg/dL (ref 1.5–2.5)

## 2011-09-28 MED ORDER — TRAMADOL HCL 50 MG PO TABS
50.0000 mg | ORAL_TABLET | Freq: Four times a day (QID) | ORAL | Status: DC
  Administered 2011-09-28 – 2011-10-06 (×31): 50 mg via ORAL
  Filled 2011-09-28 (×36): qty 1

## 2011-09-28 MED ORDER — POTASSIUM CHLORIDE CRYS ER 20 MEQ PO TBCR
20.0000 meq | EXTENDED_RELEASE_TABLET | Freq: Two times a day (BID) | ORAL | Status: DC
Start: 2011-09-28 — End: 2011-09-28
  Filled 2011-09-28 (×2): qty 1

## 2011-09-28 MED ORDER — VANCOMYCIN HCL 500 MG IV SOLR
500.0000 mg | Freq: Two times a day (BID) | INTRAVENOUS | Status: DC
  Administered 2011-09-29: 500 mg via INTRAVENOUS
  Filled 2011-09-28 (×3): qty 500

## 2011-09-28 MED ORDER — MORPHINE SULFATE 2 MG/ML IJ SOLN
1.0000 mg | Freq: Once | INTRAMUSCULAR | Status: AC
  Administered 2011-09-28: 1 mg via INTRAVENOUS

## 2011-09-28 MED ORDER — BISACODYL 10 MG RE SUPP: 10.0000 mg | Freq: Every day | RECTAL | Status: DC | PRN

## 2011-09-28 MED ORDER — INTERFERON BETA-1A 30 MCG/0.5ML IM KIT
30.0000 ug | PACK | INTRAMUSCULAR | Status: DC
  Administered 2011-09-28: 30 ug via INTRAMUSCULAR

## 2011-09-28 MED ORDER — POTASSIUM CHLORIDE 10 MEQ/100ML IV SOLN
10.0000 meq | INTRAVENOUS | Status: AC
  Administered 2011-09-28 (×2): 10 meq via INTRAVENOUS
  Filled 2011-09-28 (×2): qty 100

## 2011-09-28 MED ORDER — PIPERACILLIN-TAZOBACTAM 3.375 G IVPB
3.3750 g | Freq: Three times a day (TID) | INTRAVENOUS | Status: DC
  Administered 2011-09-28 – 2011-10-02 (×12): 3.375 g via INTRAVENOUS
  Filled 2011-09-28 (×13): qty 50

## 2011-09-28 MED ORDER — VANCOMYCIN HCL IN DEXTROSE 1-5 GM/200ML-% IV SOLN
1000.0000 mg | Freq: Once | INTRAVENOUS | Status: AC
  Administered 2011-09-28: 1000 mg via INTRAVENOUS
  Filled 2011-09-28: qty 200

## 2011-09-28 MED ORDER — POTASSIUM CHLORIDE CRYS ER 20 MEQ PO TBCR
40.0000 meq | EXTENDED_RELEASE_TABLET | Freq: Three times a day (TID) | ORAL | Status: DC
  Administered 2011-09-28 – 2011-10-01 (×9): 40 meq via ORAL
  Filled 2011-09-28 (×10): qty 2

## 2011-09-28 MED ORDER — POTASSIUM CHLORIDE 10 MEQ/100ML IV SOLN
10.0000 meq | INTRAVENOUS | Status: AC
  Administered 2011-09-28 (×2): 10 meq via INTRAVENOUS
  Filled 2011-09-28: qty 100

## 2011-09-28 NOTE — Progress Notes (Signed)
ANTIBIOTIC CONSULT NOTE - INITIAL  Pharmacy Consult for Vancomycin, Zosyn Indication: rule out pneumonia  Allergies  Allergen Reactions  . Shellfish Allergy     Hives     Patient Measurements: Height: 5\' 4"  (162.6 cm) Weight: 148 lb 3.2 oz (67.223 kg) IBW/kg (Calculated) : 54.7  Adjusted Body Weight:   Vital Signs: Temp: 98.1 F (36.7 C) (12/19 0603) Temp src: Axillary (12/19 0603) BP: 136/50 mmHg (12/19 0600) Pulse Rate: 90  (12/19 0600) Intake/Output from previous day: 12/18 0701 - 12/19 0700 In: 709.3 [I.V.:509.3; IV Piggyback:200] Out: 925 [Urine:925] Intake/Output from this shift:    Labs:  Basename 09/28/11 0533 09/27/11 2003  WBC -- 10.6*  HGB -- 10.1*  PLT -- 382  LABCREA -- --  CREATININE 0.50 0.54   Estimated Creatinine Clearance: 67.8 ml/min (by C-G formula based on Cr of 0.5). No results found for this basename: VANCOTROUGH:2,VANCOPEAK:2,VANCORANDOM:2,GENTTROUGH:2,GENTPEAK:2,GENTRANDOM:2,TOBRATROUGH:2,TOBRAPEAK:2,TOBRARND:2,AMIKACINPEAK:2,AMIKACINTROU:2,AMIKACIN:2, in the last 72 hours   Microbiology: No results found for this or any previous visit (from the past 720 hour(s)).  Medical History: Past Medical History  Diagnosis Date  . Multiple sclerosis     Right sided weakness and gait disorder, bladder and bowel problems   . Hypertension   . Depression   . 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 Trigeminal neuralgia   . Dyslipidemia   . Fibroids   . Overdose of muscle relaxant     Unintentional baclofen overdose      Assessment: 65yoF with decubitus ulcers on augmentin to be started on Vancomycin and Zosyn for suspected PNA.  WBC 10.6, Tm24h: 101.8.  Renal function stable, CrCl estimated ~73ml/min.  12/19 Blood and urine cultures pending.    Goal of Therapy:  Vancomycin trough level 15-20 mcg/ml  Plan:  1. D/C Augmentin. 2. Vancomycin loading dose 1gram IV x 1 3. Vancomycin maintenance dose 500mg  IV q 12 h 4. Zosyn 3.375g IV q 8 h  (4 hour infusion) 5. Follow vancomycin trough at steady state, follow renal function, T, WBC, cultures, clinical course.    [name] E 09/28/2011,11:15 AM

## 2011-09-28 NOTE — Progress Notes (Signed)
Subjective: Obtaining of abdominal pain, she spiked a fever early this morning, she is also complaining of ear pain and headache. Per family this is chronic for her. She had a CT scan done that showed a calcified lesion which is probably  a large degenerated posterior uterine fibroid.   Objective: Vital signs in last 24 hours: Filed Vitals:   09/27/11 2145 09/28/11 0600 09/28/11 0603 09/28/11 1320  BP: 133/61 136/50  128/51  Pulse: 96 90  92  Temp: 101.8 F (38.8 C)  98.1 F (36.7 C) 98.6 F (37 C)  TempSrc:   Axillary   Resp: 18 18  19   Height:      Weight:      SpO2: 100% 99%  100%    Intake/Output Summary (Last 24 hours) at 09/28/11 1426 Last data filed at 09/28/11 1300  Gross per 24 hour  Intake 706.25 ml  Output    775 ml  Net -68.75 ml    Weight change:   General: Alert, awake, poor historian HEENT: No bruits, no goiter. Heart: Regular rate and rhythm, without murmurs, rubs, gallops. Lungs: Clear to auscultation bilaterally. Abdomen: Soft, nontender, nondistended, positive bowel sounds. Extremities: No clubbing cyanosis or edema with positive pedal pulses. Neuro: Grossly intact, nonfocal.    Lab Results: Results for orders placed during the hospital encounter of 09/22/11 (from the past 24 hour(s))  CBC     Status: Abnormal   Collection Time   09/27/11  8:03 PM      Component Value Range   WBC 10.6 (*) 4.0 - 10.5 (K/uL)   RBC 3.19 (*) 3.87 - 5.11 (MIL/uL)   Hemoglobin 10.1 (*) 12.0 - 15.0 (g/dL)   HCT 16.1 (*) 09.6 - 46.0 (%)   MCV 90.3  78.0 - 100.0 (fL)   MCH 31.7  26.0 - 34.0 (pg)   MCHC 35.1  30.0 - 36.0 (g/dL)   RDW 04.5  40.9 - 81.1 (%)   Platelets 382  150 - 400 (K/uL)  DIFFERENTIAL     Status: Abnormal   Collection Time   09/27/11  8:03 PM      Component Value Range   Neutrophils Relative 78 (*) 43 - 77 (%)   Neutro Abs 8.3 (*) 1.7 - 7.7 (K/uL)   Lymphocytes Relative 12  12 - 46 (%)   Lymphs Abs 1.3  0.7 - 4.0 (K/uL)   Monocytes Relative 9   3 - 12 (%)   Monocytes Absolute 0.9  0.1 - 1.0 (K/uL)   Eosinophils Relative 1  0 - 5 (%)   Eosinophils Absolute 0.1  0.0 - 0.7 (K/uL)   Basophils Relative 0  0 - 1 (%)   Basophils Absolute 0.0  0.0 - 0.1 (K/uL)  AMYLASE     Status: Normal   Collection Time   09/27/11  8:03 PM      Component Value Range   Amylase 44  0 - 105 (U/L)  LIPASE, BLOOD     Status: Normal   Collection Time   09/27/11  8:03 PM      Component Value Range   Lipase 13  11 - 59 (U/L)  COMPREHENSIVE METABOLIC PANEL     Status: Abnormal   Collection Time   09/27/11  8:03 PM      Component Value Range   Sodium 133 (*) 135 - 145 (mEq/L)   Potassium 2.7 (*) 3.5 - 5.1 (mEq/L)   Chloride 92 (*) 96 - 112 (mEq/L)   CO2 31  19 - 32 (mEq/L)   Glucose, Bld 106 (*) 70 - 99 (mg/dL)   BUN 9  6 - 23 (mg/dL)   Creatinine, Ser 4.09  0.50 - 1.10 (mg/dL)   Calcium 9.2  8.4 - 81.1 (mg/dL)   Total Protein 7.0  6.0 - 8.3 (g/dL)   Albumin 2.4 (*) 3.5 - 5.2 (g/dL)   AST 22  0 - 37 (U/L)   ALT 22  0 - 35 (U/L)   Alkaline Phosphatase 160 (*) 39 - 117 (U/L)   Total Bilirubin 0.4  0.3 - 1.2 (mg/dL)   GFR calc non Af Amer >90  >90 (mL/min)   GFR calc Af Amer >90  >90 (mL/min)  LACTIC ACID, PLASMA     Status: Normal   Collection Time   09/27/11  8:20 PM      Component Value Range   Lactic Acid, Venous 1.0  0.5 - 2.2 (mmol/L)  BASIC METABOLIC PANEL     Status: Abnormal   Collection Time   09/28/11  5:33 AM      Component Value Range   Sodium 136  135 - 145 (mEq/L)   Potassium 2.8 (*) 3.5 - 5.1 (mEq/L)   Chloride 95 (*) 96 - 112 (mEq/L)   CO2 31  19 - 32 (mEq/L)   Glucose, Bld 102 (*) 70 - 99 (mg/dL)   BUN 10  6 - 23 (mg/dL)   Creatinine, Ser 9.14  0.50 - 1.10 (mg/dL)   Calcium 9.0  8.4 - 78.2 (mg/dL)   GFR calc non Af Amer >90  >90 (mL/min)   GFR calc Af Amer >90  >90 (mL/min)     Micro: No results found for this or any previous visit (from the past 240 hour(s)).  Studies/Results: Dg Abd 1 View  09/27/2011   *RADIOLOGY REPORT*  Clinical Data: Abdominal pain and bloating  ABDOMEN - 1 VIEW  Comparison: CT of 09/22/2011.  Findings: Single supine view of the abdomen and pelvis.  Gas filled small bowel loops are upper normal in caliber.  Gas and stool filled colon is normal in caliber.  Distal gas identified.  Large calcified mass as detailed on recent CT within the central pelvis. Vascular calcifications.  No pneumatosis or free intraperitoneal air.  IMPRESSION: Gas filled small bowel loops at upper normal size.  Question mild adynamic ileus.  No obstruction or free intraperitoneal air.  Large calcified pelvic mass, as detailed on the 09/22/2011 CT report.  Original Report Authenticated By: Consuello Bossier, M.D.   Ct Maxillofacial W/cm  09/27/2011  *RADIOLOGY REPORT*  Clinical Data: Pain, evaluate for abscess  CT MAXILLOFACIAL WITH CONTRAST  Technique:  Multidetector CT imaging of the maxillofacial structures was performed with intravenous contrast. Multiplanar CT image reconstructions were also generated.  Contrast: 80mL OMNIPAQUE IOHEXOL 300 MG/ML IV SOLN  Comparison: MRI brain dated 09/23/2011  Findings: The visualized paranasal sinuses are essentially clear. The mastoid air cells are unopacified.  The bilateral orbits, including the retroconal soft tissues, are within normal limits.  No soft tissue abscess is seen.  The visualized thyroid is unremarkable.  Degenerative changes of the upper cervical spine without evidence of fracture to C6.  The visualized brain parenchyma is within normal limits.  IMPRESSION: Normal maxillofacial CT.  Original Report Authenticated By: Charline Bills, M.D.   Dg Chest Port 1 View  09/28/2011  *RADIOLOGY REPORT*  Clinical Data: Fever  PORTABLE CHEST - 1 VIEW  Comparison: 09/22/2011  Findings: Mild perihilar interstitial prominence, possibly reflecting  mild interstitial edema.  No pleural effusion or pneumothorax.  Stable mild cardiomegaly.  IMPRESSION: Stable cardiomegaly with  possible mild interstitial edema.  Original Report Authenticated By: Charline Bills, M.D.    Medications:     . amLODipine  10 mg Oral Daily  . baclofen  10 mg Oral TID  . buPROPion  300 mg Oral Daily  . carbamazepine  200 mg Oral TID  . chlorhexidine  15 mL Mouth/Throat BID  . docusate sodium  100 mg Oral BID  . donepezil  5 mg Oral Daily  . feeding supplement  237 mL Oral TID WC  . heparin  5,000 Units Subcutaneous Q8H  . interferon beta-1a  30 mcg Intramuscular Weekly  . labetalol  100 mg Oral BID  . losartan  50 mg Oral Daily  .  morphine injection  1 mg Intravenous Once  . mulitivitamin with minerals  1 tablet Oral Daily  . piperacillin-tazobactam (ZOSYN)  IV  3.375 g Intravenous Q8H  . polyethylene glycol  17 g Oral BID  . potassium chloride  10 mEq Intravenous Q1 Hr x 2  . potassium chloride  10 mEq Intravenous Q1 Hr x 4  . potassium chloride  40 mEq Oral TID  . rosuvastatin  20 mg Oral Daily  . senna  1 tablet Oral BID  . sodium chloride  3 mL Intravenous Q12H  . tolterodine  2 mg Oral Daily  . vancomycin  500 mg Intravenous Q12H  . vancomycin  1,000 mg Intravenous Once  . DISCONTD: amoxicillin-clavulanate  500 mg Oral Q8H  . DISCONTD: hydrochlorothiazide  12.5 mg Oral Daily  . DISCONTD: potassium chloride  20 mEq Oral BID     Assessment: Failure to thrive - multifactorial. Continue Dysphagia diet and add Ensure TID.  Active Problems:  Multiple sclerosis -MRI showed no acute infarct but marked white matter type changes consistent with patient's history of multiple sclerosis no restricted motion or enhancement. Start Avonex. Hypertension - continue home medications  Depression  Organic brain syndrome  Decubitus ulcer, buttock it is stage II. Will continue DuoDERM  Fibroid uterus: Reviewed [place] report from 2002 with same result as present CT scan of the Pelvis, Follow up OP. Do a pelvic ultrasound Abdominal pain, lower - maybe due to constipation - give Dulcolax  Senokot S and add Mirilax. X-ray shows mild adynamic ileus, no obstruction. Will repeat suppository.   Fever likely aspiration, started  empiric antibiotics. Chest x-ray does not show any pneumonia. Get a swallow evaluation.   LOS: 6 days   [facility] 09/28/2011, 2:26 PM

## 2011-09-29 LAB — URINE CULTURE
Culture  Setup Time: 201212190939
Special Requests: NORMAL

## 2011-09-29 LAB — CARDIAC PANEL(CRET KIN+CKTOT+MB+TROPI)
CK, MB: 1 ng/mL (ref 0.3–4.0)
Troponin I: <0.3 ng/mL (ref <0.30)

## 2011-09-29 LAB — BASIC METABOLIC PANEL
BUN: 8 mg/dL (ref 6–23)
Calcium: 8.5 mg/dL (ref 8.4–10.5)
GFR calc non Af Amer: >90 mL/min (ref >90)
Glucose, Bld: 95 mg/dL (ref 70–99)

## 2011-09-29 LAB — CBC
MCH: 30.9 pg (ref 26.0–34.0)
MCHC: 33.9 g/dL (ref 30.0–36.0)
Platelets: 344 10*3/uL (ref 150–400)
RDW: 12.4 % (ref 11.5–15.5)

## 2011-09-29 MED ORDER — POTASSIUM CHLORIDE 2 MEQ/ML IV SOLN
INTRAVENOUS | Status: DC
  Administered 2011-09-29 – 2011-10-01 (×2): via INTRAVENOUS
  Filled 2011-09-29 (×4): qty 1000

## 2011-09-29 MED ORDER — POTASSIUM CHLORIDE 10 MEQ/100ML IV SOLN
10.0000 meq | INTRAVENOUS | Status: AC
  Administered 2011-09-29 (×4): 10 meq via INTRAVENOUS
  Filled 2011-09-29 (×4): qty 100

## 2011-09-29 NOTE — Progress Notes (Signed)
Speech Language/Pathology Clinical/Bedside Swallow Evaluation Patient Details  Name: [name] MRN: [id number] DOB: Sep 21, 1946 Today's Date: 09/29/2011  Past Medical History:  Past Medical History  Diagnosis Date  . Multiple sclerosis     Right sided weakness and gait disorder, bladder and bowel problems   . Hypertension   . Depression   . 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 Trigeminal neuralgia   . Dyslipidemia   . Fibroids   . Overdose of muscle relaxant     Unintentional baclofen overdose    Past Surgical History:  Past Surgical History  Procedure Date  . Strabismus surgery     Right eye   . No past surgeries    HPI:  Pt is a 65 year old female admitted by son who is primary cargiver who help with care.  Pt has multiple sclerosis, has experienced progressive decline over the past year with weight loss, poor PO intake, decreased mobility, bed sores. Pt with hisotry of Trigeminal Neuralgia with improvement in right sided facial pain after gamma knife surgery. Pt found to have oral abcess.  Per son pt able to eat soft foods though intake is poor.  Yesterday pm 09/28/11 pt with nausea and vomiting with increased abdominal pain, made NPO, swallow eval ordered.     Assessment/Recommendations/Treatment Plan    SLP Assessment Clinical Impression Statement: Pt with significant mandibular pain with mastication and cold beverages.  Pt points to her ear/TMJ area to identify painful site.  Pt functional with PO intake though SLP recommends a Dysphagia 2 diet with tepid/warm liquids for comfort.  Risk for Aspiration: Mild  Recommendations Solid Consistency: Dysphagia 2 (Fine chop) Liquid Consistency: Thin Liquid Administration via: Straw;Cup Medication Administration: Whole meds with liquid Supervision: Patient able to self feed (Encourage pt to self feed. Provide assist as needed. ) Compensations: Slow rate;Small sips/bites Postural Changes and/or Swallow Maneuvers:  Seated upright 90 degrees Oral Care Recommendations: Oral care BID;Staff/trained caregiver to provide oral care Follow up Recommendations: Skilled Nursing facility  Treatment Plan Treatment Plan Recommendations: No treatment recommended at this time     Individuals Consulted Consulted and Agree with Results and Recommendations: Patient;Family member/caregiver;MD Harlon Ditty, MA CCC-SLP [phone]  Faviola Klare, Riley Nearing 09/29/2011,9:14 AM

## 2011-09-29 NOTE — Progress Notes (Signed)
Nutrition Follow-up  Diet Order:  NPO yesterday secondary to abdominal pain.  Dys 2 thin liquids ordered this AM s/p SLP eval.  Ensure Clinical Strength tid.  Ate 50-100% of meals on 12/17--no meal completions recorded since.  Meds: Scheduled Meds:   . amLODipine  10 mg Oral Daily  . baclofen  10 mg Oral TID  . buPROPion  300 mg Oral Daily  . carbamazepine  200 mg Oral TID  . chlorhexidine  15 mL Mouth/Throat BID  . docusate sodium  100 mg Oral BID  . donepezil  5 mg Oral Daily  . feeding supplement  237 mL Oral TID WC  . heparin  5,000 Units Subcutaneous Q8H  . interferon beta-1a  30 mcg Intramuscular Weekly  . labetalol  100 mg Oral BID  . losartan  50 mg Oral Daily  . mulitivitamin with minerals  1 tablet Oral Daily  . piperacillin-tazobactam (ZOSYN)  IV  3.375 g Intravenous Q8H  . polyethylene glycol  17 g Oral BID  . potassium chloride  10 mEq Intravenous Q1 Hr x 4  . potassium chloride  10 mEq Intravenous Q1 Hr x 2  . potassium chloride  10 mEq Intravenous Q1 Hr x 4  . potassium chloride  40 mEq Oral TID  . rosuvastatin  20 mg Oral Daily  . senna  1 tablet Oral BID  . sodium chloride  3 mL Intravenous Q12H  . tolterodine  2 mg Oral Daily  . traMADol  50 mg Oral Q6H  . vancomycin  500 mg Intravenous Q12H  . vancomycin  1,000 mg Intravenous Once  . DISCONTD: amoxicillin-clavulanate  500 mg Oral Q8H  . DISCONTD: potassium chloride  20 mEq Oral BID   Continuous Infusions:   . sodium chloride 0.45 % with kcl    . DISCONTD: sodium chloride 75 mL/hr at 09/29/11 0554   PRN Meds:.sodium chloride, acetaminophen, acetaminophen, ALPRAZolam, bisacodyl, morphine injection, ondansetron (ZOFRAN) IV, ondansetron, oxyCODONE-acetaminophen, sodium chloride  Labs:  CMP     Component Value Date/Time   NA 136 09/29/2011 0700   K 3.0* 09/29/2011 0700   CL 100 09/29/2011 0700   CO2 25 09/29/2011 0700   GLUCOSE 95 09/29/2011 0700   BUN 8 09/29/2011 0700   CREATININE 0.49* 09/29/2011  0700   CALCIUM 8.5 09/29/2011 0700   PROT 7.0 09/27/2011 2003   ALBUMIN 2.4* 09/27/2011 2003   AST 22 09/27/2011 2003   ALT 22 09/27/2011 2003   ALKPHOS 160* 09/27/2011 2003   BILITOT 0.4 09/27/2011 2003   GFRNONAA >90 09/29/2011 0700   GFRAA >90 09/29/2011 0700     Intake/Output Summary (Last 24 hours) at 09/29/11 1030 Last data filed at 09/29/11 0600  Gross per 24 hour  Intake   1875 ml  Output   1000 ml  Net    875 ml    Weight Status:  No new weights available  Nutrition Dx:  Malnutrition r/t inadequate oral intake, chewing difficulty, catabolic illness--ongoing.  Goal:  Meet minimum estimated needs with po intake and added supplements--not met.  Intervention:  Continue current diet and supplements as ordered.  Monitor:  RD to follow for ongoing diet tolerance (since restarted) as well as adequacy of intakes.  Adjust nutrition care plan as indicated.   Otto Herb Pager #:  [phone]

## 2011-09-29 NOTE — Progress Notes (Signed)
Patient now inpatient status and qualifies for SNF. CSW called and spoke with son who is agreeable to SNF search. CSW emailed son a list of SNF options. CSW completed FL2 and faxed out to [facility]. CSW will continue to follow. [address], [place] [phone]

## 2011-09-29 NOTE — Progress Notes (Signed)
Subjective: Low-grade fever overnight  Objective: Vital signs in last 24 hours: Filed Vitals:   09/28/11 0603 09/28/11 1320 09/28/11 2119 09/29/11 0603  BP:  128/51 154/53 161/65  Pulse:  92 104 96  Temp: 98.1 F (36.7 C) 98.6 F (37 C) 100.1 F (37.8 C) 99.4 F (37.4 C)  TempSrc: Axillary  Oral Oral  Resp:  19 18 18   Height:      Weight:      SpO2:  100% 97% 99%    Intake/Output Summary (Last 24 hours) at 09/29/11 1333 Last data filed at 09/29/11 0600  Gross per 24 hour  Intake   1875 ml  Output    750 ml  Net   1125 ml    Weight change:   Genera  awake, somewhat confused,, in no acute distress. HEENT: No bruits, no goiter. Heart: Regular rate and rhythm, without murmurs, rubs, gallops. Lungs: Clear to auscultation bilaterally. Abdomen: Soft, nontender, nondistended, positive bowel sounds. Extremities: No clubbing cyanosis or edema with positive pedal pulses. Neuro: Grossly intact, nonfocal.    Lab Results: Results for orders placed during the hospital encounter of 09/22/11 (from the past 24 hour(s))  MAGNESIUM     Status: Normal   Collection Time   09/28/11  5:25 PM      Component Value Range   Magnesium 2.1  1.5 - 2.5 (mg/dL)  CARDIAC PANEL(CRET KIN+CKTOT+MB+TROPI)     Status: Normal   Collection Time   09/28/11  5:26 PM      Component Value Range   Total CK 37  7 - 177 (U/L)   CK, MB 1.2  0.3 - 4.0 (ng/mL)   Troponin I <0.30  <0.30 (ng/mL)   Relative Index RELATIVE INDEX IS INVALID  0.0 - 2.5   URINALYSIS, ROUTINE W REFLEX MICROSCOPIC     Status: Abnormal   Collection Time   09/28/11  6:46 PM      Component Value Range   Color, Urine ORANGE (*) YELLOW    APPearance CLOUDY (*) CLEAR    Specific Gravity, Urine 1.036 (*) 1.005 - 1.030    pH 6.5  5.0 - 8.0    Glucose, UA NEGATIVE  NEGATIVE (mg/dL)   Hgb urine dipstick MODERATE (*) NEGATIVE    Bilirubin Urine MODERATE (*) NEGATIVE    Ketones, ur 15 (*) NEGATIVE (mg/dL)   Protein, ur 161 (*) NEGATIVE  (mg/dL)   Urobilinogen, UA 2.0 (*) 0.0 - 1.0 (mg/dL)   Nitrite POSITIVE (*) NEGATIVE    Leukocytes, UA MODERATE (*) NEGATIVE   URINE MICROSCOPIC-ADD ON     Status: Normal   Collection Time   09/28/11  6:46 PM      Component Value Range   Squamous Epithelial / LPF RARE  RARE    WBC, UA 3-6  <3 (WBC/hpf)   RBC / HPF 3-6  <3 (RBC/hpf)   Bacteria, UA RARE  RARE    Urine-Other MUCOUS PRESENT    CARDIAC PANEL(CRET KIN+CKTOT+MB+TROPI)     Status: Normal   Collection Time   09/29/11 12:00 AM      Component Value Range   Total CK 38  7 - 177 (U/L)   CK, MB 1.0  0.3 - 4.0 (ng/mL)   Troponin I <0.30  <0.30 (ng/mL)   Relative Index RELATIVE INDEX IS INVALID  0.0 - 2.5   BASIC METABOLIC PANEL     Status: Abnormal   Collection Time   09/29/11  7:00 AM  Component Value Range   Sodium 136  135 - 145 (mEq/L)   Potassium 3.0 (*) 3.5 - 5.1 (mEq/L)   Chloride 100  96 - 112 (mEq/L)   CO2 25  19 - 32 (mEq/L)   Glucose, Bld 95  70 - 99 (mg/dL)   BUN 8  6 - 23 (mg/dL)   Creatinine, Ser 1.61 (*) 0.50 - 1.10 (mg/dL)   Calcium 8.5  8.4 - 09.6 (mg/dL)   GFR calc non Af Amer >90  >90 (mL/min)   GFR calc Af Amer >90  >90 (mL/min)  CBC     Status: Abnormal   Collection Time   09/29/11  7:00 AM      Component Value Range   WBC 7.4  4.0 - 10.5 (K/uL)   RBC 2.98 (*) 3.87 - 5.11 (MIL/uL)   Hemoglobin 9.2 (*) 12.0 - 15.0 (g/dL)   HCT 04.5 (*) 40.9 - 46.0 (%)   MCV 90.9  78.0 - 100.0 (fL)   MCH 30.9  26.0 - 34.0 (pg)   MCHC 33.9  30.0 - 36.0 (g/dL)   RDW 81.1  91.4 - 78.2 (%)   Platelets 344  150 - 400 (K/uL)     Micro: Recent Results (from the past 240 hour(s))  CULTURE, BLOOD (ROUTINE X 2)     Status: Normal (Preliminary result)   Collection Time   09/28/11  1:10 AM      Component Value Range Status Comment   Specimen Description BLOOD RIGHT ARM   Final    Special Requests BOTTLES DRAWN AEROBIC ONLY 5CC   Final    Setup Time [id number]   Final    Culture     Final    Value:         BLOOD CULTURE RECEIVED NO GROWTH TO DATE CULTURE WILL BE HELD FOR 5 DAYS BEFORE ISSUING A FINAL NEGATIVE REPORT   Report Status PENDING   Incomplete   CULTURE, BLOOD (ROUTINE X 2)     Status: Normal (Preliminary result)   Collection Time   09/28/11  1:20 AM      Component Value Range Status Comment   Specimen Description BLOOD RIGHT ARM   Final    Special Requests BOTTLES DRAWN AEROBIC ONLY 5CC   Final    Setup Time [id number]   Final    Culture     Final    Value:        BLOOD CULTURE RECEIVED NO GROWTH TO DATE CULTURE WILL BE HELD FOR 5 DAYS BEFORE ISSUING A FINAL NEGATIVE REPORT   Report Status PENDING   Incomplete     Studies/Results: Dg Abd 1 View  09/28/2011  *RADIOLOGY REPORT*  Clinical Data: Abdominal pain and bloating  ABDOMEN - 1 VIEW  Comparison: 09/27/2011  Findings: There is diffuse gas-filled large and small bowel in the abdomen with out evidence for obstruction.  Contrast material is become further concentrated in the left colon.  Large calcified central pelvic mass again noted.  IMPRESSION: No evidence for bowel obstruction.  No substantial stool volume in the left colon.  Large calcified pelvic mass.  Original Report Authenticated By: ERIC A. MANSELL, M.D.   Dg Abd 1 View  09/27/2011  *RADIOLOGY REPORT*  Clinical Data: Abdominal pain and bloating  ABDOMEN - 1 VIEW  Comparison: CT of 09/22/2011.  Findings: Single supine view of the abdomen and pelvis.  Gas filled small bowel loops are upper normal in caliber.  Gas and stool filled colon is normal  in caliber.  Distal gas identified.  Large calcified mass as detailed on recent CT within the central pelvis. Vascular calcifications.  No pneumatosis or free intraperitoneal air.  IMPRESSION: Gas filled small bowel loops at upper normal size.  Question mild adynamic ileus.  No obstruction or free intraperitoneal air.  Large calcified pelvic mass, as detailed on the 09/22/2011 CT report.  Original Report Authenticated By: Consuello Bossier, M.D.   Dg Chest Port 1 View  09/28/2011  *RADIOLOGY REPORT*  Clinical Data: Fever  PORTABLE CHEST - 1 VIEW  Comparison: 09/22/2011  Findings: Mild perihilar interstitial prominence, possibly reflecting mild interstitial edema.  No pleural effusion or pneumothorax.  Stable mild cardiomegaly.  IMPRESSION: Stable cardiomegaly with possible mild interstitial edema.  Original Report Authenticated By: Charline Bills, M.D.    Medications:     . amLODipine  10 mg Oral Daily  . baclofen  10 mg Oral TID  . buPROPion  300 mg Oral Daily  . carbamazepine  200 mg Oral TID  . chlorhexidine  15 mL Mouth/Throat BID  . docusate sodium  100 mg Oral BID  . donepezil  5 mg Oral Daily  . feeding supplement  237 mL Oral TID WC  . heparin  5,000 Units Subcutaneous Q8H  . interferon beta-1a  30 mcg Intramuscular Weekly  . labetalol  100 mg Oral BID  . losartan  50 mg Oral Daily  . mulitivitamin with minerals  1 tablet Oral Daily  . piperacillin-tazobactam (ZOSYN)  IV  3.375 g Intravenous Q8H  . polyethylene glycol  17 g Oral BID  . potassium chloride  10 mEq Intravenous Q1 Hr x 4  . potassium chloride  10 mEq Intravenous Q1 Hr x 2  . potassium chloride  10 mEq Intravenous Q1 Hr x 4  . potassium chloride  40 mEq Oral TID  . rosuvastatin  20 mg Oral Daily  . senna  1 tablet Oral BID  . sodium chloride  3 mL Intravenous Q12H  . tolterodine  2 mg Oral Daily  . traMADol  50 mg Oral Q6H  . vancomycin  500 mg Intravenous Q12H  . vancomycin  1,000 mg Intravenous Once  . DISCONTD: potassium chloride  20 mEq Oral BID     Assessment:   Failure to thrive - multifactorial. Continue Dysphagia diet and add Ensure TID. Swallow evaluation demonstrates significant pain during mastication. Active Problems:  Multiple sclerosis -MRI showed no acute infarct but marked white matter type changes consistent with patient's history of multiple sclerosis no restricted motion or enhancement. Start Avonex.    Hypertension - continue home medications  Depression  Organic brain syndrome  Decubitus ulcer, buttock it is stage II. Will continue DuoDERM  Wound care consult Fibroid uterus: Reviewed [place] report from 2002 with same result as present CT scan of the Pelvis, Follow up OP. Do a pelvic ultrasound  Abdominal pain, lower - maybe due to constipation - give Dulcolax Senokot S and add Mirilax. X-ray shows mild adynamic ileus, no obstruction. Will repeat suppository.  Fever likely aspiration, started empiric antibiotics. Chest x-ray does not show any pneumonia. Patient still could have ovoid aspiration because of her multiple sclerosis.  Disposition and would be to go to a skilled nursing facility, I called and updated the patient's son [name] today    LOS: 7 days   [facility] 09/29/2011, 1:33 PM

## 2011-09-30 LAB — BASIC METABOLIC PANEL
BUN: 7 mg/dL (ref 6–23)
CO2: 23 mEq/L (ref 19–32)
Chloride: 100 mEq/L (ref 96–112)
Creatinine, Ser: 0.49 mg/dL — ABNORMAL LOW (ref 0.50–1.10)
Glucose, Bld: 94 mg/dL (ref 70–99)

## 2011-09-30 NOTE — Consult Note (Signed)
WOC consult Note Reason for Consult: Initial consult performed 12/14.  Pt with pressure ulcers present on admission, refer to previous consult note. Sacrum with pink dry scar tissue from previous wound which has healed.  No open wounds or drainage.  Protected by foam dressing to reduce friction/shear.  Pt on air mattress to reduce pressure. Right heel with previous deep tissue injury, has evolved into 1X1X.1cm stage 2 wound with pink, dry wound bed. Foam dressing in place to promote healing, Prevalon boot on to reduce pressure.  Continue present plan of care. Will not plan to follow further unless re-consulted.  [address], RN, MSN, [employer]  [phone]

## 2011-09-30 NOTE — ED Provider Notes (Signed)
Evaluation and management procedures were performed by me prior to passing care of the patient to the mid-level provider in the CDU. Patient management continued under my collaboration and supervision as performed by the mid-level provider.  Felisa Bonier, MD 09/30/11 1520

## 2011-09-30 NOTE — Progress Notes (Signed)
Subjective: . Low-grade fever last night no change in her complaints  Objective: Vital signs in last 24 hours: Filed Vitals:   09/29/11 2154 09/30/11 0221 09/30/11 0631 09/30/11 0944  BP: 117/68 124/63 129/69 143/65  Pulse: 88 81 92 88  Temp: 98.4 F (36.9 C) 98.5 F (36.9 C) 100 F (37.8 C) 99 F (37.2 C)  TempSrc: Oral Oral Oral Oral  Resp: 18 20 18 18   Height:      Weight:      SpO2: 98% 100% 100% 96%    Intake/Output Summary (Last 24 hours) at 09/30/11 1351 Last data filed at 09/30/11 0900  Gross per 24 hour  Intake 1337.5 ml  Output   1025 ml  Net  312.5 ml    Weight change:   General: Alert, awake, oriented x3, in no acute distress. HEENT: No bruits, no goiter. Heart: Regular rate and rhythm, without murmurs, rubs, gallops. Lungs: Clear to auscultation bilaterally. Abdomen: Soft, nontender, nondistended, positive bowel sounds. Extremities: No clubbing cyanosis or edema with positive pedal pulses. Neuro: Grossly intact, nonfocal.    Lab Results: Results for orders placed during the hospital encounter of 09/22/11 (from the past 24 hour(s))  BASIC METABOLIC PANEL     Status: Abnormal   Collection Time   09/30/11  5:16 AM      Component Value Range   Sodium 132 (*) 135 - 145 (mEq/L)   Potassium 5.0  3.5 - 5.1 (mEq/L)   Chloride 100  96 - 112 (mEq/L)   CO2 23  19 - 32 (mEq/L)   Glucose, Bld 94  70 - 99 (mg/dL)   BUN 7  6 - 23 (mg/dL)   Creatinine, Ser 1.61 (*) 0.50 - 1.10 (mg/dL)   Calcium 8.9  8.4 - 09.6 (mg/dL)   GFR calc non Af Amer >90  >90 (mL/min)   GFR calc Af Amer >90  >90 (mL/min)     Micro: Recent Results (from the past 240 hour(s))  CULTURE, BLOOD (ROUTINE X 2)     Status: Normal (Preliminary result)   Collection Time   09/28/11  1:10 AM      Component Value Range Status Comment   Specimen Description BLOOD RIGHT ARM   Final    Special Requests BOTTLES DRAWN AEROBIC ONLY 5CC   Final    Setup Time [id number]   Final    Culture      Final    Value:        BLOOD CULTURE RECEIVED NO GROWTH TO DATE CULTURE WILL BE HELD FOR 5 DAYS BEFORE ISSUING A FINAL NEGATIVE REPORT   Report Status PENDING   Incomplete   CULTURE, BLOOD (ROUTINE X 2)     Status: Normal (Preliminary result)   Collection Time   09/28/11  1:20 AM      Component Value Range Status Comment   Specimen Description BLOOD RIGHT ARM   Final    Special Requests BOTTLES DRAWN AEROBIC ONLY 5CC   Final    Setup Time [id number]   Final    Culture     Final    Value:        BLOOD CULTURE RECEIVED NO GROWTH TO DATE CULTURE WILL BE HELD FOR 5 DAYS BEFORE ISSUING A FINAL NEGATIVE REPORT   Report Status PENDING   Incomplete   URINE CULTURE     Status: Normal   Collection Time   09/28/11  2:18 AM      Component Value Range Status Comment  Specimen Description URINE, CATHETERIZED   Final    Special Requests augmentin Normal   Final    Setup Time [id number]   Final    Colony Count NO GROWTH   Final    Culture NO GROWTH   Final    Report Status 09/29/2011 FINAL   Final     Studies/Results: Dg Abd 1 View  09/28/2011  *RADIOLOGY REPORT*  Clinical Data: Abdominal pain and bloating  ABDOMEN - 1 VIEW  Comparison: 09/27/2011  Findings: There is diffuse gas-filled large and small bowel in the abdomen with out evidence for obstruction.  Contrast material is become further concentrated in the left colon.  Large calcified central pelvic mass again noted.  IMPRESSION: No evidence for bowel obstruction.  No substantial stool volume in the left colon.  Large calcified pelvic mass.  Original Report Authenticated By: ERIC A. MANSELL, M.D.    Medications:     . amLODipine  10 mg Oral Daily  . baclofen  10 mg Oral TID  . buPROPion  300 mg Oral Daily  . carbamazepine  200 mg Oral TID  . chlorhexidine  15 mL Mouth/Throat BID  . docusate sodium  100 mg Oral BID  . donepezil  5 mg Oral Daily  . feeding supplement  237 mL Oral TID WC  . heparin  5,000 Units Subcutaneous Q8H  .  interferon beta-1a  30 mcg Intramuscular Weekly  . labetalol  100 mg Oral BID  . losartan  50 mg Oral Daily  . mulitivitamin with minerals  1 tablet Oral Daily  . piperacillin-tazobactam (ZOSYN)  IV  3.375 g Intravenous Q8H  . polyethylene glycol  17 g Oral BID  . potassium chloride  10 mEq Intravenous Q1 Hr x 4  . potassium chloride  40 mEq Oral TID  . rosuvastatin  20 mg Oral Daily  . senna  1 tablet Oral BID  . sodium chloride  3 mL Intravenous Q12H  . tolterodine  2 mg Oral Daily  . traMADol  50 mg Oral Q6H  . DISCONTD: vancomycin  500 mg Intravenous Q12H   Assessment and plan  Failure to thrive - multifactorial. Continue Dysphagia diet and add Ensure TID. Swallow evaluation demonstrates significant pain during mastication. No evidence of dental abscess on CT maxillofacial with Active Problems:  Multiple sclerosis -MRI showed no acute infarct but marked white matter type changes consistent with patient's history of multiple sclerosis no restricted motion or enhancement. Start Avonex.  Hypertension - continue home medications  Depression  Organic brain syndrome  Decubitus ulcer, buttock it is stage II. Will continue DuoDERM  Wound care consult  Fibroid uterus: Reviewed [place] report from 2002 with same result as present CT scan of the Pelvis, Follow up OP. Do a pelvic ultrasound  Abdominal pain, lower - maybe due to constipation - give Dulcolax Senokot S and add Mirilax. X-ray shows mild adynamic ileus, no obstruction. Will repeat suppository.   Fever likely aspiration, vancomycin and Zosyn. Vancomycin discontinued because of drug-induced fever. Potential source could be her buttock, where she has a stage II decubitus ulcer. Will consider a repeat CT scan of her pelvis if fever persists.. Chest x-ray does not show any pneumonia. Patient still could have ovoid aspiration because of her multiple sclerosis.  Disposition and would be to go to a skilled nursing facility, I called and updated  the patient's son [name] today   LOS: 8 days   [facility] 09/30/2011, 1:51 PM

## 2011-09-30 NOTE — Progress Notes (Signed)
Utilization Review Completed.  [name] T  09/30/2011 

## 2011-09-30 NOTE — Progress Notes (Signed)
CSW called and emailed patient's son [name]) and gave bed offers. [facility], [facility], [facility], [facility], and [facility] made offers. Son stated he would review facilities and would follow up with CSW regarding his choice. CSW will continue to follow. [place], [place] [phone]

## 2011-10-01 LAB — BASIC METABOLIC PANEL
BUN: 5 mg/dL — ABNORMAL LOW (ref 6–23)
CO2: 23 mEq/L (ref 19–32)
Calcium: 9.2 mg/dL (ref 8.4–10.5)
Glucose, Bld: 155 mg/dL — ABNORMAL HIGH (ref 70–99)
Sodium: 132 mEq/L — ABNORMAL LOW (ref 135–145)

## 2011-10-01 MED ORDER — SODIUM POLYSTYRENE SULFONATE 15 GM/60ML PO SUSP
30.0000 g | Freq: Once | ORAL | Status: AC
  Administered 2011-10-01: 30 g via ORAL
  Filled 2011-10-01: qty 120

## 2011-10-01 NOTE — Progress Notes (Signed)
Subjective: Afebrile overnight, no complaints other than dental pain  Objective: Vital signs in last 24 hours: Filed Vitals:   09/30/11 1843 09/30/11 2144 09/30/11 2145 10/01/11 0608  BP: 125/60 132/75  118/65  Pulse: 80 80  92  Temp: 98.8 F (37.1 C)  99.3 F (37.4 C) 98.6 F (37 C)  TempSrc: Oral  Oral Oral  Resp: 18  18 18   Height:      Weight:      SpO2: 99%  99% 100%    Intake/Output Summary (Last 24 hours) at 10/01/11 1146 Last data filed at 10/01/11 0700  Gross per 24 hour  Intake 1166.67 ml  Output   2100 ml  Net -933.33 ml    Weight change:   General: Confused but unchanged HEENT: No bruits, no goiter. Heart: Regular rate and rhythm, without murmurs, rubs, gallops. Lungs: Clear to auscultation bilaterally. Abdomen: Soft, nontender, nondistended, positive bowel sounds. Extremities: No clubbing cyanosis or edema with positive pedal pulses. Neuro: Grossly intact, nonfocal.    Lab Results: No results found for this or any previous visit (from the past 24 hour(s)).   Micro: Recent Results (from the past 240 hour(s))  CULTURE, BLOOD (ROUTINE X 2)     Status: Normal (Preliminary result)   Collection Time   09/28/11  1:10 AM      Component Value Range Status Comment   Specimen Description BLOOD RIGHT ARM   Final    Special Requests BOTTLES DRAWN AEROBIC ONLY 5CC   Final    Setup Time [id number]   Final    Culture     Final    Value:        BLOOD CULTURE RECEIVED NO GROWTH TO DATE CULTURE WILL BE HELD FOR 5 DAYS BEFORE ISSUING A FINAL NEGATIVE REPORT   Report Status PENDING   Incomplete   CULTURE, BLOOD (ROUTINE X 2)     Status: Normal (Preliminary result)   Collection Time   09/28/11  1:20 AM      Component Value Range Status Comment   Specimen Description BLOOD RIGHT ARM   Final    Special Requests BOTTLES DRAWN AEROBIC ONLY 5CC   Final    Setup Time [id number]   Final    Culture     Final    Value:        BLOOD CULTURE RECEIVED NO GROWTH TO DATE  CULTURE WILL BE HELD FOR 5 DAYS BEFORE ISSUING A FINAL NEGATIVE REPORT   Report Status PENDING   Incomplete   URINE CULTURE     Status: Normal   Collection Time   09/28/11  2:18 AM      Component Value Range Status Comment   Specimen Description URINE, CATHETERIZED   Final    Special Requests augmentin Normal   Final    Setup Time [id number]   Final    Colony Count NO GROWTH   Final    Culture NO GROWTH   Final    Report Status 09/29/2011 FINAL   Final     Studies/Results: No results found.  Medications:     . amLODipine  10 mg Oral Daily  . baclofen  10 mg Oral TID  . buPROPion  300 mg Oral Daily  . carbamazepine  200 mg Oral TID  . chlorhexidine  15 mL Mouth/Throat BID  . docusate sodium  100 mg Oral BID  . donepezil  5 mg Oral Daily  . feeding supplement  237 mL Oral TID WC  .  heparin  5,000 Units Subcutaneous Q8H  . interferon beta-1a  30 mcg Intramuscular Weekly  . labetalol  100 mg Oral BID  . losartan  50 mg Oral Daily  . mulitivitamin with minerals  1 tablet Oral Daily  . piperacillin-tazobactam (ZOSYN)  IV  3.375 g Intravenous Q8H  . polyethylene glycol  17 g Oral BID  . potassium chloride  40 mEq Oral TID  . rosuvastatin  20 mg Oral Daily  . senna  1 tablet Oral BID  . sodium chloride  3 mL Intravenous Q12H  . tolterodine  2 mg Oral Daily  . traMADol  50 mg Oral Q6H     Assessment:  Failure to thrive - multifactorial. Continue Dysphagia diet and add Ensure TID. Swallow evaluation demonstrates significant pain during mastication. No evidence of dental abscess on CT maxillofacial with     Multiple sclerosis -MRI showed no acute infarct but marked white matter type changes consistent with patient's history of multiple sclerosis no restricted motion or enhancement. Start Avonex.  Hypertension - continue home medications  Depression  Organic brain syndrome  Decubitus ulcer, buttock it is stage II. Will continue DuoDERM  Wound care consult  Fibroid uterus:  Reviewed [place] report from 2002 with same result as present CT scan of the Pelvis, Follow up OP. Do a pelvic ultrasound  Abdominal pain, lower - maybe due to constipation - give Dulcolax Senokot S and add Mirilax. X-ray shows mild adynamic ileus, no obstruction. Will repeat suppository.    Fever likely aspiration, on Zosyn. Vancomycin discontinued because of drug-induced fever. Will consider discontinuing Zosyn in one to 2 days. Continue to follow cultures.  Disposition and would be to go to a skilled nursing facility, I called and updated the patient's son [name] yesterday       LOS: 9 days   [facility] 10/01/2011, 11:46 AM

## 2011-10-01 NOTE — Progress Notes (Signed)
CSW l/m for pt son to f/u on bed offers. Milus Banister MSW,LCSW w/e Coverage [phone]

## 2011-10-01 NOTE — Progress Notes (Signed)
ANTIBIOTIC CONSULT NOTE - FOLLOW UP  Pharmacy Consult for Zosyn Indication: aspiration pneumonia  Allergies  Allergen Reactions  . Shellfish Allergy     Hives     Patient Measurements: Height: 5\' 4"  (162.6 cm) Weight: 148 lb 3.2 oz (67.223 kg) IBW/kg (Calculated) : 54.7   Vital Signs: Temp: 98.6 F (37 C) (12/22 4540) Temp src: Oral (12/22 0608) BP: 118/65 mmHg (12/22 0608) Pulse Rate: 92  (12/22 0608) Intake/Output from previous day: 12/21 0701 - 12/22 0700 In: 1301.7 [P.O.:60; I.V.:1166.7; IV Piggyback:75] Out: 2100 [Urine:2100] Intake/Output from this shift:    Labs:  Basename 09/30/11 0516 09/29/11 0700  WBC -- 7.4  HGB -- 9.2*  PLT -- 344  LABCREA -- --  CREATININE 0.49* 0.49*   Estimated Creatinine Clearance: 67.8 ml/min (by C-G formula based on Cr of 0.49). No results found for this basename: VANCOTROUGH:2,VANCOPEAK:2,VANCORANDOM:2,GENTTROUGH:2,GENTPEAK:2,GENTRANDOM:2,TOBRATROUGH:2,TOBRAPEAK:2,TOBRARND:2,AMIKACINPEAK:2,AMIKACINTROU:2,AMIKACIN:2, in the last 72 hours   Microbiology: Recent Results (from the past 720 hour(s))  CULTURE, BLOOD (ROUTINE X 2)     Status: Normal (Preliminary result)   Collection Time   09/28/11  1:10 AM      Component Value Range Status Comment   Specimen Description BLOOD RIGHT ARM   Final    Special Requests BOTTLES DRAWN AEROBIC ONLY 5CC   Final    Setup Time [id number]   Final    Culture     Final    Value:        BLOOD CULTURE RECEIVED NO GROWTH TO DATE CULTURE WILL BE HELD FOR 5 DAYS BEFORE ISSUING A FINAL NEGATIVE REPORT   Report Status PENDING   Incomplete   CULTURE, BLOOD (ROUTINE X 2)     Status: Normal (Preliminary result)   Collection Time   09/28/11  1:20 AM      Component Value Range Status Comment   Specimen Description BLOOD RIGHT ARM   Final    Special Requests BOTTLES DRAWN AEROBIC ONLY 5CC   Final    Setup Time [id number]   Final    Culture     Final    Value:        BLOOD CULTURE RECEIVED NO GROWTH  TO DATE CULTURE WILL BE HELD FOR 5 DAYS BEFORE ISSUING A FINAL NEGATIVE REPORT   Report Status PENDING   Incomplete   URINE CULTURE     Status: Normal   Collection Time   09/28/11  2:18 AM      Component Value Range Status Comment   Specimen Description URINE, CATHETERIZED   Final    Special Requests augmentin Normal   Final    Setup Time [id number]   Final    Colony Count NO GROWTH   Final    Culture NO GROWTH   Final    Report Status 09/29/2011 FINAL   Final     Anti-infectives     Start     Dose/Rate Route Frequency Ordered Stop   09/29/11 0600   vancomycin (VANCOCIN) 500 mg in sodium chloride 0.9 % 100 mL IVPB  Status:  Discontinued        500 mg 100 mL/hr over 60 Minutes Intravenous Every 12 hours 09/28/11 1112 09/29/11 1500   09/28/11 1200   vancomycin (VANCOCIN) IVPB 1000 mg/200 mL premix        1,000 mg 200 mL/hr over 60 Minutes Intravenous  Once 09/28/11 1112 09/28/11 1901   09/28/11 1200  piperacillin-tazobactam (ZOSYN) IVPB 3.375 g       3.375 g 12.5 mL/hr  over 240 Minutes Intravenous 3 times per day 09/28/11 1112     09/23/11 1400   amoxicillin-clavulanate (AUGMENTIN) 500-125 MG per tablet 500 mg  Status:  Discontinued        500 mg Oral 3 times per day 09/23/11 1336 09/28/11 1124         Pharmacist System-Based Medication Review: Infectious Disease: Zosyn D#4 for suspected asp PNA/decub ulcer stage II, vanc d/c for drug fever, blood cx NGTD, urine cx neg, Tm 101.1. CXR-no PNA. May d/c Zosyn in 1-2 days. Cardiovascular: VSS. H/o HL/HTN:norvasc/statin/cozaar/ labetalol Fluid/ Nutrition: Xray shows mild adynamic ileus. on dysphagia 2 diet per speech -lots of pain with swallowing Neurology: MS, dementia, trigeminal neuralgia: on home baclofen, Wellbutrin, Avonex Nephrology/Lytes: SCr stable; K up to 5 yesterday s/p aggressive repletion. No new labs today. Pt on TID of KCl daily.  Hematology / Oncology: Hgb 9.2 and trending down Best Practices: SQ  hep   Assessment: Pt is a 65 yo F on day #4 of Zosyn for aspiration pneumonia. Pt previously on vancomycin, which was d/c'd due to drug induced fever. Tm24h: 101.1. Renal function is stable with est CrCl ~50 ml/min.  Blood cultures pending.   Plan:  1. Continue Zosyn 3.375g IV q8h infused over 4 hour 2. Follow-up culture results and duration of therapy plans  [name] 10/01/2011,1:21 PM

## 2011-10-02 MED ORDER — AMOXICILLIN-POT CLAVULANATE 875-125 MG PO TABS
1.0000 | ORAL_TABLET | Freq: Two times a day (BID) | ORAL | Status: DC
  Administered 2011-10-02 – 2011-10-06 (×9): 1 via ORAL
  Filled 2011-10-02 (×10): qty 1

## 2011-10-02 NOTE — Progress Notes (Signed)
Subjective: Doing well no complaints afebrile overnight  Objective: Vital signs in last 24 hours: Filed Vitals:   10/01/11 1410 10/01/11 2145 10/02/11 0525 10/02/11 0951  BP: 109/59 110/60 118/50 132/66  Pulse: 87 85 95 96  Temp: 98.2 F (36.8 C) 98.9 F (37.2 C) 98.9 F (37.2 C)   TempSrc: Oral     Resp: 18 18 18    Height:      Weight:      SpO2: 100% 100% 100%     Intake/Output Summary (Last 24 hours) at 10/02/11 1043 Last data filed at 10/02/11 0600  Gross per 24 hour  Intake 1760.67 ml  Output   2550 ml  Net -789.33 ml    Weight change:   General: Confused intermittently, in no acute distress. HEENT: No bruits, no goiter. Heart: Regular rate and rhythm, without murmurs, rubs, gallops. Lungs: Clear to auscultation bilaterally. Abdomen: Soft, nontender, nondistended, positive bowel sounds. Extremities: No clubbing cyanosis or edema with positive pedal pulses. Neuro: Grossly intact, nonfocal.    Lab Results: Results for orders placed during the hospital encounter of 09/22/11 (from the past 24 hour(s))  BASIC METABOLIC PANEL     Status: Abnormal   Collection Time   10/01/11  4:10 PM      Component Value Range   Sodium 132 (*) 135 - 145 (mEq/L)   Potassium 4.5  3.5 - 5.1 (mEq/L)   Chloride 101  96 - 112 (mEq/L)   CO2 23  19 - 32 (mEq/L)   Glucose, Bld 155 (*) 70 - 99 (mg/dL)   BUN 5 (*) 6 - 23 (mg/dL)   Creatinine, Ser 1.19 (*) 0.50 - 1.10 (mg/dL)   Calcium 9.2  8.4 - 14.7 (mg/dL)   GFR calc non Af Amer >90  >90 (mL/min)   GFR calc Af Amer >90  >90 (mL/min)     Micro: Recent Results (from the past 240 hour(s))  CULTURE, BLOOD (ROUTINE X 2)     Status: Normal (Preliminary result)   Collection Time   09/28/11  1:10 AM      Component Value Range Status Comment   Specimen Description BLOOD RIGHT ARM   Final    Special Requests BOTTLES DRAWN AEROBIC ONLY 5CC   Final    Setup Time [id number]   Final    Culture     Final    Value:        BLOOD CULTURE  RECEIVED NO GROWTH TO DATE CULTURE WILL BE HELD FOR 5 DAYS BEFORE ISSUING A FINAL NEGATIVE REPORT   Report Status PENDING   Incomplete   CULTURE, BLOOD (ROUTINE X 2)     Status: Normal (Preliminary result)   Collection Time   09/28/11  1:20 AM      Component Value Range Status Comment   Specimen Description BLOOD RIGHT ARM   Final    Special Requests BOTTLES DRAWN AEROBIC ONLY 5CC   Final    Setup Time [id number]   Final    Culture     Final    Value:        BLOOD CULTURE RECEIVED NO GROWTH TO DATE CULTURE WILL BE HELD FOR 5 DAYS BEFORE ISSUING A FINAL NEGATIVE REPORT   Report Status PENDING   Incomplete   URINE CULTURE     Status: Normal   Collection Time   09/28/11  2:18 AM      Component Value Range Status Comment   Specimen Description URINE, CATHETERIZED   Final  Special Requests augmentin Normal   Final    Setup Time [id number]   Final    Colony Count NO GROWTH   Final    Culture NO GROWTH   Final    Report Status 09/29/2011 FINAL   Final     Studies/Results: No results found.  Medications:  Scheduled Meds:   . amLODipine  10 mg Oral Daily  . baclofen  10 mg Oral TID  . buPROPion  300 mg Oral Daily  . carbamazepine  200 mg Oral TID  . chlorhexidine  15 mL Mouth/Throat BID  . docusate sodium  100 mg Oral BID  . donepezil  5 mg Oral Daily  . feeding supplement  237 mL Oral TID WC  . heparin  5,000 Units Subcutaneous Q8H  . interferon beta-1a  30 mcg Intramuscular Weekly  . labetalol  100 mg Oral BID  . losartan  50 mg Oral Daily  . mulitivitamin with minerals  1 tablet Oral Daily  . piperacillin-tazobactam (ZOSYN)  IV  3.375 g Intravenous Q8H  . polyethylene glycol  17 g Oral BID  . rosuvastatin  20 mg Oral Daily  . senna  1 tablet Oral BID  . sodium chloride  3 mL Intravenous Q12H  . sodium polystyrene  30 g Oral Once  . tolterodine  2 mg Oral Daily  . traMADol  50 mg Oral Q6H  . DISCONTD: potassium chloride  40 mEq Oral TID   Continuous Infusions:    . DISCONTD: sodium chloride 0.45 % with kcl Stopped (10/01/11 1726)   PRN Meds:.sodium chloride, acetaminophen, acetaminophen, ALPRAZolam, bisacodyl, morphine injection, ondansetron (ZOFRAN) IV, ondansetron, oxyCODONE-acetaminophen, sodium chloride][  Assessment:   Failure to thrive - multifactorial. Continue Dysphagia diet and add Ensure TID. Swallow evaluation demonstrates significant pain during mastication. No evidence of dental abscess on CT maxillofacial with  Multiple sclerosis -MRI showed no acute infarct but marked white matter type changes consistent with patient's history of multiple sclerosis no restricted motion or enhancement. Start Avonex.  Hypertension - continue home medications  Depression  Organic brain syndrome  Decubitus ulcer, buttock it is stage II. Will continue DuoDERM ,  Wound care consult done twice during this admission.  Fibroid uterus: Reviewed [place] report from 2002 with same result as present CT scan of the Pelvis, Follow up OP. Do a pelvic ultrasound . The patient may need outpatient gynecologic followup.  Abdominal pain, lower - maybe due to constipation - constipation resolved.  Fever likely overt aspiration vs drug fever, on Zosyn. Vancomycin discontinued because of drug-induced fever. Discontinue Zosyn, and switch to Augmentin, but she will continue for another 5 days.    Disposition and would be to go to a skilled nursing facility, I called and updated the patient's son [name] couple of days ago.    LOS: 10 days   [facility] 10/02/2011, 10:43 AM

## 2011-10-02 NOTE — Progress Notes (Signed)
CSW covering for the weekend followed up with the pts son [name] regarding bed offers. He reports he has been sick over the last few days so he has not had much of a chance to research facilities but he is leaning towards [facility] based on the on-line reviews. CSW assigned to pt will continue to follow. Ladene Artist N,LCSWA  10/02/2011 2:47 PM [phone]

## 2011-10-03 NOTE — Progress Notes (Signed)
Subjective: No complaints  Objective: Vital signs in last 24 hours: Filed Vitals:   10/02/11 0951 10/02/11 1427 10/02/11 2122 10/03/11 0522  BP: 132/66 120/64 125/60 121/72  Pulse: 96 95 79 81  Temp:  98.9 F (37.2 C) 98.1 F (36.7 C) 98.8 F (37.1 C)  TempSrc:  Oral Oral Oral  Resp:  18 18 18   Height:      Weight:      SpO2:  100% 99% 98%    Intake/Output Summary (Last 24 hours) at 10/03/11 1146 Last data filed at 10/03/11 0838  Gross per 24 hour  Intake   1010 ml  Output    850 ml  Net    160 ml    Weight change:  General: Confused intermittently, in no acute distress.  HEENT: No bruits, no goiter.  Heart: Regular rate and rhythm, without murmurs, rubs, gallops.  Lungs: Clear to auscultation bilaterally.  Abdomen: Soft, nontender, nondistended, positive bowel sounds.  Extremities: No clubbing cyanosis or edema with positive pedal pulses.  Neuro: Grossly intact, nonfocal.    Lab Results: No results found for this or any previous visit (from the past 24 hour(s)).   Micro: Recent Results (from the past 240 hour(s))  CULTURE, BLOOD (ROUTINE X 2)     Status: Normal (Preliminary result)   Collection Time   09/28/11  1:10 AM      Component Value Range Status Comment   Specimen Description BLOOD RIGHT ARM   Final    Special Requests BOTTLES DRAWN AEROBIC ONLY 5CC   Final    Setup Time [id number]   Final    Culture     Final    Value:        BLOOD CULTURE RECEIVED NO GROWTH TO DATE CULTURE WILL BE HELD FOR 5 DAYS BEFORE ISSUING A FINAL NEGATIVE REPORT   Report Status PENDING   Incomplete   CULTURE, BLOOD (ROUTINE X 2)     Status: Normal (Preliminary result)   Collection Time   09/28/11  1:20 AM      Component Value Range Status Comment   Specimen Description BLOOD RIGHT ARM   Final    Special Requests BOTTLES DRAWN AEROBIC ONLY 5CC   Final    Setup Time [id number]   Final    Culture     Final    Value:        BLOOD CULTURE RECEIVED NO GROWTH TO DATE CULTURE  WILL BE HELD FOR 5 DAYS BEFORE ISSUING A FINAL NEGATIVE REPORT   Report Status PENDING   Incomplete   URINE CULTURE     Status: Normal   Collection Time   09/28/11  2:18 AM      Component Value Range Status Comment   Specimen Description URINE, CATHETERIZED   Final    Special Requests augmentin Normal   Final    Setup Time [id number]   Final    Colony Count NO GROWTH   Final    Culture NO GROWTH   Final    Report Status 09/29/2011 FINAL   Final     Studies/Results: No results found.  Medications:     . amLODipine  10 mg Oral Daily  . amoxicillin-clavulanate  1 tablet Oral Q12H  . baclofen  10 mg Oral TID  . buPROPion  300 mg Oral Daily  . carbamazepine  200 mg Oral TID  . chlorhexidine  15 mL Mouth/Throat BID  . docusate sodium  100 mg Oral BID  . donepezil  5 mg Oral Daily  . feeding supplement  237 mL Oral TID WC  . heparin  5,000 Units Subcutaneous Q8H  . interferon beta-1a  30 mcg Intramuscular Weekly  . labetalol  100 mg Oral BID  . losartan  50 mg Oral Daily  . mulitivitamin with minerals  1 tablet Oral Daily  . polyethylene glycol  17 g Oral BID  . rosuvastatin  20 mg Oral Daily  . senna  1 tablet Oral BID  . sodium chloride  3 mL Intravenous Q12H  . tolterodine  2 mg Oral Daily  . traMADol  50 mg Oral Q6H     Assessment:   Failure to thrive - multifactorial. Continue Dysphagia diet and add Ensure TID. Swallow evaluation demonstrates significant pain during mastication. No evidence of dental abscess on CT maxillofacial with  Multiple sclerosis -MRI showed no acute infarct but marked white matter type changes consistent with patient's history of multiple sclerosis no restricted motion or enhancement. Start Avonex.  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 Will continue DuoDERM , Wound care consult done twice during this admission.  Fibroid uterus: Reviewed [place] report from  2002 with same result as present CT scan of the Pelvis, Follow up OP. Do a pelvic ultrasound . The patient may need outpatient gynecologic followup.  Abdominal pain, lower - maybe due to constipation - constipation resolved.   Fever likely overt aspiration vs drug fever, on Zosyn. Vancomycin discontinued because of drug-induced fever. Discontinue Zosyn, and switch to Augmentin, but she will continue for another 5 days.    Disposition and would be to go to a skilled nursing facility, I called and updated the patient's son [name] couple of days ago.       LOS: 11 days   Daralyn Bert 10/03/2011, 11:46 AM

## 2011-10-04 LAB — CULTURE, BLOOD (ROUTINE X 2): Culture: NO GROWTH

## 2011-10-04 NOTE — Progress Notes (Signed)
Subjective: Right Ear pain  Objective: Vital signs in last 24 hours: Filed Vitals:   10/03/11 0522 10/03/11 1300 10/03/11 2100 10/04/11 0520  BP: 121/72 116/63 122/62 130/74  Pulse: 81 84 91 96  Temp: 98.8 F (37.1 C) 98.4 F (36.9 C) 99.7 F (37.6 C) 97.8 F (36.6 C)  TempSrc: Oral Oral Oral   Resp: 18 18 18 20   Height:      Weight:      SpO2: 98% 96% 100% 100%    Intake/Output Summary (Last 24 hours) at 10/04/11 0935 Last data filed at 10/03/11 2159  Gross per 24 hour  Intake    360 ml  Output    900 ml  Net   -540 ml    Weight change:   General: Confused intermittently, in no acute distress.  HEENT: No bruits, no goiter.  Heart: Regular rate and rhythm, without murmurs, rubs, gallops.  Lungs: Clear to auscultation bilaterally.  Abdomen: Soft, nontender, nondistended, positive bowel sounds.  Extremities: No clubbing cyanosis or edema with positive pedal pulses.  Neuro: Grossly intact, nonfocal.   Lab Results: No results found for this or any previous visit (from the past 24 hour(s)).   Micro: Recent Results (from the past 240 hour(s))  CULTURE, BLOOD (ROUTINE X 2)     Status: Normal   Collection Time   09/28/11  1:10 AM      Component Value Range Status Comment   Specimen Description BLOOD RIGHT ARM   Final    Special Requests BOTTLES DRAWN AEROBIC ONLY 5CC   Final    Setup Time [id number]   Final    Culture NO GROWTH 5 DAYS   Final    Report Status 10/04/2011 FINAL   Final   CULTURE, BLOOD (ROUTINE X 2)     Status: Normal   Collection Time   09/28/11  1:20 AM      Component Value Range Status Comment   Specimen Description BLOOD RIGHT ARM   Final    Special Requests BOTTLES DRAWN AEROBIC ONLY 5CC   Final    Setup Time [id number]   Final    Culture NO GROWTH 5 DAYS   Final    Report Status 10/04/2011 FINAL   Final   URINE CULTURE     Status: Normal   Collection Time   09/28/11  2:18 AM      Component Value Range Status Comment   Specimen  Description URINE, CATHETERIZED   Final    Special Requests augmentin Normal   Final    Setup Time [id number]   Final    Colony Count NO GROWTH   Final    Culture NO GROWTH   Final    Report Status 09/29/2011 FINAL   Final     Studies/Results: No results found.  Medications:  Scheduled Meds:   . amLODipine  10 mg Oral Daily  . amoxicillin-clavulanate  1 tablet Oral Q12H  . baclofen  10 mg Oral TID  . buPROPion  300 mg Oral Daily  . carbamazepine  200 mg Oral TID  . chlorhexidine  15 mL Mouth/Throat BID  . docusate sodium  100 mg Oral BID  . donepezil  5 mg Oral Daily  . feeding supplement  237 mL Oral TID WC  . heparin  5,000 Units Subcutaneous Q8H  . interferon beta-1a  30 mcg Intramuscular Weekly  . labetalol  100 mg Oral BID  . losartan  50 mg Oral Daily  . mulitivitamin with minerals  1 tablet Oral Daily  . polyethylene glycol  17 g Oral BID  . rosuvastatin  20 mg Oral Daily  . senna  1 tablet Oral BID  . sodium chloride  3 mL Intravenous Q12H  . tolterodine  2 mg Oral Daily  . traMADol  50 mg Oral Q6H   Continuous Infusions:  PRN Meds:.sodium chloride, acetaminophen, acetaminophen, ALPRAZolam, bisacodyl, morphine injection, ondansetron (ZOFRAN) IV, ondansetron, oxyCODONE-acetaminophen, sodium chloride   Assessment:   Failure to thrive - multifactorial. Continue Dysphagia diet and add Ensure TID. Swallow evaluation demonstrates significant pain during mastication. No evidence of dental abscess on CT maxillofacial with   Multiple sclerosis -MRI showed no acute infarct but marked white matter type changes consistent with patient's history of multiple sclerosis no restricted motion or enhancement. Restarted Avonex.   Hypertension - continue home medications  Depression  Organic brain syndrome  Decubitus ulcer, buttock it is stage II. Will continue DuoDERM , Wound care consult done twice during this admission.  Fibroid uterus: Reviewed [place] report from 2002 with same  result as present CT scan of the Pelvis, Follow up OP. Do a pelvic ultrasound . The patient may need outpatient gynecologic followup.   Abdominal pain, lower - maybe due to constipation - constipation resolved.   Fever likely overt aspiration vs drug fever, on Zosyn. Vancomycin discontinued because of drug-induced fever. Discontinue Zosyn, and switch to Augmentin, but she will continue for another 5 days.     Disposition and would be to go to a skilled nursing facility, son has been sick over the last few days so he has not had much of a chance to research facilities but he is leaning towards [facility] based on the on-line reviews   LOS: 12 days   [facility] 10/04/2011, 9:35 AM

## 2011-10-05 NOTE — Progress Notes (Signed)
Subjective: No complaints  Objective: Vital signs in last 24 hours: Filed Vitals:   10/04/11 0520 10/04/11 1430 10/04/11 2120 10/05/11 0550  BP: 130/74 115/54 132/63 117/45  Pulse: 96 84 96 89  Temp: 97.8 F (36.6 C) 98.1 F (36.7 C) 99.2 F (37.3 C) 98.4 F (36.9 C)  TempSrc:  Oral    Resp: 20 18 18 16   Height:      Weight:      SpO2: 100% 98% 99% 100%    Intake/Output Summary (Last 24 hours) at 10/05/11 1019 Last data filed at 10/05/11 1010  Gross per 24 hour  Intake    843 ml  Output   1651 ml  Net   -808 ml    Weight change:    General: Confused intermittently, in no acute distress.  HEENT: No bruits, no goiter.  Heart: Regular rate and rhythm, without murmurs, rubs, gallops.  Lungs: Clear to auscultation bilaterally.  Abdomen: Soft, nontender, nondistended, positive bowel sounds.  Extremities: No clubbing cyanosis or edema with positive pedal pulses.  Neuro: Grossly intact, nonfocal.  Lab Results: No results found for this or any previous visit (from the past 24 hour(s)).   Micro: Recent Results (from the past 240 hour(s))  CULTURE, BLOOD (ROUTINE X 2)     Status: Normal   Collection Time   09/28/11  1:10 AM      Component Value Range Status Comment   Specimen Description BLOOD RIGHT ARM   Final    Special Requests BOTTLES DRAWN AEROBIC ONLY 5CC   Final    Setup Time [id number]   Final    Culture NO GROWTH 5 DAYS   Final    Report Status 10/04/2011 FINAL   Final   CULTURE, BLOOD (ROUTINE X 2)     Status: Normal   Collection Time   09/28/11  1:20 AM      Component Value Range Status Comment   Specimen Description BLOOD RIGHT ARM   Final    Special Requests BOTTLES DRAWN AEROBIC ONLY 5CC   Final    Setup Time [id number]   Final    Culture NO GROWTH 5 DAYS   Final    Report Status 10/04/2011 FINAL   Final   URINE CULTURE     Status: Normal   Collection Time   09/28/11  2:18 AM      Component Value Range Status Comment   Specimen Description  URINE, CATHETERIZED   Final    Special Requests augmentin Normal   Final    Setup Time [id number]   Final    Colony Count NO GROWTH   Final    Culture NO GROWTH   Final    Report Status 09/29/2011 FINAL   Final     Studies/Results: No results found.  Medications:   Scheduled Meds:   . amLODipine  10 mg Oral Daily  . amoxicillin-clavulanate  1 tablet Oral Q12H  . baclofen  10 mg Oral TID  . buPROPion  300 mg Oral Daily  . carbamazepine  200 mg Oral TID  . chlorhexidine  15 mL Mouth/Throat BID  . docusate sodium  100 mg Oral BID  . donepezil  5 mg Oral Daily  . feeding supplement  237 mL Oral TID WC  . heparin  5,000 Units Subcutaneous Q8H  . interferon beta-1a  30 mcg Intramuscular Weekly  . labetalol  100 mg Oral BID  . losartan  50 mg Oral Daily  . mulitivitamin with minerals  1 tablet Oral Daily  . polyethylene glycol  17 g Oral BID  . rosuvastatin  20 mg Oral Daily  . senna  1 tablet Oral BID  . sodium chloride  3 mL Intravenous Q12H  . tolterodine  2 mg Oral Daily  . traMADol  50 mg Oral Q6H   Continuous Infusions:  PRN Meds:.sodium chloride, acetaminophen, acetaminophen, ALPRAZolam, bisacodyl, morphine injection, ondansetron (ZOFRAN) IV, ondansetron, oxyCODONE-acetaminophen, sodium chloride    Assessment:   Failure to thrive - multifactorial. Continue Dysphagia diet and add Ensure TID. Swallow evaluation demonstrates significant pain during mastication. No evidence of dental abscess on CT maxillofacial with  Multiple sclerosis -MRI showed no acute infarct but marked white matter type changes consistent with patient's history of multiple sclerosis no restricted motion or enhancement. Start Avonex.  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 Will continue DuoDERM , Wound care consult done twice during this admission.  Fibroid uterus: Reviewed [place] report from 2002 with same result  as present CT scan of the Pelvis, Follow up OP. Do a pelvic ultrasound . The patient may need outpatient gynecologic followup.  Abdominal pain, lower - maybe due to constipation - constipation resolved.  Fever likely overt aspiration vs drug fever, on Zosyn. Vancomycin discontinued because of drug-induced fever. Discontinue Zosyn, and switch to Augmentin, but she will continue for another 5 days.    Disposition and would be to go to a skilled nursing facility, so far no bed availability yet   LOS: 13 days   [name] 10/05/2011, 10:19 AM

## 2011-10-05 NOTE — Progress Notes (Signed)
Nutrition Follow-up  Awaiting NHP.  Bed not yet available.  Diet Order: Dysphagia 2 thin liquids,  Ensure Clinical Strength tid--Complaints of ear ache and poor appetite, poor po intake but is drinking all of the Ensure  Meds: Scheduled Meds:   . amLODipine  10 mg Oral Daily  . amoxicillin-clavulanate  1 tablet Oral Q12H  . baclofen  10 mg Oral TID  . buPROPion  300 mg Oral Daily  . carbamazepine  200 mg Oral TID  . chlorhexidine  15 mL Mouth/Throat BID  . docusate sodium  100 mg Oral BID  . donepezil  5 mg Oral Daily  . feeding supplement  237 mL Oral TID WC  . heparin  5,000 Units Subcutaneous Q8H  . interferon beta-1a  30 mcg Intramuscular Weekly  . labetalol  100 mg Oral BID  . losartan  50 mg Oral Daily  . mulitivitamin with minerals  1 tablet Oral Daily  . polyethylene glycol  17 g Oral BID  . rosuvastatin  20 mg Oral Daily  . senna  1 tablet Oral BID  . sodium chloride  3 mL Intravenous Q12H  . tolterodine  2 mg Oral Daily  . traMADol  50 mg Oral Q6H    Labs:  CMP     Component Value Date/Time   NA 132* 10/01/2011 1610   K 4.5 10/01/2011 1610   CL 101 10/01/2011 1610   CO2 23 10/01/2011 1610   GLUCOSE 155* 10/01/2011 1610   BUN 5* 10/01/2011 1610   CREATININE 0.45* 10/01/2011 1610   CALCIUM 9.2 10/01/2011 1610   PROT 7.0 09/27/2011 2003   ALBUMIN 2.4* 09/27/2011 2003   AST 22 09/27/2011 2003   ALT 22 09/27/2011 2003   ALKPHOS 160* 09/27/2011 2003   BILITOT 0.4 09/27/2011 2003   GFRNONAA >90 10/01/2011 1610   GFRAA >90 10/01/2011 1610     Intake/Output Summary (Last 24 hours) at 10/05/11 1511 Last data filed at 10/05/11 1300  Gross per 24 hour  Intake    723 ml  Output   1150 ml  Net   -427 ml    Weight Status:  No new weight  Nutrition Dx:  Malnutrition r/t inadequate oral intake, chewing difficulty, catabolic illness-ongoing.  Goal:  Meet minimum estimated needs with po intake and added supplements-not met  Intervention:  Continue current diet  and supplements as ordered.  Encouraged po.  Monitor: RD to follow for ongoing diet tolerance as well as adequacy of intakes.  Adjust nutrition care plan as indicated.   [name] Pager #:  [phone]

## 2011-10-06 ENCOUNTER — Non-Acute Institutional Stay: Payer: Self-pay | Admitting: Family Medicine

## 2011-10-06 DIAGNOSIS — F329 Major depressive disorder, single episode, unspecified: Secondary | ICD-10-CM

## 2011-10-06 DIAGNOSIS — L97409 Non-pressure chronic ulcer of unspecified heel and midfoot with unspecified severity: Secondary | ICD-10-CM

## 2011-10-06 DIAGNOSIS — F09 Unspecified mental disorder due to known physiological condition: Secondary | ICD-10-CM

## 2011-10-06 DIAGNOSIS — E46 Unspecified protein-calorie malnutrition: Secondary | ICD-10-CM

## 2011-10-06 DIAGNOSIS — L89309 Pressure ulcer of unspecified buttock, unspecified stage: Secondary | ICD-10-CM

## 2011-10-06 DIAGNOSIS — F32A Depression, unspecified: Secondary | ICD-10-CM

## 2011-10-06 LAB — CBC
MCH: 31.3 pg (ref 26.0–34.0)
MCV: 94.3 fL (ref 78.0–100.0)
Platelets: 471 10*3/uL — ABNORMAL HIGH (ref 150–400)
RBC: 3 MIL/uL — ABNORMAL LOW (ref 3.87–5.11)

## 2011-10-06 LAB — BASIC METABOLIC PANEL
CO2: 27 mEq/L (ref 19–32)
Calcium: 9 mg/dL (ref 8.4–10.5)
Creatinine, Ser: 0.45 mg/dL — ABNORMAL LOW (ref 0.50–1.10)
Glucose, Bld: 95 mg/dL (ref 70–99)

## 2011-10-06 MED ORDER — ALPRAZOLAM 0.25 MG PO TABS: 0.2500 mg | ORAL_TABLET | Freq: Every evening | ORAL | Status: DC | PRN

## 2011-10-06 MED ORDER — OXYCODONE-ACETAMINOPHEN 5-325 MG PO TABS: 1.0000 | ORAL_TABLET | Freq: Four times a day (QID) | ORAL | Status: DC | PRN

## 2011-10-06 NOTE — Progress Notes (Signed)
CSW spoke with son who is still agreeable to [facility]. CSW called SNF who has available bed. CSW informed MD of bed. CSW will continue to follow. [place], [place] [phone]

## 2011-10-06 NOTE — Progress Notes (Signed)
CSW faxed dc summary and medications to SNF. SNF agreeable to admission and stated son had completed paperwork. CSW prepared dc packet. CSW spoke with son who wishes to transport patient to SNF. CSW informed RN of dc and RN will give chart copy to son. CSW is signing off. [place], [place] [phone]

## 2011-10-06 NOTE — Progress Notes (Signed)
Patient discharged to [facility] and transported via son's private car. IV d/c'd with cath intact. Foley to stay per MD order. Assessment unchanged from this am.

## 2011-10-06 NOTE — Progress Notes (Signed)
CSW received call from RN that patient's son was not comfortable transporting and preferred [facility]. CSW coordinated transportation via [facility]. CSW is signing off. [place], [place] [phone]

## 2011-10-06 NOTE — Discharge Summary (Signed)
Physician Discharge Summary  [name] MRN: [id number] DOB/AGE: 01/26/1946 65 y.o.  PCP: Hollice Espy, MD, MD   Admit date: 09/22/2011 Discharge date: 10/06/2011  Discharge Diagnoses:  Chronic pain syndrome  *Failure to thrive Urinary tract infection  Multiple sclerosis  Hypertension  Depression  Organic brain syndrome  Decubitus ulcer, buttock  Heel ulceration  Fibroid uterus  Abdominal pain, lower  Constipation Decubitus ulcer, buttock it is stage II.    Current Discharge Medication List    START taking these medications   Details  oxyCODONE-acetaminophen (PERCOCET) 5-325 MG per tablet Take 1-2 tablets by mouth every 6 (six) hours as needed. Qty: 30 tablet, Refills: 0      CONTINUE these medications which have CHANGED   Details  ALPRAZolam (XANAX) 0.25 MG tablet Take 1 tablet (0.25 mg total) by mouth at bedtime as needed. For anxiety  Qty: 30 tablet, Refills: 0      CONTINUE these medications which have NOT CHANGED   Details  acetaminophen (TYLENOL) 500 MG tablet Take 500 mg by mouth every 6 (six) hours as needed. For pain.     amLODipine (NORVASC) 5 MG tablet Take 10 mg by mouth daily.     atorvastatin (LIPITOR) 40 MG tablet Take 40 mg by mouth daily.      baclofen (LIORESAL) 10 MG tablet Take 10 mg by mouth 3 (three) times daily.      buPROPion (WELLBUTRIN XL) 300 MG 24 hr tablet Take 300 mg by mouth daily.      carbamazepine (TEGRETOL) 200 MG tablet Take 200 mg by mouth 3 (three) times daily.      donepezil (ARICEPT) 5 MG tablet Take 5 mg by mouth daily.     fish oil-omega-3 fatty acids 1000 MG capsule Take 2 g by mouth 3 (three) times daily.      interferon beta-1a (AVONEX) 30 MCG/0.5ML injection Inject 30 mcg into the muscle every 7 (seven) days.     labetalol (NORMODYNE) 100 MG tablet Take 100 mg by mouth 2 (two) times daily.      losartan-hydrochlorothiazide (HYZAAR) 50-12.5 MG per tablet Take 1 tablet by mouth daily.        Multiple Vitamins-Minerals (MULTIVITAMINS THER. W/MINERALS) TABS Take 1 tablet by mouth daily.      potassium chloride SA (K-DUR,KLOR-CON) 20 MEQ tablet Take 20 mEq by mouth 3 (three) times daily.      tolterodine (DETROL LA) 2 MG 24 hr capsule Take 2 mg by mouth daily.      traMADol (ULTRAM) 50 MG tablet Take 50 mg by mouth every 6 (six) hours as needed. Maximum dose= 8 tablets per day. For pain        STOP taking these medications     naproxen sodium (ANAPROX) 220 MG tablet         Discharge Condition: Stable Disposition: Needs   Consults none   Significant Diagnostic Studies: Dg Chest 2 View  09/22/2011  *RADIOLOGY REPORT*  Clinical Data: Chest pain.  Abdominal pain.  Weakness.  CHEST - 2 VIEW  Comparison: 07/25/2009  Findings: The cardiopericardial silhouette is enlarged. The lungs are clear without focal infiltrate, edema, pneumothorax or pleural effusion.  Right paratracheal density is stable in the interval and also when comparing back to 04/04/2006.  This likely represents ectasia of large vessel anatomy. Imaged bony structures of the thorax are intact.  IMPRESSION: Stable.  No acute cardiopulmonary process.  Original Report Authenticated By: ERIC A. MANSELL, M.D.   Dg  Abd 1 View  09/28/2011  *RADIOLOGY REPORT*  Clinical Data: Abdominal pain and bloating  ABDOMEN - 1 VIEW  Comparison: 09/27/2011  Findings: There is diffuse gas-filled large and small bowel in the abdomen with out evidence for obstruction.  Contrast material is become further concentrated in the left colon.  Large calcified central pelvic mass again noted.  IMPRESSION: No evidence for bowel obstruction.  No substantial stool volume in the left colon.  Large calcified pelvic mass.  Original Report Authenticated By: ERIC A. MANSELL, M.D.   Dg Abd 1 View  09/27/2011  *RADIOLOGY REPORT*  Clinical Data: Abdominal pain and bloating  ABDOMEN - 1 VIEW  Comparison: CT of 09/22/2011.  Findings: Single supine view of  the abdomen and pelvis.  Gas filled small bowel loops are upper normal in caliber.  Gas and stool filled colon is normal in caliber.  Distal gas identified.  Large calcified mass as detailed on recent CT within the central pelvis. Vascular calcifications.  No pneumatosis or free intraperitoneal air.  IMPRESSION: Gas filled small bowel loops at upper normal size.  Question mild adynamic ileus.  No obstruction or free intraperitoneal air.  Large calcified pelvic mass, as detailed on the 09/22/2011 CT report.  Original Report Authenticated By: Consuello Bossier, M.D.   Mr [name] Contrast  09/23/2011  *RADIOLOGY REPORT*  Clinical Data:  History multiple sclerosis.  Failure to thrive.  MRI HEAD WITHOUT AND WITH CONTRAST MRI CERVICAL SPINE WITHOUT AND WITH CONTRAST  Technique:  Multiplanar, multiecho pulse sequences of the brain and surrounding structures, and cervical spine, to include the craniocervical junction and cervicothoracic junction, were obtained without and with intravenous contrast.  Contrast: 15mL MULTIHANCE GADOBENATE DIMEGLUMINE 529 MG/ML IV SOLN  Comparison:  01/13/2011 head CT.  07/23/2009 MR cervical spine and MR brain.  MRI HEAD  Findings:  No acute infarct.  No intracranial hemorrhage.  Marked white matter type changes consistent with the patient's history of multiple sclerosis with mild progression since prior exam.  None of these areas demonstrates restricted motion or enhancement as can be seen in areas of active demyelination.  Global atrophy without hydrocephalus.  Major intracranial vascular structures are patent.  No intracranial mass or abnormal enhancement noted on this motion degraded exam.  IMPRESSION: No acute infarct.  Marked white matter type changes consistent with the patient's history of multiple sclerosis with mild progression since prior exam.  None of these areas demonstrates restricted motion or enhancement as can be seen in areas of active demyelination.  Global atrophy  without hydrocephalus.  MRI CERVICAL SPINE  Findings: Motion degraded exam.  Regions of altered signal intensity involving the cord consistent with areas of demyelination involving; left aspect cervical medullary junction, right paracentral C2 level, left C3 level, centrally C4-5 level and extending from C6  to T4 level.  None of these areas demonstrates enhancement as can be seen with areas of active demyelination.  Appearance is relatively similar to the prior exam.  C2-3:  Minimal foraminal narrowing.  C3-4:  Facet joint degenerative changes.  Mild bulge.  Mild bilateral foraminal narrowing mild spinal stenosis.  C4-5:  Shallow central protrusion.  Mild facet joint degenerative changes.  Mild spinal stenosis and bilateral foraminal narrowing.  C5-6:  Mild bulge greater to the right.  Mild spinal stenosis. Mild bilateral foraminal narrowing.  C6-7:  Minimal bulge and spur.  Mild bilateral foraminal narrowing.  C7-T1:  Negative.  IMPRESSION: Motion degraded exam.  Areas of chronic demyelination involving the  cervical cord and thoracic cord appear relatively similar to the prior exam.  Cervical spondylotic changes without significant change.  Please see above.  Original Report Authenticated By: Fuller Canada, M.D.   Mr Cervical Spine W Wo Contrast  09/23/2011  *RADIOLOGY REPORT*  Clinical Data:  History multiple sclerosis.  Failure to thrive.  MRI HEAD WITHOUT AND WITH CONTRAST MRI CERVICAL SPINE WITHOUT AND WITH CONTRAST  Technique:  Multiplanar, multiecho pulse sequences of the brain and surrounding structures, and cervical spine, to include the craniocervical junction and cervicothoracic junction, were obtained without and with intravenous contrast.  Contrast: 15mL MULTIHANCE GADOBENATE DIMEGLUMINE 529 MG/ML IV SOLN  Comparison:  01/13/2011 head CT.  07/23/2009 MR cervical spine and MR brain.  MRI HEAD  Findings:  No acute infarct.  No intracranial hemorrhage.  Marked white matter type changes consistent  with the patient's history of multiple sclerosis with mild progression since prior exam.  None of these areas demonstrates restricted motion or enhancement as can be seen in areas of active demyelination.  Global atrophy without hydrocephalus.  Major intracranial vascular structures are patent.  No intracranial mass or abnormal enhancement noted on this motion degraded exam.  IMPRESSION: No acute infarct.  Marked white matter type changes consistent with the patient's history of multiple sclerosis with mild progression since prior exam.  None of these areas demonstrates restricted motion or enhancement as can be seen in areas of active demyelination.  Global atrophy without hydrocephalus.  MRI CERVICAL SPINE  Findings: Motion degraded exam.  Regions of altered signal intensity involving the cord consistent with areas of demyelination involving; left aspect cervical medullary junction, right paracentral C2 level, left C3 level, centrally C4-5 level and extending from C6  to T4 level.  None of these areas demonstrates enhancement as can be seen with areas of active demyelination.  Appearance is relatively similar to the prior exam.  C2-3:  Minimal foraminal narrowing.  C3-4:  Facet joint degenerative changes.  Mild bulge.  Mild bilateral foraminal narrowing mild spinal stenosis.  C4-5:  Shallow central protrusion.  Mild facet joint degenerative changes.  Mild spinal stenosis and bilateral foraminal narrowing.  C5-6:  Mild bulge greater to the right.  Mild spinal stenosis. Mild bilateral foraminal narrowing.  C6-7:  Minimal bulge and spur.  Mild bilateral foraminal narrowing.  C7-T1:  Negative.  IMPRESSION: Motion degraded exam.  Areas of chronic demyelination involving the cervical cord and thoracic cord appear relatively similar to the prior exam.  Cervical spondylotic changes without significant change.  Please see above.  Original Report Authenticated By: Fuller Canada, M.D.   Ct Abdomen Pelvis W  Contrast  09/22/2011  *RADIOLOGY REPORT*  Clinical Data: Left lower quadrant pain. No comparison studies available.  CT ABDOMEN AND PELVIS WITH CONTRAST  Technique:  Multidetector CT imaging of the abdomen and pelvis was performed following the standard protocol during bolus administration of intravenous contrast.  Contrast: 80mL OMNIPAQUE IOHEXOL 300 MG/ML IV SOLN  Comparison: None.  Findings: Tiny low density lesion in the dome of the liver is too small to characterize, it is probably a cyst.  The spleen is normal.  The stomach, duodenum, pancreas, gallbladder, and adrenal glands are normal.  Kidneys have normal imaging features.  No abdominal aortic aneurysm.  There is no lymphadenopathy or free fluid in the abdomen.  No evidence for bowel obstruction.  Imaging through the pelvis shows a 7.8 x 10.1 x 6.0 cm calcified mass in the central pelvis.  This appears to  be arising from the uterus. No evidence for adnexal mass.  No free fluid in the pelvis.  The calcified mass generates mass effect on the bladder.  There appears to be some uterine tissue along the left anterolateral aspect of the calcified lesion.  No evidence for colonic diverticulitis.  The terminal ileum is normal.  The appendix is normal. Bone windows reveal no worrisome lytic or sclerotic osseous lesions.  IMPRESSION: Large calcified mass in the central pelvis appears to arise from the uterus although the relationship between the uterus and this calcified mass is not well seen.  The calcified lesion is probably a large degenerated posterior uterine fibroid.  A follow up non emergent pelvic ultrasound or MRI could be used confirm the location of this lesion.  Otherwise no acute findings in the abdomen or pelvis.  Original Report Authenticated By: ERIC A. MANSELL, M.D.   Ct Maxillofacial W/cm  09/27/2011  *RADIOLOGY REPORT*  Clinical Data: Pain, evaluate for abscess  CT MAXILLOFACIAL WITH CONTRAST  Technique:  Multidetector CT imaging of the  maxillofacial structures was performed with intravenous contrast. Multiplanar CT image reconstructions were also generated.  Contrast: 80mL OMNIPAQUE IOHEXOL 300 MG/ML IV SOLN  Comparison: MRI brain dated 09/23/2011  Findings: The visualized paranasal sinuses are essentially clear. The mastoid air cells are unopacified.  The bilateral orbits, including the retroconal soft tissues, are within normal limits.  No soft tissue abscess is seen.  The visualized thyroid is unremarkable.  Degenerative changes of the upper cervical spine without evidence of fracture to C6.  The visualized brain parenchyma is within normal limits.  IMPRESSION: Normal maxillofacial CT.  Original Report Authenticated By: Charline Bills, M.D.   Dg Chest Port 1 View  09/28/2011  *RADIOLOGY REPORT*  Clinical Data: Fever  PORTABLE CHEST - 1 VIEW  Comparison: 09/22/2011  Findings: Mild perihilar interstitial prominence, possibly reflecting mild interstitial edema.  No pleural effusion or pneumothorax.  Stable mild cardiomegaly.  IMPRESSION: Stable cardiomegaly with possible mild interstitial edema.  Original Report Authenticated By: Charline Bills, M.D.      Microbiology: Recent Results (from the past 240 hour(s))  CULTURE, BLOOD (ROUTINE X 2)     Status: Normal   Collection Time   09/28/11  1:10 AM      Component Value Range Status Comment   Specimen Description BLOOD RIGHT ARM   Final    Special Requests BOTTLES DRAWN AEROBIC ONLY 5CC   Final    Setup Time [id number]   Final    Culture NO GROWTH 5 DAYS   Final    Report Status 10/04/2011 FINAL   Final   CULTURE, BLOOD (ROUTINE X 2)     Status: Normal   Collection Time   09/28/11  1:20 AM      Component Value Range Status Comment   Specimen Description BLOOD RIGHT ARM   Final    Special Requests BOTTLES DRAWN AEROBIC ONLY 5CC   Final    Setup Time [id number]   Final    Culture NO GROWTH 5 DAYS   Final    Report Status 10/04/2011 FINAL   Final   URINE CULTURE      Status: Normal   Collection Time   09/28/11  2:18 AM      Component Value Range Status Comment   Specimen Description URINE, CATHETERIZED   Final    Special Requests augmentin Normal   Final    Setup Time [id number]   Final    Colony Count NO GROWTH  Final    Culture NO GROWTH   Final    Report Status 09/29/2011 FINAL   Final      Labs: Results for orders placed during the hospital encounter of 09/22/11 (from the past 48 hour(s))  CBC     Status: Abnormal   Collection Time   10/06/11  5:25 AM      Component Value Range Comment   WBC 9.3  4.0 - 10.5 (K/uL)    RBC 3.00 (*) 3.87 - 5.11 (MIL/uL)    Hemoglobin 9.4 (*) 12.0 - 15.0 (g/dL)    HCT 11.9 (*) 14.7 - 46.0 (%)    MCV 94.3  78.0 - 100.0 (fL)    MCH 31.3  26.0 - 34.0 (pg)    MCHC 33.2  30.0 - 36.0 (g/dL)    RDW 82.9  56.2 - 13.0 (%)    Platelets 471 (*) 150 - 400 (K/uL)   BASIC METABOLIC PANEL     Status: Abnormal   Collection Time   10/06/11  5:25 AM      Component Value Range Comment   Sodium 135  135 - 145 (mEq/L)    Potassium 4.1  3.5 - 5.1 (mEq/L)    Chloride 99  96 - 112 (mEq/L)    CO2 27  19 - 32 (mEq/L)    Glucose, Bld 95  70 - 99 (mg/dL)    BUN 8  6 - 23 (mg/dL)    Creatinine, Ser 8.65 (*) 0.50 - 1.10 (mg/dL)    Calcium 9.0  8.4 - 10.5 (mg/dL)    GFR calc non Af Amer >90  >90 (mL/min)    GFR calc Af Amer >90  >90 (mL/min)      HPI :65yoF with h/o multiple sclerosis, organic brain syndrome, HTN, trigeminal neuralgia, and fibroids  presents with failure to thrive, buttock and heel ulcers, abdominal pain and large fibroid, and  son requesting more help at home.  History mostly gathered from the son [name] who is quite attentive and impressive in his care of his  mother. He states that for the past several months she is overall decompensating -- more immobile  and bedbound, more confused and mentally "wonky," developing R heel and R buttock sore, decreased  appetite and PO intake, weight loss, and having  abdominal pain. Whereas a couple months ago she  was able to ambulate in the house with a walker, now she is in bed all day long, and can't move,  such that sores have developed that he has tried to address but just physically can't move her  enough to keep her off her back. Her mental status has gotten worse such that she will just repeat  the same things over and over, not answer questions appropriately. Since October, having low  midline abdominal pain, off/on, and not clearly related to eating or having BM's which have always  been irregular anyways, and he notes last BM was Monday, with incontinence (i.e. overall bowel  habits are not changed). She is also urinarily incontinent. She has lost 40 lbs since 12/2010.  They do get VNA every Wednesday, who give Avenex shot, and some type of [place] case management  program who give 10 hrs / wk, from Mon-Thurs 12-230pm, but he feels even with this help he's not  able to manage all of her needs, and is asking for more help.  In the ED, vitals were stable and normal. Chemistry panel normal, renal 16/0.68. LFT's normal.  Trop negative x1,  CBC normal. UA negative. CXR with enlarged cardiopericardial silhouette, right  paratracheal density stable since 03/2006 (likely large vessed ectasia), overall stable and non-  acute. CTAP was done for incidental LLQ pain while in the ED which showed large calcified mass in  the central pelvis, possibly arising from uterus but not wholly clear, felt probably due to large  degenerated posterior uterine fibroid, recommended follow up non-emergent pelvic ultrasound or  MRI.   HOSPITAL COURSE:    #1Failure to thrive - multifactorial. Secondary to multiple sclerosis, exacerbated by underlying depression, and chronic pain issues. She was also found to have fever, secondary to urine tract infection. She was also found to have chronic right ear pain, and neck pain. She was also found to have dysphagia and was started   Dysphagia diet and add Ensure TID. Swallow evaluation demonstrated significant pain during mastication. She also complained of dental pain, and due to concern about a dental abscess a CT scan was done. However No evidence of dental abscess was seen on CT maxillofacial .   #2 Multiple sclerosis -MRI showed no acute infarct but marked white matter type changes consistent with patient's history of multiple sclerosis no restricted motion or enhancement. She was restarted on Avonex.   #3 Hypertension -she was continued on her home medications.  #4 Depression he was continued on her outpatient antihypertensive regimen. Organic brain syndrome  Decubitus ulcer, buttock it is stage II.  continue DuoDERM dressing. Currently no mucopurulent drainage. Wound care consult was obtained. They do not have any further recommendations.  Fibroid uterus: Likely the primary cause of her abdominal pain. She would need an outpatient gynecologic evaluation. She had a CT scan of the abdomen and pelvis with contrast that showed,Large calcified mass in the central pelvis appears to arise from  the uterus although the relationship between the uterus and this  calcified mass is not well seen. The calcified lesion is probably  a large degenerated posterior uterine fibroid. A follow up non  emergent pelvic ultrasound or MRI could be used confirm the  location of this lesion.    Abdominal pain, lower -was thought to be secondary to constipation - she was given Dulcolax Senokot S and add Mirilax. X-ray shows mild adynamic ileus, no obstruction. She would need an aggressive constipation regimen will stop  Fever initially thought to be secondary to aspiration, vancomycin and Zosyn were started. Speech therapy assessment was that the Pt was having significant mandibular pain with mastication and cold beverages. Pt points to her ear/TMJ area to identify painful site. Pt functional with PO intake though SLP recommends a Dysphagia 2  diet with tepid/warm liquids for comfort.   Vancomycin was discontinued after 2 days because of drug-induced fever. Potential source could be her buttock, where she has a stage II decubitus ulcer. However per wound care there was no obvious drainage and there was no area of cellulitis. Chest x-ray does not show any pneumonia. She did have an abnormal urine analysis. However the urine culture was negative. She was subsequently transitioned to Augmentin after 2 days of IV antibiotics. Augmentin was continued up until the 27th and then stopped. She has remained afebrile thus far.  Disposition and would be to go to a skilled nursing facility, possible discharge on the 27th.      Discharge Exam:  Blood pressure 120/51, pulse 81, temperature 98.7 F (37.1 C), temperature source Oral, resp. rate 18, height 5\' 4"  (1.626 m), weight 67.223 kg (148 lb 3.2 oz), SpO2 99.00%.  General: Alert, awake, oriented to self and place, in no acute distress. HEENT: No bruits, no goiter. Heart: Regular rate and rhythm, without murmurs, rubs, gallops. Lungs: Clear to auscultation bilaterally. Abdomen: Soft, nontender, nondistended, positive bowel sounds. Extremities: No clubbing cyanosis or edema with positive pedal pulses. Neuro: Grossly intact, nonfocal.       Follow-up Information    Follow up with Hollice Espy, MD .         SignedRicharda Overlie 10/06/2011, 9:44 AM

## 2011-10-07 ENCOUNTER — Non-Acute Institutional Stay: Payer: Self-pay | Admitting: Family Medicine

## 2011-10-07 ENCOUNTER — Encounter: Payer: Self-pay | Admitting: Family Medicine

## 2011-10-07 DIAGNOSIS — E46 Unspecified protein-calorie malnutrition: Secondary | ICD-10-CM | POA: Insufficient documentation

## 2011-10-07 MED ORDER — OXYCODONE-ACETAMINOPHEN 5-325 MG PO TABS: 1.0000 | ORAL_TABLET | ORAL | Status: AC | PRN

## 2011-10-07 NOTE — Assessment & Plan Note (Signed)
healed 

## 2011-10-07 NOTE — Progress Notes (Signed)
  Subjective:    Patient ID: [name], female    DOB: [birth year], 65 y.o.   MRN: [id number]  [facility] [place] is [facility] after a hospitalization for debilitation. She was initially diagnosed as UTI, but the culture was negative. She has a history of dementia and her son was not available on admission day to give further details. She denies current pain but has been on chronic Percocet. She reports ambulating with a wheeled walker prior to the hospitalization.     Review of Systems     Objective:   Physical Exam  Cardiovascular: Normal rate and regular rhythm.   Pulmonary/Chest: Effort normal and breath sounds normal.  Neurological: She is alert. No cranial nerve deficit. Coordination abnormal.       Apparent defects in remote and recent memory  Some dysmetria in movements. Difficulty sitting  Normal speech rhythm    Registers 3 words, recalls 2, Oriented to city and state, but states the year is 2010 and the month, January.          Assessment & Plan:  I interviewed and examined this patient and discussed the treatment plan with Dr Gwendolyn Grant.

## 2011-10-07 NOTE — Assessment & Plan Note (Signed)
Consider changing to Mirtazepine to stimulate appetite.

## 2011-10-07 NOTE — Progress Notes (Signed)
Patient ID: [name], female   DOB: 20-Jul-1946, 65 y.o.   MRN: [id number]  Family Medicine Teaching [facility] Admission History and Physical  Patient name: [name] Medical record number: [id number] Date of birth: September 02, 1946 Age: 65 y.o. Gender: female  Primary Care Provider: Hollice Espy, MD, MD  Chief Complaint: Weakness and complications from multiple sclerosis History of Present Illness: [name] is a 65 y.o. year old female with a recent admission to [facility] for failure to thrive and complications from chronic multiple sclerosis. Patient originally admitted on September 22, 2011. Per her son she been having overall decompensation for the past several months prior to remission. This is described as the more mobile in bed bound, increasing confusion, developing decubitus ulcers, weight loss and decreased appetite. While in house she was diagnoses urinary tract infection secondary to UA and fever. She was treated with Augmentin for this. As her son states she can no longer care for her at home skilled nursing facility was deemed to be the most appropriate place for discharge for her and she was therefore discharged to [facility] nursing facility. Since being discharged in the hospital she complains of persistent weakness which is at her baseline. She denies any chest pain, shortness of breath, abdominal pain, nausea vomiting, diarrhea or changes in bowel habits.  Patient Active Problem List  Diagnoses  . Multiple sclerosis  . Hypertension  . Depression  . Organic brain syndrome  . Trigeminal neuralgia  . Failure to thrive  . Decubitus ulcer, buttock  . Fibroid uterus  . Abdominal pain, lower  . Constipation  . Protein calorie malnutrition   Past Medical History: Past Medical History  Diagnosis Date  . Multiple sclerosis     Right sided weakness and gait disorder, bladder and bowel problems   . Hypertension   . Depression   . 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 Trigeminal neuralgia   . Dyslipidemia   . Fibroids   . Overdose of muscle relaxant     Unintentional baclofen overdose     Past Surgical History: Past Surgical History  Procedure Date  . Strabismus surgery     Right eye     Social History: History   Social History  . Marital Status: Divorced    Spouse Name: N/A    Number of Children: 2  . Years of Education: 16   Occupational History  . disabled school teacher    Social History Main Topics  . Smoking status: Never Smoker   . Smokeless tobacco: Not on file  . Alcohol Use: No  . Drug Use: No  . Sexually Active: No   Other Topics Concern  . Not on file   Social History Narrative   Divorced, has a son and daughter. [name] is very involved in her care. [id number] [phone]    Family History: Family History  Problem Relation Age of Onset  . Hypertension    . Heart disease    . Uterine cancer      Allergies: Allergies  Allergen Reactions  . Shellfish Allergy     Hives     Current Outpatient Prescriptions  Medication Sig Dispense Refill  . acetaminophen (TYLENOL) 500 MG tablet Take 500 mg by mouth every 6 (six) hours as needed. For pain.       Marland Kitchen ALPRAZolam (XANAX) 0.25 MG tablet Take 1 tablet (0.25 mg total) by mouth at bedtime as  needed. For anxiety   30 tablet  0  . amLODipine (NORVASC) 5 MG tablet Take 10 mg by mouth daily.       Marland Kitchen atorvastatin (LIPITOR) 40 MG tablet Take 40 mg by mouth daily.        . baclofen (LIORESAL) 10 MG tablet Take 10 mg by mouth 3 (three) times daily.        Marland Kitchen buPROPion (WELLBUTRIN XL) 300 MG 24 hr tablet Take 300 mg by mouth daily.        . carbamazepine (TEGRETOL) 200 MG tablet Take 200 mg by mouth 3 (three) times daily.        Marland Kitchen donepezil (ARICEPT) 5 MG tablet Take 5 mg by mouth daily.       . fish oil-omega-3 fatty acids 1000 MG capsule Take 2 g by mouth 3 (three) times daily.        . interferon beta-1a (AVONEX) 30 MCG/0.5ML injection  Inject 30 mcg into the muscle every 7 (seven) days.       Marland Kitchen labetalol (NORMODYNE) 100 MG tablet Take 100 mg by mouth 2 (two) times daily.        Marland Kitchen losartan-hydrochlorothiazide (HYZAAR) 50-12.5 MG per tablet Take 1 tablet by mouth daily.        . Multiple Vitamins-Minerals (MULTIVITAMINS THER. W/MINERALS) TABS Take 1 tablet by mouth daily.        Marland Kitchen oxyCODONE-acetaminophen (PERCOCET) 5-325 MG per tablet Take 1 tablet by mouth every 4 (four) hours as needed.  100 tablet  0  . potassium chloride SA (K-DUR,KLOR-CON) 20 MEQ tablet Take 20 mEq by mouth 3 (three) times daily.        Marland Kitchen tolterodine (DETROL LA) 2 MG 24 hr capsule Take 2 mg by mouth daily.        . traMADol (ULTRAM) 50 MG tablet Take 50 mg by mouth every 6 (six) hours as needed. Maximum dose= 8 tablets per day. For pain         No current facility-administered medications for this visit.   Facility-Administered Medications Ordered in Other Visits  Medication Dose Route Frequency Provider Last Rate Last Dose  . DISCONTD: 0.9 %  sodium chloride infusion  250 mL Intravenous PRN Carlota Raspberry, MD      . DISCONTD: acetaminophen (TYLENOL) suppository 650 mg  650 mg Rectal Q6H PRN Carlota Raspberry, MD      . DISCONTD: acetaminophen (TYLENOL) tablet 650 mg  650 mg Oral Q6H PRN Carlota Raspberry, MD   650 mg at 10/06/11 1328  . DISCONTD: ALPRAZolam Prudy Feeler) tablet 0.25 mg  0.25 mg Oral QHS PRN Carlota Raspberry, MD   0.25 mg at 10/04/11 2152  . DISCONTD: amLODipine (NORVASC) tablet 10 mg  10 mg Oral Daily Carlota Raspberry, MD   10 mg at 10/04/11 1025  . DISCONTD: amoxicillin-clavulanate (AUGMENTIN) 875-125 MG per tablet 1 tablet  1 tablet Oral Q12H Nayana Abrol   1 tablet at 10/06/11 1049  . DISCONTD: baclofen (LIORESAL) tablet 10 mg  10 mg Oral TID Carlota Raspberry, MD   10 mg at 10/06/11 1048  . DISCONTD: bisacodyl (DULCOLAX) suppository 10 mg  10 mg Rectal Daily PRN Nayana Abrol      . DISCONTD: buPROPion (WELLBUTRIN XL) 24 hr tablet 300 mg  300 mg Oral Daily Carlota Raspberry, MD   300  mg at 10/06/11 1048  . DISCONTD: carbamazepine (TEGRETOL) tablet 200 mg  200 mg Oral TID Carlota Raspberry, MD   200 mg at 10/06/11 1048  .  DISCONTD: chlorhexidine (PERIDEX) 0.12 % solution 15 mL  15 mL Mouth/Throat BID Sorin Laza   15 mL at 10/05/11 2138  . DISCONTD: docusate sodium (COLACE) capsule 100 mg  100 mg Oral BID Carlota Raspberry, MD   100 mg at 10/05/11 2139  . DISCONTD: donepezil (ARICEPT) tablet 5 mg  5 mg Oral Daily Carlota Raspberry, MD   5 mg at 10/06/11 1048  . DISCONTD: feeding supplement (ENSURE CLINICAL STRENGTH) liquid 237 mL  237 mL Oral TID WC Ripudeep Jenna Luo, MD   237 mL at 10/06/11 0829  . DISCONTD: heparin injection 5,000 Units  5,000 Units Subcutaneous Q8H Carlota Raspberry, MD   5,000 Units at 10/06/11 [phone]  . DISCONTD: interferon beta-1a (AVONEX) injection 30 mcg  30 mcg Intramuscular Weekly Dennie Fetters, RPH   30 mcg at 09/28/11 1800  . DISCONTD: labetalol (NORMODYNE) tablet 100 mg  100 mg Oral BID Carlota Raspberry, MD   100 mg at 10/05/11 2139  . DISCONTD: losartan (COZAAR) tablet 50 mg  50 mg Oral Daily Gary Fleet Abbott, PHARMD   50 mg at 10/06/11 1049  . DISCONTD: morphine 2 MG/ML injection 1 mg  1 mg Intravenous Q4H PRN Maren Reamer, NP   1 mg at 10/04/11 1040  . DISCONTD: mulitivitamin with minerals tablet 1 tablet  1 tablet Oral Daily Jeoffrey Massed Poots, RD   1 tablet at 10/06/11 1049  . DISCONTD: ondansetron (ZOFRAN) injection 4 mg  4 mg Intravenous Q6H PRN Carlota Raspberry, MD      . DISCONTD: ondansetron (ZOFRAN) tablet 4 mg  4 mg Oral Q6H PRN Carlota Raspberry, MD      . DISCONTD: oxyCODONE-acetaminophen (PERCOCET) 5-325 MG per tablet 1-2 tablet  1-2 tablet Oral Q6H PRN Ripudeep Jenna Luo, MD   2 tablet at 10/04/11 0801  . DISCONTD: polyethylene glycol (MIRALAX / GLYCOLAX) packet 17 g  17 g Oral BID Ripudeep Jenna Luo, MD   17 g at 10/05/11 2140  . DISCONTD: rosuvastatin (CRESTOR) tablet 20 mg  20 mg Oral Daily Carlota Raspberry, MD   20 mg at 10/06/11 1048  . DISCONTD: senna (SENOKOT) tablet 8.6 mg  1  tablet Oral BID Carlota Raspberry, MD   8.6 mg at 10/06/11 1049  . DISCONTD: sodium chloride 0.9 % injection 3 mL  3 mL Intravenous Q12H Carlota Raspberry, MD   3 mL at 10/06/11 1048  . DISCONTD: sodium chloride 0.9 % injection 3 mL  3 mL Intravenous PRN Carlota Raspberry, MD      . DISCONTD: tolterodine (DETROL LA) 24 hr capsule 2 mg  2 mg Oral Daily Carlota Raspberry, MD   2 mg at 10/06/11 1048  . DISCONTD: traMADol (ULTRAM) tablet 50 mg  50 mg Oral Q6H Nayana Abrol   50 mg at 10/06/11 9604   Review Of Systems: Per HPI with the following additions: none  Otherwise 12 point review of systems was performed and was unremarkable.  Physical Exam:            General: alert, cooperative, appears stated age and no distress HEENT: PERRLA, extra ocular movement intact, sclera clear, anicteric and oropharynx clear, no lesions.  Cataracts bilaterally.  TMs clear and ear canals without erythema and only minimal cerum bilaterally.  Neck:  No thryomegaly, no cervical LAD Heart: S1, S2 normal, no murmur, rub or gallop, regular rate and rhythm Lungs: clear to auscultation, no wheezes or rales and unlabored breathing Abdomen: abdomen is soft without significant tenderness, masses, organomegaly or  guarding Extremities: No edema bilaterally. Patient wearing padded boot Right foot Skin: HealingGrade II decubitus ulcer noted Right heel.  Stage II decubitus ulcer with Duoderm dressing sacral region.   Neurology: Alert and oriented to person and place.  Oriented to year as 2010 and month as January.  States Christmas was 2 weeks ago, actually 2 days ago.  No sensory deficits throughout.  Strength 3/5 BL LE's.  Strength 4/5 BL UE's.  Refused Gait exam or Romberg testing.  Poor finger to nose testing, perseveration noted.  Slow rapid alternating movement testing noted.  Recall 3/3 immediate and 2/3 5 minutes later.  Some apparent memory deficits noted however upon further exploration of social and educational history   Labs and Imaging: Lab  Results  Component Value Date/Time   NA 135 10/06/2011  5:25 AM   K 4.1 10/06/2011  5:25 AM   CL 99 10/06/2011  5:25 AM   CO2 27 10/06/2011  5:25 AM   BUN 8 10/06/2011  5:25 AM   CREATININE 0.45* 10/06/2011  5:25 AM   GLUCOSE 95 10/06/2011  5:25 AM   Lab Results  Component Value Date   WBC 9.3 10/06/2011   HGB 9.4* 10/06/2011   HCT 28.3* 10/06/2011   MCV 94.3 10/06/2011   PLT 471* 10/06/2011    Assessment and Plan: [name] is a 65 y.o. year old female being admitted to [facility] skilled nursing facility secondary to complications from multiple sclerosis including failure to thrive: 1. Failure to thrive:  Patient with decreased appetite per her own report. She was started on this patient diet while in house but upon further review the records it seems she simply had tooth pain with mastication rather than any actual difficulty with swallowing. Likely benefit from speech therapy evaluation here her labs. No evidence of dental abscess seen on CT while in house. Patient may also benefit by addition of Remeron to medication regimen. There is some question of depression the patient is well Remeron for depression as well as increasing appetite. 2.  Multiple sclerosis. Patient had MRI while in house which showed chronic markedly white matter changes with no acute infarcts. She was on Avonex while in house. We will need to follow this as she receives monthly injections of interferon. We will continue daily physical therapy to help her gain strength.  She did have marked difficulty with cerebellar testing.   3.  Organic brain disease: This is in the past been attributed to complications from multiple sclerosis. She's I did start him on Aricept. Question whether this will achieve any results if her dementia is secondary to multiple sclerosis. She would likely benefit from stopping tolterodine..  4.  Hypertension: Patient was discharged home on Hyzaar. Plan to continue this while at [facility].    5.  Decubitus ulcer:  Healing previous stage II ulcer of Right heel.  Plan to continue to watch for continued healing.  Sacral decubitus ulcer present prior to admit to hospital.  Continue duoderm dressing changes and proper wound care for this.   6.  Drug fever:  Patient with fever in house which was originally attributed to UTI and later thought to be secondary to vancomycin induced drug fever.  Fever resolved after stopping Vanc.   7.  Abdominal pain:  Patient had trouble with abdominal pain while in house.  At the be secondary to constipation. She was treated with Senokot, Dulcolax, and MiraLax. She'll set a CT scan of the abdomen showed a large calcified fibroid  uterus. Continue to watch for any problems with constipation and keep her on a daily stool regimen. 8 Functional status:  Patient with marked weakness on exam as well as memory deficits.  She is currently here for rehabilitation, we will need to discuss with family members how long she plans to be in Skilled Nursing Facility.  She lived with her son prior to admission to hospital.  We will work to increase her strength and help with balance and coordination during her stay at [facility].    [facility] 10/07/2011

## 2011-10-07 NOTE — Assessment & Plan Note (Addendum)
May be element of delirium. Will do MMSE next week. Then consider increasing Donepezil. Stop Tolterodine due to anticholinergic effect.

## 2011-10-07 NOTE — Assessment & Plan Note (Addendum)
Repeat albumin and prealbumin next week. Also check Vitamin D level since likely little sun exposure

## 2011-10-07 NOTE — Assessment & Plan Note (Signed)
Covered with hydrocolloid dressing. Stage 2 by report from hospital.

## 2011-10-11 DIAGNOSIS — R279 Unspecified lack of coordination: Secondary | ICD-10-CM | POA: Diagnosis not present

## 2011-10-11 DIAGNOSIS — Z87440 Personal history of urinary (tract) infections: Secondary | ICD-10-CM | POA: Diagnosis not present

## 2011-10-11 DIAGNOSIS — R627 Adult failure to thrive: Secondary | ICD-10-CM | POA: Diagnosis not present

## 2011-10-11 DIAGNOSIS — Z5189 Encounter for other specified aftercare: Secondary | ICD-10-CM | POA: Diagnosis not present

## 2011-10-11 DIAGNOSIS — R488 Other symbolic dysfunctions: Secondary | ICD-10-CM | POA: Diagnosis not present

## 2011-10-11 DIAGNOSIS — I10 Essential (primary) hypertension: Secondary | ICD-10-CM | POA: Diagnosis not present

## 2011-10-11 DIAGNOSIS — N859 Noninflammatory disorder of uterus, unspecified: Secondary | ICD-10-CM | POA: Diagnosis not present

## 2011-10-11 DIAGNOSIS — K5900 Constipation, unspecified: Secondary | ICD-10-CM | POA: Diagnosis not present

## 2011-10-11 DIAGNOSIS — F329 Major depressive disorder, single episode, unspecified: Secondary | ICD-10-CM | POA: Diagnosis not present

## 2011-10-11 DIAGNOSIS — L8992 Pressure ulcer of unspecified site, stage 2: Secondary | ICD-10-CM | POA: Diagnosis not present

## 2011-10-11 DIAGNOSIS — G894 Chronic pain syndrome: Secondary | ICD-10-CM | POA: Diagnosis not present

## 2011-10-11 DIAGNOSIS — L97409 Non-pressure chronic ulcer of unspecified heel and midfoot with unspecified severity: Secondary | ICD-10-CM | POA: Diagnosis not present

## 2011-10-11 DIAGNOSIS — L89309 Pressure ulcer of unspecified buttock, unspecified stage: Secondary | ICD-10-CM | POA: Diagnosis not present

## 2011-10-11 DIAGNOSIS — G35 Multiple sclerosis: Secondary | ICD-10-CM | POA: Diagnosis not present

## 2011-10-11 DIAGNOSIS — F079 Unspecified personality and behavioral disorder due to known physiological condition: Secondary | ICD-10-CM | POA: Diagnosis not present

## 2011-10-11 DIAGNOSIS — M6281 Muscle weakness (generalized): Secondary | ICD-10-CM | POA: Diagnosis not present

## 2011-11-02 ENCOUNTER — Non-Acute Institutional Stay: Payer: Self-pay | Admitting: Family Medicine

## 2011-11-02 DIAGNOSIS — K0889 Other specified disorders of teeth and supporting structures: Secondary | ICD-10-CM | POA: Insufficient documentation

## 2011-11-02 NOTE — Assessment & Plan Note (Addendum)
Last maxillary molar on the right with gum pocket, significant gingivitis. Concern for her dental abscess. Plan: Amoxicillin for 10 days. Ibuprofen when necessary for pain Dr. Cyndie Chime will contact patient's son to arrange outpatient dentist consultation. Followup next week to ensure her pain is under control, and that plans for a dental consultation have been arranged. Basic metabolic panel to check creatinine with start of NSAID.

## 2011-11-02 NOTE — Progress Notes (Signed)
  Subjective:    Patient ID: [name], female    DOB: 21-Sep-1946, 66 y.o.   MRN: [id number]  HPI Tooth pain.  Several days of right upper back, tooth pain, worse with cold liquids. No fever. Has seen dentist in past. Pain radiates into her right cheek. No difficulty eating, not like her trigeminal neuralgia.  Patient denies muscle spasms. She is sleeping well.  She does not smoke  Medication list reviewed  Review of Systems see history of present illness     Objective:   Physical Exam  Vital signs review. Gen.: No acute distress, lying in bed holding her right hand over her right side of face. HEENT: Gingiva undermining at last right upper maxillary molar at last over gingivitis present. No overt edema or swelling about the gum line. No pain to percussion of posterior maxillary molar.  No preauricular or cervical lymphadenopathy. No tenderness to percussion over maxillary sinuses.      Assessment & Plan:

## 2011-11-03 ENCOUNTER — Non-Acute Institutional Stay: Payer: Self-pay | Admitting: Family Medicine

## 2011-11-03 ENCOUNTER — Other Ambulatory Visit: Payer: Self-pay | Admitting: Family Medicine

## 2011-11-03 MED ORDER — AMOXICILLIN 500 MG PO CAPS: 500.0000 mg | ORAL_CAPSULE | Freq: Three times a day (TID) | ORAL | Status: AC

## 2011-11-03 MED ORDER — BACLOFEN 10 MG PO TABS: 10.0000 mg | ORAL_TABLET | Freq: Three times a day (TID) | ORAL | Status: DC

## 2011-11-03 MED ORDER — TOLTERODINE TARTRATE ER 2 MG PO CP24: 2.0000 mg | ORAL_CAPSULE | Freq: Every day | ORAL | Status: DC

## 2011-11-03 MED ORDER — ACETAMINOPHEN 500 MG PO TABS: 500.0000 mg | ORAL_TABLET | Freq: Three times a day (TID) | ORAL | Status: DC

## 2011-11-03 MED ORDER — BUPROPION HCL ER (XL) 300 MG PO TB24: 300.0000 mg | ORAL_TABLET | Freq: Every day | ORAL | Status: DC

## 2011-11-03 MED ORDER — CARBAMAZEPINE 200 MG PO TABS: 200.0000 mg | ORAL_TABLET | Freq: Three times a day (TID) | ORAL | Status: DC

## 2011-11-03 MED ORDER — OMEGA-3 FATTY ACIDS 1000 MG PO CAPS: 2.0000 g | ORAL_CAPSULE | Freq: Three times a day (TID) | ORAL | Status: DC

## 2011-11-03 MED ORDER — ALPRAZOLAM 0.25 MG PO TABS: 0.2500 mg | ORAL_TABLET | Freq: Every evening | ORAL | Status: DC | PRN

## 2011-11-03 MED ORDER — LOSARTAN POTASSIUM-HCTZ 50-12.5 MG PO TABS: 1.0000 | ORAL_TABLET | Freq: Every day | ORAL | Status: DC

## 2011-11-03 MED ORDER — LABETALOL HCL 100 MG PO TABS: 100.0000 mg | ORAL_TABLET | Freq: Two times a day (BID) | ORAL | Status: DC

## 2011-11-03 MED ORDER — INTERFERON BETA-1A 30 MCG/0.5ML IM KIT: 30.0000 ug | PACK | INTRAMUSCULAR | Status: DC

## 2011-11-03 MED ORDER — DONEPEZIL HCL 5 MG PO TABS: 5.0000 mg | ORAL_TABLET | Freq: Every day | ORAL | Status: DC

## 2011-11-03 MED ORDER — AMLODIPINE BESYLATE 10 MG PO TABS: 10.0000 mg | ORAL_TABLET | Freq: Every day | ORAL | Status: DC

## 2011-11-03 MED ORDER — TRAMADOL HCL 50 MG PO TABS: 50.0000 mg | ORAL_TABLET | Freq: Four times a day (QID) | ORAL | Status: DC | PRN

## 2011-11-03 NOTE — Progress Notes (Unsigned)
Physician Discharge Summary  Patient ID: [name] MRN: [id number] DOB/AGE: [birth year] 66 y.o.  Admit date: 09/22/11 Discharge date: 11/03/2011  Admission Diagnoses: Chronic pain syndrome  *Failure to thrive  Urinary tract infection  Multiple sclerosis  Hypertension  Depression  Organic brain syndrome  Decubitus ulcer, buttock  Heel ulceration  Fibroid uterus  Abdominal pain, lower  Constipation  Decubitus ulcer, buttock it is stage II.   Discharge Diagnoses:  Active Problems:  * No active hospital problems. *    Discharged Condition: fair  Nursing Home Course:  This is a 67 YOF with a PMHx significant for MS, chronic pain, severe uterine fibroids, heel and buttock ulcers who was recently admitted to [facility] for failure to thrive. Pt was discharged to SNF for general rehabilitation and strengthening.    Treatments: {Tx:18249}  Discharge Exam:  {physical GEXB:2841324}  Disposition: Skilled Nursing Facility  Medications:  Current outpatient prescriptions:acetaminophen (TYLENOL) 500 MG tablet, Take 1 tablet (500 mg total) by mouth 3 (three) times daily. For pain., Disp: 28 tablet, Rfl: 0;  ALPRAZolam (XANAX) 0.25 MG tablet, Take 1 tablet (0.25 mg total) by mouth at bedtime as needed. For anxiety, Disp: 30 tablet, Rfl: 0;  amLODipine (NORVASC) 10 MG tablet, Take 1 tablet (10 mg total) by mouth daily., Disp: 30 tablet, Rfl: 3 amoxicillin (AMOXIL) 500 MG capsule, Take 1 capsule (500 mg total) by mouth 3 (three) times daily., Disp: 30 capsule, Rfl: 0;  atorvastatin (LIPITOR) 40 MG tablet, Take 40 mg by mouth daily.  , Disp: , Rfl: ;  baclofen (LIORESAL) 10 MG tablet, Take 1 tablet (10 mg total) by mouth 3 (three) times daily., Disp: 90 each, Rfl: 2;  buPROPion (WELLBUTRIN XL) 300 MG 24 hr tablet, Take 1 tablet (300 mg total) by mouth daily., Disp: 30 tablet, Rfl: 3 carbamazepine (TEGRETOL) 200 MG tablet, Take 1 tablet (200 mg total) by mouth 3 (three) times daily.,  Disp: 90 tablet, Rfl: 3;  donepezil (ARICEPT) 5 MG tablet, Take 1 tablet (5 mg total) by mouth daily., Disp: 30 tablet, Rfl: 3;  fish oil-omega-3 fatty acids 1000 MG capsule, Take 2 capsules (2 g total) by mouth 3 (three) times daily., Disp: 180 capsule, Rfl: 3 interferon beta-1a (AVONEX) 30 MCG/0.5ML injection, Inject 0.5 mLs (30 mcg total) into the muscle every 7 (seven) days., Disp: 1 each, Rfl: 3;  labetalol (NORMODYNE) 100 MG tablet, Take 1 tablet (100 mg total) by mouth 2 (two) times daily., Disp: 60 tablet, Rfl: 3;  losartan-hydrochlorothiazide (HYZAAR) 50-12.5 MG per tablet, Take 1 tablet by mouth daily., Disp: 30 tablet, Rfl: 3 Multiple Vitamins-Minerals (MULTIVITAMINS THER. W/MINERALS) TABS, Take 1 tablet by mouth daily.  , Disp: , Rfl: ;  potassium chloride SA (K-DUR,KLOR-CON) 20 MEQ tablet, Take 20 mEq by mouth 3 (three) times daily.  , Disp: , Rfl: ;  tolterodine (DETROL LA) 2 MG 24 hr capsule, Take 1 capsule (2 mg total) by mouth daily., Disp: 30 capsule, Rfl: 3 traMADol (ULTRAM) 50 MG tablet, Take 1 tablet (50 mg total) by mouth every 6 (six) hours as needed. Maximum dose= 8 tablets per day. For pain, Disp: 30 tablet, Rfl: 3  Signed: Girard Koontz 11/03/2011, 3:16 PM

## 2011-11-07 DIAGNOSIS — N390 Urinary tract infection, site not specified: Secondary | ICD-10-CM | POA: Diagnosis not present

## 2011-11-07 DIAGNOSIS — G35 Multiple sclerosis: Secondary | ICD-10-CM | POA: Diagnosis not present

## 2011-11-07 DIAGNOSIS — R269 Unspecified abnormalities of gait and mobility: Secondary | ICD-10-CM | POA: Diagnosis not present

## 2011-11-07 DIAGNOSIS — L89309 Pressure ulcer of unspecified buttock, unspecified stage: Secondary | ICD-10-CM | POA: Diagnosis not present

## 2011-11-07 DIAGNOSIS — G939 Disorder of brain, unspecified: Secondary | ICD-10-CM | POA: Diagnosis not present

## 2011-11-07 DIAGNOSIS — L8993 Pressure ulcer of unspecified site, stage 3: Secondary | ICD-10-CM | POA: Diagnosis not present

## 2011-12-08 ENCOUNTER — Telehealth: Payer: Self-pay | Admitting: Family Medicine

## 2011-12-08 NOTE — Telephone Encounter (Signed)
Fax request for refill of Alprazolam was declined. Dr Shaune Pollack was her continuity physician before the nursing home rehab stay

## 2011-12-15 DIAGNOSIS — F329 Major depressive disorder, single episode, unspecified: Secondary | ICD-10-CM | POA: Diagnosis not present

## 2011-12-15 DIAGNOSIS — I10 Essential (primary) hypertension: Secondary | ICD-10-CM | POA: Diagnosis not present

## 2011-12-15 DIAGNOSIS — E46 Unspecified protein-calorie malnutrition: Secondary | ICD-10-CM | POA: Diagnosis not present

## 2011-12-15 DIAGNOSIS — G4700 Insomnia, unspecified: Secondary | ICD-10-CM | POA: Diagnosis not present

## 2011-12-15 DIAGNOSIS — G500 Trigeminal neuralgia: Secondary | ICD-10-CM | POA: Diagnosis not present

## 2011-12-15 DIAGNOSIS — L8990 Pressure ulcer of unspecified site, unspecified stage: Secondary | ICD-10-CM | POA: Diagnosis not present

## 2011-12-15 DIAGNOSIS — G35 Multiple sclerosis: Secondary | ICD-10-CM | POA: Diagnosis not present

## 2012-01-06 DIAGNOSIS — G35 Multiple sclerosis: Secondary | ICD-10-CM | POA: Diagnosis not present

## 2012-01-06 DIAGNOSIS — I10 Essential (primary) hypertension: Secondary | ICD-10-CM | POA: Diagnosis not present

## 2012-01-06 DIAGNOSIS — R269 Unspecified abnormalities of gait and mobility: Secondary | ICD-10-CM | POA: Diagnosis not present

## 2012-01-06 DIAGNOSIS — G939 Disorder of brain, unspecified: Secondary | ICD-10-CM | POA: Diagnosis not present

## 2012-01-10 ENCOUNTER — Emergency Department (HOSPITAL_COMMUNITY)
Admission: EM | Admit: 2012-01-10 | Discharge: 2012-01-11 | Disposition: A | Discharge status: Home / Self Care | Payer: Medicare Other | Attending: Emergency Medicine | Admitting: Emergency Medicine

## 2012-01-10 ENCOUNTER — Encounter (HOSPITAL_COMMUNITY): Payer: Self-pay | Admitting: *Deleted

## 2012-01-10 DIAGNOSIS — Z79899 Other long term (current) drug therapy: Secondary | ICD-10-CM | POA: Insufficient documentation

## 2012-01-10 DIAGNOSIS — E785 Hyperlipidemia, unspecified: Secondary | ICD-10-CM | POA: Insufficient documentation

## 2012-01-10 DIAGNOSIS — M7989 Other specified soft tissue disorders: Secondary | ICD-10-CM | POA: Diagnosis not present

## 2012-01-10 DIAGNOSIS — M79605 Pain in left leg: Secondary | ICD-10-CM

## 2012-01-10 DIAGNOSIS — M79609 Pain in unspecified limb: Secondary | ICD-10-CM | POA: Insufficient documentation

## 2012-01-10 DIAGNOSIS — I10 Essential (primary) hypertension: Secondary | ICD-10-CM | POA: Insufficient documentation

## 2012-01-10 DIAGNOSIS — G35 Multiple sclerosis: Secondary | ICD-10-CM | POA: Insufficient documentation

## 2012-01-10 MED ORDER — ENOXAPARIN SODIUM 80 MG/0.8ML ~~LOC~~ SOLN
70.0000 mg | Freq: Once | SUBCUTANEOUS | Status: AC
  Administered 2012-01-11: 70 mg via SUBCUTANEOUS
  Filled 2012-01-10: qty 0.8

## 2012-01-10 NOTE — Discharge Instructions (Signed)
You were given an injection of Lovenox tonight.   Return in AM for ultrasound of left leg to rule out blood clot.  Elevate leg

## 2012-01-10 NOTE — ED Notes (Signed)
Pt complains of swelling and intermittent pain to Left thigh and swelling to Lt calf for "several weeks". Per Pt's son pt has a steroid infusion several days prior to developing the swelling to treat her MS and her health care providers at first thought the swelling was related to that but are now concerned about possible DVT since the swelling has had no improvement.

## 2012-01-10 NOTE — ED Notes (Signed)
The pt has ms and  For the past 2 days her entire lt leg has been swollen and painful

## 2012-01-11 ENCOUNTER — Ambulatory Visit (HOSPITAL_COMMUNITY)
Admission: RE | Admit: 2012-01-11 | Discharge: 2012-01-11 | Disposition: A | Discharge status: Home / Self Care | Payer: Medicare Other | Source: Ambulatory Visit | Attending: Family Medicine | Admitting: Family Medicine

## 2012-01-11 DIAGNOSIS — R269 Unspecified abnormalities of gait and mobility: Secondary | ICD-10-CM | POA: Diagnosis not present

## 2012-01-11 DIAGNOSIS — M7989 Other specified soft tissue disorders: Secondary | ICD-10-CM | POA: Diagnosis not present

## 2012-01-11 DIAGNOSIS — M79609 Pain in unspecified limb: Secondary | ICD-10-CM | POA: Diagnosis not present

## 2012-01-11 DIAGNOSIS — Z79899 Other long term (current) drug therapy: Secondary | ICD-10-CM | POA: Diagnosis not present

## 2012-01-11 DIAGNOSIS — R52 Pain, unspecified: Secondary | ICD-10-CM

## 2012-01-11 DIAGNOSIS — I82409 Acute embolism and thrombosis of unspecified deep veins of unspecified lower extremity: Secondary | ICD-10-CM | POA: Diagnosis not present

## 2012-01-11 DIAGNOSIS — G939 Disorder of brain, unspecified: Secondary | ICD-10-CM | POA: Diagnosis not present

## 2012-01-11 DIAGNOSIS — E785 Hyperlipidemia, unspecified: Secondary | ICD-10-CM | POA: Diagnosis not present

## 2012-01-11 DIAGNOSIS — G35 Multiple sclerosis: Secondary | ICD-10-CM | POA: Diagnosis not present

## 2012-01-11 DIAGNOSIS — G35D Multiple sclerosis, unspecified: Secondary | ICD-10-CM | POA: Diagnosis not present

## 2012-01-11 DIAGNOSIS — I10 Essential (primary) hypertension: Secondary | ICD-10-CM | POA: Diagnosis not present

## 2012-01-11 NOTE — Progress Notes (Signed)
VASCULAR LAB PRELIMINARY  PRELIMINARY  PRELIMINARY  PRELIMINARY  Left lower extremity venous duplex completed.    Preliminary report:  Left: DVT noted coursing from the distal popliteal through the common femoral vein.  Superficial thrombosis noted in the greater saphenous vein from the mid thigh to the confluence with the common femoral vein.  No Baker's cyst.  Jalexia Lalli D, RVS 01/11/2012, 10:18 AM

## 2012-01-11 NOTE — ED Notes (Signed)
Pt discharged home in wheelchair with son. Stressed to pt and son the importance of coming back this morning between 0800 and 0830 for vascular study. Pt and son verbalized understanding of this instruction. All questions where answered, no further questions at this time.

## 2012-01-11 NOTE — ED Provider Notes (Signed)
History     CSN: [id number]  Arrival date & time 01/10/12  2210   First MD Initiated Contact with Patient 01/10/12 2315      Chief Complaint  Patient presents with  . Leg Pain    (Consider location/radiation/quality/duration/timing/severity/associated sxs/prior treatment) HPI...swelling and tenderness in left leg for 2 days. No trauma. Patient has MS and is essentially immobilized. Palpation makes symptoms worse.  Symptoms are minimal to moderate. No radiation.  Past Medical History  Diagnosis Date  . Multiple sclerosis     Right sided weakness and gait disorder, bladder and bowel problems   . Hypertension   . Depression   . 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 Trigeminal neuralgia   . Dyslipidemia   . Fibroids   . Overdose of muscle relaxant     Unintentional baclofen overdose     Past Surgical History  Procedure Date  . Strabismus surgery     Right eye     Family History  Problem Relation Age of Onset  . Hypertension    . Heart disease    . Uterine cancer      History  Substance Use Topics  . Smoking status: Never Smoker   . Smokeless tobacco: Not on file  . Alcohol Use: No    OB History    Grav Para Term Preterm Abortions TAB SAB Ect Mult Living                  Review of Systems  All other systems reviewed and are negative.    Allergies  Shellfish allergy  Home Medications   Current Outpatient Rx  Name Route Sig Dispense Refill  . ALPRAZOLAM 0.25 MG PO TABS Oral Take 0.25 mg by mouth at bedtime as needed. For anxiety    . AMLODIPINE BESYLATE 10 MG PO TABS Oral Take 1 tablet (10 mg total) by mouth daily. 30 tablet 3  . ATORVASTATIN CALCIUM 40 MG PO TABS Oral Take 40 mg by mouth daily.      Marland Kitchen BACLOFEN 10 MG PO TABS Oral Take 10 mg by mouth 3 (three) times daily.    . BUPROPION HCL ER (XL) 300 MG PO TB24 Oral Take 300 mg by mouth daily.    Marland Kitchen CARBAMAZEPINE 200 MG PO TABS Oral Take 200 mg by mouth 3 (three) times daily.    . DONEPEZIL HCL  5 MG PO TABS Oral Take 5 mg by mouth daily.    . OMEGA-3 FATTY ACIDS 1000 MG PO CAPS Oral Take 2 g by mouth 3 (three) times daily.    . INTERFERON BETA-1A 30 MCG/0.5ML IM KIT Intramuscular Inject 30 mcg into the muscle every 7 (seven) days.    Marland Kitchen LABETALOL HCL 100 MG PO TABS Oral Take 100 mg by mouth 2 (two) times daily.    Marland Kitchen LOSARTAN POTASSIUM-HCTZ 50-12.5 MG PO TABS Oral Take 1 tablet by mouth daily.    Carma Leaven M PLUS PO TABS Oral Take 1 tablet by mouth daily.      Marland Kitchen POTASSIUM CHLORIDE CRYS ER 20 MEQ PO TBCR Oral Take 20 mEq by mouth 3 (three) times daily.      . TOLTERODINE TARTRATE ER 2 MG PO CP24 Oral Take 2 mg by mouth daily.    . TRAMADOL HCL 50 MG PO TABS Oral Take 50 mg by mouth every 6 (six) hours as needed. Maximum dose= 8 tablets per day. For pain  BP 114/46  Pulse 88  Temp(Src) 98.1 F (36.7 C) (Oral)  Resp 16  SpO2 100%  Physical Exam  Nursing note and vitals reviewed. Constitutional: She is oriented to person, place, and time. She appears well-developed and well-nourished.  HENT:  Head: Normocephalic and atraumatic.  Eyes: Conjunctivae and EOM are normal. Pupils are equal, round, and reactive to light.  Neck: Normal range of motion. Neck supple.  Cardiovascular: Normal rate and regular rhythm.   Pulmonary/Chest: Effort normal and breath sounds normal.  Abdominal: Soft. Bowel sounds are normal.  Musculoskeletal:       Left lower extremity: Some tenderness in posterior thigh and calf. Neurovascular intact.  Neurological: She is alert and oriented to person, place, and time.  Skin: Skin is warm and dry.  Psychiatric: She has a normal mood and affect.    ED Course  Procedures (including critical care time)  Labs Reviewed - No data to display No results found.   1. Left leg pain       MDM  Per DVT protocol, will give subcutaneous Lovenox in ED. Return in morning for Doppler study of left leg.  No chest pain, shortness of breath, tachypnea, hypoxemia to  suggest pulmonary embolus        Donnetta Hutching, MD 01/23/12 [id number]

## 2012-01-13 DIAGNOSIS — G939 Disorder of brain, unspecified: Secondary | ICD-10-CM | POA: Diagnosis not present

## 2012-01-13 DIAGNOSIS — G35 Multiple sclerosis: Secondary | ICD-10-CM | POA: Diagnosis not present

## 2012-01-13 DIAGNOSIS — I10 Essential (primary) hypertension: Secondary | ICD-10-CM | POA: Diagnosis not present

## 2012-01-13 DIAGNOSIS — R269 Unspecified abnormalities of gait and mobility: Secondary | ICD-10-CM | POA: Diagnosis not present

## 2012-01-16 DIAGNOSIS — G35 Multiple sclerosis: Secondary | ICD-10-CM | POA: Diagnosis not present

## 2012-01-16 DIAGNOSIS — R269 Unspecified abnormalities of gait and mobility: Secondary | ICD-10-CM | POA: Diagnosis not present

## 2012-01-16 DIAGNOSIS — G939 Disorder of brain, unspecified: Secondary | ICD-10-CM | POA: Diagnosis not present

## 2012-01-16 DIAGNOSIS — I10 Essential (primary) hypertension: Secondary | ICD-10-CM | POA: Diagnosis not present

## 2012-01-18 ENCOUNTER — Ambulatory Visit: Payer: Medicare Other | Admitting: Physical Therapy

## 2012-01-18 ENCOUNTER — Ambulatory Visit: Payer: Medicare Other | Attending: Neurology | Admitting: Occupational Therapy

## 2012-01-18 DIAGNOSIS — R269 Unspecified abnormalities of gait and mobility: Secondary | ICD-10-CM | POA: Diagnosis not present

## 2012-01-18 DIAGNOSIS — I10 Essential (primary) hypertension: Secondary | ICD-10-CM | POA: Diagnosis not present

## 2012-01-18 DIAGNOSIS — R5381 Other malaise: Secondary | ICD-10-CM | POA: Diagnosis not present

## 2012-01-18 DIAGNOSIS — G939 Disorder of brain, unspecified: Secondary | ICD-10-CM | POA: Diagnosis not present

## 2012-01-18 DIAGNOSIS — IMO0001 Reserved for inherently not codable concepts without codable children: Secondary | ICD-10-CM | POA: Insufficient documentation

## 2012-01-18 DIAGNOSIS — M6289 Other specified disorders of muscle: Secondary | ICD-10-CM | POA: Diagnosis not present

## 2012-01-18 DIAGNOSIS — G35 Multiple sclerosis: Secondary | ICD-10-CM | POA: Diagnosis not present

## 2012-01-18 DIAGNOSIS — Z7901 Long term (current) use of anticoagulants: Secondary | ICD-10-CM | POA: Diagnosis not present

## 2012-01-19 DIAGNOSIS — G939 Disorder of brain, unspecified: Secondary | ICD-10-CM | POA: Diagnosis not present

## 2012-01-19 DIAGNOSIS — I10 Essential (primary) hypertension: Secondary | ICD-10-CM | POA: Diagnosis not present

## 2012-01-19 DIAGNOSIS — G35 Multiple sclerosis: Secondary | ICD-10-CM | POA: Diagnosis not present

## 2012-01-19 DIAGNOSIS — R269 Unspecified abnormalities of gait and mobility: Secondary | ICD-10-CM | POA: Diagnosis not present

## 2012-01-19 DIAGNOSIS — Z7901 Long term (current) use of anticoagulants: Secondary | ICD-10-CM | POA: Diagnosis not present

## 2012-01-20 ENCOUNTER — Ambulatory Visit: Payer: Medicare Other | Admitting: Occupational Therapy

## 2012-01-20 DIAGNOSIS — G35 Multiple sclerosis: Secondary | ICD-10-CM | POA: Diagnosis not present

## 2012-01-20 DIAGNOSIS — R5381 Other malaise: Secondary | ICD-10-CM | POA: Diagnosis not present

## 2012-01-20 DIAGNOSIS — M6289 Other specified disorders of muscle: Secondary | ICD-10-CM | POA: Diagnosis not present

## 2012-01-20 DIAGNOSIS — I10 Essential (primary) hypertension: Secondary | ICD-10-CM | POA: Diagnosis not present

## 2012-01-20 DIAGNOSIS — IMO0001 Reserved for inherently not codable concepts without codable children: Secondary | ICD-10-CM | POA: Diagnosis not present

## 2012-01-20 DIAGNOSIS — R269 Unspecified abnormalities of gait and mobility: Secondary | ICD-10-CM | POA: Diagnosis not present

## 2012-01-20 DIAGNOSIS — G939 Disorder of brain, unspecified: Secondary | ICD-10-CM | POA: Diagnosis not present

## 2012-01-23 DIAGNOSIS — R269 Unspecified abnormalities of gait and mobility: Secondary | ICD-10-CM | POA: Diagnosis not present

## 2012-01-23 DIAGNOSIS — G35 Multiple sclerosis: Secondary | ICD-10-CM | POA: Diagnosis not present

## 2012-01-23 DIAGNOSIS — G939 Disorder of brain, unspecified: Secondary | ICD-10-CM | POA: Diagnosis not present

## 2012-01-23 DIAGNOSIS — I10 Essential (primary) hypertension: Secondary | ICD-10-CM | POA: Diagnosis not present

## 2012-01-24 DIAGNOSIS — G35 Multiple sclerosis: Secondary | ICD-10-CM | POA: Diagnosis not present

## 2012-01-24 DIAGNOSIS — F079 Unspecified personality and behavioral disorder due to known physiological condition: Secondary | ICD-10-CM | POA: Diagnosis not present

## 2012-01-24 DIAGNOSIS — R269 Unspecified abnormalities of gait and mobility: Secondary | ICD-10-CM | POA: Diagnosis not present

## 2012-01-24 DIAGNOSIS — G500 Trigeminal neuralgia: Secondary | ICD-10-CM | POA: Diagnosis not present

## 2012-01-25 ENCOUNTER — Ambulatory Visit: Payer: Medicare Other | Admitting: Occupational Therapy

## 2012-01-25 ENCOUNTER — Ambulatory Visit: Payer: Medicare Other | Admitting: Physical Therapy

## 2012-01-25 ENCOUNTER — Encounter: Payer: Medicare Other | Admitting: Occupational Therapy

## 2012-01-25 DIAGNOSIS — IMO0001 Reserved for inherently not codable concepts without codable children: Secondary | ICD-10-CM | POA: Diagnosis not present

## 2012-01-25 DIAGNOSIS — R269 Unspecified abnormalities of gait and mobility: Secondary | ICD-10-CM | POA: Diagnosis not present

## 2012-01-25 DIAGNOSIS — M6289 Other specified disorders of muscle: Secondary | ICD-10-CM | POA: Diagnosis not present

## 2012-01-25 DIAGNOSIS — G35 Multiple sclerosis: Secondary | ICD-10-CM | POA: Diagnosis not present

## 2012-01-25 DIAGNOSIS — I10 Essential (primary) hypertension: Secondary | ICD-10-CM | POA: Diagnosis not present

## 2012-01-25 DIAGNOSIS — Z7901 Long term (current) use of anticoagulants: Secondary | ICD-10-CM | POA: Diagnosis not present

## 2012-01-25 DIAGNOSIS — R5381 Other malaise: Secondary | ICD-10-CM | POA: Diagnosis not present

## 2012-01-25 DIAGNOSIS — G939 Disorder of brain, unspecified: Secondary | ICD-10-CM | POA: Diagnosis not present

## 2012-01-26 ENCOUNTER — Ambulatory Visit: Payer: Medicare Other | Admitting: Physical Therapy

## 2012-01-26 ENCOUNTER — Ambulatory Visit: Payer: Medicare Other | Admitting: Occupational Therapy

## 2012-01-26 DIAGNOSIS — IMO0001 Reserved for inherently not codable concepts without codable children: Secondary | ICD-10-CM | POA: Diagnosis not present

## 2012-01-26 DIAGNOSIS — M6289 Other specified disorders of muscle: Secondary | ICD-10-CM | POA: Diagnosis not present

## 2012-01-26 DIAGNOSIS — R5381 Other malaise: Secondary | ICD-10-CM | POA: Diagnosis not present

## 2012-01-27 DIAGNOSIS — R269 Unspecified abnormalities of gait and mobility: Secondary | ICD-10-CM | POA: Diagnosis not present

## 2012-01-27 DIAGNOSIS — G939 Disorder of brain, unspecified: Secondary | ICD-10-CM | POA: Diagnosis not present

## 2012-01-27 DIAGNOSIS — G35 Multiple sclerosis: Secondary | ICD-10-CM | POA: Diagnosis not present

## 2012-01-27 DIAGNOSIS — I10 Essential (primary) hypertension: Secondary | ICD-10-CM | POA: Diagnosis not present

## 2012-01-31 ENCOUNTER — Ambulatory Visit: Payer: Medicare Other | Admitting: Physical Therapy

## 2012-01-31 ENCOUNTER — Ambulatory Visit: Payer: Medicare Other | Admitting: Occupational Therapy

## 2012-01-31 DIAGNOSIS — R5381 Other malaise: Secondary | ICD-10-CM | POA: Diagnosis not present

## 2012-01-31 DIAGNOSIS — IMO0001 Reserved for inherently not codable concepts without codable children: Secondary | ICD-10-CM | POA: Diagnosis not present

## 2012-01-31 DIAGNOSIS — M6289 Other specified disorders of muscle: Secondary | ICD-10-CM | POA: Diagnosis not present

## 2012-02-01 DIAGNOSIS — G939 Disorder of brain, unspecified: Secondary | ICD-10-CM | POA: Diagnosis not present

## 2012-02-01 DIAGNOSIS — R269 Unspecified abnormalities of gait and mobility: Secondary | ICD-10-CM | POA: Diagnosis not present

## 2012-02-01 DIAGNOSIS — I10 Essential (primary) hypertension: Secondary | ICD-10-CM | POA: Diagnosis not present

## 2012-02-01 DIAGNOSIS — G35 Multiple sclerosis: Secondary | ICD-10-CM | POA: Diagnosis not present

## 2012-02-01 DIAGNOSIS — Z7901 Long term (current) use of anticoagulants: Secondary | ICD-10-CM | POA: Diagnosis not present

## 2012-02-02 ENCOUNTER — Encounter: Payer: Medicare Other | Admitting: Occupational Therapy

## 2012-02-02 ENCOUNTER — Ambulatory Visit: Payer: Medicare Other | Admitting: Physical Therapy

## 2012-02-06 DIAGNOSIS — H2510 Age-related nuclear cataract, unspecified eye: Secondary | ICD-10-CM | POA: Diagnosis not present

## 2012-02-07 ENCOUNTER — Ambulatory Visit: Payer: Medicare Other | Admitting: Physical Therapy

## 2012-02-07 ENCOUNTER — Ambulatory Visit: Payer: Medicare Other | Admitting: Occupational Therapy

## 2012-02-07 DIAGNOSIS — M6289 Other specified disorders of muscle: Secondary | ICD-10-CM | POA: Diagnosis not present

## 2012-02-07 DIAGNOSIS — IMO0001 Reserved for inherently not codable concepts without codable children: Secondary | ICD-10-CM | POA: Diagnosis not present

## 2012-02-07 DIAGNOSIS — R5381 Other malaise: Secondary | ICD-10-CM | POA: Diagnosis not present

## 2012-02-08 DIAGNOSIS — R269 Unspecified abnormalities of gait and mobility: Secondary | ICD-10-CM | POA: Diagnosis not present

## 2012-02-08 DIAGNOSIS — G939 Disorder of brain, unspecified: Secondary | ICD-10-CM | POA: Diagnosis not present

## 2012-02-08 DIAGNOSIS — G35 Multiple sclerosis: Secondary | ICD-10-CM | POA: Diagnosis not present

## 2012-02-08 DIAGNOSIS — I82409 Acute embolism and thrombosis of unspecified deep veins of unspecified lower extremity: Secondary | ICD-10-CM | POA: Diagnosis not present

## 2012-02-08 DIAGNOSIS — I10 Essential (primary) hypertension: Secondary | ICD-10-CM | POA: Diagnosis not present

## 2012-02-08 DIAGNOSIS — Z7901 Long term (current) use of anticoagulants: Secondary | ICD-10-CM | POA: Diagnosis not present

## 2012-02-10 ENCOUNTER — Ambulatory Visit: Payer: Medicare Other | Attending: Neurology | Admitting: Physical Therapy

## 2012-02-10 ENCOUNTER — Ambulatory Visit: Payer: Medicare Other | Admitting: Occupational Therapy

## 2012-02-10 DIAGNOSIS — R5381 Other malaise: Secondary | ICD-10-CM | POA: Diagnosis not present

## 2012-02-10 DIAGNOSIS — M6289 Other specified disorders of muscle: Secondary | ICD-10-CM | POA: Diagnosis not present

## 2012-02-10 DIAGNOSIS — Z5189 Encounter for other specified aftercare: Secondary | ICD-10-CM | POA: Diagnosis not present

## 2012-02-14 ENCOUNTER — Ambulatory Visit: Payer: Medicare Other | Admitting: Occupational Therapy

## 2012-02-14 ENCOUNTER — Ambulatory Visit: Payer: Medicare Other | Admitting: Physical Therapy

## 2012-02-14 DIAGNOSIS — Z5189 Encounter for other specified aftercare: Secondary | ICD-10-CM | POA: Diagnosis not present

## 2012-02-14 DIAGNOSIS — R5381 Other malaise: Secondary | ICD-10-CM | POA: Diagnosis not present

## 2012-02-14 DIAGNOSIS — M6289 Other specified disorders of muscle: Secondary | ICD-10-CM | POA: Diagnosis not present

## 2012-02-15 DIAGNOSIS — G35 Multiple sclerosis: Secondary | ICD-10-CM | POA: Diagnosis not present

## 2012-02-15 DIAGNOSIS — G939 Disorder of brain, unspecified: Secondary | ICD-10-CM | POA: Diagnosis not present

## 2012-02-15 DIAGNOSIS — I10 Essential (primary) hypertension: Secondary | ICD-10-CM | POA: Diagnosis not present

## 2012-02-15 DIAGNOSIS — R269 Unspecified abnormalities of gait and mobility: Secondary | ICD-10-CM | POA: Diagnosis not present

## 2012-02-17 ENCOUNTER — Ambulatory Visit: Payer: Medicare Other | Admitting: Occupational Therapy

## 2012-02-17 ENCOUNTER — Ambulatory Visit: Payer: Medicare Other | Admitting: Physical Therapy

## 2012-02-17 DIAGNOSIS — R5381 Other malaise: Secondary | ICD-10-CM | POA: Diagnosis not present

## 2012-02-17 DIAGNOSIS — M6289 Other specified disorders of muscle: Secondary | ICD-10-CM | POA: Diagnosis not present

## 2012-02-17 DIAGNOSIS — Z5189 Encounter for other specified aftercare: Secondary | ICD-10-CM | POA: Diagnosis not present

## 2012-02-22 DIAGNOSIS — I10 Essential (primary) hypertension: Secondary | ICD-10-CM | POA: Diagnosis not present

## 2012-02-22 DIAGNOSIS — R269 Unspecified abnormalities of gait and mobility: Secondary | ICD-10-CM | POA: Diagnosis not present

## 2012-02-22 DIAGNOSIS — G939 Disorder of brain, unspecified: Secondary | ICD-10-CM | POA: Diagnosis not present

## 2012-02-22 DIAGNOSIS — G35 Multiple sclerosis: Secondary | ICD-10-CM | POA: Diagnosis not present

## 2012-02-27 DIAGNOSIS — R269 Unspecified abnormalities of gait and mobility: Secondary | ICD-10-CM | POA: Diagnosis not present

## 2012-02-27 DIAGNOSIS — I10 Essential (primary) hypertension: Secondary | ICD-10-CM | POA: Diagnosis not present

## 2012-02-27 DIAGNOSIS — G939 Disorder of brain, unspecified: Secondary | ICD-10-CM | POA: Diagnosis not present

## 2012-02-27 DIAGNOSIS — G35 Multiple sclerosis: Secondary | ICD-10-CM | POA: Diagnosis not present

## 2012-02-28 ENCOUNTER — Ambulatory Visit: Payer: Medicare Other | Admitting: Physical Therapy

## 2012-02-28 DIAGNOSIS — M6289 Other specified disorders of muscle: Secondary | ICD-10-CM | POA: Diagnosis not present

## 2012-02-28 DIAGNOSIS — R5381 Other malaise: Secondary | ICD-10-CM | POA: Diagnosis not present

## 2012-02-28 DIAGNOSIS — Z5189 Encounter for other specified aftercare: Secondary | ICD-10-CM | POA: Diagnosis not present

## 2012-02-29 ENCOUNTER — Ambulatory Visit: Payer: Medicare Other | Admitting: Occupational Therapy

## 2012-02-29 DIAGNOSIS — G35 Multiple sclerosis: Secondary | ICD-10-CM | POA: Diagnosis not present

## 2012-02-29 DIAGNOSIS — M6289 Other specified disorders of muscle: Secondary | ICD-10-CM | POA: Diagnosis not present

## 2012-02-29 DIAGNOSIS — R269 Unspecified abnormalities of gait and mobility: Secondary | ICD-10-CM | POA: Diagnosis not present

## 2012-02-29 DIAGNOSIS — R5381 Other malaise: Secondary | ICD-10-CM | POA: Diagnosis not present

## 2012-02-29 DIAGNOSIS — I10 Essential (primary) hypertension: Secondary | ICD-10-CM | POA: Diagnosis not present

## 2012-02-29 DIAGNOSIS — Z5189 Encounter for other specified aftercare: Secondary | ICD-10-CM | POA: Diagnosis not present

## 2012-02-29 DIAGNOSIS — G939 Disorder of brain, unspecified: Secondary | ICD-10-CM | POA: Diagnosis not present

## 2012-03-06 DIAGNOSIS — R32 Unspecified urinary incontinence: Secondary | ICD-10-CM | POA: Diagnosis not present

## 2012-03-06 DIAGNOSIS — I10 Essential (primary) hypertension: Secondary | ICD-10-CM | POA: Diagnosis not present

## 2012-03-06 DIAGNOSIS — G939 Disorder of brain, unspecified: Secondary | ICD-10-CM | POA: Diagnosis not present

## 2012-03-06 DIAGNOSIS — G35 Multiple sclerosis: Secondary | ICD-10-CM | POA: Diagnosis not present

## 2012-03-06 DIAGNOSIS — R269 Unspecified abnormalities of gait and mobility: Secondary | ICD-10-CM | POA: Diagnosis not present

## 2012-05-05 DIAGNOSIS — I10 Essential (primary) hypertension: Secondary | ICD-10-CM | POA: Diagnosis not present

## 2012-05-05 DIAGNOSIS — R32 Unspecified urinary incontinence: Secondary | ICD-10-CM | POA: Diagnosis not present

## 2012-05-05 DIAGNOSIS — G939 Disorder of brain, unspecified: Secondary | ICD-10-CM | POA: Diagnosis not present

## 2012-05-05 DIAGNOSIS — G35 Multiple sclerosis: Secondary | ICD-10-CM | POA: Diagnosis not present

## 2012-05-05 DIAGNOSIS — R269 Unspecified abnormalities of gait and mobility: Secondary | ICD-10-CM | POA: Diagnosis not present

## 2012-05-10 DIAGNOSIS — R32 Unspecified urinary incontinence: Secondary | ICD-10-CM | POA: Diagnosis not present

## 2012-05-10 DIAGNOSIS — R269 Unspecified abnormalities of gait and mobility: Secondary | ICD-10-CM | POA: Diagnosis not present

## 2012-05-10 DIAGNOSIS — G35 Multiple sclerosis: Secondary | ICD-10-CM | POA: Diagnosis not present

## 2012-05-10 DIAGNOSIS — I10 Essential (primary) hypertension: Secondary | ICD-10-CM | POA: Diagnosis not present

## 2012-05-10 DIAGNOSIS — G939 Disorder of brain, unspecified: Secondary | ICD-10-CM | POA: Diagnosis not present

## 2012-05-16 DIAGNOSIS — R269 Unspecified abnormalities of gait and mobility: Secondary | ICD-10-CM | POA: Diagnosis not present

## 2012-05-16 DIAGNOSIS — R21 Rash and other nonspecific skin eruption: Secondary | ICD-10-CM | POA: Diagnosis not present

## 2012-05-16 DIAGNOSIS — R32 Unspecified urinary incontinence: Secondary | ICD-10-CM | POA: Diagnosis not present

## 2012-05-16 DIAGNOSIS — G35 Multiple sclerosis: Secondary | ICD-10-CM | POA: Diagnosis not present

## 2012-05-16 DIAGNOSIS — I10 Essential (primary) hypertension: Secondary | ICD-10-CM | POA: Diagnosis not present

## 2012-05-16 DIAGNOSIS — G939 Disorder of brain, unspecified: Secondary | ICD-10-CM | POA: Diagnosis not present

## 2012-05-16 DIAGNOSIS — I82409 Acute embolism and thrombosis of unspecified deep veins of unspecified lower extremity: Secondary | ICD-10-CM | POA: Diagnosis not present

## 2012-05-23 DIAGNOSIS — I10 Essential (primary) hypertension: Secondary | ICD-10-CM | POA: Diagnosis not present

## 2012-05-23 DIAGNOSIS — R269 Unspecified abnormalities of gait and mobility: Secondary | ICD-10-CM | POA: Diagnosis not present

## 2012-05-23 DIAGNOSIS — G35 Multiple sclerosis: Secondary | ICD-10-CM | POA: Diagnosis not present

## 2012-05-23 DIAGNOSIS — G500 Trigeminal neuralgia: Secondary | ICD-10-CM | POA: Diagnosis not present

## 2012-05-23 DIAGNOSIS — R32 Unspecified urinary incontinence: Secondary | ICD-10-CM | POA: Diagnosis not present

## 2012-05-23 DIAGNOSIS — F079 Unspecified personality and behavioral disorder due to known physiological condition: Secondary | ICD-10-CM | POA: Diagnosis not present

## 2012-05-23 DIAGNOSIS — G939 Disorder of brain, unspecified: Secondary | ICD-10-CM | POA: Diagnosis not present

## 2012-05-24 DIAGNOSIS — Z5181 Encounter for therapeutic drug level monitoring: Secondary | ICD-10-CM | POA: Diagnosis not present

## 2012-05-30 DIAGNOSIS — G939 Disorder of brain, unspecified: Secondary | ICD-10-CM | POA: Diagnosis not present

## 2012-05-30 DIAGNOSIS — R32 Unspecified urinary incontinence: Secondary | ICD-10-CM | POA: Diagnosis not present

## 2012-05-30 DIAGNOSIS — I10 Essential (primary) hypertension: Secondary | ICD-10-CM | POA: Diagnosis not present

## 2012-05-30 DIAGNOSIS — G35 Multiple sclerosis: Secondary | ICD-10-CM | POA: Diagnosis not present

## 2012-05-30 DIAGNOSIS — R269 Unspecified abnormalities of gait and mobility: Secondary | ICD-10-CM | POA: Diagnosis not present

## 2012-06-06 DIAGNOSIS — I10 Essential (primary) hypertension: Secondary | ICD-10-CM | POA: Diagnosis not present

## 2012-06-06 DIAGNOSIS — R32 Unspecified urinary incontinence: Secondary | ICD-10-CM | POA: Diagnosis not present

## 2012-06-06 DIAGNOSIS — R269 Unspecified abnormalities of gait and mobility: Secondary | ICD-10-CM | POA: Diagnosis not present

## 2012-06-06 DIAGNOSIS — G939 Disorder of brain, unspecified: Secondary | ICD-10-CM | POA: Diagnosis not present

## 2012-06-06 DIAGNOSIS — G35 Multiple sclerosis: Secondary | ICD-10-CM | POA: Diagnosis not present

## 2012-06-13 DIAGNOSIS — I10 Essential (primary) hypertension: Secondary | ICD-10-CM | POA: Diagnosis not present

## 2012-06-13 DIAGNOSIS — G939 Disorder of brain, unspecified: Secondary | ICD-10-CM | POA: Diagnosis not present

## 2012-06-13 DIAGNOSIS — R269 Unspecified abnormalities of gait and mobility: Secondary | ICD-10-CM | POA: Diagnosis not present

## 2012-06-13 DIAGNOSIS — G35 Multiple sclerosis: Secondary | ICD-10-CM | POA: Diagnosis not present

## 2012-06-13 DIAGNOSIS — R32 Unspecified urinary incontinence: Secondary | ICD-10-CM | POA: Diagnosis not present

## 2012-06-20 DIAGNOSIS — R269 Unspecified abnormalities of gait and mobility: Secondary | ICD-10-CM | POA: Diagnosis not present

## 2012-06-20 DIAGNOSIS — R32 Unspecified urinary incontinence: Secondary | ICD-10-CM | POA: Diagnosis not present

## 2012-06-20 DIAGNOSIS — I10 Essential (primary) hypertension: Secondary | ICD-10-CM | POA: Diagnosis not present

## 2012-06-20 DIAGNOSIS — G35 Multiple sclerosis: Secondary | ICD-10-CM | POA: Diagnosis not present

## 2012-06-20 DIAGNOSIS — G939 Disorder of brain, unspecified: Secondary | ICD-10-CM | POA: Diagnosis not present

## 2012-06-27 DIAGNOSIS — G939 Disorder of brain, unspecified: Secondary | ICD-10-CM | POA: Diagnosis not present

## 2012-06-27 DIAGNOSIS — R269 Unspecified abnormalities of gait and mobility: Secondary | ICD-10-CM | POA: Diagnosis not present

## 2012-06-27 DIAGNOSIS — I10 Essential (primary) hypertension: Secondary | ICD-10-CM | POA: Diagnosis not present

## 2012-06-27 DIAGNOSIS — R32 Unspecified urinary incontinence: Secondary | ICD-10-CM | POA: Diagnosis not present

## 2012-06-27 DIAGNOSIS — G35 Multiple sclerosis: Secondary | ICD-10-CM | POA: Diagnosis not present

## 2012-07-04 DIAGNOSIS — Z7901 Long term (current) use of anticoagulants: Secondary | ICD-10-CM | POA: Diagnosis not present

## 2012-07-04 DIAGNOSIS — E7800 Pure hypercholesterolemia, unspecified: Secondary | ICD-10-CM | POA: Diagnosis not present

## 2012-07-04 DIAGNOSIS — G35 Multiple sclerosis: Secondary | ICD-10-CM | POA: Diagnosis not present

## 2012-07-04 DIAGNOSIS — I10 Essential (primary) hypertension: Secondary | ICD-10-CM | POA: Diagnosis not present

## 2012-07-04 DIAGNOSIS — Z79899 Other long term (current) drug therapy: Secondary | ICD-10-CM | POA: Diagnosis not present

## 2012-07-04 DIAGNOSIS — I82409 Acute embolism and thrombosis of unspecified deep veins of unspecified lower extremity: Secondary | ICD-10-CM | POA: Diagnosis not present

## 2012-07-04 DIAGNOSIS — G939 Disorder of brain, unspecified: Secondary | ICD-10-CM | POA: Diagnosis not present

## 2012-07-04 DIAGNOSIS — Z23 Encounter for immunization: Secondary | ICD-10-CM | POA: Diagnosis not present

## 2012-07-04 DIAGNOSIS — R269 Unspecified abnormalities of gait and mobility: Secondary | ICD-10-CM | POA: Diagnosis not present

## 2012-07-04 DIAGNOSIS — R32 Unspecified urinary incontinence: Secondary | ICD-10-CM | POA: Diagnosis not present

## 2012-07-11 DIAGNOSIS — G939 Disorder of brain, unspecified: Secondary | ICD-10-CM | POA: Diagnosis not present

## 2012-07-11 DIAGNOSIS — R32 Unspecified urinary incontinence: Secondary | ICD-10-CM | POA: Diagnosis not present

## 2012-07-11 DIAGNOSIS — R269 Unspecified abnormalities of gait and mobility: Secondary | ICD-10-CM | POA: Diagnosis not present

## 2012-07-11 DIAGNOSIS — G35 Multiple sclerosis: Secondary | ICD-10-CM | POA: Diagnosis not present

## 2012-07-11 DIAGNOSIS — I10 Essential (primary) hypertension: Secondary | ICD-10-CM | POA: Diagnosis not present

## 2012-07-18 DIAGNOSIS — G35 Multiple sclerosis: Secondary | ICD-10-CM | POA: Diagnosis not present

## 2012-07-18 DIAGNOSIS — R32 Unspecified urinary incontinence: Secondary | ICD-10-CM | POA: Diagnosis not present

## 2012-07-18 DIAGNOSIS — I10 Essential (primary) hypertension: Secondary | ICD-10-CM | POA: Diagnosis not present

## 2012-07-18 DIAGNOSIS — G939 Disorder of brain, unspecified: Secondary | ICD-10-CM | POA: Diagnosis not present

## 2012-07-18 DIAGNOSIS — R269 Unspecified abnormalities of gait and mobility: Secondary | ICD-10-CM | POA: Diagnosis not present

## 2012-07-25 DIAGNOSIS — R32 Unspecified urinary incontinence: Secondary | ICD-10-CM | POA: Diagnosis not present

## 2012-07-25 DIAGNOSIS — G939 Disorder of brain, unspecified: Secondary | ICD-10-CM | POA: Diagnosis not present

## 2012-07-25 DIAGNOSIS — R269 Unspecified abnormalities of gait and mobility: Secondary | ICD-10-CM | POA: Diagnosis not present

## 2012-07-25 DIAGNOSIS — I10 Essential (primary) hypertension: Secondary | ICD-10-CM | POA: Diagnosis not present

## 2012-07-25 DIAGNOSIS — G35 Multiple sclerosis: Secondary | ICD-10-CM | POA: Diagnosis not present

## 2012-08-01 DIAGNOSIS — G35 Multiple sclerosis: Secondary | ICD-10-CM | POA: Diagnosis not present

## 2012-08-01 DIAGNOSIS — I10 Essential (primary) hypertension: Secondary | ICD-10-CM | POA: Diagnosis not present

## 2012-08-01 DIAGNOSIS — G939 Disorder of brain, unspecified: Secondary | ICD-10-CM | POA: Diagnosis not present

## 2012-08-01 DIAGNOSIS — R269 Unspecified abnormalities of gait and mobility: Secondary | ICD-10-CM | POA: Diagnosis not present

## 2012-08-01 DIAGNOSIS — R32 Unspecified urinary incontinence: Secondary | ICD-10-CM | POA: Diagnosis not present

## 2012-08-08 DIAGNOSIS — G35 Multiple sclerosis: Secondary | ICD-10-CM | POA: Diagnosis not present

## 2012-08-08 DIAGNOSIS — G939 Disorder of brain, unspecified: Secondary | ICD-10-CM | POA: Diagnosis not present

## 2012-08-08 DIAGNOSIS — R32 Unspecified urinary incontinence: Secondary | ICD-10-CM | POA: Diagnosis not present

## 2012-08-08 DIAGNOSIS — R269 Unspecified abnormalities of gait and mobility: Secondary | ICD-10-CM | POA: Diagnosis not present

## 2012-08-08 DIAGNOSIS — I10 Essential (primary) hypertension: Secondary | ICD-10-CM | POA: Diagnosis not present

## 2012-08-15 DIAGNOSIS — G35 Multiple sclerosis: Secondary | ICD-10-CM | POA: Diagnosis not present

## 2012-08-15 DIAGNOSIS — I10 Essential (primary) hypertension: Secondary | ICD-10-CM | POA: Diagnosis not present

## 2012-08-15 DIAGNOSIS — R269 Unspecified abnormalities of gait and mobility: Secondary | ICD-10-CM | POA: Diagnosis not present

## 2012-08-15 DIAGNOSIS — G939 Disorder of brain, unspecified: Secondary | ICD-10-CM | POA: Diagnosis not present

## 2012-08-15 DIAGNOSIS — R32 Unspecified urinary incontinence: Secondary | ICD-10-CM | POA: Diagnosis not present

## 2012-08-22 DIAGNOSIS — R32 Unspecified urinary incontinence: Secondary | ICD-10-CM | POA: Diagnosis not present

## 2012-08-22 DIAGNOSIS — I10 Essential (primary) hypertension: Secondary | ICD-10-CM | POA: Diagnosis not present

## 2012-08-22 DIAGNOSIS — G35 Multiple sclerosis: Secondary | ICD-10-CM | POA: Diagnosis not present

## 2012-08-22 DIAGNOSIS — G939 Disorder of brain, unspecified: Secondary | ICD-10-CM | POA: Diagnosis not present

## 2012-08-22 DIAGNOSIS — R269 Unspecified abnormalities of gait and mobility: Secondary | ICD-10-CM | POA: Diagnosis not present

## 2012-08-29 DIAGNOSIS — I10 Essential (primary) hypertension: Secondary | ICD-10-CM | POA: Diagnosis not present

## 2012-08-29 DIAGNOSIS — R32 Unspecified urinary incontinence: Secondary | ICD-10-CM | POA: Diagnosis not present

## 2012-08-29 DIAGNOSIS — R269 Unspecified abnormalities of gait and mobility: Secondary | ICD-10-CM | POA: Diagnosis not present

## 2012-08-29 DIAGNOSIS — G939 Disorder of brain, unspecified: Secondary | ICD-10-CM | POA: Diagnosis not present

## 2012-08-29 DIAGNOSIS — G35 Multiple sclerosis: Secondary | ICD-10-CM | POA: Diagnosis not present

## 2012-09-02 DIAGNOSIS — G939 Disorder of brain, unspecified: Secondary | ICD-10-CM | POA: Diagnosis not present

## 2012-09-02 DIAGNOSIS — I10 Essential (primary) hypertension: Secondary | ICD-10-CM | POA: Diagnosis not present

## 2012-09-02 DIAGNOSIS — R32 Unspecified urinary incontinence: Secondary | ICD-10-CM | POA: Diagnosis not present

## 2012-09-02 DIAGNOSIS — G35 Multiple sclerosis: Secondary | ICD-10-CM | POA: Diagnosis not present

## 2012-09-02 DIAGNOSIS — R269 Unspecified abnormalities of gait and mobility: Secondary | ICD-10-CM | POA: Diagnosis not present

## 2012-10-26 DIAGNOSIS — Z1231 Encounter for screening mammogram for malignant neoplasm of breast: Secondary | ICD-10-CM | POA: Diagnosis not present

## 2012-11-01 DIAGNOSIS — G35 Multiple sclerosis: Secondary | ICD-10-CM | POA: Diagnosis not present

## 2012-11-01 DIAGNOSIS — R269 Unspecified abnormalities of gait and mobility: Secondary | ICD-10-CM | POA: Diagnosis not present

## 2012-11-01 DIAGNOSIS — G939 Disorder of brain, unspecified: Secondary | ICD-10-CM | POA: Diagnosis not present

## 2012-11-01 DIAGNOSIS — R32 Unspecified urinary incontinence: Secondary | ICD-10-CM | POA: Diagnosis not present

## 2012-11-01 DIAGNOSIS — I10 Essential (primary) hypertension: Secondary | ICD-10-CM | POA: Diagnosis not present

## 2012-11-08 DIAGNOSIS — M25559 Pain in unspecified hip: Secondary | ICD-10-CM | POA: Diagnosis not present

## 2012-11-08 DIAGNOSIS — H6120 Impacted cerumen, unspecified ear: Secondary | ICD-10-CM | POA: Diagnosis not present

## 2012-12-27 ENCOUNTER — Ambulatory Visit (INDEPENDENT_AMBULATORY_CARE_PROVIDER_SITE_OTHER): Payer: Medicare Other | Admitting: Neurology

## 2012-12-27 ENCOUNTER — Encounter: Payer: Self-pay | Admitting: Neurology

## 2012-12-27 VITALS — BP 153/78 | HR 74

## 2012-12-27 DIAGNOSIS — G500 Trigeminal neuralgia: Secondary | ICD-10-CM | POA: Diagnosis not present

## 2012-12-27 DIAGNOSIS — Z5181 Encounter for therapeutic drug level monitoring: Secondary | ICD-10-CM | POA: Diagnosis not present

## 2012-12-27 DIAGNOSIS — G35 Multiple sclerosis: Secondary | ICD-10-CM | POA: Diagnosis not present

## 2012-12-27 NOTE — Progress Notes (Signed)
   Reason for visit: Multiple sclerosis  [name] is an 67 y.o. female  History of present illness:  [name] is a 66 year old right-handed black female with a history of end-stage multiple sclerosis. The patient has had reduced trigeminal neuralgia pain recently. The son indicates that the pain is generally better in the wintertime, worse in the summer. The patient has been eating fairly well. The patient is resting at night, and she sleeps a lot during the day. The patient has had some ongoing problems with urinary incontinence. The patient has not had any active skin breakdown however. The patient has a very limited ability to ambulate, and she can walk about 10 steps at a time. The patient has not had any recent falls. The patient is on Avonex, and she is tolerating the medication fairly well. The patient returns for an evaluation.    ROS:  Out of a complete 14 system review of symptoms, the patient complains only of the following symptoms, and all other reviewed systems are negative.  Fatigue Rash Incontinence of bowel Constipation Incontinence of bladder Feeling hot or cold Aching muscles Confusion, memory loss Weakness Disinterest in activities   Blood pressure 153/78, pulse 74.  Physical Exam  General: The patient is alert and cooperative at the time of the examination.  Skin: No significant peripheral edema is noted. The right foot has an AFO brace.   Neurologic Exam  Cranial nerves: Facial symmetry is present. Speech is normal, no aphasia or dysarthria is noted. Extraocular movements are full. Visual fields are full. Pupils are equal, round, and reactive to light. Discs are flat bilaterally.  Motor: With motor testing, the patient has good strength of both upper extremities. With the lower extremities, the patient has proximal and distal weakness, 3/5 in the right leg. The patient has 4-/5 strength throughout in the left leg.  Coordination: The patient  is able to perform finger-nose-finger bilaterally, the patient cannot perform heel-to-shin on either side.  Gait and station: The patient is wheelchair-bound, she could not be ambulated.  Reflexes: Deep tendon reflexes are symmetric.   Assessment/Plan:  One. Multiple sclerosis  2. Gait disorder  3. Dementia  4. Neurogenic bladder  The patient will continue the Avonex for now. Blood work will be done today. The patient is getting some benefit with the use of carbamazepine and Ultram if needed. The patient will followup in 6 or 7 months.   [name] MD 12/27/2012 9:40 AM

## 2012-12-28 ENCOUNTER — Encounter: Payer: Self-pay | Admitting: Neurology

## 2012-12-28 LAB — CBC WITH DIFFERENTIAL/PLATELET

## 2012-12-28 LAB — CARBAMAZEPINE LEVEL, TOTAL

## 2012-12-29 LAB — CBC WITH DIFFERENTIAL/PLATELET
Basos: 1 % (ref 0–3)
Eosinophils Absolute: 0 10*3/uL (ref 0.0–0.4)
HCT: 35.6 % (ref 34.0–46.6)
Hemoglobin: 11.8 g/dL (ref 11.1–15.9)
Lymphocytes Absolute: 1.2 10*3/uL (ref 0.7–3.1)
MCH: 31.5 pg (ref 26.6–33.0)
MCV: 95 fL (ref 79–97)
Monocytes Absolute: 0.8 10*3/uL (ref 0.1–0.9)
Neutrophils Absolute: 2 10*3/uL (ref 1.4–7.0)
Neutrophils Relative %: 49 % (ref 40–74)
RBC: 3.75 x10E6/uL — ABNORMAL LOW (ref 3.77–5.28)

## 2012-12-31 ENCOUNTER — Telehealth: Payer: Self-pay | Admitting: Neurology

## 2012-12-31 DIAGNOSIS — R269 Unspecified abnormalities of gait and mobility: Secondary | ICD-10-CM | POA: Diagnosis not present

## 2012-12-31 DIAGNOSIS — G939 Disorder of brain, unspecified: Secondary | ICD-10-CM | POA: Diagnosis not present

## 2012-12-31 DIAGNOSIS — G35 Multiple sclerosis: Secondary | ICD-10-CM | POA: Diagnosis not present

## 2012-12-31 DIAGNOSIS — R32 Unspecified urinary incontinence: Secondary | ICD-10-CM | POA: Diagnosis not present

## 2012-12-31 DIAGNOSIS — I10 Essential (primary) hypertension: Secondary | ICD-10-CM | POA: Diagnosis not present

## 2012-12-31 NOTE — Telephone Encounter (Signed)
Diane, RN calling from [facility] needing recertification for [facility] for avonex injections.  Pt is homebound.  I gave the verbal ok.

## 2012-12-31 NOTE — Telephone Encounter (Signed)
Noted. I agree with redo will of the Avonex, recertification.

## 2013-01-01 ENCOUNTER — Telehealth: Payer: Self-pay | Admitting: *Deleted

## 2013-01-01 NOTE — Telephone Encounter (Signed)
I called the patient talked with the son. The blood work was relatively unremarkable, the patient has a good carbamazepine level. The patient has been having more trigeminal neuralgia pain however. They will call me if this discomfort gets worse.

## 2013-01-01 NOTE — Telephone Encounter (Signed)
Shanele  with [facility] is calling about avenex patient's referraland prefill syringe, needs to switch to the pen prescription ,if you know the pharmacy,may fax the prescription directly to them, if not must need patient's consent, then do a benefit

## 2013-01-02 NOTE — Telephone Encounter (Signed)
I was out of the office yesterday.  JCB Per previous phone note Andrey Camp[employer] Banker) gave verbal consent for rx and Dr [name] agreed.

## 2013-01-03 ENCOUNTER — Telehealth: Payer: Self-pay | Admitting: *Deleted

## 2013-01-03 NOTE — Telephone Encounter (Signed)
Message copied by Janey Greaser on Thu Jan 03, 2013  5:36 PM ------      Message from: Stephanie Acre      Created: Sat Dec 29, 2012  5:46 PM       The blood work looks good, carbamazepine level is ok. Please call the patient. ------

## 2013-01-03 NOTE — Telephone Encounter (Signed)
I and spoke with patient's son about lab results being normal.

## 2013-01-08 ENCOUNTER — Other Ambulatory Visit: Payer: Self-pay | Admitting: Neurology

## 2013-02-07 ENCOUNTER — Other Ambulatory Visit: Payer: Self-pay | Admitting: Neurology

## 2013-02-14 DIAGNOSIS — F329 Major depressive disorder, single episode, unspecified: Secondary | ICD-10-CM | POA: Diagnosis not present

## 2013-02-14 DIAGNOSIS — E7800 Pure hypercholesterolemia, unspecified: Secondary | ICD-10-CM | POA: Diagnosis not present

## 2013-02-14 DIAGNOSIS — I10 Essential (primary) hypertension: Secondary | ICD-10-CM | POA: Diagnosis not present

## 2013-02-14 DIAGNOSIS — Z79899 Other long term (current) drug therapy: Secondary | ICD-10-CM | POA: Diagnosis not present

## 2013-02-15 ENCOUNTER — Other Ambulatory Visit: Payer: Self-pay

## 2013-02-15 DIAGNOSIS — G35 Multiple sclerosis: Secondary | ICD-10-CM | POA: Diagnosis not present

## 2013-02-15 MED ORDER — DONEPEZIL HCL 5 MG PO TABS: 5.0000 mg | ORAL_TABLET | Freq: Every day | ORAL | Status: DC

## 2013-02-15 MED ORDER — BACLOFEN 10 MG PO TABS: ORAL_TABLET | ORAL | Status: DC

## 2013-02-19 ENCOUNTER — Other Ambulatory Visit: Payer: Self-pay | Admitting: Neurology

## 2013-02-20 ENCOUNTER — Other Ambulatory Visit: Payer: Self-pay | Admitting: Neurology

## 2013-02-28 ENCOUNTER — Telehealth: Payer: Self-pay

## 2013-02-28 DIAGNOSIS — G35 Multiple sclerosis: Secondary | ICD-10-CM

## 2013-02-28 NOTE — Telephone Encounter (Signed)
Son called and left message in clinic saying [facility] told them to call us about getting a new re certification allowing them to come to the home and administer shots for the patient.  He says he forgot to call sooner and hopes we can get this done right away.  Call back number for him is [phone] or [phone].

## 2013-03-01 DIAGNOSIS — G939 Disorder of brain, unspecified: Secondary | ICD-10-CM | POA: Diagnosis not present

## 2013-03-01 DIAGNOSIS — R32 Unspecified urinary incontinence: Secondary | ICD-10-CM | POA: Diagnosis not present

## 2013-03-01 DIAGNOSIS — G35 Multiple sclerosis: Secondary | ICD-10-CM | POA: Diagnosis not present

## 2013-03-01 DIAGNOSIS — I10 Essential (primary) hypertension: Secondary | ICD-10-CM | POA: Diagnosis not present

## 2013-03-01 DIAGNOSIS — R269 Unspecified abnormalities of gait and mobility: Secondary | ICD-10-CM | POA: Diagnosis not present

## 2013-03-06 DIAGNOSIS — G939 Disorder of brain, unspecified: Secondary | ICD-10-CM | POA: Diagnosis not present

## 2013-03-06 DIAGNOSIS — I10 Essential (primary) hypertension: Secondary | ICD-10-CM | POA: Diagnosis not present

## 2013-03-06 DIAGNOSIS — G35 Multiple sclerosis: Secondary | ICD-10-CM | POA: Diagnosis not present

## 2013-03-06 DIAGNOSIS — R32 Unspecified urinary incontinence: Secondary | ICD-10-CM | POA: Diagnosis not present

## 2013-03-06 DIAGNOSIS — R269 Unspecified abnormalities of gait and mobility: Secondary | ICD-10-CM | POA: Diagnosis not present

## 2013-03-06 NOTE — Telephone Encounter (Signed)
Spoke with son [name]).  Pt is getting weekly injections of Avonex from [facility]) nurse and [facility] requires a new certification or re-certification, to continue.  Pt's son stated that they were going to switch to a "pen" for the weekly injections but if they do, insurance will not allow the weekly nurse visit.

## 2013-03-06 NOTE — Telephone Encounter (Signed)
I called the son. The patient is getting home health nursing, and did Avonex injection. They're not going to switch to the Avonex Pen, as they may lose the home health nursing. I will renew the home nursing order.

## 2013-03-08 ENCOUNTER — Other Ambulatory Visit: Payer: Self-pay | Admitting: Obstetrics and Gynecology

## 2013-03-08 ENCOUNTER — Other Ambulatory Visit (HOSPITAL_COMMUNITY)
Admission: RE | Admit: 2013-03-08 | Discharge: 2013-03-08 | Disposition: A | Discharge status: Home / Self Care | Payer: Medicare Other | Source: Ambulatory Visit | Attending: Obstetrics and Gynecology | Admitting: Obstetrics and Gynecology

## 2013-03-08 DIAGNOSIS — Z01419 Encounter for gynecological examination (general) (routine) without abnormal findings: Secondary | ICD-10-CM | POA: Diagnosis not present

## 2013-03-08 DIAGNOSIS — N9489 Other specified conditions associated with female genital organs and menstrual cycle: Secondary | ICD-10-CM | POA: Diagnosis not present

## 2013-03-08 DIAGNOSIS — N841 Polyp of cervix uteri: Secondary | ICD-10-CM | POA: Diagnosis not present

## 2013-03-08 DIAGNOSIS — Z1151 Encounter for screening for human papillomavirus (HPV): Secondary | ICD-10-CM | POA: Diagnosis not present

## 2013-03-14 DIAGNOSIS — R32 Unspecified urinary incontinence: Secondary | ICD-10-CM | POA: Diagnosis not present

## 2013-03-14 DIAGNOSIS — I10 Essential (primary) hypertension: Secondary | ICD-10-CM | POA: Diagnosis not present

## 2013-03-14 DIAGNOSIS — G35 Multiple sclerosis: Secondary | ICD-10-CM | POA: Diagnosis not present

## 2013-03-14 DIAGNOSIS — G939 Disorder of brain, unspecified: Secondary | ICD-10-CM | POA: Diagnosis not present

## 2013-03-14 DIAGNOSIS — R269 Unspecified abnormalities of gait and mobility: Secondary | ICD-10-CM | POA: Diagnosis not present

## 2013-03-20 DIAGNOSIS — G939 Disorder of brain, unspecified: Secondary | ICD-10-CM | POA: Diagnosis not present

## 2013-03-20 DIAGNOSIS — G35 Multiple sclerosis: Secondary | ICD-10-CM | POA: Diagnosis not present

## 2013-03-20 DIAGNOSIS — I10 Essential (primary) hypertension: Secondary | ICD-10-CM | POA: Diagnosis not present

## 2013-03-20 DIAGNOSIS — R269 Unspecified abnormalities of gait and mobility: Secondary | ICD-10-CM | POA: Diagnosis not present

## 2013-03-20 DIAGNOSIS — R32 Unspecified urinary incontinence: Secondary | ICD-10-CM | POA: Diagnosis not present

## 2013-03-27 DIAGNOSIS — G35 Multiple sclerosis: Secondary | ICD-10-CM | POA: Diagnosis not present

## 2013-03-27 DIAGNOSIS — I10 Essential (primary) hypertension: Secondary | ICD-10-CM | POA: Diagnosis not present

## 2013-03-27 DIAGNOSIS — R32 Unspecified urinary incontinence: Secondary | ICD-10-CM | POA: Diagnosis not present

## 2013-03-27 DIAGNOSIS — G939 Disorder of brain, unspecified: Secondary | ICD-10-CM | POA: Diagnosis not present

## 2013-03-27 DIAGNOSIS — R269 Unspecified abnormalities of gait and mobility: Secondary | ICD-10-CM | POA: Diagnosis not present

## 2013-03-28 ENCOUNTER — Other Ambulatory Visit: Payer: Self-pay | Admitting: Obstetrics and Gynecology

## 2013-03-28 DIAGNOSIS — N841 Polyp of cervix uteri: Secondary | ICD-10-CM | POA: Diagnosis not present

## 2013-03-28 DIAGNOSIS — N9489 Other specified conditions associated with female genital organs and menstrual cycle: Secondary | ICD-10-CM | POA: Diagnosis not present

## 2013-04-03 DIAGNOSIS — I10 Essential (primary) hypertension: Secondary | ICD-10-CM | POA: Diagnosis not present

## 2013-04-03 DIAGNOSIS — G35 Multiple sclerosis: Secondary | ICD-10-CM | POA: Diagnosis not present

## 2013-04-03 DIAGNOSIS — R269 Unspecified abnormalities of gait and mobility: Secondary | ICD-10-CM | POA: Diagnosis not present

## 2013-04-03 DIAGNOSIS — G939 Disorder of brain, unspecified: Secondary | ICD-10-CM | POA: Diagnosis not present

## 2013-04-03 DIAGNOSIS — R32 Unspecified urinary incontinence: Secondary | ICD-10-CM | POA: Diagnosis not present

## 2013-04-04 ENCOUNTER — Other Ambulatory Visit: Payer: Self-pay | Admitting: Obstetrics and Gynecology

## 2013-04-04 DIAGNOSIS — N949 Unspecified condition associated with female genital organs and menstrual cycle: Secondary | ICD-10-CM

## 2013-04-05 ENCOUNTER — Telehealth: Payer: Self-pay | Admitting: Neurology

## 2013-04-05 NOTE — Telephone Encounter (Signed)
Ok to give PT order.

## 2013-04-05 NOTE — Telephone Encounter (Signed)
WID: [facility] calling to get a verbal order for a PT evaluation if needed.

## 2013-04-05 NOTE — Telephone Encounter (Signed)
Message copied by Elisha Headland on Fri Apr 05, 2013  4:02 PM ------      Message from: [facility] [place]      Created: Wed Apr 03, 2013 12:47 PM      Regarding: Pt needs Pt eval order or file      Contact: [phone]       Home needs to get a phone order  For a PT eval if needed per  Alexea with [facility].  Please call. ------

## 2013-04-10 ENCOUNTER — Telehealth: Payer: Self-pay | Admitting: Neurology

## 2013-04-10 DIAGNOSIS — G35 Multiple sclerosis: Secondary | ICD-10-CM | POA: Diagnosis not present

## 2013-04-10 DIAGNOSIS — I10 Essential (primary) hypertension: Secondary | ICD-10-CM | POA: Diagnosis not present

## 2013-04-10 DIAGNOSIS — G939 Disorder of brain, unspecified: Secondary | ICD-10-CM | POA: Diagnosis not present

## 2013-04-10 DIAGNOSIS — R269 Unspecified abnormalities of gait and mobility: Secondary | ICD-10-CM | POA: Diagnosis not present

## 2013-04-10 DIAGNOSIS — R32 Unspecified urinary incontinence: Secondary | ICD-10-CM | POA: Diagnosis not present

## 2013-04-10 NOTE — Telephone Encounter (Signed)
Returned [employer] call from [employer]. Gave verbal order for PT evaluation per Dr. Zannie Cove note on 06/27014 [facility]). Alexis agreed.

## 2013-04-10 NOTE — Telephone Encounter (Signed)
I spoke with Alexa and gave verbal order if needed to start PT evaluation, per Dr. Terrace Arabia.

## 2013-04-11 DIAGNOSIS — G35 Multiple sclerosis: Secondary | ICD-10-CM

## 2013-04-16 ENCOUNTER — Ambulatory Visit
Admission: RE | Admit: 2013-04-16 | Discharge: 2013-04-16 | Disposition: A | Discharge status: Home / Self Care | Payer: Medicare Other | Source: Ambulatory Visit | Attending: Obstetrics and Gynecology | Admitting: Obstetrics and Gynecology

## 2013-04-16 DIAGNOSIS — D252 Subserosal leiomyoma of uterus: Secondary | ICD-10-CM | POA: Diagnosis not present

## 2013-04-16 DIAGNOSIS — N949 Unspecified condition associated with female genital organs and menstrual cycle: Secondary | ICD-10-CM

## 2013-04-16 MED ORDER — GADOBENATE DIMEGLUMINE 529 MG/ML IV SOLN
14.0000 mL | Freq: Once | INTRAVENOUS | Status: AC | PRN
  Administered 2013-04-16: 14 mL via INTRAVENOUS

## 2013-04-17 DIAGNOSIS — G939 Disorder of brain, unspecified: Secondary | ICD-10-CM | POA: Diagnosis not present

## 2013-04-17 DIAGNOSIS — I10 Essential (primary) hypertension: Secondary | ICD-10-CM | POA: Diagnosis not present

## 2013-04-17 DIAGNOSIS — R269 Unspecified abnormalities of gait and mobility: Secondary | ICD-10-CM | POA: Diagnosis not present

## 2013-04-17 DIAGNOSIS — R32 Unspecified urinary incontinence: Secondary | ICD-10-CM | POA: Diagnosis not present

## 2013-04-17 DIAGNOSIS — G35 Multiple sclerosis: Secondary | ICD-10-CM | POA: Diagnosis not present

## 2013-04-18 DIAGNOSIS — G939 Disorder of brain, unspecified: Secondary | ICD-10-CM | POA: Diagnosis not present

## 2013-04-18 DIAGNOSIS — R269 Unspecified abnormalities of gait and mobility: Secondary | ICD-10-CM | POA: Diagnosis not present

## 2013-04-18 DIAGNOSIS — R32 Unspecified urinary incontinence: Secondary | ICD-10-CM | POA: Diagnosis not present

## 2013-04-18 DIAGNOSIS — G35 Multiple sclerosis: Secondary | ICD-10-CM | POA: Diagnosis not present

## 2013-04-18 DIAGNOSIS — I10 Essential (primary) hypertension: Secondary | ICD-10-CM | POA: Diagnosis not present

## 2013-04-24 DIAGNOSIS — G939 Disorder of brain, unspecified: Secondary | ICD-10-CM | POA: Diagnosis not present

## 2013-04-24 DIAGNOSIS — I10 Essential (primary) hypertension: Secondary | ICD-10-CM | POA: Diagnosis not present

## 2013-04-24 DIAGNOSIS — G35 Multiple sclerosis: Secondary | ICD-10-CM | POA: Diagnosis not present

## 2013-04-24 DIAGNOSIS — R269 Unspecified abnormalities of gait and mobility: Secondary | ICD-10-CM | POA: Diagnosis not present

## 2013-04-24 DIAGNOSIS — R32 Unspecified urinary incontinence: Secondary | ICD-10-CM | POA: Diagnosis not present

## 2013-04-25 DIAGNOSIS — G35 Multiple sclerosis: Secondary | ICD-10-CM | POA: Diagnosis not present

## 2013-04-25 DIAGNOSIS — R32 Unspecified urinary incontinence: Secondary | ICD-10-CM | POA: Diagnosis not present

## 2013-04-25 DIAGNOSIS — I10 Essential (primary) hypertension: Secondary | ICD-10-CM | POA: Diagnosis not present

## 2013-04-25 DIAGNOSIS — R269 Unspecified abnormalities of gait and mobility: Secondary | ICD-10-CM | POA: Diagnosis not present

## 2013-04-25 DIAGNOSIS — G939 Disorder of brain, unspecified: Secondary | ICD-10-CM | POA: Diagnosis not present

## 2013-04-26 ENCOUNTER — Telehealth: Payer: Self-pay | Admitting: *Deleted

## 2013-04-26 NOTE — Telephone Encounter (Signed)
[name] with [facility] calling needing recertification re: continuing to administer avonex injections in the home.   I gave the verbal ok.

## 2013-04-27 NOTE — Telephone Encounter (Signed)
Verbal orders noted. Agree with orders given.

## 2013-04-30 DIAGNOSIS — R32 Unspecified urinary incontinence: Secondary | ICD-10-CM | POA: Diagnosis not present

## 2013-04-30 DIAGNOSIS — R269 Unspecified abnormalities of gait and mobility: Secondary | ICD-10-CM | POA: Diagnosis not present

## 2013-04-30 DIAGNOSIS — I10 Essential (primary) hypertension: Secondary | ICD-10-CM | POA: Diagnosis not present

## 2013-04-30 DIAGNOSIS — G939 Disorder of brain, unspecified: Secondary | ICD-10-CM | POA: Diagnosis not present

## 2013-04-30 DIAGNOSIS — G35 Multiple sclerosis: Secondary | ICD-10-CM | POA: Diagnosis not present

## 2013-05-01 ENCOUNTER — Telehealth: Payer: Self-pay | Admitting: Neurology

## 2013-05-01 NOTE — Telephone Encounter (Signed)
[name] a PT with [facility] wanted approval to continue home PT, one day a week for the next 3 weeks. She wants to teach son and caregiver transfers, general mobility and balance.

## 2013-05-01 NOTE — Telephone Encounter (Signed)
I called Olegario Messier, the physical therapist. I gave verbal approval for the extra 3 weeks of physical therapy.

## 2013-05-14 ENCOUNTER — Telehealth: Payer: Self-pay | Admitting: Neurology

## 2013-05-17 NOTE — Telephone Encounter (Signed)
I'll write a prescription for this. The prescription was written on a regular prescription bed cannot do In EPIC

## 2013-05-17 NOTE — Telephone Encounter (Signed)
Spoke to [place]. Says PT recommended ASO for patient's L foot. Requesting rx.

## 2013-06-18 ENCOUNTER — Other Ambulatory Visit: Payer: Self-pay | Admitting: Neurology

## 2013-06-27 ENCOUNTER — Telehealth: Payer: Self-pay | Admitting: *Deleted

## 2013-06-27 NOTE — Telephone Encounter (Signed)
Alexis with the RN from [facility] who goes out and see's pt for Avonex injection on this pt.  They had case meeting and feel that the son can be giving the injection with the (avonex pen) if this is prescribed.  Son stated that he likes having a nurse to come out and check on his mother.  Alexis and the [facility] team feel that he can be taught to give this injection.  I relayed that pt has appt on 07-01-13 with C.Martin/NP and this can be discussed then.  She requested that this be strongly encouraged.  Jon Gills stated that they can be verbally given the order (for 5 times) so as to include the teaching involved with avonex pen injectable. (transition).  Will forward to Dr. [name] and Carolyn/NP FYI. [phone] Jon Gills to be called after 07-01-13 appt.

## 2013-06-29 DIAGNOSIS — G35 Multiple sclerosis: Secondary | ICD-10-CM | POA: Diagnosis not present

## 2013-06-29 DIAGNOSIS — R32 Unspecified urinary incontinence: Secondary | ICD-10-CM | POA: Diagnosis not present

## 2013-06-29 DIAGNOSIS — R269 Unspecified abnormalities of gait and mobility: Secondary | ICD-10-CM | POA: Diagnosis not present

## 2013-06-29 DIAGNOSIS — I10 Essential (primary) hypertension: Secondary | ICD-10-CM | POA: Diagnosis not present

## 2013-06-29 DIAGNOSIS — G939 Disorder of brain, unspecified: Secondary | ICD-10-CM | POA: Diagnosis not present

## 2013-07-01 ENCOUNTER — Encounter: Payer: Self-pay | Admitting: Nurse Practitioner

## 2013-07-01 ENCOUNTER — Ambulatory Visit (INDEPENDENT_AMBULATORY_CARE_PROVIDER_SITE_OTHER): Payer: Medicare Other | Admitting: Nurse Practitioner

## 2013-07-01 VITALS — BP 155/77 | HR 70

## 2013-07-01 DIAGNOSIS — Z79899 Other long term (current) drug therapy: Secondary | ICD-10-CM | POA: Diagnosis not present

## 2013-07-01 DIAGNOSIS — F09 Unspecified mental disorder due to known physiological condition: Secondary | ICD-10-CM

## 2013-07-01 DIAGNOSIS — G35 Multiple sclerosis: Secondary | ICD-10-CM | POA: Diagnosis not present

## 2013-07-01 DIAGNOSIS — F079 Unspecified personality and behavioral disorder due to known physiological condition: Secondary | ICD-10-CM

## 2013-07-01 DIAGNOSIS — G35D Multiple sclerosis, unspecified: Secondary | ICD-10-CM

## 2013-07-01 DIAGNOSIS — R269 Unspecified abnormalities of gait and mobility: Secondary | ICD-10-CM | POA: Diagnosis not present

## 2013-07-01 NOTE — Progress Notes (Signed)
[facility]  PATIENT: [name] DOB: [birth year]   REASON FOR VISIT: Followup for multiple sclerosis    HISTORY OF PRESENT ILLNESS: Ms. [name], 67 year-old black female returns for followup. She has a history of end-stage multiple sclerosis. She has had some problems with urinary incontinence, she denies any skin breakdown. She has very limited ability to ambulate and is seated in a wheelchair today. She is on Avonex and has never tried the Avonex pen. She denies side effects to Avonex. The home health nurse gives the injection once a week. Patient also has a history of trigeminal neuralgia which is stable at present. Appetite is reportedly good, sleeping well at night. No recent falls.   REVIEW OF SYSTEMS: Full 14 system review of systems performed and notable only for:  Constitutional: Fatigue Cardiovascular: N/A  Ear/Nose/Throat: N/A  Skin: N/A  Eyes: N/A  Respiratory: N/A  Gastroitestinal: N/A  Genitourinary urinary incontinence Hematology/Lymphatic: N/A  Endocrine: N/A Musculoskeletal: Aching muscles Allergy/Immunology: N/A  Neurological: Confusion Psychiatric: Decreased energy, disinterest in activities   ALLERGIES: Allergies  Allergen Reactions  . Shellfish Allergy     Hives     HOME MEDICATIONS: Outpatient Prescriptions Prior to Visit  Medication Sig Dispense Refill  . ALPRAZolam (XANAX) 0.25 MG tablet Take 0.25 mg by mouth at bedtime as needed. For anxiety      . amLODipine (NORVASC) 10 MG tablet Take 1 tablet (10 mg total) by mouth daily.  30 tablet  3  . atorvastatin (LIPITOR) 40 MG tablet Take 40 mg by mouth daily.        . AVONEX 30 MCG/0.5ML injection Inject 30 mcg Intramuscularly once a week Rotate injection site  1 kit  0  . baclofen (LIORESAL) 10 MG tablet Take one tablet twice daily and two at night  120 tablet  5  . buPROPion (WELLBUTRIN XL) 300 MG 24 hr tablet Take 300 mg by mouth daily.      . carbamazepine (TEGRETOL)  200 MG tablet TAKE ONE TABLET TWICE A DAY, AND 1.5 TABLETS AT NIGHT  105 tablet  6  . donepezil (ARICEPT) 5 MG tablet Take 1 tablet (5 mg total) by mouth daily.  90 tablet  1  . fish oil-omega-3 fatty acids 1000 MG capsule Take 2 g by mouth 3 (three) times daily.      Marland Kitchen HYDROcodone-acetaminophen (VICODIN) 5-500 MG per tablet Take 1 tablet by mouth every 6 (six) hours as needed for pain.      Marland Kitchen labetalol (NORMODYNE) 100 MG tablet Take 100 mg by mouth 2 (two) times daily.      Marland Kitchen losartan (COZAAR) 50 MG tablet Take 50 mg by mouth daily.      . Multiple Vitamins-Minerals (MULTIVITAMINS THER. W/MINERALS) TABS Take 1 tablet by mouth daily.        . potassium chloride SA (K-DUR,KLOR-CON) 20 MEQ tablet Take 20 mEq by mouth 3 (three) times daily.        . sertraline (ZOLOFT) 25 MG tablet Take 25 mg by mouth daily.      Marland Kitchen tolterodine (DETROL LA) 2 MG 24 hr capsule TAKE 1 CAPSULE BY MOUTH AT BEDTIME  30 capsule  3  . traMADol (ULTRAM) 50 MG tablet Take 50 mg by mouth every 6 (six) hours as needed. Maximum dose= 8 tablets per day. For pain       No facility-administered medications prior to visit.    PAST MEDICAL HISTORY: Past Medical History  Diagnosis Date  .  Multiple sclerosis     Right sided weakness and gait disorder, bladder and bowel problems   . Hypertension   . Depression   . 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 Trigeminal neuralgia   . Dyslipidemia   . Fibroids   . Overdose of muscle relaxant     Unintentional baclofen overdose   . Trigeminal neuralgia     Right  . Abnormality of gait   . DVT (deep venous thrombosis)     Left leg  . Neurogenic bladder     PAST SURGICAL HISTORY: Past Surgical History  Procedure Laterality Date  . Strabismus surgery      Right eye   . Gamma knife procedure for rt. trigeminal neuralgia Right   . Left leg dvt Left     FAMILY HISTORY: Family History  Problem Relation Age of Onset  . Hypertension    . Heart disease    . Uterine cancer     . Multiple sclerosis Sister   . Cervical cancer Mother   . Liver disease Father   . Hyperlipidemia Brother   . Pancreatitis Brother     SOCIAL HISTORY: History   Social History  . Marital Status: Divorced    Spouse Name: N/A    Number of Children: 2  . Years of Education: 16   Occupational History  . disabled school teacher    Social History Main Topics  . Smoking status: Never Smoker   . Smokeless tobacco: Never Used  . Alcohol Use: No  . Drug Use: No  . Sexual Activity: No   Other Topics Concern  . Not on file   Social History Narrative   Divorced, has a son and daughter. [name] is very involved in her care. [phone] and [phone]     PHYSICAL EXAM  Filed Vitals:   07/01/13 1504  BP: 155/77  Pulse: 70   Cannot calculate BMI with a height equal to zero.  Generalized: Well developed, in no acute distress  Neurological examination   Mentation: Alert ,  Follows all commands speech and language fluent  Cranial nerve II-XII: Fundoscopic exam reveals flat disc margins.Pupils were equal round reactive to light extraocular movements were full, visual field were full on confrontational test. Facial sensation and strength were normal. hearing was intact to finger rubbing bilaterally. Uvula tongue midline. head turning and shoulder shrug and were normal and symmetric.Tongue protrusion into cheek strength was normal. Motor: normal bulk and tone, full strength in the upper extremities. 3/5 proximal and distal weakness in the right leg, 4/5 in the left leg   Coordination: finger-nose-finger, performed smoothly unable to perform heel-to-shin  Reflexes: Symmetric upper and lower Gait and Station: Wheelchair-bound not ambulated  DIAGNOSTIC DATA (LABS, IMAGING, TESTING) -None for review  ASSESSMENT AND PLAN  67 y.o. year old female  has a past medical history of Multiple sclerosis; Hypertension; Depression; Organic brain syndrome; Trigeminal neuralgia; Dyslipidemia;   Abnormality of gait; DVT (deep venous thrombosis); and Neurogenic bladder. here for followup. Patient is currently on Avonex injection  Will obtain CBC and CMP Discussed Avonex pen and that she may like this method better than the injection, Home health to instruct in use Rx initiated She will followup in 6 months Nilda Riggs, [facility], [facility], APRN  [facility] [address] [place], [place] [address] [phone]

## 2013-07-01 NOTE — Progress Notes (Signed)
I have read the note, and I agree with the clinical assessment and plan.  Sherline Eberwein KEITH   

## 2013-07-01 NOTE — Patient Instructions (Addendum)
Will try Avonex pen We will get labs today Followup in 6 months

## 2013-07-01 NOTE — Telephone Encounter (Signed)
The patient will be seen in the office today. The order for Avonex injections can be given.

## 2013-07-02 LAB — COMPREHENSIVE METABOLIC PANEL
ALT: 17 IU/L (ref 0–32)
AST: 22 IU/L (ref 0–40)
Albumin/Globulin Ratio: 1.9 (ref 1.1–2.5)
Calcium: 9.5 mg/dL (ref 8.6–10.2)
Creatinine, Ser: 0.73 mg/dL (ref 0.57–1.00)
GFR calc Af Amer: 101 mL/min/{1.73_m2} (ref >59)
GFR calc non Af Amer: 87 mL/min/{1.73_m2} (ref >59)
Globulin, Total: 2.3 g/dL (ref 1.5–4.5)
Potassium: 3.9 mmol/L (ref 3.5–5.2)
Sodium: 142 mmol/L (ref 134–144)
Total Bilirubin: 0.2 mg/dL (ref 0.0–1.2)
Total Protein: 6.6 g/dL (ref 6.0–8.5)

## 2013-07-02 LAB — CBC WITH DIFFERENTIAL/PLATELET
Basos: 0 %
Eos: 0 %
Eosinophils Absolute: 0 10*3/uL (ref 0.0–0.4)
HCT: 36.9 % (ref 34.0–46.6)
Hemoglobin: 12.2 g/dL (ref 11.1–15.9)
MCH: 30.7 pg (ref 26.6–33.0)
MCV: 93 fL (ref 79–97)
Monocytes Absolute: 0.5 10*3/uL (ref 0.1–0.9)
Neutrophils Absolute: 2.3 10*3/uL (ref 1.4–7.0)
RBC: 3.98 x10E6/uL (ref 3.77–5.28)
WBC: 4.2 10*3/uL (ref 3.4–10.8)

## 2013-07-03 DIAGNOSIS — G939 Disorder of brain, unspecified: Secondary | ICD-10-CM | POA: Diagnosis not present

## 2013-07-03 DIAGNOSIS — G35 Multiple sclerosis: Secondary | ICD-10-CM | POA: Diagnosis not present

## 2013-07-03 DIAGNOSIS — R32 Unspecified urinary incontinence: Secondary | ICD-10-CM | POA: Diagnosis not present

## 2013-07-03 DIAGNOSIS — R269 Unspecified abnormalities of gait and mobility: Secondary | ICD-10-CM | POA: Diagnosis not present

## 2013-07-03 DIAGNOSIS — I10 Essential (primary) hypertension: Secondary | ICD-10-CM | POA: Diagnosis not present

## 2013-07-03 NOTE — Telephone Encounter (Signed)
I called and spoke to [place] with [facility].  They will continue to give injections and transition the pt to Avonex pen.  She verbalized understanding.  If questions to call us back.

## 2013-07-04 ENCOUNTER — Telehealth: Payer: Self-pay

## 2013-07-04 NOTE — Telephone Encounter (Signed)
Message copied by [facility] on Thu Jul 04, 2013 10:49 AM ------      Message from: Beverely Low      Created: Tue Jul 02, 2013 12:04 PM       Labs look good. Please call patient            ----- Message -----         From: [facility] Lab Results In Interface         Sent: 07/02/2013   5:45 AM           To: Nilda Riggs, NP                   ------

## 2013-07-04 NOTE — Telephone Encounter (Signed)
Message copied by [facility] on Thu Jul 04, 2013 10:46 AM ------      Message from: Beverely Low      Created: Tue Jul 02, 2013 12:04 PM       Labs look good. Please call patient            ----- Message -----         From: [facility] Lab Results In Interface         Sent: 07/02/2013   5:45 AM           To: Nilda Riggs, NP                   ------

## 2013-07-04 NOTE — Telephone Encounter (Signed)
I called and spoke with patient's husband. Labs look good. Be sure to follow up with any scheduled appointments.

## 2013-07-10 DIAGNOSIS — I10 Essential (primary) hypertension: Secondary | ICD-10-CM | POA: Diagnosis not present

## 2013-07-10 DIAGNOSIS — R269 Unspecified abnormalities of gait and mobility: Secondary | ICD-10-CM | POA: Diagnosis not present

## 2013-07-10 DIAGNOSIS — R32 Unspecified urinary incontinence: Secondary | ICD-10-CM | POA: Diagnosis not present

## 2013-07-10 DIAGNOSIS — G35 Multiple sclerosis: Secondary | ICD-10-CM | POA: Diagnosis not present

## 2013-07-10 DIAGNOSIS — G939 Disorder of brain, unspecified: Secondary | ICD-10-CM | POA: Diagnosis not present

## 2013-07-17 DIAGNOSIS — R269 Unspecified abnormalities of gait and mobility: Secondary | ICD-10-CM | POA: Diagnosis not present

## 2013-07-17 DIAGNOSIS — R32 Unspecified urinary incontinence: Secondary | ICD-10-CM | POA: Diagnosis not present

## 2013-07-17 DIAGNOSIS — G35 Multiple sclerosis: Secondary | ICD-10-CM | POA: Diagnosis not present

## 2013-07-17 DIAGNOSIS — I10 Essential (primary) hypertension: Secondary | ICD-10-CM | POA: Diagnosis not present

## 2013-07-17 DIAGNOSIS — G939 Disorder of brain, unspecified: Secondary | ICD-10-CM | POA: Diagnosis not present

## 2013-07-24 DIAGNOSIS — G35 Multiple sclerosis: Secondary | ICD-10-CM | POA: Diagnosis not present

## 2013-07-24 DIAGNOSIS — R32 Unspecified urinary incontinence: Secondary | ICD-10-CM | POA: Diagnosis not present

## 2013-07-24 DIAGNOSIS — I10 Essential (primary) hypertension: Secondary | ICD-10-CM | POA: Diagnosis not present

## 2013-07-24 DIAGNOSIS — G939 Disorder of brain, unspecified: Secondary | ICD-10-CM | POA: Diagnosis not present

## 2013-07-24 DIAGNOSIS — R269 Unspecified abnormalities of gait and mobility: Secondary | ICD-10-CM | POA: Diagnosis not present

## 2013-07-31 ENCOUNTER — Telehealth: Payer: Self-pay | Admitting: Nurse Practitioner

## 2013-07-31 DIAGNOSIS — R269 Unspecified abnormalities of gait and mobility: Secondary | ICD-10-CM | POA: Diagnosis not present

## 2013-07-31 DIAGNOSIS — I10 Essential (primary) hypertension: Secondary | ICD-10-CM | POA: Diagnosis not present

## 2013-07-31 DIAGNOSIS — R32 Unspecified urinary incontinence: Secondary | ICD-10-CM | POA: Diagnosis not present

## 2013-07-31 DIAGNOSIS — G35 Multiple sclerosis: Secondary | ICD-10-CM | POA: Diagnosis not present

## 2013-07-31 DIAGNOSIS — G939 Disorder of brain, unspecified: Secondary | ICD-10-CM | POA: Diagnosis not present

## 2013-07-31 MED ORDER — INTERFERON BETA-1A 30 MCG/0.5ML IM KIT: 30.0000 ug | PACK | INTRAMUSCULAR | Status: DC

## 2013-07-31 NOTE — Telephone Encounter (Signed)
I called and spoke with the patient.  She said she just needs an updated Rx sent to her mail order for the Avonex Pen.  I have updated the Rx on the med list and sent it to the pharmacy.  Patient is aware.  They usually overnight the meds.

## 2013-08-07 DIAGNOSIS — G939 Disorder of brain, unspecified: Secondary | ICD-10-CM | POA: Diagnosis not present

## 2013-08-07 DIAGNOSIS — R32 Unspecified urinary incontinence: Secondary | ICD-10-CM | POA: Diagnosis not present

## 2013-08-07 DIAGNOSIS — G35 Multiple sclerosis: Secondary | ICD-10-CM | POA: Diagnosis not present

## 2013-08-07 DIAGNOSIS — I10 Essential (primary) hypertension: Secondary | ICD-10-CM | POA: Diagnosis not present

## 2013-08-07 DIAGNOSIS — R269 Unspecified abnormalities of gait and mobility: Secondary | ICD-10-CM | POA: Diagnosis not present

## 2013-08-14 DIAGNOSIS — G35 Multiple sclerosis: Secondary | ICD-10-CM | POA: Diagnosis not present

## 2013-08-14 DIAGNOSIS — I10 Essential (primary) hypertension: Secondary | ICD-10-CM | POA: Diagnosis not present

## 2013-08-14 DIAGNOSIS — R269 Unspecified abnormalities of gait and mobility: Secondary | ICD-10-CM | POA: Diagnosis not present

## 2013-08-14 DIAGNOSIS — G939 Disorder of brain, unspecified: Secondary | ICD-10-CM | POA: Diagnosis not present

## 2013-08-14 DIAGNOSIS — R32 Unspecified urinary incontinence: Secondary | ICD-10-CM | POA: Diagnosis not present

## 2013-08-21 DIAGNOSIS — G35 Multiple sclerosis: Secondary | ICD-10-CM | POA: Diagnosis not present

## 2013-08-21 DIAGNOSIS — G939 Disorder of brain, unspecified: Secondary | ICD-10-CM | POA: Diagnosis not present

## 2013-08-21 DIAGNOSIS — R269 Unspecified abnormalities of gait and mobility: Secondary | ICD-10-CM | POA: Diagnosis not present

## 2013-08-21 DIAGNOSIS — I10 Essential (primary) hypertension: Secondary | ICD-10-CM | POA: Diagnosis not present

## 2013-08-21 DIAGNOSIS — R32 Unspecified urinary incontinence: Secondary | ICD-10-CM | POA: Diagnosis not present

## 2013-08-22 ENCOUNTER — Other Ambulatory Visit: Payer: Self-pay | Admitting: Family Medicine

## 2013-08-22 DIAGNOSIS — I10 Essential (primary) hypertension: Secondary | ICD-10-CM | POA: Diagnosis not present

## 2013-08-22 DIAGNOSIS — M25473 Effusion, unspecified ankle: Secondary | ICD-10-CM | POA: Diagnosis not present

## 2013-08-22 DIAGNOSIS — R609 Edema, unspecified: Secondary | ICD-10-CM

## 2013-08-22 DIAGNOSIS — E7800 Pure hypercholesterolemia, unspecified: Secondary | ICD-10-CM | POA: Diagnosis not present

## 2013-08-22 DIAGNOSIS — R52 Pain, unspecified: Secondary | ICD-10-CM

## 2013-08-23 ENCOUNTER — Ambulatory Visit
Admission: RE | Admit: 2013-08-23 | Discharge: 2013-08-23 | Disposition: A | Discharge status: Home / Self Care | Payer: Medicare Other | Source: Ambulatory Visit | Attending: Family Medicine | Admitting: Family Medicine

## 2013-08-23 DIAGNOSIS — R609 Edema, unspecified: Secondary | ICD-10-CM | POA: Diagnosis not present

## 2013-08-23 DIAGNOSIS — R52 Pain, unspecified: Secondary | ICD-10-CM

## 2013-09-02 ENCOUNTER — Other Ambulatory Visit: Payer: Self-pay | Admitting: Neurology

## 2013-09-06 ENCOUNTER — Other Ambulatory Visit: Payer: Self-pay | Admitting: Neurology

## 2013-09-10 ENCOUNTER — Other Ambulatory Visit: Payer: Self-pay | Admitting: Neurology

## 2013-09-11 ENCOUNTER — Telehealth: Payer: Self-pay | Admitting: Neurology

## 2013-09-11 ENCOUNTER — Other Ambulatory Visit: Payer: Self-pay | Admitting: Neurology

## 2013-09-12 NOTE — Telephone Encounter (Signed)
These Rx's were already sent.  I called the pharmacy and spoke with Kathlene November.  He said the patient is on several medications, and some of them were refill too soon (per ins).  They have processed the Rx's we sent.  Said the CBZ was picked up yesterday and they will call the patient when Tramadol is ready.

## 2013-10-26 ENCOUNTER — Other Ambulatory Visit: Payer: Self-pay | Admitting: Neurology

## 2013-10-29 ENCOUNTER — Telehealth: Payer: Medicare Other | Admitting: Neurology

## 2013-10-29 DIAGNOSIS — M21379 Foot drop, unspecified foot: Secondary | ICD-10-CM

## 2013-10-29 DIAGNOSIS — G35 Multiple sclerosis: Secondary | ICD-10-CM

## 2013-10-29 NOTE — Telephone Encounter (Signed)
Patient's son calling about patient's ASL on left ankle and whether or not a new order would be needed. Please call patient's son.

## 2013-10-29 NOTE — Telephone Encounter (Signed)
I called the patient and I talked with the son. The prescription for the left AFO brace was loss. The patient already has one on the right foot. I will rewrite the prescription and have it mailed to them.

## 2014-01-08 ENCOUNTER — Ambulatory Visit: Payer: Medicare Other | Admitting: Neurology

## 2014-01-14 ENCOUNTER — Other Ambulatory Visit: Payer: Self-pay | Admitting: Neurology

## 2014-01-15 ENCOUNTER — Other Ambulatory Visit: Payer: Self-pay | Admitting: Nurse Practitioner

## 2014-01-16 ENCOUNTER — Other Ambulatory Visit: Payer: Self-pay | Admitting: Neurology

## 2014-01-16 ENCOUNTER — Telehealth: Payer: Self-pay | Admitting: Neurology

## 2014-01-16 NOTE — Telephone Encounter (Signed)
This Rx was already sent to the pharmacy.

## 2014-01-16 NOTE — Telephone Encounter (Signed)
Rx was sent on 04/07.  I called the pharmacy and spoke with [facility].  She said the Rx is ready for pick up.  I called the son back.  He is aware Rx was already sent.  He is using old Rx bottle to try to call in refill.

## 2014-01-16 NOTE — Telephone Encounter (Signed)
Patient's son calling to request patient's refill for Carbamazepine, states that she only has enough for today.

## 2014-02-04 ENCOUNTER — Encounter: Payer: Self-pay | Admitting: Neurology

## 2014-02-04 ENCOUNTER — Encounter (INDEPENDENT_AMBULATORY_CARE_PROVIDER_SITE_OTHER): Payer: Self-pay

## 2014-02-04 ENCOUNTER — Ambulatory Visit (INDEPENDENT_AMBULATORY_CARE_PROVIDER_SITE_OTHER): Payer: Medicare Other | Admitting: Neurology

## 2014-02-04 VITALS — BP 149/72 | HR 72

## 2014-02-04 DIAGNOSIS — G500 Trigeminal neuralgia: Secondary | ICD-10-CM

## 2014-02-04 DIAGNOSIS — G35 Multiple sclerosis: Secondary | ICD-10-CM | POA: Diagnosis not present

## 2014-02-04 DIAGNOSIS — R269 Unspecified abnormalities of gait and mobility: Secondary | ICD-10-CM

## 2014-02-04 NOTE — Patient Instructions (Signed)
Multiple Sclerosis  Multiple sclerosis (MS) is a disease of the central nervous system. It leads to loss of the insulating covering of the nerves (myelin sheath) of your brain. When this happens, brain signals do not get transmitted properly or may not get transmitted at all. The symptoms of MS occur in episodes or attacks. These attacks may last weeks to months. There may be long periods of nearly no problems between attacks. The age of onset of MS varies.   CAUSES  The cause of MS is unknown. However, it is more common in the [place] than in the southern [place].  RISK FACTORS  There is a higher incidence of MS in women than in men. MS is not an inherited illness, although your risk of MS is higher if you have a relative with MS.  SIGNS AND SYMPTOMS   The symptoms of MS occur in episodes or attacks. These attacks may last weeks to months. There may be long periods of almost no symptoms between attacks.  The symptoms of MS vary. This is because of the many different ways it affects the central nervous system. The main symptoms of MS include:   Vision problems and eye pain.   Numbness.   Weakness.   Paralysis in your arms, hands, feet, and legs (extremities).   Balance problems.   Tremors.  DIAGNOSIS   Your health care provider can diagnose MS with the help of imaging exams and lab tests. These may include specialized X-ray exams and spinal fluid tests. The best imaging exam to confirm a diagnosis of MS is MRI.  TREATMENT   There is no known cure for MS, but there are medicines that can decrease the number and frequency of attacks. Steroids are often used for short-term relief. Physical and occupational therapy may also help.  HOME CARE INSTRUCTIONS    Take medicines as directed by your health care provider.   Exercise as directed by your health care provider.  SEEK MEDICAL CARE IF:  You begin to feel depressed.  SEEK IMMEDIATE MEDICAL CARE IF:   You develop paralysis.   You develop  problems with bladder, bowel, or sexual function.   You develop mental changes, such as forgetfulness or mood swings.   You have a seizure.  Document Released: 09/23/2000 Document Revised: 07/17/2013 Document Reviewed: 06/03/2013  [employer] Patient Information 2014 [employer].

## 2014-02-04 NOTE — Progress Notes (Addendum)
PATIENT: [name] DOB: Dec 07, 1945  REASON FOR VISIT: follow up HISTORY FROM: patient  HISTORY OF PRESENT ILLNESS: Ms. [name] 68 year-old black female with a history of end-stage multiple sclerosis. She returns today for follow-up. The son states that the patient has been more confused lately. He reports that she is also having more trouble with moving around. The patient primarily uses the wheelchair, the only time she stands is for transfers. She has also starting to lean to the left when she is in her wheelchair and when she is in the bed. The patient states that it's only because she is tired and it's more comfortable to lean that way than sit straight up. She denies any falls. The son reports that she has a history of UTIs and is wondering if that is what is going on now. The patient continues to use the avonex pen and son gives it to her every Wednesday. The patient continues to have trigeminal neuralgia pain in between doses of tegretol. When this happens she is given tramadol or Tylenol and that relieves her pain.  The son reports that it is not as bad as it was. No new medical issues have come up since the last visit.   REVIEW OF SYSTEMS: Full 14 system review of systems performed and notable only for:  Constitutional:fatigue  Eyes: N/A Ear/Nose/Throat: N/A  Skin: N/A  Cardiovascular: N/A  Respiratory: N/A  Gastrointestinal: constipation Genitourinary: N/A Hematology/Lymphatic: N/A  Endocrine: cold intolerance Musculoskeletal:aching muscles, walking difficulty Allergy/Immunology: N/A  Neurological: weakness Psychiatric: confusion and decreased concentration Sleep: daytime sleepiness   ALLERGIES: Allergies  Allergen Reactions  . Shellfish Allergy     Hives     HOME MEDICATIONS: Outpatient Prescriptions Prior to Visit  Medication Sig Dispense Refill  . ALPRAZolam (XANAX) 0.25 MG tablet Take 0.25 mg by mouth at bedtime as needed. For anxiety      .  amLODipine (NORVASC) 10 MG tablet Take 1 tablet (10 mg total) by mouth daily.  30 tablet  3  . atorvastatin (LIPITOR) 40 MG tablet Take 40 mg by mouth daily.        . AVONEX PEN 30 MCG/0.5ML injection Inject 30 mcg Intramuscularly once a week (every 7 days) Rotate injection site  1 kit  1  . baclofen (LIORESAL) 10 MG tablet TAKE ONE TABLET TWICE DAILY AND TWO AT NIGHT  120 tablet  5  . buPROPion (WELLBUTRIN XL) 300 MG 24 hr tablet Take 300 mg by mouth daily.      . carbamazepine (TEGRETOL) 200 MG tablet TAKE ONE TABLET TWICE A DAY, AND 1.5 TABLETS AT NIGHT  105 tablet  6  . donepezil (ARICEPT) 5 MG tablet TAKE 1 TABLET (5 MG TOTAL) BY MOUTH DAILY.  90 tablet  1  . fish oil-omega-3 fatty acids 1000 MG capsule Take 2 g by mouth 3 (three) times daily.      [name] HYDROcodone-acetaminophen (VICODIN) 5-500 MG per tablet Take 1 tablet by mouth every 6 (six) hours as needed for pain.      [name] labetalol (NORMODYNE) 100 MG tablet Take 100 mg by mouth 2 (two) times daily.      [name] losartan (COZAAR) 50 MG tablet Take 50 mg by mouth daily.      . Multiple Vitamins-Minerals (MULTIVITAMINS THER. W/MINERALS) TABS Take 1 tablet by mouth daily.        . potassium chloride SA (K-DUR,KLOR-CON) 20 MEQ tablet Take 20 mEq by mouth 3 (three) times daily.        [name]  sertraline (ZOLOFT) 25 MG tablet Take 25 mg by mouth daily.      [name] tolterodine (DETROL LA) 2 MG 24 hr capsule TAKE 1 CAPSULE BY MOUTH AT BEDTIME  30 capsule  5  . traMADol (ULTRAM) 50 MG tablet TAKE 1 TABLET BY MOUTH EVERY 4 TO 6 HOURS AS NEEDED MUST LAST 28 DAYS  150 tablet  1  . baclofen (LIORESAL) 10 MG tablet TAKE ONE TABLET TWICE DAILY AND TWO AT NIGHT  120 tablet  5  . tolterodine (DETROL LA) 2 MG 24 hr capsule TAKE 1 CAPSULE BY MOUTH AT BEDTIME  30 capsule  5   No facility-administered medications prior to visit.    PAST MEDICAL HISTORY: Past Medical History  Diagnosis Date  . Multiple sclerosis     Right sided weakness and gait disorder, bladder and bowel  problems   . Hypertension   . Depression   . 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 Trigeminal neuralgia   . Dyslipidemia   . Fibroids   . Overdose of muscle relaxant     Unintentional baclofen overdose   . Trigeminal neuralgia     Right  . Abnormality of gait   . DVT (deep venous thrombosis)     Left leg  . Neurogenic bladder     PAST SURGICAL HISTORY: Past Surgical History  Procedure Laterality Date  . Strabismus surgery      Right eye   . Gamma knife procedure for rt. trigeminal neuralgia Right   . Left leg dvt Left     FAMILY HISTORY: Family History  Problem Relation Age of Onset  . Hypertension    . Heart disease    . Uterine cancer    . Multiple sclerosis Sister   . Cervical cancer Mother   . Liver disease Father   . Hyperlipidemia Brother   . Pancreatitis Brother     SOCIAL HISTORY: History   Social History  . Marital Status: Divorced    Spouse Name: N/A    Number of Children: 2  . Years of Education: 16   Occupational History  . disabled school teacher    Social History Main Topics  . Smoking status: Never Smoker   . Smokeless tobacco: Never Used  . Alcohol Use: No  . Drug Use: No  . Sexual Activity: No   Other Topics Concern  . Not on file   Social History Narrative   Divorced, has a son and daughter. Marden [name] is very involved in her care. [phone]      PHYSICAL EXAM  Filed Vitals:   02/04/14 1015  BP: 149/72  Pulse: 72   Body mass index is 0.00 kg/(m^2).  Generalized: Well developed, in no acute distress    Neurological examination  Mentation: Alert oriented to time, place, history taking. Follows all commands speech and language fluent. MMSE 18/30 Cranial nerve II-XII: visual field were full on confrontational test.  Motor: The motor testing reveals 5 over 5 strength in upper extremities and 3/5 in right leg and 4/5 in left leg.  Coordination: Cerebellar testing reveals good finger-nose-finger. Unable to do   heel-to-shin bilaterally.  Gait and station: patient is wheelchair bound.   Reflexes: Deep tendon reflexes are symmetric  DIAGNOSTIC DATA (LABS, IMAGING, TESTING) - I reviewed patient records, labs, notes, testing and imaging myself where available.  Lab Results  Component Value Date   WBC 4.2 07/01/2013   HGB 12.2 07/01/2013  HCT 36.9 07/01/2013   MCV 93 07/01/2013   PLT 471* 10/06/2011      Component Value Date/Time   NA 142 07/01/2013 1547   NA 135 10/06/2011 0525   K 3.9 07/01/2013 1547   CL 99 07/01/2013 1547   CO2 28 07/01/2013 1547   GLUCOSE 85 07/01/2013 1547   GLUCOSE 95 10/06/2011 0525   BUN 18 07/01/2013 1547   BUN 8 10/06/2011 0525   CREATININE 0.73 07/01/2013 1547   CALCIUM 9.5 07/01/2013 1547   PROT 6.6 07/01/2013 1547   PROT 7.0 09/27/2011 2003   ALBUMIN 2.4* 09/27/2011 2003   AST 22 07/01/2013 1547   ALT 17 07/01/2013 1547   ALKPHOS 99 07/01/2013 1547   BILITOT 0.2 07/01/2013 1547   GFRNONAA 87 07/01/2013 1547   GFRAA 101 07/01/2013 1547    ASSESSMENT AND PLAN 68 y.o. year old female  has a past medical history of Multiple sclerosis; Hypertension; Depression; Organic brain syndrome; Trigeminal neuralgia; Dyslipidemia; Fibroids; Overdose of muscle relaxant; Trigeminal neuralgia; Abnormality of gait; DVT (deep venous thrombosis); and Neurogenic bladder. here with   1. Multiple sclerosis 2. Trigeminal neuralgia 3. Gait disorder   The patient has had increased confusion, MMSE 18/30 this visit. Will check a urinalysis looking for a urinary tract infection. Trigeminal Neuralgia has remained stable. Continue taking Carbamezapine and ultram. Will check a Carbamezapine level today. MS has remained stable, continue Avonex, blood work will be done today.  Follow-up in 6 months or sooner if needed.    Ward Givens, MSN, NP-C 02/04/2014, 10:44 AM [facility] [address] [place], [place] [address] [phone]  Note: This document was prepared  with digital dictation and possible smart phrase technology. Any transcriptional errors that result from this process are unintentional. Kathrynn Ducking

## 2014-02-05 ENCOUNTER — Telehealth: Payer: Self-pay | Admitting: Neurology

## 2014-02-05 LAB — COMPREHENSIVE METABOLIC PANEL
A/G RATIO: 1.5 (ref 1.1–2.5)
ALBUMIN: 4.1 g/dL (ref 3.6–4.8)
ALK PHOS: 110 IU/L (ref 39–117)
ALT: 14 IU/L (ref 0–32)
AST: 19 IU/L (ref 0–40)
BUN / CREAT RATIO: 29 — AB (ref 11–26)
BUN: 19 mg/dL (ref 8–27)
CO2: 25 mmol/L (ref 18–29)
Calcium: 9.2 mg/dL (ref 8.7–10.3)
Chloride: 104 mmol/L (ref 96–108)
Creatinine, Ser: 0.65 mg/dL (ref 0.57–1.00)
GFR calc non Af Amer: 93 mL/min/{1.73_m2} (ref >59)
GFR, EST AFRICAN AMERICAN: 108 mL/min/{1.73_m2} (ref >59)
Globulin, Total: 2.7 g/dL (ref 1.5–4.5)
Glucose: 99 mg/dL (ref 65–99)
Potassium: 4 mmol/L (ref 3.5–5.2)
SODIUM: 138 mmol/L (ref 134–144)
Total Bilirubin: 0.2 mg/dL (ref 0.0–1.2)
Total Protein: 6.8 g/dL (ref 6.0–8.5)

## 2014-02-05 LAB — CARBAMAZEPINE LEVEL, TOTAL: Carbamazepine Lvl: 12.3 ug/mL (ref 4.0–12.0)

## 2014-02-05 LAB — URINALYSIS
BILIRUBIN UA: NEGATIVE
GLUCOSE, UA: NEGATIVE
KETONES UA: NEGATIVE
LEUKOCYTES UA: NEGATIVE
Nitrite, UA: NEGATIVE
PH UA: 6 (ref 5.0–7.5)
PROTEIN UA: NEGATIVE
RBC, UA: NEGATIVE
Specific Gravity, UA: 1.03 (ref 1.005–1.030)
Urobilinogen, Ur: 1 mg/dL (ref 0.0–1.9)

## 2014-02-05 LAB — CBC WITH DIFFERENTIAL/PLATELET
Basophils Absolute: 0 10*3/uL (ref 0.0–0.2)
Basos: 0 %
EOS ABS: 0 10*3/uL (ref 0.0–0.4)
EOS: 0 %
HCT: 36.8 % (ref 34.0–46.6)
HEMOGLOBIN: 12.9 g/dL (ref 11.1–15.9)
Lymphocytes Absolute: 1.3 10*3/uL (ref 0.7–3.1)
Lymphs: 28 %
MCH: 31.9 pg (ref 26.6–33.0)
MCHC: 35.1 g/dL (ref 31.5–35.7)
MCV: 91 fL (ref 79–97)
Monocytes Absolute: 0.5 10*3/uL (ref 0.1–0.9)
Monocytes: 10 %
NEUTROS ABS: 2.8 10*3/uL (ref 1.4–7.0)
Neutrophils Relative %: 62 %
RBC: 4.05 x10E6/uL (ref 3.77–5.28)
RDW: 12.9 % (ref 12.3–15.4)
WBC: 4.6 10*3/uL (ref 3.4–10.8)

## 2014-02-05 NOTE — Telephone Encounter (Signed)
I called patient, talked with the son. The blood work was unremarkable, the patient does not have a urinary tract infection. CBC and comprehensive metabolic profile were unremarkable. The Tegretol level is in the low toxic range at 12.3. This could be the etiology of the functional change is noted by the son. We will drop back on the carbamazepine dose taking 200 mg 3 times daily. If the pain significantly worsens on this lower dose, she may have to go back up on the dosing regimen. Currently, she is getting 200 mg in the morning, 200 mg at midday, and 300 mg in the evening.

## 2014-02-13 ENCOUNTER — Other Ambulatory Visit: Payer: Self-pay

## 2014-02-13 MED ORDER — INTERFERON BETA-1A 30 MCG/0.5ML IM KIT: PACK | INTRAMUSCULAR | Status: DC

## 2014-02-24 ENCOUNTER — Other Ambulatory Visit: Payer: Self-pay | Admitting: Neurology

## 2014-02-27 DIAGNOSIS — E7800 Pure hypercholesterolemia, unspecified: Secondary | ICD-10-CM | POA: Diagnosis not present

## 2014-02-27 DIAGNOSIS — F3289 Other specified depressive episodes: Secondary | ICD-10-CM | POA: Diagnosis not present

## 2014-02-27 DIAGNOSIS — F329 Major depressive disorder, single episode, unspecified: Secondary | ICD-10-CM | POA: Diagnosis not present

## 2014-02-27 DIAGNOSIS — Z23 Encounter for immunization: Secondary | ICD-10-CM | POA: Diagnosis not present

## 2014-02-27 DIAGNOSIS — I10 Essential (primary) hypertension: Secondary | ICD-10-CM | POA: Diagnosis not present

## 2014-04-15 ENCOUNTER — Telehealth: Payer: Self-pay | Admitting: Neurology

## 2014-04-15 DIAGNOSIS — M21371 Foot drop, right foot: Secondary | ICD-10-CM

## 2014-04-15 DIAGNOSIS — G35 Multiple sclerosis: Secondary | ICD-10-CM

## 2014-04-15 NOTE — Telephone Encounter (Signed)
Needs another RX for a brace. [employer] wants to go with carbon on both legs so they need an RX for the Right side. Send to [facility] fax number [phone]

## 2014-04-15 NOTE — Telephone Encounter (Signed)
Spoke with patient's son and he said that they went to [facility] [employer] and they said that the carbon ones are better (more light weight), would like for both of them to match,fax to [place] at [phone]

## 2014-04-15 NOTE — Telephone Encounter (Signed)
I will write another prescription for the AFO brace on the right side.

## 2014-07-11 DIAGNOSIS — I10 Essential (primary) hypertension: Secondary | ICD-10-CM | POA: Diagnosis not present

## 2014-07-11 DIAGNOSIS — H2511 Age-related nuclear cataract, right eye: Secondary | ICD-10-CM | POA: Diagnosis not present

## 2014-07-11 DIAGNOSIS — G35 Multiple sclerosis: Secondary | ICD-10-CM | POA: Diagnosis not present

## 2014-07-11 DIAGNOSIS — H40013 Open angle with borderline findings, low risk, bilateral: Secondary | ICD-10-CM | POA: Diagnosis not present

## 2014-07-15 DIAGNOSIS — Z1231 Encounter for screening mammogram for malignant neoplasm of breast: Secondary | ICD-10-CM | POA: Diagnosis not present

## 2014-08-03 ENCOUNTER — Other Ambulatory Visit: Payer: Self-pay | Admitting: Neurology

## 2014-08-07 ENCOUNTER — Ambulatory Visit (INDEPENDENT_AMBULATORY_CARE_PROVIDER_SITE_OTHER): Payer: Medicare Other | Admitting: Neurology

## 2014-08-07 ENCOUNTER — Encounter: Payer: Self-pay | Admitting: Neurology

## 2014-08-07 VITALS — BP 159/71 | HR 72

## 2014-08-07 DIAGNOSIS — G500 Trigeminal neuralgia: Secondary | ICD-10-CM | POA: Diagnosis not present

## 2014-08-07 DIAGNOSIS — F079 Unspecified personality and behavioral disorder due to known physiological condition: Secondary | ICD-10-CM | POA: Diagnosis not present

## 2014-08-07 DIAGNOSIS — G35 Multiple sclerosis: Secondary | ICD-10-CM | POA: Diagnosis not present

## 2014-08-07 DIAGNOSIS — F09 Unspecified mental disorder due to known physiological condition: Secondary | ICD-10-CM

## 2014-08-07 NOTE — Progress Notes (Signed)
Reason for visit: Multiple sclerosis  [name] is an 68 y.o. female  History of present illness:  [name] is a 68 year old right-handed black female with a history of multiple sclerosis, a severe gait disorder, dementia, and trigeminal neuralgia. The patient has done well with her trigeminal neuralgia, with only occasional twinges. The patient is in general well controlled on the carbamazepine. The patient has bilateral AFO braces, and she can stand and take a step or 2 with assistance. The patient has not had any falls since last seen. She continues to have significant memory problems. She remains on Avonex. The patient is able to control her bladder if she goes to the bathroom every 3 hours. The patient has a bowel movement twice a week on average. She is planning to undergo cataract surgery on the left and then the right in November 2015. No other significant medical issues have come up since last seen.  Past Medical History  Diagnosis Date  . Multiple sclerosis     Right sided weakness and gait disorder, bladder and bowel problems   . Hypertension   . Depression   . 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 Trigeminal neuralgia   . Dyslipidemia   . Fibroids   . Overdose of muscle relaxant     Unintentional baclofen overdose   . Trigeminal neuralgia     Right  . Abnormality of gait   . DVT (deep venous thrombosis)     Left leg  . Neurogenic bladder     Past Surgical History  Procedure Laterality Date  . Strabismus surgery      Right eye   . Gamma knife procedure for rt. trigeminal neuralgia Right   . Left leg dvt Left     Family History  Problem Relation Age of Onset  . Hypertension    . Heart disease    . Uterine cancer    . Multiple sclerosis Sister   . Cervical cancer Mother   . Liver disease Father   . Hyperlipidemia Brother   . Pancreatitis Brother     Social history:  reports that she has never smoked. She has never used smokeless tobacco.  She reports that she does not drink alcohol or use illicit drugs.    Allergies  Allergen Reactions  . Shellfish Allergy     Hives     Medications:  Current Outpatient Prescriptions on File Prior to Visit  Medication Sig Dispense Refill  . ALPRAZolam (XANAX) 0.25 MG tablet Take 0.25 mg by mouth at bedtime as needed. For anxiety one half a pill.      Marland Kitchen amLODipine (NORVASC) 10 MG tablet Take 1 tablet (10 mg total) by mouth daily.  30 tablet  3  . atorvastatin (LIPITOR) 40 MG tablet Take 40 mg by mouth daily.        . baclofen (LIORESAL) 10 MG tablet TAKE ONE TABLET TWICE DAILY AND TWO AT NIGHT  120 tablet  5  . buPROPion (WELLBUTRIN XL) 300 MG 24 hr tablet Take 300 mg by mouth daily.      . carbamazepine (TEGRETOL) 200 MG tablet Take 1 tablet (200 mg total) by mouth 3 (three) times daily.  90 tablet  0  . donepezil (ARICEPT) 5 MG tablet TAKE 1 TABLET (5 MG TOTAL) BY MOUTH DAILY.  90 tablet  1  . fish oil-omega-3 fatty acids 1000 MG capsule Take 2 g by mouth 3 (three) times daily.      Marland Kitchen  HYDROcodone-acetaminophen (VICODIN) 5-500 MG per tablet Take 1 tablet by mouth every 6 (six) hours as needed for pain.      Marland Kitchen interferon beta-1a (AVONEX PEN) 30 MCG/0.5ML injection Inject 30 mcg Intramuscularly once a week (every 7 days) Rotate injection site  1 kit  6  . labetalol (NORMODYNE) 100 MG tablet Take 100 mg by mouth 2 (two) times daily.      . Multiple Vitamins-Minerals (MULTIVITAMINS THER. W/MINERALS) TABS Take 1 tablet by mouth daily.        . potassium chloride SA (K-DUR,KLOR-CON) 20 MEQ tablet Take 20 mEq by mouth 3 (three) times daily.        Marland Kitchen tolterodine (DETROL LA) 2 MG 24 hr capsule TAKE 1 CAPSULE BY MOUTH AT BEDTIME  30 capsule  5  . traMADol (ULTRAM) 50 MG tablet TAKE 1 TABLET BY MOUTH EVERY 4 TO 6 HOURS AS NEEDED MUST LAST 28 DAYS  150 tablet  1  . [DISCONTINUED] tolterodine (DETROL LA) 2 MG 24 hr capsule Take 2 mg by mouth daily.       No current facility-administered medications on  file prior to visit.    ROS:  Out of a complete 14 system review of symptoms, the patient complains only of the following symptoms, and all other reviewed systems are negative.  Fatigue Drooling Choking Cold intolerance Constipation Daytime sleepiness Incontinence of the bladder Back pain, walking difficulties Skin rash Memory loss, headache, weakness Agitation, confusion, anxiety  Blood pressure 159/71, pulse 72, height 0' (0 m), weight 0 lb (0 kg).  Physical Exam  General: The patient is alert and cooperative at the time of the examination.  Skin: No significant peripheral edema is noted.   Neurologic Exam  Mental status: The patient is alert and cooperative.  Cranial nerves: Facial symmetry is present. Speech is normal, no aphasia or dysarthria is noted. Extraocular movements are full. Visual fields are full. Pupils are equal, round, and reactive to light. Discs are flat bilaterally.  Motor: The patient has good strength in the upper extremities, with exception that the intrinsic muscles on the right hand are slightly weak. With the lower extremities, the patient has 3/5 strength throughout, bilateral ear fold braces are noted.  Sensory examination: Soft touch sensation is symmetric on the hands and face.  Coordination: The patient has some dysmetria with finger-nose-finger bilaterally. The patient is unable to perform heel-to-shin on either side.  Gait and station: The gait was not tested, the patient is wheelchair-bound.  Reflexes: Deep tendon reflexes are symmetric.   Assessment/Plan:  1. Multiple sclerosis  2. Gait disorder  3. Dementia  4. Trigeminal neuralgia  The patient appears to be stable at this time. She had blood work done in April 2015. The trigeminal neuralgia appears to be under good control. The patient will follow-up in about 6 months. The family will contact our office if issues arise. She will remain on Avonex for now.  Jill Alexanders  MD 08/07/2014 7:19 PM  [facility] [address] [place], [place] [address]  Phone [phone] Fax [phone]

## 2014-08-07 NOTE — Patient Instructions (Signed)

## 2014-08-13 DIAGNOSIS — Z23 Encounter for immunization: Secondary | ICD-10-CM | POA: Diagnosis not present

## 2014-08-13 DIAGNOSIS — L98499 Non-pressure chronic ulcer of skin of other sites with unspecified severity: Secondary | ICD-10-CM | POA: Diagnosis not present

## 2014-08-18 DIAGNOSIS — H268 Other specified cataract: Secondary | ICD-10-CM | POA: Diagnosis not present

## 2014-08-18 DIAGNOSIS — H2513 Age-related nuclear cataract, bilateral: Secondary | ICD-10-CM | POA: Diagnosis not present

## 2014-08-18 DIAGNOSIS — H2512 Age-related nuclear cataract, left eye: Secondary | ICD-10-CM | POA: Diagnosis not present

## 2014-08-19 ENCOUNTER — Other Ambulatory Visit: Payer: Self-pay | Admitting: Neurology

## 2014-08-19 DIAGNOSIS — H2511 Age-related nuclear cataract, right eye: Secondary | ICD-10-CM | POA: Diagnosis not present

## 2014-08-27 ENCOUNTER — Encounter: Payer: Self-pay | Admitting: Neurology

## 2014-08-28 ENCOUNTER — Other Ambulatory Visit: Payer: Self-pay | Admitting: Neurology

## 2014-08-30 ENCOUNTER — Other Ambulatory Visit: Payer: Self-pay | Admitting: Neurology

## 2014-09-01 DIAGNOSIS — H2513 Age-related nuclear cataract, bilateral: Secondary | ICD-10-CM | POA: Diagnosis not present

## 2014-09-01 DIAGNOSIS — H2511 Age-related nuclear cataract, right eye: Secondary | ICD-10-CM | POA: Diagnosis not present

## 2014-09-01 DIAGNOSIS — H259 Unspecified age-related cataract: Secondary | ICD-10-CM | POA: Diagnosis not present

## 2014-09-02 ENCOUNTER — Encounter: Payer: Self-pay | Admitting: Neurology

## 2014-09-24 DIAGNOSIS — I10 Essential (primary) hypertension: Secondary | ICD-10-CM | POA: Diagnosis not present

## 2014-09-24 DIAGNOSIS — G35 Multiple sclerosis: Secondary | ICD-10-CM | POA: Diagnosis not present

## 2014-09-24 DIAGNOSIS — E780 Pure hypercholesterolemia: Secondary | ICD-10-CM | POA: Diagnosis not present

## 2014-09-24 DIAGNOSIS — F419 Anxiety disorder, unspecified: Secondary | ICD-10-CM | POA: Diagnosis not present

## 2014-09-24 DIAGNOSIS — F329 Major depressive disorder, single episode, unspecified: Secondary | ICD-10-CM | POA: Diagnosis not present

## 2014-10-28 ENCOUNTER — Other Ambulatory Visit: Payer: Self-pay | Admitting: Neurology

## 2014-12-03 ENCOUNTER — Telehealth: Payer: Self-pay | Admitting: Neurology

## 2014-12-03 NOTE — Telephone Encounter (Signed)
Patient's son, [name] stated patient is feeling weak lately and pain returned on left side instead of R side.  Please call and advise.

## 2014-12-03 NOTE — Telephone Encounter (Signed)
I called the patient, talk with the son. The patient is having return of neuralgia pain on the left side of the face instead of the usual right side. She is feeling generally weak. I will get a revisit for her.

## 2014-12-04 NOTE — Telephone Encounter (Signed)
Spoke to son and she is scheduled for 12-10-14 at 1145.

## 2014-12-10 ENCOUNTER — Encounter: Payer: Self-pay | Admitting: Neurology

## 2014-12-10 ENCOUNTER — Ambulatory Visit (INDEPENDENT_AMBULATORY_CARE_PROVIDER_SITE_OTHER): Payer: Medicare Other | Admitting: Neurology

## 2014-12-10 VITALS — BP 155/83 | HR 73 | Ht 65.0 in

## 2014-12-10 DIAGNOSIS — R413 Other amnesia: Secondary | ICD-10-CM | POA: Diagnosis not present

## 2014-12-10 DIAGNOSIS — R269 Unspecified abnormalities of gait and mobility: Secondary | ICD-10-CM

## 2014-12-10 DIAGNOSIS — G500 Trigeminal neuralgia: Secondary | ICD-10-CM

## 2014-12-10 DIAGNOSIS — Z5181 Encounter for therapeutic drug level monitoring: Secondary | ICD-10-CM

## 2014-12-10 DIAGNOSIS — G35 Multiple sclerosis: Secondary | ICD-10-CM | POA: Diagnosis not present

## 2014-12-10 HISTORY — DX: Other amnesia: R41.3

## 2014-12-10 MED ORDER — PREGABALIN 25 MG PO CAPS: ORAL_CAPSULE | ORAL | Status: DC

## 2014-12-10 NOTE — Progress Notes (Signed)
Reason for visit: Multiple sclerosis  [name] is an 69 y.o. female  History of present illness:  [name] is a 69 year old right-handed black female with a history of multiple sclerosis. She has continued to progress with her multiple sclerosis, and she is essentially wheelchair-bound at this point. She is able to stand and help with transfers. She has not had any falls since last seen. She is on Avonex, tolerating the medication well. The patient returns to the office today on an urgent basis as her trigeminal neuralgia has flared. Initially, she has had issues on the right side, but it is now affecting the left face. The patient is not able to eat solid foods, she has difficulty taking some for medications that she has to chew. The patient is on carbamazepine taking 200 mg 3 times daily. She has received gamma knife treatment previously for her trigeminal neuralgia with initially good benefit. In the past, the use of prednisone has resulted in oral thrush. She continues to have pain at this point, it is slightly better than what it was last week. The patient has a chronic organic brain syndrome, with a memory disorder associated with her multiple sclerosis.  Past Medical History  Diagnosis Date  . Multiple sclerosis     Right sided weakness and gait disorder, bladder and bowel problems   . Hypertension   . Depression   . 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 Trigeminal neuralgia   . Dyslipidemia   . Fibroids   . Overdose of muscle relaxant     Unintentional baclofen overdose   . Trigeminal neuralgia     Right  . Abnormality of gait   . DVT (deep venous thrombosis)     Left leg  . Neurogenic bladder   . Memory difficulties 12/10/2014    Past Surgical History  Procedure Laterality Date  . Strabismus surgery      Right eye   . Gamma knife procedure for rt. trigeminal neuralgia Right   . Left leg dvt Left     Family History  Problem Relation Age of Onset  .  Hypertension    . Heart disease    . Uterine cancer    . Multiple sclerosis Sister   . Cervical cancer Mother   . Liver disease Father   . Hyperlipidemia Brother   . Pancreatitis Brother     Social history:  reports that she has never smoked. She has never used smokeless tobacco. She reports that she does not drink alcohol or use illicit drugs.    Allergies  Allergen Reactions  . Shellfish Allergy     Hives     Medications:  Prior to Admission medications   Medication Sig Start Date End Date Taking? Authorizing Provider  acetaminophen (TYLENOL) 500 MG tablet Take 500 mg by mouth as needed.   Yes Historical Provider, MD  ALPRAZolam Duanne Moron) 0.25 MG tablet Take 0.25 mg by mouth at bedtime as needed. For anxiety one half a pill. 11/03/11  Yes Shanda Howells, MD  amLODipine (NORVASC) 10 MG tablet Take 1 tablet (10 mg total) by mouth daily. 11/03/11  Yes Shanda Howells, MD  atorvastatin (LIPITOR) 40 MG tablet Take 40 mg by mouth daily.     Yes Historical Provider, MD  AVONEX PEN 30 MCG/0.5ML AJKT Inject 30 mcg Intramuscularly once a week Rotate injection site 08/19/14  Yes Kathrynn Ducking, MD  baclofen (LIORESAL) 10 MG tablet TAKE ONE  TABLET TWICE DAILY AND TWO AT NIGHT 10/28/14  Yes Kathrynn Ducking, MD  buPROPion (WELLBUTRIN XL) 300 MG 24 hr tablet Take 300 mg by mouth daily.   Yes Shanda Howells, MD  carbamazepine (TEGRETOL) 200 MG tablet TAKE 1 TABLET (200 MG TOTAL) BY MOUTH 3 (THREE) TIMES DAILY. 08/31/14  Yes Kathrynn Ducking, MD  Cholecalciferol ([employer] VIT D 5000 HIGH-POTENCY PO) Take 5,000 Int'l Units/L by mouth daily.   Yes Historical Provider, MD  donepezil (ARICEPT) 5 MG tablet TAKE 1 TABLET (5 MG TOTAL) BY MOUTH DAILY. 08/29/14  Yes Kathrynn Ducking, MD  FIBER SELECT GUMMIES PO Take 5 g by mouth daily.   Yes Historical Provider, MD  fish oil-omega-3 fatty acids 1000 MG capsule Take 2 g by mouth 3 (three) times daily.   Yes Shanda Howells, MD  interferon beta-1a (AVONEX PEN) 30  MCG/0.5ML injection Inject 30 mcg Intramuscularly once a week (every 7 days) Rotate injection site 02/13/14  Yes Kathrynn Ducking, MD  labetalol (NORMODYNE) 100 MG tablet Take 100 mg by mouth 2 (two) times daily.   Yes Shanda Howells, MD  losartan (COZAAR) 100 MG tablet Take 100 mg by mouth daily.   Yes Historical Provider, MD  losartan-hydrochlorothiazide Konrad Penta) 100-12.5 MG per tablet  12/09/14  Yes Historical Provider, MD  Multiple Vitamins-Minerals (MULTIVITAMINS THER. W/MINERALS) TABS Take 1 tablet by mouth daily.     Yes Historical Provider, MD  potassium chloride SA (K-DUR,KLOR-CON) 20 MEQ tablet Take 20 mEq by mouth 3 (three) times daily.     Yes Historical Provider, MD  sertraline (ZOLOFT) 50 MG tablet Take 50 mg by mouth daily. One and one half tablet once a day in morning.   Yes Historical Provider, MD  tolterodine (DETROL LA) 2 MG 24 hr capsule TAKE 1 CAPSULE BY MOUTH AT BEDTIME 08/31/14  Yes Kathrynn Ducking, MD  traMADol (ULTRAM) 50 MG tablet TAKE 1 TABLET BY MOUTH EVERY 4 TO 6 HOURS AS NEEDED MUST LAST 28 DAYS 09/06/13  Yes Kathrynn Ducking, MD  HYDROcodone-acetaminophen (VICODIN) 5-500 MG per tablet Take 1 tablet by mouth every 6 (six) hours as needed for pain.    Historical Provider, MD  pregabalin (LYRICA) 25 MG capsule One tablet twice a day for 1 week, then take 2 capsules twice daily 12/10/14   Kathrynn Ducking, MD    ROS:  Out of a complete 14 system review of symptoms, the patient complains only of the following symptoms, and all other reviewed systems are negative.  Fatigue Achy muscles, walking difficulty Weakness Confusion, anxiety Memory disturbance  Blood pressure 155/83, pulse 73, height 5\' 5"  (1.651 m), weight 0 lb (0 kg).  Physical Exam  General: The patient is alert and cooperative at the time of the examination.  Skin: No significant peripheral edema is noted. The patient is wearing bilateral AFO braces.   Neurologic Exam  Mental status: The patient is  oriented x 3.  Cranial nerves: Facial symmetry is present. Speech is normal, no aphasia or dysarthria is noted. Extraocular movements are full. Visual fields are full.  Motor: The patient has good strength in all 4 extremities.  Sensory examination: Soft touch sensation is symmetric on the face, arms, and legs.  Coordination: The patient has good finger-nose-finger bilaterally. The patient is unable to perform heel-to-shin on either side.  Gait and station: The gait was attempted. The patient is not able to fully stand upright with assistance. She has not effectively able to ambulate.  Reflexes: Deep tendon reflexes are symmetric.   Assessment/Plan:  1. Multiple sclerosis  2. Gait disorder  3. Trigeminal neuralgia  4. Memory disorder  The patient has had worsening of her trigeminal neuralgia within the last week or so. Her multiple sclerosis also is gradually getting slightly worse. She will be given Lyrica to add to the carbamazepine taking 25 mg twice daily for one week, then go to 50 mg twice daily. She will be sent for blood work today to get a carbamazepine level. She will follow-up in May 2016. We will adjust the Lyrica dose depending upon her needs. Previously, gabapentin was used and was not effective for the trigeminal neuralgia pain.  Jill Alexanders MD 12/10/2014 1:07 PM  [facility] [address] [place], [place] [address]  Phone [phone] Fax [phone]

## 2014-12-10 NOTE — Patient Instructions (Signed)
Trigeminal Neuralgia  Trigeminal neuralgia is a nerve disorder that causes sudden attacks of severe facial pain. It is caused by damage to the trigeminal nerve, a major nerve in the face. It is more common in women and in the elderly, although it can also happen in younger patients. Attacks last from a few seconds to several minutes and can occur from a couple of times per year to several times per day. Trigeminal neuralgia can be a very distressing and disabling condition. Surgery may be needed in very severe cases if medical treatment does not give relief.  HOME CARE INSTRUCTIONS    If your caregiver prescribed medication to help prevent attacks, take as directed.   To help prevent attacks:   Chew on the unaffected side of the mouth.   Avoid touching your face.   Avoid blasts of hot or cold air.   Men may wish to grow a beard to avoid having to shave.  SEEK IMMEDIATE MEDICAL CARE IF:   Pain is unbearable and your medicine does not help.   You develop new, unexplained symptoms (problems).   You have problems that may be related to a medication you are taking.  Document Released: 09/23/2000 Document Revised: 12/19/2011 Document Reviewed: 07/24/2009  ExitCare Patient Information 2015 [employer]. This information is not intended to replace advice given to you by your health care provider. Make sure you discuss any questions you have with your health care provider.

## 2014-12-11 LAB — CARBAMAZEPINE LEVEL, TOTAL: Carbamazepine Lvl: 8.8 ug/mL (ref 4.0–12.0)

## 2014-12-31 ENCOUNTER — Other Ambulatory Visit: Payer: Self-pay | Admitting: Neurology

## 2014-12-31 NOTE — Telephone Encounter (Signed)
Last prescribed 09/06/2013.

## 2015-01-01 NOTE — Telephone Encounter (Signed)
Duplicate Request.

## 2015-01-06 ENCOUNTER — Telehealth: Payer: Self-pay | Admitting: Neurology

## 2015-01-06 MED ORDER — PREGABALIN 75 MG PO CAPS: 75.0000 mg | ORAL_CAPSULE | Freq: Two times a day (BID) | ORAL | Status: DC

## 2015-01-06 MED ORDER — DEXAMETHASONE 0.5 MG PO TABS: ORAL_TABLET | ORAL | Status: DC

## 2015-01-06 NOTE — Telephone Encounter (Signed)
I called the patient, talk with the son. The trigeminal nerve pain has gotten quite severe, the patient unable to drink and eat well. They are on Lyrica taking 50 g twice daily, I'll get a 75 mg twice daily, and get her on a steroid taper. They are to contact me if the problem continues.

## 2015-01-06 NOTE — Telephone Encounter (Signed)
Patient's son [name], called and stated Rx pregabalin (LYRICA) 25 MG capsule isn't helping with pain any more. Patient has had a bad 2 weeks, having trouble getting around more and pain has worsen.  Questioning if there's an alternative medication that patient could take.  Please call and advise.

## 2015-01-15 ENCOUNTER — Telehealth: Payer: Self-pay | Admitting: *Deleted

## 2015-01-15 NOTE — Telephone Encounter (Signed)
[place] [facility] sent to [facility] and Dr Jannifer Franklin 01-15-15.

## 2015-01-16 NOTE — Telephone Encounter (Signed)
Form signed and returned to [place].

## 2015-01-19 ENCOUNTER — Telehealth: Payer: Self-pay | Admitting: *Deleted

## 2015-01-19 NOTE — Telephone Encounter (Signed)
[place] for [facility] received,completed by Dr Jannifer Franklin and Charisse Klinefelter faxed 01-16-15.

## 2015-02-02 ENCOUNTER — Other Ambulatory Visit: Payer: Self-pay | Admitting: Neurology

## 2015-02-07 ENCOUNTER — Other Ambulatory Visit: Payer: Self-pay | Admitting: Neurology

## 2015-02-07 DIAGNOSIS — G500 Trigeminal neuralgia: Secondary | ICD-10-CM

## 2015-02-07 MED ORDER — CARBAMAZEPINE 200 MG PO TABS: 200.0000 mg | ORAL_TABLET | Freq: Three times a day (TID) | ORAL | Status: DC

## 2015-02-07 NOTE — Progress Notes (Signed)
Son called in that pt tegretol has only 2 tablets left and ask for refill. I noticed pt has refill on 02/02/15 for 3 months. [facility] and found out that pt can not pick up from pharmacy until 02/23/15 as her last refill was 11/25/14 and it was 3 months supply so the insurance would not cover until 02/23/15.   Talked with son and pt has appointment Monday 02/09/15 with Ms. Hassell Done in our office and he is going to discuss with her during the appointment. I will give pt a prescription for 5 days of tegretol and son will pay out of his own pocket to get over the weekend.   Prescription sent to pharmacy and I called pharmacy and they aware of the prescription.  Rosalin Hawking, MD PhD Stroke Neurology 02/07/2015 9:59 AM

## 2015-02-09 ENCOUNTER — Ambulatory Visit (INDEPENDENT_AMBULATORY_CARE_PROVIDER_SITE_OTHER): Payer: Medicare Other | Admitting: Nurse Practitioner

## 2015-02-09 ENCOUNTER — Encounter: Payer: Self-pay | Admitting: Nurse Practitioner

## 2015-02-09 VITALS — BP 137/69 | HR 70

## 2015-02-09 DIAGNOSIS — R269 Unspecified abnormalities of gait and mobility: Secondary | ICD-10-CM

## 2015-02-09 DIAGNOSIS — Z5181 Encounter for therapeutic drug level monitoring: Secondary | ICD-10-CM

## 2015-02-09 DIAGNOSIS — F079 Unspecified personality and behavioral disorder due to known physiological condition: Secondary | ICD-10-CM

## 2015-02-09 DIAGNOSIS — G500 Trigeminal neuralgia: Secondary | ICD-10-CM | POA: Diagnosis not present

## 2015-02-09 DIAGNOSIS — F09 Unspecified mental disorder due to known physiological condition: Secondary | ICD-10-CM

## 2015-02-09 DIAGNOSIS — G35 Multiple sclerosis: Secondary | ICD-10-CM

## 2015-02-09 MED ORDER — DONEPEZIL HCL 5 MG PO TABS: ORAL_TABLET | ORAL | Status: DC

## 2015-02-09 MED ORDER — BACLOFEN 10 MG PO TABS: ORAL_TABLET | ORAL | Status: DC

## 2015-02-09 MED ORDER — PREGABALIN 75 MG PO CAPS: 75.0000 mg | ORAL_CAPSULE | Freq: Two times a day (BID) | ORAL | Status: DC

## 2015-02-09 NOTE — Progress Notes (Signed)
I have read the note, and I agree with the clinical assessment and plan.  Eliaz Fout KEITH   

## 2015-02-09 NOTE — Progress Notes (Signed)
[facility]  PATIENT: [name] DOB: October 01, 1946   REASON FOR VISIT: Follow up for MS memory loss, trigeminal neuralgia , gait abnormality HISTORY FROM: Patient, son  HISTORY OF PRESENT ILLNESS:[name] is a 69 year old right-handed black female with a history of multiple sclerosis. She was last seen by Dr. Jannifer Franklin 12/10/2014 on an urgent basis for a flare of her trigeminal neuralgia. Lyrica was added at that time. She  had issues on the right side of the face previously but her last bout was with the left side .She has continued to progress with her multiple sclerosis, and she is essentially wheelchair-bound at this point. She is able to stand and help with transfers. She has not had any falls since last seen. She is on Avonex, tolerating the medication well. The patient is back  to eating  solid foods. The patient is on carbamazepine taking 200 mg 4 times daily, the son  increased her dose. She has received gamma knife treatment previously for her trigeminal neuralgia with initially good benefit. 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 She returns for reevaluation and labs   REVIEW OF SYSTEMS: Full 14 system review of systems performed and notable only for those listed, all others are neg:  Constitutional: Fatigue  Cardiovascular: neg Ear/Nose/Throat: neg  Skin: neg Eyes: neg Respiratory: neg Gastroitestinal: Constipation Hematology/Lymphatic: neg  Endocrine: Intolerance to cold Musculoskeletal:neg Allergy/Immunology: neg Neurological: neg Psychiatric: Decreased concentration Sleep : neg   ALLERGIES: Allergies  Allergen Reactions  . Shellfish Allergy     Hives     HOME MEDICATIONS: Outpatient Prescriptions Prior to Visit  Medication Sig Dispense Refill  . acetaminophen (TYLENOL) 500 MG tablet Take 500 mg by mouth as  needed.    . ALPRAZolam (XANAX) 0.25 MG tablet Take 0.25 mg by mouth at bedtime as needed. For anxiety one half a pill.    Marland Kitchen amLODipine (NORVASC) 10 MG tablet Take 1 tablet (10 mg total) by mouth daily. 30 tablet 3  . atorvastatin (LIPITOR) 40 MG tablet Take 40 mg by mouth daily.      . AVONEX PEN 30 MCG/0.5ML AJKT Inject 30 mcg Intramuscularly once a week Rotate injection site 1 each 6  . baclofen (LIORESAL) 10 MG tablet TAKE ONE TABLET TWICE DAILY AND TWO AT NIGHT 120 tablet 5  . buPROPion (WELLBUTRIN XL) 300 MG 24 hr tablet Take 300 mg by mouth daily.    . carbamazepine (TEGRETOL) 200 MG tablet TAKE 1 TABLET (200 MG TOTAL) BY MOUTH 3 (THREE) TIMES DAILY. (Patient taking differently: TAKE 1 TABLET (200 MG TOTAL) BY MOUTH 4 times daily) 270 tablet 1  . Cholecalciferol ([facility] VIT D 5000 HIGH-POTENCY PO) Take 5,000 Int'l Units/L by mouth daily.    Marland Kitchen donepezil (ARICEPT) 5 MG tablet TAKE 1 TABLET (5 MG TOTAL) BY MOUTH DAILY. 90 tablet 1  . FIBER SELECT GUMMIES PO Take 5 g by mouth 2 (two) times daily.     . fish oil-omega-3 fatty acids 1000 MG capsule Take 2 g by mouth 3 (three) times daily.    Marland Kitchen HYDROcodone-acetaminophen (VICODIN) 5-500 MG per tablet Take 1 tablet by mouth every 6 (six) hours as needed for pain.    Marland Kitchen labetalol (NORMODYNE) 100 MG tablet Take 100 mg by mouth 2 (two) times daily.    Marland Kitchen losartan (COZAAR) 100 MG tablet Take 100 mg by  mouth daily.    . Multiple Vitamins-Minerals (MULTIVITAMINS THER. W/MINERALS) TABS Take 1 tablet by mouth daily.      . potassium chloride SA (K-DUR,KLOR-CON) 20 MEQ tablet Take 20 mEq by mouth 3 (three) times daily.      . pregabalin (LYRICA) 75 MG capsule Take 1 capsule (75 mg total) by mouth 2 (two) times daily. 60 capsule 3  . sertraline (ZOLOFT) 50 MG tablet Take 50 mg by mouth daily. One and one half tablet once a day in morning.    . tolterodine (DETROL LA) 2 MG 24 hr capsule TAKE 1 CAPSULE BY MOUTH AT BEDTIME 90 capsule 1  . traMADol (ULTRAM) 50 MG  tablet TAKE 1 TABLET BY MOUTH EVERY 4 TO 6 HOURS AS NEEDED MUST LAST 28 DAYS 150 tablet 3  . losartan-hydrochlorothiazide (HYZAAR) 100-12.5 MG per tablet     . carbamazepine (TEGRETOL) 200 MG tablet Take 1 tablet (200 mg total) by mouth 3 (three) times daily. 15 tablet 0  . dexamethasone (DECADRON) 0.5 MG tablet Begin taking 6 tablets daily, taper by one tablet daily until off the medication. 21 tablet 0  . interferon beta-1a (AVONEX PEN) 30 MCG/0.5ML injection Inject 30 mcg Intramuscularly once a week (every 7 days) Rotate injection site 1 kit 6   No facility-administered medications prior to visit.    PAST MEDICAL HISTORY: Past Medical History  Diagnosis Date  . Multiple sclerosis     Right sided weakness and gait disorder, bladder and bowel problems   . Hypertension   . Depression   . 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 Trigeminal neuralgia   . Dyslipidemia   . Fibroids   . Overdose of muscle relaxant     Unintentional baclofen overdose   . Trigeminal neuralgia     Right  . Abnormality of gait   . DVT (deep venous thrombosis)     Left leg  . Neurogenic bladder   . Memory difficulties 12/10/2014    PAST SURGICAL HISTORY: Past Surgical History  Procedure Laterality Date  . Strabismus surgery      Right eye   . Gamma knife procedure for rt. trigeminal neuralgia Right   . Left leg dvt Left     FAMILY HISTORY: Family History  Problem Relation Age of Onset  . Hypertension    . Heart disease    . Uterine cancer    . Multiple sclerosis Sister   . Cervical cancer Mother   . Liver disease Father   . Hyperlipidemia Brother   . Pancreatitis Brother     SOCIAL HISTORY: History   Social History  . Marital Status: Divorced    Spouse Name: N/A  . Number of Children: 2  . Years of Education: 16   Occupational History  . disabled school teacher    Social History Main Topics  . Smoking status: Never Smoker   . Smokeless tobacco: Never Used  . Alcohol Use: No    . Drug Use: No  . Sexual Activity: No   Other Topics Concern  . Not on file   Social History Narrative   Divorced, has a son and daughter. [name] is very involved in her care. [phone] and [phone]   Living with her son, [name] as FT caregiver.  02-09-15 ssy     PHYSICAL EXAM  Filed Vitals:   02/09/15 1422  BP: 137/69  Pulse: 70   There is no weight on file to  calculate BMI. General: The patient is alert and cooperative at the time of the examination. Skin: No significant peripheral edema is noted. The patient is wearing bilateral AFO braces.   Neurologic Exam  Mental status: The patient is oriented x 3.MMSE 20/30. AFT 4,Clock drawing 0/4. Cranial nerves: Facial symmetry is present. Speech is normal, no aphasia or dysarthria is noted. PERL.Extraocular movements are full. Visual fields are full. Motor: The patient has good strength in the upper  extremities, 4 out of 5 in both lower extremities. Sensory examination: Soft touch sensation is symmetric on the face, arms, and legs. Coordination: The patient has good finger-nose-finger bilaterally. The patient is unable to perform heel-to-shin on either side. Gait and station: The gait was attempted. The patient is not able to fully stand upright with assistance. She has not effectively able to ambulate. She is in a wheelchair Reflexes: Deep tendon reflexes are symmetric.   DIAGNOSTIC DATA (LABS, IMAGING, TESTING) -   ASSESSMENT AND PLAN  69 y.o. year old female  has a past medical history of Multiple sclerosis; trigeminal neuralgia, memory loss and gait disorder  Continue carbamazepine for trigeminal neuralgia at current dose check level of drug then refill Continue Lyrica for trigeminal neuralgia will refill Will also check CBC and CMP Continue Avonex weekly Continue baclofen for spasticity will refill Continue Aricept for memory will refill Follow-up in 6 months Dennie Bible, [facility], [facility], [facility] [address] [place], [place] [address] [phone]

## 2015-02-09 NOTE — Patient Instructions (Signed)
Continue carbamazepine for trigeminal neuralgia at current dose check level of drug Continue Lyrica for trigeminal neuralgia Will also check CBC and CMP Continue Avonex weekly Continue Aricept for memory Follow-up in 6 months

## 2015-02-10 ENCOUNTER — Telehealth: Payer: Self-pay | Admitting: Nurse Practitioner

## 2015-02-10 LAB — CBC WITH DIFFERENTIAL/PLATELET
Basophils Absolute: 0 10*3/uL (ref 0.0–0.2)
Basos: 0 %
EOS (ABSOLUTE): 0 10*3/uL (ref 0.0–0.4)
Eos: 0 %
Hematocrit: 36.2 % (ref 34.0–46.6)
Hemoglobin: 12.4 g/dL (ref 11.1–15.9)
IMMATURE GRANS (ABS): 0 10*3/uL (ref 0.0–0.1)
Immature Granulocytes: 0 %
Lymphocytes Absolute: 1.6 10*3/uL (ref 0.7–3.1)
Lymphs: 32 %
MCH: 31.2 pg (ref 26.6–33.0)
MCHC: 34.3 g/dL (ref 31.5–35.7)
MCV: 91 fL (ref 79–97)
Monocytes Absolute: 0.6 10*3/uL (ref 0.1–0.9)
Monocytes: 12 %
NEUTROS PCT: 56 %
Neutrophils Absolute: 2.7 10*3/uL (ref 1.4–7.0)
Platelets: 235 10*3/uL (ref 150–379)
RBC: 3.97 x10E6/uL (ref 3.77–5.28)
RDW: 14.1 % (ref 12.3–15.4)
WBC: 4.9 10*3/uL (ref 3.4–10.8)

## 2015-02-10 LAB — COMPREHENSIVE METABOLIC PANEL
A/G RATIO: 1.6 (ref 1.1–2.5)
ALBUMIN: 4.2 g/dL (ref 3.6–4.8)
ALK PHOS: 106 IU/L (ref 39–117)
ALT: 17 IU/L (ref 0–32)
AST: 20 IU/L (ref 0–40)
BUN / CREAT RATIO: 18 (ref 11–26)
BUN: 12 mg/dL (ref 8–27)
CO2: 24 mmol/L (ref 18–29)
Calcium: 9.4 mg/dL (ref 8.7–10.3)
Chloride: 98 mmol/L (ref 97–108)
Creatinine, Ser: 0.68 mg/dL (ref 0.57–1.00)
GFR calc Af Amer: 105 mL/min/{1.73_m2} (ref >59)
GFR, EST NON AFRICAN AMERICAN: 91 mL/min/{1.73_m2} (ref >59)
GLUCOSE: 86 mg/dL (ref 65–99)
Globulin, Total: 2.6 g/dL (ref 1.5–4.5)
Potassium: 4.2 mmol/L (ref 3.5–5.2)
Sodium: 137 mmol/L (ref 134–144)
TOTAL PROTEIN: 6.8 g/dL (ref 6.0–8.5)

## 2015-02-10 LAB — CARBAMAZEPINE LEVEL, TOTAL: Carbamazepine Lvl: 11.5 ug/mL (ref 4.0–12.0)

## 2015-02-10 MED ORDER — CARBAMAZEPINE 200 MG PO TABS: ORAL_TABLET | ORAL | Status: DC

## 2015-02-10 NOTE — Telephone Encounter (Signed)
Telephone call to son, carbamazepine level 11.5. She will cut back to 3.5  Tablets daily taking 200mg  TID and 100mg  hs. She is also on Lyrica. Will refill medication

## 2015-03-12 ENCOUNTER — Telehealth: Payer: Self-pay | Admitting: Neurology

## 2015-03-12 MED ORDER — INTERFERON BETA-1A 30 MCG/0.5ML IM AJKT: 30.0000 ug | AUTO-INJECTOR | INTRAMUSCULAR | Status: DC

## 2015-03-12 NOTE — Telephone Encounter (Signed)
Rx has been sent.  Receipt confirmed by pharmacy.   

## 2015-03-12 NOTE — Telephone Encounter (Signed)
Patient's son is calling. The patient needs a new Rx for AVONEX PEN 30 MCG/0.5ML [facility]KT [facility] [address]'s [facility] with her insurance. Thank you.

## 2015-03-17 ENCOUNTER — Other Ambulatory Visit: Payer: Self-pay | Admitting: Neurology

## 2015-03-30 ENCOUNTER — Telehealth: Payer: Self-pay | Admitting: Neurology

## 2015-03-30 MED ORDER — INTERFERON BETA-1A 30 MCG/0.5ML IM AJKT: 30.0000 ug | AUTO-INJECTOR | INTRAMUSCULAR | Status: DC

## 2015-03-30 NOTE — Telephone Encounter (Signed)
[facility] was [facility] on [facility] per patient request.  I called the number provided.  Spoke with Otis Peak, she said this was not the provider line, it was for customer service, and a verbal Rx could not be phoned in on this line.  She asked that we send Rx electronically to [facility] with fax listed as [phone].  [facility]  Receipt confirmed by pharmacy.  I called back to advise.    They are aware.

## 2015-03-30 NOTE — Telephone Encounter (Signed)
Patient's son [name]) called requesting refill for Interferon Beta-1a (AVONEX PEN) 30 MCG/0.5ML AJKT . Please call [facility] @ [phone]. He can be reached at [phone].

## 2015-04-01 DIAGNOSIS — G35 Multiple sclerosis: Secondary | ICD-10-CM | POA: Diagnosis not present

## 2015-04-01 DIAGNOSIS — E780 Pure hypercholesterolemia: Secondary | ICD-10-CM | POA: Diagnosis not present

## 2015-04-01 DIAGNOSIS — I10 Essential (primary) hypertension: Secondary | ICD-10-CM | POA: Diagnosis not present

## 2015-04-01 DIAGNOSIS — F419 Anxiety disorder, unspecified: Secondary | ICD-10-CM | POA: Diagnosis not present

## 2015-04-05 DIAGNOSIS — F09 Unspecified mental disorder due to known physiological condition: Secondary | ICD-10-CM | POA: Diagnosis not present

## 2015-04-05 DIAGNOSIS — L89312 Pressure ulcer of right buttock, stage 2: Secondary | ICD-10-CM | POA: Diagnosis not present

## 2015-04-05 DIAGNOSIS — G35 Multiple sclerosis: Secondary | ICD-10-CM | POA: Diagnosis not present

## 2015-04-05 DIAGNOSIS — G500 Trigeminal neuralgia: Secondary | ICD-10-CM | POA: Diagnosis not present

## 2015-04-05 DIAGNOSIS — L89322 Pressure ulcer of left buttock, stage 2: Secondary | ICD-10-CM | POA: Diagnosis not present

## 2015-04-05 DIAGNOSIS — G629 Polyneuropathy, unspecified: Secondary | ICD-10-CM | POA: Diagnosis not present

## 2015-04-15 DIAGNOSIS — F09 Unspecified mental disorder due to known physiological condition: Secondary | ICD-10-CM | POA: Diagnosis not present

## 2015-04-15 DIAGNOSIS — L89312 Pressure ulcer of right buttock, stage 2: Secondary | ICD-10-CM | POA: Diagnosis not present

## 2015-04-15 DIAGNOSIS — G629 Polyneuropathy, unspecified: Secondary | ICD-10-CM | POA: Diagnosis not present

## 2015-04-15 DIAGNOSIS — G500 Trigeminal neuralgia: Secondary | ICD-10-CM | POA: Diagnosis not present

## 2015-04-15 DIAGNOSIS — L89322 Pressure ulcer of left buttock, stage 2: Secondary | ICD-10-CM | POA: Diagnosis not present

## 2015-04-15 DIAGNOSIS — G35 Multiple sclerosis: Secondary | ICD-10-CM | POA: Diagnosis not present

## 2015-04-18 DIAGNOSIS — G35 Multiple sclerosis: Secondary | ICD-10-CM | POA: Diagnosis not present

## 2015-04-18 DIAGNOSIS — G629 Polyneuropathy, unspecified: Secondary | ICD-10-CM | POA: Diagnosis not present

## 2015-04-18 DIAGNOSIS — F09 Unspecified mental disorder due to known physiological condition: Secondary | ICD-10-CM | POA: Diagnosis not present

## 2015-04-18 DIAGNOSIS — L89312 Pressure ulcer of right buttock, stage 2: Secondary | ICD-10-CM | POA: Diagnosis not present

## 2015-04-18 DIAGNOSIS — G500 Trigeminal neuralgia: Secondary | ICD-10-CM | POA: Diagnosis not present

## 2015-04-18 DIAGNOSIS — L89322 Pressure ulcer of left buttock, stage 2: Secondary | ICD-10-CM | POA: Diagnosis not present

## 2015-04-20 DIAGNOSIS — F09 Unspecified mental disorder due to known physiological condition: Secondary | ICD-10-CM | POA: Diagnosis not present

## 2015-04-20 DIAGNOSIS — G500 Trigeminal neuralgia: Secondary | ICD-10-CM | POA: Diagnosis not present

## 2015-04-20 DIAGNOSIS — G629 Polyneuropathy, unspecified: Secondary | ICD-10-CM | POA: Diagnosis not present

## 2015-04-20 DIAGNOSIS — G35 Multiple sclerosis: Secondary | ICD-10-CM | POA: Diagnosis not present

## 2015-04-20 DIAGNOSIS — L89322 Pressure ulcer of left buttock, stage 2: Secondary | ICD-10-CM | POA: Diagnosis not present

## 2015-04-20 DIAGNOSIS — L89312 Pressure ulcer of right buttock, stage 2: Secondary | ICD-10-CM | POA: Diagnosis not present

## 2015-04-21 DIAGNOSIS — G35 Multiple sclerosis: Secondary | ICD-10-CM | POA: Diagnosis not present

## 2015-04-21 DIAGNOSIS — G500 Trigeminal neuralgia: Secondary | ICD-10-CM | POA: Diagnosis not present

## 2015-04-21 DIAGNOSIS — L89312 Pressure ulcer of right buttock, stage 2: Secondary | ICD-10-CM | POA: Diagnosis not present

## 2015-04-21 DIAGNOSIS — G629 Polyneuropathy, unspecified: Secondary | ICD-10-CM | POA: Diagnosis not present

## 2015-04-21 DIAGNOSIS — L89322 Pressure ulcer of left buttock, stage 2: Secondary | ICD-10-CM | POA: Diagnosis not present

## 2015-04-21 DIAGNOSIS — F09 Unspecified mental disorder due to known physiological condition: Secondary | ICD-10-CM | POA: Diagnosis not present

## 2015-04-22 DIAGNOSIS — G500 Trigeminal neuralgia: Secondary | ICD-10-CM | POA: Diagnosis not present

## 2015-04-22 DIAGNOSIS — L89322 Pressure ulcer of left buttock, stage 2: Secondary | ICD-10-CM | POA: Diagnosis not present

## 2015-04-22 DIAGNOSIS — F09 Unspecified mental disorder due to known physiological condition: Secondary | ICD-10-CM | POA: Diagnosis not present

## 2015-04-22 DIAGNOSIS — G35 Multiple sclerosis: Secondary | ICD-10-CM | POA: Diagnosis not present

## 2015-04-22 DIAGNOSIS — G629 Polyneuropathy, unspecified: Secondary | ICD-10-CM | POA: Diagnosis not present

## 2015-04-22 DIAGNOSIS — L89312 Pressure ulcer of right buttock, stage 2: Secondary | ICD-10-CM | POA: Diagnosis not present

## 2015-04-28 DIAGNOSIS — L89312 Pressure ulcer of right buttock, stage 2: Secondary | ICD-10-CM | POA: Diagnosis not present

## 2015-04-28 DIAGNOSIS — F09 Unspecified mental disorder due to known physiological condition: Secondary | ICD-10-CM | POA: Diagnosis not present

## 2015-04-28 DIAGNOSIS — G629 Polyneuropathy, unspecified: Secondary | ICD-10-CM | POA: Diagnosis not present

## 2015-04-28 DIAGNOSIS — G35 Multiple sclerosis: Secondary | ICD-10-CM | POA: Diagnosis not present

## 2015-04-28 DIAGNOSIS — G500 Trigeminal neuralgia: Secondary | ICD-10-CM | POA: Diagnosis not present

## 2015-04-28 DIAGNOSIS — L89322 Pressure ulcer of left buttock, stage 2: Secondary | ICD-10-CM | POA: Diagnosis not present

## 2015-05-05 ENCOUNTER — Other Ambulatory Visit: Payer: Self-pay | Admitting: Neurology

## 2015-05-06 NOTE — Telephone Encounter (Signed)
Rx signed and faxed.

## 2015-05-09 DIAGNOSIS — L89322 Pressure ulcer of left buttock, stage 2: Secondary | ICD-10-CM | POA: Diagnosis not present

## 2015-05-09 DIAGNOSIS — F09 Unspecified mental disorder due to known physiological condition: Secondary | ICD-10-CM | POA: Diagnosis not present

## 2015-05-09 DIAGNOSIS — G35 Multiple sclerosis: Secondary | ICD-10-CM | POA: Diagnosis not present

## 2015-05-09 DIAGNOSIS — G500 Trigeminal neuralgia: Secondary | ICD-10-CM | POA: Diagnosis not present

## 2015-05-09 DIAGNOSIS — G629 Polyneuropathy, unspecified: Secondary | ICD-10-CM | POA: Diagnosis not present

## 2015-05-09 DIAGNOSIS — L89312 Pressure ulcer of right buttock, stage 2: Secondary | ICD-10-CM | POA: Diagnosis not present

## 2015-05-12 DIAGNOSIS — G629 Polyneuropathy, unspecified: Secondary | ICD-10-CM | POA: Diagnosis not present

## 2015-05-12 DIAGNOSIS — G35 Multiple sclerosis: Secondary | ICD-10-CM | POA: Diagnosis not present

## 2015-05-12 DIAGNOSIS — F09 Unspecified mental disorder due to known physiological condition: Secondary | ICD-10-CM | POA: Diagnosis not present

## 2015-05-12 DIAGNOSIS — L89322 Pressure ulcer of left buttock, stage 2: Secondary | ICD-10-CM | POA: Diagnosis not present

## 2015-05-12 DIAGNOSIS — L89312 Pressure ulcer of right buttock, stage 2: Secondary | ICD-10-CM | POA: Diagnosis not present

## 2015-05-12 DIAGNOSIS — G500 Trigeminal neuralgia: Secondary | ICD-10-CM | POA: Diagnosis not present

## 2015-05-17 DIAGNOSIS — F09 Unspecified mental disorder due to known physiological condition: Secondary | ICD-10-CM | POA: Diagnosis not present

## 2015-05-17 DIAGNOSIS — L89322 Pressure ulcer of left buttock, stage 2: Secondary | ICD-10-CM | POA: Diagnosis not present

## 2015-05-17 DIAGNOSIS — G629 Polyneuropathy, unspecified: Secondary | ICD-10-CM | POA: Diagnosis not present

## 2015-05-17 DIAGNOSIS — L89312 Pressure ulcer of right buttock, stage 2: Secondary | ICD-10-CM | POA: Diagnosis not present

## 2015-05-17 DIAGNOSIS — G500 Trigeminal neuralgia: Secondary | ICD-10-CM | POA: Diagnosis not present

## 2015-05-17 DIAGNOSIS — G35 Multiple sclerosis: Secondary | ICD-10-CM | POA: Diagnosis not present

## 2015-05-20 DIAGNOSIS — G629 Polyneuropathy, unspecified: Secondary | ICD-10-CM | POA: Diagnosis not present

## 2015-05-20 DIAGNOSIS — L89312 Pressure ulcer of right buttock, stage 2: Secondary | ICD-10-CM | POA: Diagnosis not present

## 2015-05-20 DIAGNOSIS — F09 Unspecified mental disorder due to known physiological condition: Secondary | ICD-10-CM | POA: Diagnosis not present

## 2015-05-20 DIAGNOSIS — L89322 Pressure ulcer of left buttock, stage 2: Secondary | ICD-10-CM | POA: Diagnosis not present

## 2015-05-20 DIAGNOSIS — G500 Trigeminal neuralgia: Secondary | ICD-10-CM | POA: Diagnosis not present

## 2015-05-20 DIAGNOSIS — G35 Multiple sclerosis: Secondary | ICD-10-CM | POA: Diagnosis not present

## 2015-05-24 DIAGNOSIS — G500 Trigeminal neuralgia: Secondary | ICD-10-CM | POA: Diagnosis not present

## 2015-05-24 DIAGNOSIS — G629 Polyneuropathy, unspecified: Secondary | ICD-10-CM | POA: Diagnosis not present

## 2015-05-24 DIAGNOSIS — F09 Unspecified mental disorder due to known physiological condition: Secondary | ICD-10-CM | POA: Diagnosis not present

## 2015-05-24 DIAGNOSIS — L89322 Pressure ulcer of left buttock, stage 2: Secondary | ICD-10-CM | POA: Diagnosis not present

## 2015-05-24 DIAGNOSIS — L89312 Pressure ulcer of right buttock, stage 2: Secondary | ICD-10-CM | POA: Diagnosis not present

## 2015-05-24 DIAGNOSIS — G35 Multiple sclerosis: Secondary | ICD-10-CM | POA: Diagnosis not present

## 2015-05-26 DIAGNOSIS — G35 Multiple sclerosis: Secondary | ICD-10-CM | POA: Diagnosis not present

## 2015-05-26 DIAGNOSIS — L89312 Pressure ulcer of right buttock, stage 2: Secondary | ICD-10-CM | POA: Diagnosis not present

## 2015-05-26 DIAGNOSIS — G500 Trigeminal neuralgia: Secondary | ICD-10-CM | POA: Diagnosis not present

## 2015-05-26 DIAGNOSIS — L89322 Pressure ulcer of left buttock, stage 2: Secondary | ICD-10-CM | POA: Diagnosis not present

## 2015-05-26 DIAGNOSIS — G629 Polyneuropathy, unspecified: Secondary | ICD-10-CM | POA: Diagnosis not present

## 2015-05-26 DIAGNOSIS — F09 Unspecified mental disorder due to known physiological condition: Secondary | ICD-10-CM | POA: Diagnosis not present

## 2015-06-02 DIAGNOSIS — F09 Unspecified mental disorder due to known physiological condition: Secondary | ICD-10-CM | POA: Diagnosis not present

## 2015-06-02 DIAGNOSIS — G500 Trigeminal neuralgia: Secondary | ICD-10-CM | POA: Diagnosis not present

## 2015-06-02 DIAGNOSIS — G629 Polyneuropathy, unspecified: Secondary | ICD-10-CM | POA: Diagnosis not present

## 2015-06-02 DIAGNOSIS — L89322 Pressure ulcer of left buttock, stage 2: Secondary | ICD-10-CM | POA: Diagnosis not present

## 2015-06-02 DIAGNOSIS — G35 Multiple sclerosis: Secondary | ICD-10-CM | POA: Diagnosis not present

## 2015-06-02 DIAGNOSIS — L89312 Pressure ulcer of right buttock, stage 2: Secondary | ICD-10-CM | POA: Diagnosis not present

## 2015-06-04 DIAGNOSIS — F09 Unspecified mental disorder due to known physiological condition: Secondary | ICD-10-CM | POA: Diagnosis not present

## 2015-06-04 DIAGNOSIS — L89312 Pressure ulcer of right buttock, stage 2: Secondary | ICD-10-CM | POA: Diagnosis not present

## 2015-06-04 DIAGNOSIS — G35 Multiple sclerosis: Secondary | ICD-10-CM | POA: Diagnosis not present

## 2015-06-04 DIAGNOSIS — G500 Trigeminal neuralgia: Secondary | ICD-10-CM | POA: Diagnosis not present

## 2015-06-04 DIAGNOSIS — L89322 Pressure ulcer of left buttock, stage 2: Secondary | ICD-10-CM | POA: Diagnosis not present

## 2015-06-04 DIAGNOSIS — G629 Polyneuropathy, unspecified: Secondary | ICD-10-CM | POA: Diagnosis not present

## 2015-06-16 DIAGNOSIS — L89322 Pressure ulcer of left buttock, stage 2: Secondary | ICD-10-CM | POA: Diagnosis not present

## 2015-06-16 DIAGNOSIS — G500 Trigeminal neuralgia: Secondary | ICD-10-CM | POA: Diagnosis not present

## 2015-06-16 DIAGNOSIS — G629 Polyneuropathy, unspecified: Secondary | ICD-10-CM | POA: Diagnosis not present

## 2015-06-16 DIAGNOSIS — G35 Multiple sclerosis: Secondary | ICD-10-CM | POA: Diagnosis not present

## 2015-06-16 DIAGNOSIS — F09 Unspecified mental disorder due to known physiological condition: Secondary | ICD-10-CM | POA: Diagnosis not present

## 2015-06-16 DIAGNOSIS — L89312 Pressure ulcer of right buttock, stage 2: Secondary | ICD-10-CM | POA: Diagnosis not present

## 2015-06-20 DIAGNOSIS — G629 Polyneuropathy, unspecified: Secondary | ICD-10-CM | POA: Diagnosis not present

## 2015-06-20 DIAGNOSIS — L89322 Pressure ulcer of left buttock, stage 2: Secondary | ICD-10-CM | POA: Diagnosis not present

## 2015-06-20 DIAGNOSIS — L89312 Pressure ulcer of right buttock, stage 2: Secondary | ICD-10-CM | POA: Diagnosis not present

## 2015-06-20 DIAGNOSIS — G500 Trigeminal neuralgia: Secondary | ICD-10-CM | POA: Diagnosis not present

## 2015-06-20 DIAGNOSIS — F09 Unspecified mental disorder due to known physiological condition: Secondary | ICD-10-CM | POA: Diagnosis not present

## 2015-06-20 DIAGNOSIS — G35 Multiple sclerosis: Secondary | ICD-10-CM | POA: Diagnosis not present

## 2015-06-24 ENCOUNTER — Telehealth: Payer: Self-pay | Admitting: Neurology

## 2015-06-24 MED ORDER — INTERFERON BETA-1A 30 MCG/0.5ML IM AJKT: 30.0000 ug | AUTO-INJECTOR | INTRAMUSCULAR | Status: DC

## 2015-06-24 NOTE — Telephone Encounter (Signed)
Patients son called stating Interferon Beta-1a (AVONEX PEN) 30 MCG/0.5ML AJKT refill needs to be sent to [place] phone # [phone]. He states patient is out of medication. Please call and advise him when this is complete. He can be reached at [phone].

## 2015-06-24 NOTE — Telephone Encounter (Signed)
[facility] has been added to chart.  Rx has been provided.  Receipt confirmed by pharmacy.  I called back to advise.  They expressed understanding and appreciation.

## 2015-06-26 ENCOUNTER — Other Ambulatory Visit: Payer: Self-pay

## 2015-06-26 ENCOUNTER — Telehealth: Payer: Self-pay | Admitting: Neurology

## 2015-06-26 ENCOUNTER — Telehealth: Payer: Self-pay

## 2015-06-26 MED ORDER — INTERFERON BETA-1A 30 MCG/0.5ML IM AJKT: 30.0000 ug | AUTO-INJECTOR | INTRAMUSCULAR | Status: DC

## 2015-06-26 NOTE — Telephone Encounter (Signed)
I called back and spoke with Mr Tranchina.  Relayed providers message.  He expressed understanding.  Says since the pharmacy keeps changing, he had to provide updated billing info, and they are processing that at this time.  He will call us back if anything further is needed.  They also have the info to contact [employer] directly should there be any further delays, as in most cases they can ship a temp supply to the patient.

## 2015-06-26 NOTE — Telephone Encounter (Signed)
Son is inquiring if the patient should proceed with Avonex injection when it arrives, or if they need to wait until next Wed.  (She usually does her injections on Wed, however, [facility], which has delayed shipment.)  I called [employer], and they indicated the decision is ultimately up to the provider, however they do recommend injecting when order arrives.  They stated if the patient would like to wait until next Wed and stay on the same schedule, she may be more likely to develop flu-like symptoms with that initial injection.  Dr Jannifer Franklin is out of the office.  Forwarding message to [facility] to see if they are agreeable to this plan, or if something different is recommended.   Please advise.  Thank you.

## 2015-06-26 NOTE — Telephone Encounter (Signed)
Patients son called stating RX for  Interferon Beta-1a (AVONEX PEN) 30 MCG/0.5ML AJKT has been bounced around from [facility] to [facility] last night. Patient has been out of medication this week. He doesn't know when medication will come and inquiring if she can get injection in office today. Please call and advise.I skyped Verneita Griffes and she said I would need to take message. Faith was starting IV fusion. He can be reached at [phone]

## 2015-06-26 NOTE — Telephone Encounter (Signed)
The supplier for the Avonex prescription has been switched around, currently coming through [facility]. The patient has missed a dose of Avonex this last week, this should not pose any major problems, they are to restart next week.

## 2015-06-26 NOTE — Telephone Encounter (Signed)
Stay on weekly schedule. At least 7 days apart.

## 2015-06-27 DIAGNOSIS — L89322 Pressure ulcer of left buttock, stage 2: Secondary | ICD-10-CM | POA: Diagnosis not present

## 2015-06-27 DIAGNOSIS — F09 Unspecified mental disorder due to known physiological condition: Secondary | ICD-10-CM | POA: Diagnosis not present

## 2015-06-27 DIAGNOSIS — L89312 Pressure ulcer of right buttock, stage 2: Secondary | ICD-10-CM | POA: Diagnosis not present

## 2015-06-27 DIAGNOSIS — G35 Multiple sclerosis: Secondary | ICD-10-CM | POA: Diagnosis not present

## 2015-06-27 DIAGNOSIS — G629 Polyneuropathy, unspecified: Secondary | ICD-10-CM | POA: Diagnosis not present

## 2015-06-27 DIAGNOSIS — G500 Trigeminal neuralgia: Secondary | ICD-10-CM | POA: Diagnosis not present

## 2015-06-30 DIAGNOSIS — F09 Unspecified mental disorder due to known physiological condition: Secondary | ICD-10-CM | POA: Diagnosis not present

## 2015-06-30 DIAGNOSIS — G629 Polyneuropathy, unspecified: Secondary | ICD-10-CM | POA: Diagnosis not present

## 2015-06-30 DIAGNOSIS — L89322 Pressure ulcer of left buttock, stage 2: Secondary | ICD-10-CM | POA: Diagnosis not present

## 2015-06-30 DIAGNOSIS — L89312 Pressure ulcer of right buttock, stage 2: Secondary | ICD-10-CM | POA: Diagnosis not present

## 2015-06-30 DIAGNOSIS — G500 Trigeminal neuralgia: Secondary | ICD-10-CM | POA: Diagnosis not present

## 2015-06-30 DIAGNOSIS — G35 Multiple sclerosis: Secondary | ICD-10-CM | POA: Diagnosis not present

## 2015-07-04 DIAGNOSIS — G500 Trigeminal neuralgia: Secondary | ICD-10-CM | POA: Diagnosis not present

## 2015-07-04 DIAGNOSIS — L89312 Pressure ulcer of right buttock, stage 2: Secondary | ICD-10-CM | POA: Diagnosis not present

## 2015-07-04 DIAGNOSIS — G629 Polyneuropathy, unspecified: Secondary | ICD-10-CM | POA: Diagnosis not present

## 2015-07-04 DIAGNOSIS — L89322 Pressure ulcer of left buttock, stage 2: Secondary | ICD-10-CM | POA: Diagnosis not present

## 2015-07-04 DIAGNOSIS — G35 Multiple sclerosis: Secondary | ICD-10-CM | POA: Diagnosis not present

## 2015-07-04 DIAGNOSIS — F09 Unspecified mental disorder due to known physiological condition: Secondary | ICD-10-CM | POA: Diagnosis not present

## 2015-07-07 ENCOUNTER — Other Ambulatory Visit: Payer: Self-pay | Admitting: Neurology

## 2015-07-08 ENCOUNTER — Other Ambulatory Visit: Payer: Self-pay | Admitting: Neurology

## 2015-07-09 MED ORDER — CARBAMAZEPINE 200 MG PO TABS: ORAL_TABLET | ORAL | Status: DC

## 2015-07-09 NOTE — Addendum Note (Signed)
Addended by: Margette Fast on: 07/09/2015 06:10 PM   Modules accepted: Orders

## 2015-07-09 NOTE — Telephone Encounter (Signed)
Patient's son is calling to get a refill called in for carbamazepine (TEGRETOL) 200 MG tablet for the patient. [facility] [address] [facility] and the son says the patient will be out of this medication on Saturday. Thank you.

## 2015-07-09 NOTE — Telephone Encounter (Signed)
I have refilled the carbamazepine prescription.

## 2015-07-11 DIAGNOSIS — G35 Multiple sclerosis: Secondary | ICD-10-CM | POA: Diagnosis not present

## 2015-07-11 DIAGNOSIS — L89312 Pressure ulcer of right buttock, stage 2: Secondary | ICD-10-CM | POA: Diagnosis not present

## 2015-07-11 DIAGNOSIS — G629 Polyneuropathy, unspecified: Secondary | ICD-10-CM | POA: Diagnosis not present

## 2015-07-11 DIAGNOSIS — L89322 Pressure ulcer of left buttock, stage 2: Secondary | ICD-10-CM | POA: Diagnosis not present

## 2015-07-11 DIAGNOSIS — G500 Trigeminal neuralgia: Secondary | ICD-10-CM | POA: Diagnosis not present

## 2015-07-11 DIAGNOSIS — F09 Unspecified mental disorder due to known physiological condition: Secondary | ICD-10-CM | POA: Diagnosis not present

## 2015-07-14 DIAGNOSIS — F09 Unspecified mental disorder due to known physiological condition: Secondary | ICD-10-CM | POA: Diagnosis not present

## 2015-07-14 DIAGNOSIS — L89312 Pressure ulcer of right buttock, stage 2: Secondary | ICD-10-CM | POA: Diagnosis not present

## 2015-07-14 DIAGNOSIS — L89322 Pressure ulcer of left buttock, stage 2: Secondary | ICD-10-CM | POA: Diagnosis not present

## 2015-07-14 DIAGNOSIS — G629 Polyneuropathy, unspecified: Secondary | ICD-10-CM | POA: Diagnosis not present

## 2015-07-14 DIAGNOSIS — G35 Multiple sclerosis: Secondary | ICD-10-CM | POA: Diagnosis not present

## 2015-07-14 DIAGNOSIS — G500 Trigeminal neuralgia: Secondary | ICD-10-CM | POA: Diagnosis not present

## 2015-07-21 ENCOUNTER — Telehealth: Payer: Self-pay | Admitting: Neurology

## 2015-07-21 MED ORDER — PREGABALIN 100 MG PO CAPS: 100.0000 mg | ORAL_CAPSULE | Freq: Two times a day (BID) | ORAL | Status: DC

## 2015-07-21 MED ORDER — PREDNISONE 5 MG PO TABS: ORAL_TABLET | ORAL | Status: DC

## 2015-07-21 NOTE — Telephone Encounter (Signed)
I called the patient's son. This is the same type of pain she has with her trigeminal neuralgia, but it has been more intense for the past 4 days. According to the patient's son, the Lyrica helped for a few months. He would like to know if there is anything else she can have to help with the pain.

## 2015-07-21 NOTE — Telephone Encounter (Signed)
I called the son. The patient had increased trigeminal neuralgia, mainly on the right side, but also on the left. This has increased over the last 4 days. I will give her prednisone Dosepak, and increase Lyrica taking 100 mg twice daily.

## 2015-07-21 NOTE — Telephone Encounter (Signed)
Pt's son called stating pt has had rt side pain in ear, inside the ear, side of face, jaw and teeth for 4 days. She has been taking Lyrica, he thinks it's helps some. She asked him to call as she is in severe pain. Please call and advise at [phone].

## 2015-07-25 DIAGNOSIS — L89312 Pressure ulcer of right buttock, stage 2: Secondary | ICD-10-CM | POA: Diagnosis not present

## 2015-07-25 DIAGNOSIS — F09 Unspecified mental disorder due to known physiological condition: Secondary | ICD-10-CM | POA: Diagnosis not present

## 2015-07-25 DIAGNOSIS — L89322 Pressure ulcer of left buttock, stage 2: Secondary | ICD-10-CM | POA: Diagnosis not present

## 2015-07-25 DIAGNOSIS — G35 Multiple sclerosis: Secondary | ICD-10-CM | POA: Diagnosis not present

## 2015-07-25 DIAGNOSIS — G629 Polyneuropathy, unspecified: Secondary | ICD-10-CM | POA: Diagnosis not present

## 2015-07-25 DIAGNOSIS — G500 Trigeminal neuralgia: Secondary | ICD-10-CM | POA: Diagnosis not present

## 2015-07-28 DIAGNOSIS — L89312 Pressure ulcer of right buttock, stage 2: Secondary | ICD-10-CM | POA: Diagnosis not present

## 2015-07-28 DIAGNOSIS — L89322 Pressure ulcer of left buttock, stage 2: Secondary | ICD-10-CM | POA: Diagnosis not present

## 2015-07-28 DIAGNOSIS — F09 Unspecified mental disorder due to known physiological condition: Secondary | ICD-10-CM | POA: Diagnosis not present

## 2015-07-28 DIAGNOSIS — G629 Polyneuropathy, unspecified: Secondary | ICD-10-CM | POA: Diagnosis not present

## 2015-07-28 DIAGNOSIS — G35 Multiple sclerosis: Secondary | ICD-10-CM | POA: Diagnosis not present

## 2015-07-28 DIAGNOSIS — G500 Trigeminal neuralgia: Secondary | ICD-10-CM | POA: Diagnosis not present

## 2015-08-11 ENCOUNTER — Ambulatory Visit (INDEPENDENT_AMBULATORY_CARE_PROVIDER_SITE_OTHER): Payer: Medicare Other | Admitting: Neurology

## 2015-08-11 ENCOUNTER — Encounter: Payer: Self-pay | Admitting: Neurology

## 2015-08-11 VITALS — BP 148/74 | HR 69 | Ht 64.0 in

## 2015-08-11 DIAGNOSIS — G500 Trigeminal neuralgia: Secondary | ICD-10-CM

## 2015-08-11 DIAGNOSIS — R413 Other amnesia: Secondary | ICD-10-CM | POA: Diagnosis not present

## 2015-08-11 DIAGNOSIS — G35 Multiple sclerosis: Secondary | ICD-10-CM

## 2015-08-11 NOTE — Progress Notes (Signed)
Reason for visit: Multiple sclerosis  [name] is an 69 y.o. female  History of present illness:  Ms. [name] is a 69 year old right-handed black female with a history of multiple sclerosis with end-stage features. The patient is on Avonex for her MS, she is tolerating this well. The patient has problems with severe trigeminal neuralgia, she has had a recent exacerbation of this. The patient has been placed on prednisone, and the Lyrica dose was increased. The patient has had some improvement in the pain, she is having about 4 twinges of pain daily, chewing may sometimes bring on the pain, and even blowing on her face may activate the pain. The patient is in general sleeping fairly well, she is able to eat and drink. The patient does have a health aid and assistance that comes in at times to help with her bathing and dressing. The son is the primary caretaker. The patient is nonambulatory. There have been no recent skin breakdown issues. The patient has not had any recent falls.  Past Medical History  Diagnosis Date  . Multiple sclerosis (HCC)     Right sided weakness and gait disorder, bladder and bowel problems   . Hypertension   . Depression   . 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 Trigeminal neuralgia   . Dyslipidemia   . Fibroids   . Overdose of muscle relaxant     Unintentional baclofen overdose   . Trigeminal neuralgia     Right  . Abnormality of gait   . DVT (deep venous thrombosis) (HCC)     Left leg  . Neurogenic bladder   . Memory difficulties 12/10/2014    Past Surgical History  Procedure Laterality Date  . Strabismus surgery      Right eye   . Gamma knife procedure for rt. trigeminal neuralgia Right   . Left leg dvt Left     Family History  Problem Relation Age of Onset  . Hypertension    . Heart disease    . Uterine cancer    . Multiple sclerosis Sister   . Cervical cancer Mother   . Liver disease Father   . Hyperlipidemia Brother   .  Pancreatitis Brother     Social history:  reports that she has never smoked. She has never used smokeless tobacco. She reports that she does not drink alcohol or use illicit drugs.    Allergies  Allergen Reactions  . Shellfish Allergy     Hives     Medications:  Prior to Admission medications   Medication Sig Start Date End Date Taking? Authorizing Provider  acetaminophen (TYLENOL) 500 MG tablet Take 500 mg by mouth as needed.   Yes Historical Provider, MD  ALPRAZolam Duanne Moron) 0.25 MG tablet Take 0.25 mg by mouth at bedtime as needed. For anxiety one half a pill. 11/03/11  Yes Deneise Lever, MD  amLODipine (NORVASC) 10 MG tablet Take 1 tablet (10 mg total) by mouth daily. 11/03/11  Yes Deneise Lever, MD  atorvastatin (LIPITOR) 40 MG tablet Take 40 mg by mouth daily.     Yes Historical Provider, MD  baclofen (LIORESAL) 10 MG tablet TAKE ONE TABLET TWICE DAILY AND TWO AT NIGHT 02/09/15  Yes Dennie Bible, NP  buPROPion (WELLBUTRIN XL) 300 MG 24 hr tablet Take 300 mg by mouth daily.   Yes Deneise Lever, MD  carbamazepine (TEGRETOL) 200 MG tablet 200mg  tid and 100 at hs  07/09/15  Yes Kathrynn Ducking, MD  Cholecalciferol ([facility] VIT D 5000 HIGH-POTENCY PO) Take 5,000 Int'l Units/L by mouth daily.   Yes Historical Provider, MD  donepezil (ARICEPT) 5 MG tablet TAKE 1 TABLET (5 MG TOTAL) BY MOUTH DAILY. 02/09/15  Yes Dennie Bible, NP  FIBER SELECT GUMMIES PO Take 5 g by mouth 2 (two) times daily.    Yes Historical Provider, MD  fish oil-omega-3 fatty acids 1000 MG capsule Take 2 g by mouth 3 (three) times daily.   Yes Deneise Lever, MD  HYDROcodone-acetaminophen (VICODIN) 5-500 MG per tablet Take 1 tablet by mouth every 6 (six) hours as needed for pain.   Yes Historical Provider, MD  Interferon Beta-1a (AVONEX PEN) 30 MCG/0.5ML AJKT Inject 30 mcg into the muscle every 7 (seven) days. 06/26/15  Yes Kathrynn Ducking, MD  labetalol (NORMODYNE) 100 MG tablet Take 100 mg by mouth 2 (two)  times daily.   Yes Deneise Lever, MD  losartan-hydrochlorothiazide [facility]) 100-12.5 MG per tablet  12/09/14  Yes Historical Provider, MD  Multiple Vitamins-Minerals (MULTIVITAMINS THER. W/MINERALS) TABS Take 1 tablet by mouth daily.     Yes Historical Provider, MD  potassium chloride SA (K-DUR,KLOR-CON) 20 MEQ tablet Take 20 mEq by mouth 3 (three) times daily.     Yes Historical Provider, MD  pregabalin (LYRICA) 100 MG capsule Take 1 capsule (100 mg total) by mouth 2 (two) times daily. 07/21/15  Yes Kathrynn Ducking, MD  sertraline (ZOLOFT) 50 MG tablet Take 50 mg by mouth daily. One and one half tablet once a day in morning.   Yes Historical Provider, MD  tolterodine (DETROL LA) 2 MG 24 hr capsule TAKE 1 CAPSULE BY MOUTH AT BEDTIME 03/17/15  Yes Kathrynn Ducking, MD  traMADol (ULTRAM) 50 MG tablet TAKE 1 TABLET BY MOUTH EVERY 4 TO 6 HOURS AS NEEDED MUST LAST 28 DAYS 12/31/14  Yes Kathrynn Ducking, MD    ROS:  Out of a complete 14 system review of symptoms, the patient complains only of the following symptoms, and all other reviewed systems are negative.  Fatigue Ear pain Eye pain Joint pain, achy muscles Headache, weakness  Blood pressure 148/74, pulse 69, height 5\' 4"  (1.626 m).  Physical Exam  General: The patient is alert and cooperative at the time of the examination.  Skin: 1-2+ edema below the knees is noted..   Neurologic Exam  Mental status: The patient is alert and oriented x 2 at the time of examination. The patient is cooperative.   Cranial nerves: Facial symmetry is present. Speech is normal, no aphasia or dysarthria is noted. Extraocular movements are full. Visual fields are full.  Motor: The patient has good strength in upper extremities. With the lower extremity is, there is 4/5 strength on the left, 3/5 strength on the right.  Sensory examination: Soft touch sensation is symmetric on the face, arms, and legs.  Coordination: The patient has slight ataxia with  finger-nose-finger bilaterally. The patient is unable to perform heel-to-shin on he decide.  Gait and station: The patient is unable to ambulate. With attempted standing, the patient will lean backwards.  Reflexes: Deep tendon reflexes are symmetric.   Assessment/Plan:  1. Multiple sclerosis  2. Gait disorder  3. Trigeminal neuralgia, right  4. Memory disorder  The patient has had some improvement in the trigeminal neuralgia pain. We will continue the medications as is for now. The patient will remain on the Avonex. She will follow-up in  about 4 or 5 months. We will plan on rechecking blood work at that time. The family is to contact our office if the pain worsens.  Jill Alexanders MD 08/11/2015 6:37 PM  [facility] [address] [place], [place] [address]  Phone [phone] Fax [phone]

## 2015-08-11 NOTE — Patient Instructions (Signed)
Trigeminal Neuralgia °Trigeminal neuralgia is a nerve disorder that causes attacks of severe facial pain. The attacks last from a few seconds to several minutes. They can happen for days, weeks, or months and then go away for months or years. Trigeminal neuralgia is also called tic douloureux. °CAUSES °This condition is caused by damage to a nerve in the face that is called the trigeminal nerve. An attack can be triggered by: °· Talking. °· Chewing. °· Putting on makeup. °· Washing your face. °· Shaving your face. °· Brushing your teeth. °· Touching your face. °RISK FACTORS °This condition is more likely to develop in: °· Women. °· People who are 50 years of age or older. °SYMPTOMS °The main symptom of this condition is pain in the jaw, lips, eyes, nose, scalp, forehead, and face. The pain may be intense, stabbing, electric, or shock-like. °DIAGNOSIS °This condition is diagnosed with a physical exam. A CT scan or MRI may be done to rule out other conditions that can cause facial pain. °TREATMENT °This condition may be treated with: °· Avoiding the things that trigger your attacks. °· Pain medicine. °· Surgery. This may be done in severe cases if other medical treatment does not provide relief. °HOME CARE INSTRUCTIONS °· Take over-the-counter and prescription medicines only as told by your health care provider. °· If you wish to get pregnant, talk with your health care provider before you start trying to get pregnant. °· Avoid the things that trigger your attacks. It may help to: °¨ Chew on the unaffected side of your mouth. °¨ Avoid touching your face. °¨ Avoid blasts of hot or cold air. °SEEK MEDICAL CARE IF: °· Your pain medicine is not helping. °· You develop new, unexplained symptoms, such as: °¨ Double vision. °¨ Facial weakness. °¨ Changes in hearing or balance. °· You become pregnant. °SEEK IMMEDIATE MEDICAL CARE IF: °· Your pain is unbearable, and your pain medicine does not help. °  °This information is not  intended to replace advice given to you by your health care provider. Make sure you discuss any questions you have with your health care provider. °  °Document Released: 09/23/2000 Document Revised: 06/17/2015 Document Reviewed: 01/19/2015 °Elsevier Interactive Patient Education ©2016 Elsevier Inc. ° °

## 2015-08-19 DIAGNOSIS — F419 Anxiety disorder, unspecified: Secondary | ICD-10-CM | POA: Diagnosis not present

## 2015-08-19 DIAGNOSIS — I10 Essential (primary) hypertension: Secondary | ICD-10-CM | POA: Diagnosis not present

## 2015-08-23 ENCOUNTER — Other Ambulatory Visit: Payer: Self-pay | Admitting: Neurology

## 2015-08-24 ENCOUNTER — Other Ambulatory Visit: Payer: Self-pay

## 2015-08-24 ENCOUNTER — Telehealth: Payer: Self-pay | Admitting: *Deleted

## 2015-08-24 MED ORDER — TRAMADOL HCL 50 MG PO TABS: ORAL_TABLET | ORAL | Status: DC

## 2015-08-24 NOTE — Telephone Encounter (Signed)
Called and spoke to [place] (ok per DPR) to advise Rx tramadol ready to pick up in office. Advised to bring valid photo ID when picking up. He verbalized understanding.

## 2015-08-25 ENCOUNTER — Other Ambulatory Visit: Payer: Self-pay | Admitting: Nurse Practitioner

## 2015-08-25 ENCOUNTER — Other Ambulatory Visit: Payer: Self-pay | Admitting: Neurology

## 2015-08-25 NOTE — Telephone Encounter (Signed)
Duplicate Request.

## 2015-09-07 DIAGNOSIS — Z23 Encounter for immunization: Secondary | ICD-10-CM | POA: Diagnosis not present

## 2015-09-13 ENCOUNTER — Other Ambulatory Visit: Payer: Self-pay | Admitting: Neurology

## 2015-09-16 ENCOUNTER — Telehealth: Payer: Self-pay | Admitting: Neurology

## 2015-09-16 NOTE — Telephone Encounter (Signed)
Patient's son is calling to advise that a letter was received stating the patient has lost funding for medication Interferon Beta-1a (AVONEX PEN) 30 MCG/0.5ML AJKT and it will not be covered in 2017. Please call to discuss. Thank you.

## 2015-09-16 NOTE — Telephone Encounter (Signed)
I called back and spoke with Mr Aminov.  He said the patient had been getting assistance for Avonex through a grant, however, the funding has been exhausted for this year.  They provided the patient with [place] that may be able to help them, but he also asked for the number to [place].  I provided the number, [phone].  Says the patient has enough meds to last until January, he's just trying to start looking for options to help pay for meds.  If for some reason none of the programs are able to help them, they will call back and let us know.

## 2015-09-17 ENCOUNTER — Other Ambulatory Visit: Payer: Self-pay | Admitting: Neurology

## 2015-09-22 DIAGNOSIS — Z1231 Encounter for screening mammogram for malignant neoplasm of breast: Secondary | ICD-10-CM | POA: Diagnosis not present

## 2015-10-07 DIAGNOSIS — H6120 Impacted cerumen, unspecified ear: Secondary | ICD-10-CM | POA: Diagnosis not present

## 2015-10-07 DIAGNOSIS — F329 Major depressive disorder, single episode, unspecified: Secondary | ICD-10-CM | POA: Diagnosis not present

## 2015-10-07 DIAGNOSIS — I10 Essential (primary) hypertension: Secondary | ICD-10-CM | POA: Diagnosis not present

## 2015-10-07 DIAGNOSIS — G35 Multiple sclerosis: Secondary | ICD-10-CM | POA: Diagnosis not present

## 2015-10-07 DIAGNOSIS — E7800 Pure hypercholesterolemia, unspecified: Secondary | ICD-10-CM | POA: Diagnosis not present

## 2015-11-10 ENCOUNTER — Other Ambulatory Visit: Payer: Self-pay | Admitting: Nurse Practitioner

## 2015-11-11 NOTE — Telephone Encounter (Signed)
Appt 12-15-15 CM/NP

## 2015-11-16 ENCOUNTER — Other Ambulatory Visit: Payer: Self-pay | Admitting: Neurology

## 2015-11-17 ENCOUNTER — Other Ambulatory Visit: Payer: Self-pay | Admitting: Neurology

## 2015-11-17 ENCOUNTER — Telehealth: Payer: Self-pay | Admitting: *Deleted

## 2015-11-17 NOTE — Telephone Encounter (Signed)
[place] for Enrichment received from [place] sent to [place] and Dr Jannifer Franklin 11/17/15

## 2015-11-18 ENCOUNTER — Telehealth: Payer: Self-pay | Admitting: *Deleted

## 2015-11-18 NOTE — Telephone Encounter (Signed)
[place] for Enrichment received,completed by Dr Jannifer Franklin and Charisse Klinefelter faxed 11/18/15.

## 2015-11-18 NOTE — Telephone Encounter (Signed)
Form completed, signed and returned to Donna. 

## 2015-11-27 ENCOUNTER — Telehealth: Payer: Self-pay | Admitting: Neurology

## 2015-11-27 MED ORDER — INTERFERON BETA-1A 30 MCG/0.5ML IM AJKT: 30.0000 ug | AUTO-INJECTOR | INTRAMUSCULAR | Status: DC

## 2015-11-27 NOTE — Telephone Encounter (Signed)
Pt's son called requesting refill for Interferon Beta-1a (AVONEX PEN) 30 MCG/0.5ML AJKT sent to [place] fax# [phone]

## 2015-12-15 ENCOUNTER — Ambulatory Visit (INDEPENDENT_AMBULATORY_CARE_PROVIDER_SITE_OTHER): Payer: Medicare Other | Admitting: Nurse Practitioner

## 2015-12-15 ENCOUNTER — Encounter: Payer: Self-pay | Admitting: Nurse Practitioner

## 2015-12-15 VITALS — BP 138/76 | HR 62

## 2015-12-15 DIAGNOSIS — R413 Other amnesia: Secondary | ICD-10-CM

## 2015-12-15 DIAGNOSIS — G500 Trigeminal neuralgia: Secondary | ICD-10-CM

## 2015-12-15 DIAGNOSIS — R269 Unspecified abnormalities of gait and mobility: Secondary | ICD-10-CM | POA: Diagnosis not present

## 2015-12-15 DIAGNOSIS — Z5181 Encounter for therapeutic drug level monitoring: Secondary | ICD-10-CM

## 2015-12-15 DIAGNOSIS — G35 Multiple sclerosis: Secondary | ICD-10-CM | POA: Diagnosis not present

## 2015-12-15 MED ORDER — DONEPEZIL HCL 5 MG PO TABS: 5.0000 mg | ORAL_TABLET | Freq: Every day | ORAL | Status: DC

## 2015-12-15 MED ORDER — CARBAMAZEPINE 200 MG PO TABS: ORAL_TABLET | ORAL | Status: DC

## 2015-12-15 NOTE — Progress Notes (Signed)
I have read the note, and I agree with the clinical assessment and plan.  WILLIS,CHARLES KEITH   

## 2015-12-15 NOTE — Patient Instructions (Addendum)
Continue Aricept at 5mg  daily will refill Continue Avonex weekly Continue Tegretol at current dose will refill Continue Lyrica at current dose Will check labs today F/U in 6 months

## 2015-12-15 NOTE — Progress Notes (Signed)
[facility]  PATIENT: [name] DOB: 05-17-1946   REASON FOR VISIT:  Follow-up for multiple sclerosis, memory difficulties, trigeminal neuralgia and gait abnormality HISTORY FROM: patient and son    HISTORY OF PRESENT ILLNESS:[name] is a 70 year old right-handed black female with a history of multiple sclerosis with end-stage features. The patient is on Avonex for her MS, she is tolerating this well. The patient has  trigeminal neuralgia, no  recent exacerbations. The patient is also on Lyrica.  The patient has control of her pain at present. The patient is in general sleeping fairly well, she is able to eat and drink. The patient does have a health aid and assistance that comes in at times to help with her bathing and dressing. The son is the primary caretaker. The patient is nonambulatory.She does ambulate some in the home with a walker according to the son There have been no recent skin breakdown issues. The patient has not had any recent falls.Her memory is stable.she returns for reevaluation   REVIEW OF SYSTEMS: Full 14 system review of systems performed and notable only for those listed, all others are neg:  Constitutional: fatigue  Cardiovascular: neg Ear/Nose/Throat: neg  Skin: neg Eyes: neg Respiratory: neg Gastroitestinal: urinary incontinence  Hematology/Lymphatic: neg  Endocrine: neg Musculoskeletal:walking difficulty Allergy/Immunology: neg Neurological: weakness Psychiatric: neg Sleep : neg   ALLERGIES: Allergies  Allergen Reactions  . Shellfish Allergy     Hives     HOME MEDICATIONS: Outpatient Prescriptions Prior to Visit  Medication Sig Dispense Refill  . acetaminophen (TYLENOL) 500 MG tablet Take 500 mg by mouth as needed.    . ALPRAZolam (XANAX) 0.25 MG tablet Take 0.25 mg by mouth at bedtime as needed. For anxiety one half a pill.    Marland Kitchen amLODipine (NORVASC) 10 MG tablet Take 1 tablet (10 mg total) by mouth daily. 30  tablet 3  . atorvastatin (LIPITOR) 40 MG tablet Take 40 mg by mouth daily.      . baclofen (LIORESAL) 10 MG tablet TAKE ONE TABLET TWICE DAILY AND TWO AT NIGHT 120 tablet 1  . buPROPion (WELLBUTRIN XL) 300 MG 24 hr tablet Take 300 mg by mouth daily.    . carbamazepine (TEGRETOL) 200 MG tablet 200mg  tid and 100 at hs 110 tablet 6  . Cholecalciferol (CVS VIT D 5000 HIGH-POTENCY PO) Take 5,000 Int'l Units/L by mouth daily.    Marland Kitchen donepezil (ARICEPT) 5 MG tablet TAKE 1 TABLET BY MOUTH EVERY DAY 90 tablet 1  . FIBER SELECT GUMMIES PO Take 5 g by mouth 2 (two) times daily.     . fish oil-omega-3 fatty acids 1000 MG capsule Take 2 g by mouth 3 (three) times daily.    . Interferon Beta-1a (AVONEX PEN) 30 MCG/0.5ML AJKT Inject 30 mcg into the muscle every 7 (seven) days. 1 each 3  . labetalol (NORMODYNE) 100 MG tablet Take 100 mg by mouth 2 (two) times daily.    Marland Kitchen losartan-hydrochlorothiazide (HYZAAR) 100-12.5 MG per tablet     . LYRICA 100 MG capsule TAKE ONE CAPSULE BY MOUTH TWICE A DAY 60 capsule 1  . Multiple Vitamins-Minerals (MULTIVITAMINS THER. W/MINERALS) TABS Take 1 tablet by mouth daily.      . potassium chloride SA (K-DUR,KLOR-CON) 20 MEQ tablet Take 20 mEq by mouth 3 (three) times daily.      . sertraline (ZOLOFT) 50 MG tablet Take 50 mg by mouth daily. One and one half tablet once a day in morning.    Marland Kitchen  tolterodine (DETROL LA) 2 MG 24 hr capsule TAKE 1 CAPSULE BY MOUTH AT BEDTIME 90 capsule 1  . traMADol (ULTRAM) 50 MG tablet TAKE 1 TABLET BY MOUTH EVERY 4 TO 6 HOURS AS NEEDED MUST LAST 28 DAYS 150 tablet 3  . HYDROcodone-acetaminophen (VICODIN) 5-500 MG per tablet Take 1 tablet by mouth every 6 (six) hours as needed for pain.     No facility-administered medications prior to visit.    PAST MEDICAL HISTORY: Past Medical History  Diagnosis Date  . Multiple sclerosis (HCC)     Right sided weakness and gait disorder, bladder and bowel problems   . Hypertension   . Depression   . 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 Trigeminal neuralgia   . Dyslipidemia   . Fibroids   . Overdose of muscle relaxant     Unintentional baclofen overdose   . Trigeminal neuralgia     Right  . Abnormality of gait   . DVT (deep venous thrombosis) (HCC)     Left leg  . Neurogenic bladder   . Memory difficulties 12/10/2014    PAST SURGICAL HISTORY: Past Surgical History  Procedure Laterality Date  . Strabismus surgery      Right eye   . Gamma knife procedure for rt. trigeminal neuralgia Right   . Left leg dvt Left     FAMILY HISTORY: Family History  Problem Relation Age of Onset  . Hypertension    . Heart disease    . Uterine cancer    . Multiple sclerosis Sister   . Cervical cancer Mother   . Liver disease Father   . Hyperlipidemia Brother   . Pancreatitis Brother     SOCIAL HISTORY: Social History   Social History  . Marital Status: Divorced    Spouse Name: N/A  . Number of Children: 2  . Years of Education: 16   Occupational History  . disabled school teacher    Social History Main Topics  . Smoking status: Never Smoker   . Smokeless tobacco: Never Used  . Alcohol Use: No  . Drug Use: No  . Sexual Activity: No   Other Topics Concern  . Not on file   Social History Narrative   Divorced, has a son and daughter. [name] is very involved in her care. [phone]   Living with her son, [name] as FT caregiver.  02-09-15 ssy      Patient drinks about 2 sodas daily.   Patient is right handed.      PHYSICAL EXAM  Filed Vitals:   12/15/15 1524  BP: 138/76  Pulse: 62   There is no weight on file to calculate BMI. General: The patient is alert and cooperative at the time of the examination. Skin: 1-2+ edema below the knees is noted..   Neurologic Exam Mental status: The patient is alert MMSE 22/30. AFT 10.  The patient is cooperative. Cranial nerves: Facial symmetry is present. Speech is normal, no aphasia or dysarthria is noted. Extraocular  movements are full. Visual fields are full. Motor: The patient has good strength in upper extremities. With the lower extremity is, there is 4/5 strength on the left, 3/5 strength on the right. Sensory examination: Soft touch sensation is symmetric on the face, arms, and legs. Coordination: The patient has slight ataxia with finger-nose-finger bilaterally. The patient is unable to perform heel-to-shin .Marland Kitchen Gait and station: The patient is unable to ambulate. She is  in a wheelchair. Reflexes: Deep tendon reflexes are symmetric.     DIAGNOSTIC DATA (LABS, IMAGING, TESTING) -  ASSESSMENT AND PLAN  70 y.o. year old female  has a past medical history of Multiple sclerosis ([place]);  Depression; Organic brain syndrome; Trigeminal neuralgia; Trigeminal neuralgia; Abnormality of gait;  Neurogenic bladder; and Memory difficulties (12/10/2014). here to follow-up.  Continue Aricept at 5mg  daily will refill Continue Avonex weekly Continue Tegretol at current dose will refill Continue Lyrica at current dose Will check labs today, CBC, CMP and CBZ level F/U in 6 months Dennie Bible, [facility], [facility] APRN  [facility] [address], [place], [place] [address] [phone]

## 2015-12-16 LAB — CBC WITH DIFFERENTIAL/PLATELET
BASOS: 1 %
Basophils Absolute: 0 10*3/uL (ref 0.0–0.2)
EOS (ABSOLUTE): 0 10*3/uL (ref 0.0–0.4)
EOS: 1 %
HEMATOCRIT: 37.9 % (ref 34.0–46.6)
HEMOGLOBIN: 12.6 g/dL (ref 11.1–15.9)
Immature Grans (Abs): 0 10*3/uL (ref 0.0–0.1)
Immature Granulocytes: 0 %
LYMPHS ABS: 1.4 10*3/uL (ref 0.7–3.1)
Lymphs: 34 %
MCH: 30.7 pg (ref 26.6–33.0)
MCHC: 33.2 g/dL (ref 31.5–35.7)
MCV: 92 fL (ref 79–97)
MONOCYTES: 10 %
MONOS ABS: 0.4 10*3/uL (ref 0.1–0.9)
NEUTROS ABS: 2.3 10*3/uL (ref 1.4–7.0)
Neutrophils: 54 %
Platelets: 195 10*3/uL (ref 150–379)
RBC: 4.11 x10E6/uL (ref 3.77–5.28)
RDW: 13.9 % (ref 12.3–15.4)
WBC: 4.1 10*3/uL (ref 3.4–10.8)

## 2015-12-16 LAB — COMPREHENSIVE METABOLIC PANEL
ALBUMIN: 4.1 g/dL (ref 3.6–4.8)
ALK PHOS: 116 IU/L (ref 39–117)
ALT: 15 IU/L (ref 0–32)
AST: 19 IU/L (ref 0–40)
Albumin/Globulin Ratio: 1.5 (ref 1.1–2.5)
BUN / CREAT RATIO: 25 (ref 11–26)
BUN: 17 mg/dL (ref 8–27)
Bilirubin Total: <0.2 mg/dL (ref 0.0–1.2)
CO2: 25 mmol/L (ref 18–29)
CREATININE: 0.68 mg/dL (ref 0.57–1.00)
Calcium: 9.1 mg/dL (ref 8.7–10.3)
Chloride: 103 mmol/L (ref 96–106)
GFR calc non Af Amer: 91 mL/min/{1.73_m2} (ref >59)
GFR, EST AFRICAN AMERICAN: 105 mL/min/{1.73_m2} (ref >59)
GLOBULIN, TOTAL: 2.8 g/dL (ref 1.5–4.5)
Glucose: 113 mg/dL — ABNORMAL HIGH (ref 65–99)
Potassium: 3.9 mmol/L (ref 3.5–5.2)
SODIUM: 146 mmol/L — AB (ref 134–144)
TOTAL PROTEIN: 6.9 g/dL (ref 6.0–8.5)

## 2015-12-16 LAB — CARBAMAZEPINE LEVEL, TOTAL: Carbamazepine (Tegretol), S: 9.4 ug/mL (ref 4.0–12.0)

## 2016-01-09 ENCOUNTER — Other Ambulatory Visit: Payer: Self-pay | Admitting: Nurse Practitioner

## 2016-01-13 ENCOUNTER — Other Ambulatory Visit: Payer: Self-pay | Admitting: Neurology

## 2016-01-20 DIAGNOSIS — I10 Essential (primary) hypertension: Secondary | ICD-10-CM | POA: Diagnosis not present

## 2016-01-29 DIAGNOSIS — H2513 Age-related nuclear cataract, bilateral: Secondary | ICD-10-CM | POA: Diagnosis not present

## 2016-02-01 ENCOUNTER — Telehealth: Payer: Self-pay | Admitting: Neurology

## 2016-02-01 DIAGNOSIS — I10 Essential (primary) hypertension: Secondary | ICD-10-CM | POA: Diagnosis not present

## 2016-02-01 MED ORDER — INTERFERON BETA-1A 30 MCG/0.5ML IM AJKT: 30.0000 ug | AUTO-INJECTOR | INTRAMUSCULAR | Status: DC

## 2016-02-01 NOTE — Telephone Encounter (Signed)
[place] [phone] called to request refill of Interferon Beta-1a (AVONEX PEN) 30 MCG/0.5ML AJKT

## 2016-02-01 NOTE — Telephone Encounter (Signed)
90 day rx w/ refills sent to specialty pharmacy

## 2016-02-04 NOTE — Telephone Encounter (Signed)
PA Case: [id number], Status: Approved, Coverage Starts on: 02/04/2016 12:00:00 AM, Coverage Ends on: 02/03/2018 12:00:00 AM. Questions? Contact [phone].

## 2016-02-10 ENCOUNTER — Telehealth: Payer: Self-pay | Admitting: Neurology

## 2016-02-10 NOTE — Telephone Encounter (Signed)
Pt's son called and needs refill on Interferon Beta-1a (AVONEX PEN) 30 MCG/0.5ML AJKT. Pt only has 2 injections left. Please send to [place]. May call son back at [phone]

## 2016-02-11 MED ORDER — INTERFERON BETA-1A 30 MCG/0.5ML IM AJKT: 30.0000 ug | AUTO-INJECTOR | INTRAMUSCULAR | Status: DC

## 2016-02-11 NOTE — Telephone Encounter (Signed)
Rx resent to specialty pharmacy as requested.

## 2016-03-09 ENCOUNTER — Other Ambulatory Visit: Payer: Self-pay | Admitting: Neurology

## 2016-03-24 ENCOUNTER — Telehealth: Payer: Self-pay | Admitting: Neurology

## 2016-03-24 NOTE — Telephone Encounter (Signed)
This is MS pt w/ some memory difficulties. Last OV was w/ Hoyle Sauer NP on 12/15/15 w/ f/u scheduled on 06/26/16. Would this be something that Hoyle Sauer can approve in Dr. Jannifer Franklin' absence since she saw pt last? Thanks!

## 2016-03-24 NOTE — Telephone Encounter (Signed)
Son [name] called, states He, patient and patient's brother (his [name]) were all living together, Uncle died in January 10, 2001, house is paid for, deed is in Uncle's name, legally house is now patient's, lawyer is trying to get deed switched into her name, needs letter from Dr. Jannifer Franklin that says patient can understand and comprehend. Please call [phone].

## 2016-03-24 NOTE — Telephone Encounter (Signed)
This pt last seen 12-15-15 by you.  Would you want to see about this or Dr. Jannifer Franklin?, then can make appt accordingly.

## 2016-03-24 NOTE — Telephone Encounter (Signed)
Returned call to pt's son. Let him know that Dr. Jannifer Franklin is out of the office this week and NP does not feel that she can write requested letter d/t seeing pt for memory.

## 2016-03-24 NOTE — Telephone Encounter (Signed)
We see the patient for memory loss and she is on medication for that.

## 2016-03-27 ENCOUNTER — Encounter: Payer: Self-pay | Admitting: Neurology

## 2016-03-27 NOTE — Telephone Encounter (Signed)
I called the son. 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 She should be able to understand the basic transaction such as this, however. I will dictate a letter for them.

## 2016-04-06 ENCOUNTER — Telehealth: Payer: Self-pay

## 2016-04-06 NOTE — Telephone Encounter (Signed)
I called the son. The patient is having some generalized weakness, difficulty with standing. She seems to be feeling a low but better now, we can certainly get her worked in if she continues to do poorly and get blood work and urinalysis. The patient is not having fevers or chills, she feels cold all the time. The son will call us depending on how she is doing.

## 2016-04-06 NOTE — Telephone Encounter (Signed)
Received call from pt's son, [name]. He reports that she's had increased weakness and difficulty standing. Says that even the aid was having a hard time moving her this morning. Offered 12:00 appt today but pt's son declined saying that it was her "MS medication day" and doesn't want to "rush her over." Says that he would administer Avonex around 11:30 this morning and call back around 4 this afternoon to schedule appt later in the week, if needed. Pt c/o sore throat but no fever or congestion. Son believes it may be from her sleeping under the fan. Wonders if the heat is getting to her, says that she has not tolerated prednisone pack well in the past d/t thrush.

## 2016-04-13 DIAGNOSIS — H9190 Unspecified hearing loss, unspecified ear: Secondary | ICD-10-CM | POA: Diagnosis not present

## 2016-04-13 DIAGNOSIS — F419 Anxiety disorder, unspecified: Secondary | ICD-10-CM | POA: Diagnosis not present

## 2016-04-13 DIAGNOSIS — F325 Major depressive disorder, single episode, in full remission: Secondary | ICD-10-CM | POA: Diagnosis not present

## 2016-04-13 DIAGNOSIS — E7800 Pure hypercholesterolemia, unspecified: Secondary | ICD-10-CM | POA: Diagnosis not present

## 2016-04-13 DIAGNOSIS — G35 Multiple sclerosis: Secondary | ICD-10-CM | POA: Diagnosis not present

## 2016-04-13 DIAGNOSIS — I10 Essential (primary) hypertension: Secondary | ICD-10-CM | POA: Diagnosis not present

## 2016-04-21 ENCOUNTER — Other Ambulatory Visit: Payer: Self-pay | Admitting: *Deleted

## 2016-04-21 MED ORDER — BACLOFEN 10 MG PO TABS: ORAL_TABLET | ORAL | Status: DC

## 2016-04-21 NOTE — Telephone Encounter (Signed)
Fax confirmation received.  [phone].

## 2016-04-26 ENCOUNTER — Telehealth: Payer: Self-pay | Admitting: Nurse Practitioner

## 2016-04-26 NOTE — Telephone Encounter (Signed)
Reviewed CMP from 04/13/2016 within normal limits. Lipid panel cholesterol 161 LDL 92 HDL 46 triglycerides 120. These were faxed from Dr. Inda Merlin office

## 2016-06-02 DIAGNOSIS — H903 Sensorineural hearing loss, bilateral: Secondary | ICD-10-CM | POA: Diagnosis not present

## 2016-06-16 ENCOUNTER — Encounter: Payer: Self-pay | Admitting: Nurse Practitioner

## 2016-06-16 ENCOUNTER — Ambulatory Visit (INDEPENDENT_AMBULATORY_CARE_PROVIDER_SITE_OTHER): Payer: Medicare Other | Admitting: Nurse Practitioner

## 2016-06-16 VITALS — BP 146/80 | HR 60 | Ht 64.0 in

## 2016-06-16 DIAGNOSIS — R269 Unspecified abnormalities of gait and mobility: Secondary | ICD-10-CM | POA: Diagnosis not present

## 2016-06-16 DIAGNOSIS — G500 Trigeminal neuralgia: Secondary | ICD-10-CM | POA: Diagnosis not present

## 2016-06-16 DIAGNOSIS — R413 Other amnesia: Secondary | ICD-10-CM

## 2016-06-16 DIAGNOSIS — G35 Multiple sclerosis: Secondary | ICD-10-CM | POA: Diagnosis not present

## 2016-06-16 DIAGNOSIS — N319 Neuromuscular dysfunction of bladder, unspecified: Secondary | ICD-10-CM | POA: Insufficient documentation

## 2016-06-16 MED ORDER — PREGABALIN 100 MG PO CAPS: ORAL_CAPSULE | ORAL | 5 refills | Status: DC

## 2016-06-16 MED ORDER — CARBAMAZEPINE 200 MG PO TABS: ORAL_TABLET | ORAL | 8 refills | Status: DC

## 2016-06-16 NOTE — Progress Notes (Signed)
Received fax confirmation for lyrica [facility] [phone] ssy

## 2016-06-16 NOTE — Patient Instructions (Addendum)
Continue Aricept at 5mg  daily  Continue Avonex weekly Continue Tegretol at current dose will refill Continue Lyrica at current dose will refill Continue Detrol for neurogenic bladder Labs reviewed from Dr. Inda Merlin F/U in 6 months next with Dr. Jannifer Franklin

## 2016-06-16 NOTE — Progress Notes (Signed)
I have read the note, and I agree with the clinical assessment and plan.  [name]   

## 2016-06-16 NOTE — Progress Notes (Signed)
[facility]  PATIENT: [name] DOB: [birth year]   REASON FOR VISIT:  Follow-up for multiple sclerosis, memory difficulties, trigeminal neuralgia and gait abnormality HISTORY FROM: patient and son    HISTORY OF PRESENT ILLNESS:[name] is a 70 year old right-handed black female with a history of multiple sclerosis with end-stage features. The patient is on Avonex for her MS, she is tolerating this well. The patient has  trigeminal neuralgia, no  recent exacerbations. She is on Tegretol and  Lyrica.  The patient has control of her pain at present. The patient is in general sleeping fairly well, she is able to eat and drink. She is on Aricept for her memory difficulties without side effects The patient does have a CNA to  assist that comes in at times to help with her bathing and dressing. The son is the primary caretaker. The patient is nonambulatory.She does ambulate some in the home with a walker according to the son There have been no recent skin breakdown issues. The patient has not had any recent falls.Her memory is stable.She returns for reevaluation   REVIEW OF SYSTEMS: Full 14 system review of systems performed and notable only for those listed, all others are neg:  Constitutional: fatigue  Cardiovascular: neg Ear/Nose/Throat: neg  Skin: neg Eyes: neg Respiratory: neg Gastroitestinal: urinary incontinence  Hematology/Lymphatic: neg  Endocrine: neg Musculoskeletal:walking difficulty Allergy/Immunology: neg Neurological: weakness Psychiatric: Anxiety Sleep : neg   ALLERGIES: Allergies  Allergen Reactions  . Prednisone     Had gotten thrush  . Shellfish Allergy     Hives     HOME MEDICATIONS: Outpatient Medications Prior to Visit  Medication Sig Dispense Refill  . acetaminophen (TYLENOL) 500 MG tablet Take 500 mg by mouth as needed.    . ALPRAZolam (XANAX) 0.25 MG tablet Take 0.25 mg by mouth at bedtime as needed. For anxiety one half  a pill.    Marland Kitchen amLODipine (NORVASC) 10 MG tablet Take 1 tablet (10 mg total) by mouth daily. 30 tablet 3  . atorvastatin (LIPITOR) 40 MG tablet Take 40 mg by mouth daily.      . baclofen (LIORESAL) 10 MG tablet TAKE ONE TABLET TWICE DAILY AND TWO AT NIGHT 360 tablet 2  . buPROPion (WELLBUTRIN XL) 300 MG 24 hr tablet Take 300 mg by mouth daily.    . carbamazepine (TEGRETOL) 200 MG tablet 200mg  tid and 100 at hs 110 tablet 8  . Cholecalciferol (CVS VIT D 5000 HIGH-POTENCY PO) Take 5,000 Int'l Units/L by mouth daily.    Marland Kitchen donepezil (ARICEPT) 5 MG tablet Take 1 tablet (5 mg total) by mouth daily. 90 tablet 2  . FIBER SELECT GUMMIES PO Take 5 g by mouth 2 (two) times daily.     . fish oil-omega-3 fatty acids 1000 MG capsule Take 2 g by mouth 3 (three) times daily.    . Interferon Beta-1a (AVONEX PEN) 30 MCG/0.5ML AJKT Inject 30 mcg into the muscle every 7 (seven) days. 12 each 3  . labetalol (NORMODYNE) 100 MG tablet Take 100 mg by mouth 2 (two) times daily.    Marland Kitchen losartan-hydrochlorothiazide (HYZAAR) 100-12.5 MG per tablet     . LYRICA 100 MG capsule TAKE 1 CAP BY MOUTH 2 TIMES DAILY 60 capsule 5  . Multiple Vitamins-Minerals (MULTIVITAMINS THER. W/MINERALS) TABS Take 1 tablet by mouth daily.      . potassium chloride SA (K-DUR,KLOR-CON) 20 MEQ tablet Take 20 mEq by mouth 3 (three) times daily.      Marland Kitchen  sertraline (ZOLOFT) 50 MG tablet Take 50 mg by mouth daily. One and one half tablet once a day in morning.    . tolterodine (DETROL LA) 2 MG 24 hr capsule TAKE 1 CAPSULE BY MOUTH AT BEDTIME 90 capsule 3  . traMADol (ULTRAM) 50 MG tablet TAKE 1 TABLET BY MOUTH EVERY 4 TO 6 HOURS AS NEEDED MUST LAST 28 DAYS 150 tablet 3   No facility-administered medications prior to visit.     PAST MEDICAL HISTORY: Past Medical History:  Diagnosis Date  . Abnormality of gait   . Depression   . DVT (deep venous thrombosis) (HCC)    Left leg  . Dyslipidemia   . Fibroids   . Hypertension   . Memory difficulties  12/10/2014  . Multiple sclerosis (HCC)    Right sided weakness and gait disorder, bladder and bowel problems   . Neurogenic bladder   . 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 Overdose of muscle relaxant    Unintentional baclofen overdose   . Trigeminal neuralgia   . Trigeminal neuralgia    Right    PAST SURGICAL HISTORY: Past Surgical History:  Procedure Laterality Date  . gamma knife procedure for rt. trigeminal neuralgia Right   . left leg DVT Left   . STRABISMUS SURGERY     Right eye     FAMILY HISTORY: Family History  Problem Relation Age of Onset  . Hypertension    . Heart disease    . Uterine cancer    . Multiple sclerosis Sister   . Cervical cancer Mother   . Liver disease Father   . Hyperlipidemia Brother   . Pancreatitis Brother     SOCIAL HISTORY: Social History   Social History  . Marital status: Divorced    Spouse name: N/A  . Number of children: 2  . Years of education: 16   Occupational History  . disabled school teacher    Social History Main Topics  . Smoking status: Never Smoker  . Smokeless tobacco: Never Used  . Alcohol use No  . Drug use: No  . Sexual activity: No   Other Topics Concern  . Not on file   Social History Narrative   Divorced, has a son and daughter. [name] is very involved in her care. [phone]   Living with her son, [name] as FT caregiver.  02-09-15 ssy      Patient drinks about 2 sodas daily.   Patient is right handed.      PHYSICAL EXAM  Vitals:   06/16/16 1424  BP: (!) 146/80  Pulse: 60  Height: 5\' 4"  (1.626 m)   There is no height or weight on file to calculate BMI. General: The patient is alert and cooperative at the time of the examination. Skin: 1-2+ edema below the knees is noted..   Neurologic Exam Mental status: The patient is alert MMSE 22/30. AFT 5. Last 22/30.  The patient is cooperative. Cranial nerves: Facial symmetry is present. Speech is normal, no aphasia or dysarthria is noted.  Extraocular movements are full. Visual fields are full. Motor: The patient has good strength in upper extremities. With the lower extremity is, there is 4/5 strength on the left, 3/5 strength on the right. Bilateral AFOs in place Sensory examination: Soft touch sensation is symmetric on the face, arms, and legs. Coordination: The patient has slight ataxia with finger-nose-finger bilaterally. The patient is unable to perform heel-to-shin .Marland Kitchen Gait  and station: The patient is unable to ambulate. She is in a wheelchair. Reflexes: Deep tendon reflexes are symmetric.     DIAGNOSTIC DATA (LABS, IMAGING, TESTING) -  ASSESSMENT AND PLAN  70 y.o. year old female  has a past medical history of Multiple sclerosis ([place]);  Depression; Organic brain syndrome; Trigeminal neuralgia; Abnormality of gait;  Neurogenic bladder; and Memory difficulties (12/10/2014). here to follow-up.  Continue Aricept at 5mg  daily will refill Continue Avonex weekly Continue Detrol for neurogenic bladder Continue Tegretol at current dose will refill Continue Lyrica at current dose Reviewed labs from Dr. Inda Merlin office in July within normal limits F/U in 6 months, next with Dr. Jerline Pain, [facility], [facility], [facility] [address] [place], [place] [address] [phone]

## 2016-08-11 DIAGNOSIS — L723 Sebaceous cyst: Secondary | ICD-10-CM | POA: Diagnosis not present

## 2016-08-11 DIAGNOSIS — K429 Umbilical hernia without obstruction or gangrene: Secondary | ICD-10-CM | POA: Diagnosis not present

## 2016-08-19 DIAGNOSIS — K429 Umbilical hernia without obstruction or gangrene: Secondary | ICD-10-CM | POA: Diagnosis not present

## 2016-08-19 DIAGNOSIS — L89309 Pressure ulcer of unspecified buttock, unspecified stage: Secondary | ICD-10-CM | POA: Diagnosis not present

## 2016-08-19 DIAGNOSIS — L723 Sebaceous cyst: Secondary | ICD-10-CM | POA: Diagnosis not present

## 2016-10-17 DIAGNOSIS — I10 Essential (primary) hypertension: Secondary | ICD-10-CM | POA: Diagnosis not present

## 2016-10-17 DIAGNOSIS — Z23 Encounter for immunization: Secondary | ICD-10-CM | POA: Diagnosis not present

## 2016-10-17 DIAGNOSIS — F325 Major depressive disorder, single episode, in full remission: Secondary | ICD-10-CM | POA: Diagnosis not present

## 2016-10-17 DIAGNOSIS — Z1211 Encounter for screening for malignant neoplasm of colon: Secondary | ICD-10-CM | POA: Diagnosis not present

## 2016-10-17 DIAGNOSIS — E7800 Pure hypercholesterolemia, unspecified: Secondary | ICD-10-CM | POA: Diagnosis not present

## 2016-10-24 DIAGNOSIS — Z1231 Encounter for screening mammogram for malignant neoplasm of breast: Secondary | ICD-10-CM | POA: Diagnosis not present

## 2016-10-29 ENCOUNTER — Other Ambulatory Visit: Payer: Self-pay | Admitting: Nurse Practitioner

## 2016-11-08 ENCOUNTER — Telehealth: Payer: Self-pay

## 2016-11-08 NOTE — Telephone Encounter (Signed)
Called and spoke to pt's son. Let him know that handicap placard application was completed/signed and placed at the front desk for pick-up. Verbalized appreciation and will come by for form later today.

## 2016-11-11 ENCOUNTER — Other Ambulatory Visit: Payer: Self-pay

## 2016-11-11 MED ORDER — INTERFERON BETA-1A 30 MCG/0.5ML IM AJKT: 30.0000 ug | AUTO-INJECTOR | INTRAMUSCULAR | 3 refills | Status: DC

## 2016-11-11 NOTE — Telephone Encounter (Signed)
Refills e-scribed per faxed request from [facility].

## 2016-11-15 ENCOUNTER — Other Ambulatory Visit: Payer: Self-pay | Admitting: Nurse Practitioner

## 2016-11-15 DIAGNOSIS — Z1211 Encounter for screening for malignant neoplasm of colon: Secondary | ICD-10-CM | POA: Diagnosis not present

## 2016-11-15 DIAGNOSIS — L910 Hypertrophic scar: Secondary | ICD-10-CM | POA: Diagnosis not present

## 2016-11-15 DIAGNOSIS — L089 Local infection of the skin and subcutaneous tissue, unspecified: Secondary | ICD-10-CM | POA: Diagnosis not present

## 2016-11-17 ENCOUNTER — Other Ambulatory Visit: Payer: Self-pay | Admitting: Nurse Practitioner

## 2016-11-18 ENCOUNTER — Other Ambulatory Visit: Payer: Self-pay | Admitting: Nurse Practitioner

## 2016-11-30 ENCOUNTER — Telehealth: Payer: Self-pay | Admitting: Neurology

## 2016-11-30 NOTE — Telephone Encounter (Signed)
Patient's son is calling stating [facility] needs a list of medications the patient is taking in order to get patient assistance with Avonex. Please fax to [phone].

## 2016-11-30 NOTE — Telephone Encounter (Signed)
Faxed list as requested to [facility]. Received fax confirmation.

## 2016-12-20 ENCOUNTER — Encounter: Payer: Self-pay | Admitting: Neurology

## 2016-12-20 ENCOUNTER — Ambulatory Visit (INDEPENDENT_AMBULATORY_CARE_PROVIDER_SITE_OTHER): Payer: Medicare HMO | Admitting: Neurology

## 2016-12-20 ENCOUNTER — Encounter (INDEPENDENT_AMBULATORY_CARE_PROVIDER_SITE_OTHER): Payer: Self-pay

## 2016-12-20 VITALS — BP 139/74 | HR 70 | Ht 64.0 in

## 2016-12-20 DIAGNOSIS — R269 Unspecified abnormalities of gait and mobility: Secondary | ICD-10-CM

## 2016-12-20 DIAGNOSIS — Z5181 Encounter for therapeutic drug level monitoring: Secondary | ICD-10-CM

## 2016-12-20 DIAGNOSIS — R413 Other amnesia: Secondary | ICD-10-CM | POA: Diagnosis not present

## 2016-12-20 DIAGNOSIS — G35 Multiple sclerosis: Secondary | ICD-10-CM | POA: Diagnosis not present

## 2016-12-20 MED ORDER — PREDNISONE 10 MG PO TABS: ORAL_TABLET | ORAL | 0 refills | Status: DC

## 2016-12-20 NOTE — Progress Notes (Signed)
Reason for visit: Multiple sclerosis   [name] is an 71 y.o. female  History of present illness:  Ms. [name] is a 71 year old right-handed black female with a history of multiple sclerosis. 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 She mainly mobilizes with a wheelchair. She has had some problems with trigeminal neuralgia pain on the right which has worsened some over the last 2 or 3 weeks. Within the last day or so she has developed some problems using the right hand with a wrist drop. The patient denies any numbness or weakness in the right arm or any discomfort, and from the neck down the arm. The patient has a neurogenic bladder, this has been stable over time, she reports no new vision changes. She takes Ultram for discomfort, she also is on carbamazepine for the neuralgia discomfort. She returns to this office for an evaluation.  Past Medical History:  Diagnosis Date  . Abnormality of gait   . Depression   . DVT (deep venous thrombosis) (HCC)    Left leg  . Dyslipidemia   . Fibroids   . Hypertension   . Memory difficulties 12/10/2014  . Multiple sclerosis (HCC)    Right sided weakness and gait disorder, bladder and bowel problems   . Neurogenic bladder   . 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 Overdose of muscle relaxant    Unintentional baclofen overdose   . Trigeminal neuralgia   . Trigeminal neuralgia    Right    Past Surgical History:  Procedure Laterality Date  . gamma knife procedure for rt. trigeminal neuralgia Right   . left leg DVT Left   . STRABISMUS SURGERY     Right eye     Family History  Problem Relation Age of Onset  . Multiple sclerosis Sister   . Cervical cancer Mother   . Liver disease Father   . Hyperlipidemia Brother   . Pancreatitis Brother   . Hypertension    . Heart disease    . Uterine cancer       Social history:  reports that she has never smoked. She has never used smokeless tobacco. She reports that she does not drink alcohol or use drugs.    Allergies  Allergen Reactions  . Prednisone     Had gotten thrush  . Shellfish Allergy     Hives     Medications:  Prior to Admission medications   Medication Sig Start Date End Date Taking? Authorizing Provider  acetaminophen (TYLENOL) 500 MG tablet Take 500 mg by mouth as needed.   Yes Historical Provider, MD  ALPRAZolam Duanne Moron) 0.25 MG tablet Take 0.25 mg by mouth at bedtime as needed. For anxiety one half a pill. 11/03/11  Yes Deneise Lever, MD  amLODipine (NORVASC) 10 MG tablet Take 1 tablet (10 mg total) by mouth daily. 11/03/11  Yes Deneise Lever, MD  APPLE CIDER VINEGAR PO Take 1 capsule by mouth daily. 450mg    Yes Historical Provider, MD  atorvastatin (LIPITOR) 40 MG tablet Take 40 mg by mouth daily.     Yes Historical Provider, MD  baclofen (LIORESAL) 10 MG tablet TAKE ONE TABLET TWICE DAILY AND TWO AT NIGHT 04/21/16  Yes Dennie Bible, NP  BLACK CURRANT SEED OIL PO Take 1 capsule by mouth daily.   Yes Historical Provider, MD  buPROPion (WELLBUTRIN XL) 300 MG 24 hr tablet Take 300 mg by mouth daily.   Yes Deneise Lever, MD  carbamazepine (TEGRETOL) 200 MG tablet 200mg  tid and 100 at hs 06/16/16  Yes Dennie Bible, NP  Cholecalciferol (CVS VIT D 5000 HIGH-POTENCY PO) Take 5,000 Int'l Units/L by mouth daily.   Yes Historical Provider, MD  donepezil (ARICEPT) 5 MG tablet TAKE 1 TABLET (5 MG TOTAL) BY MOUTH DAILY. 11/17/16  Yes Dennie Bible, NP  FIBER SELECT GUMMIES PO Take 5 g by mouth 2 (two) times daily.    Yes Historical Provider, MD  fish oil-omega-3 fatty acids 1000 MG capsule Take 2 g by mouth 3 (three) times daily.   Yes Deneise Lever, MD  Interferon Beta-1a (AVONEX PEN) 30 MCG/0.5ML AJKT Inject 30 mcg into the muscle every 7 (seven) days. 11/11/16  Yes Kathrynn Ducking, MD  labetalol (NORMODYNE)  100 MG tablet Take 100 mg by mouth 2 (two) times daily.   Yes Deneise Lever, MD  losartan-hydrochlorothiazide [facility]) 100-12.5 MG per tablet  12/09/14  Yes Historical Provider, MD  Multiple Vitamins-Minerals (MULTIVITAMINS THER. W/MINERALS) TABS Take 1 tablet by mouth daily.     Yes Historical Provider, MD  potassium chloride SA (K-DUR,KLOR-CON) 20 MEQ tablet Take 20 mEq by mouth 3 (three) times daily.     Yes Historical Provider, MD  pregabalin (LYRICA) 100 MG capsule TAKE 1 CAP BY MOUTH 2 TIMES DAILY 06/16/16  Yes Dennie Bible, NP  sertraline (ZOLOFT) 50 MG tablet Take 50 mg by mouth daily. One and one half tablet once a day in morning.   Yes Historical Provider, MD  tolterodine (DETROL LA) 2 MG 24 hr capsule TAKE 1 CAPSULE BY MOUTH AT BEDTIME 03/09/16  Yes Kathrynn Ducking, MD  traMADol (ULTRAM) 50 MG tablet TAKE 1 TABLET BY MOUTH EVERY 4 TO 6 HOURS AS NEEDED MUST LAST 28 DAYS 08/24/15  Yes Kathrynn Ducking, MD  predniSONE (DELTASONE) 10 MG tablet Begin taking 6 tablets daily, taper by one tablet every other day until off the medication. 12/20/16   Kathrynn Ducking, MD    ROS:  Out of a complete 14 system review of symptoms, the patient complains only of the following symptoms, and all other reviewed systems are negative.  Right hand weakness Gait disorder Memory disorder  Blood pressure 139/74, pulse 70, height 5\' 4"  (1.626 m), SpO2 94 %.  Physical Exam  General: The patient is alert and cooperative at the time of the examination.  Skin: 2+ edema below the knees is noted bilaterally. The patient wears AFO braces bilaterally.   Neurologic Exam  Mental status: The patient is alert and oriented x 2 at the time of the examination (not oriented to date). The Mini-Mental Status Examination done today shows a total score of 17/30.   Cranial nerves: Facial symmetry is present. Speech is normal, no aphasia or dysarthria is noted. Extraocular movements are full. Visual fields are  full. Pupils are equal, round, and reactive to light. Discs are flat bilaterally.  Motor: The patient has good strength with the left upper extremity. With the right upper extremity, there is 4/5 strength with extension at the wrist, and with the extension of the fingers. The patient otherwise has good upper arm strength on the right. With the lower extremities, there is 3/5 strength with hip flexion bilaterally, 4/5 strength with knee extension bilaterally, the patient has bilateral foot drops.  Sensory examination: Soft touch sensation is  symmetric on the face, arms, and legs.  Coordination: The patient has good finger-nose-finger bilaterally. The patient is not able to perform heel to shin on either side.  Gait and station: The patient is nonambulatory, wheelchair bound.  Reflexes: Deep tendon reflexes are symmetric.   Assessment/Plan:  1. Multiple sclerosis  2. Gait disorder  3. Memory disorder  4. Trigeminal neuralgia, right  5. New onset right hand weakness  The patient will be given a trial on a prednisone Dosepak for the new weakness of the right hand, she will continue on the Avonex. She will have blood work done today. In the past, the patient has had oral thrush on prednisone, they are to look out for this and call me if this is the case. The patient will follow-up in 6 months.  Jill Alexanders MD 12/20/2016 2:53 PM  [facility] [address] [place], [place] [address]  Phone [phone] Fax [phone]

## 2016-12-20 NOTE — Patient Instructions (Signed)
   We will start prednisone for the right hand weakness. Look out for oral thrush.

## 2016-12-21 ENCOUNTER — Telehealth: Payer: Self-pay | Admitting: *Deleted

## 2016-12-21 LAB — CBC WITH DIFFERENTIAL/PLATELET
BASOS: 0 %
Basophils Absolute: 0 10*3/uL (ref 0.0–0.2)
EOS (ABSOLUTE): 0 10*3/uL (ref 0.0–0.4)
Eos: 1 %
HEMATOCRIT: 35.6 % (ref 34.0–46.6)
Hemoglobin: 11.8 g/dL (ref 11.1–15.9)
IMMATURE GRANS (ABS): 0 10*3/uL (ref 0.0–0.1)
Immature Granulocytes: 0 %
Lymphocytes Absolute: 1.5 10*3/uL (ref 0.7–3.1)
Lymphs: 40 %
MCH: 31 pg (ref 26.6–33.0)
MCHC: 33.1 g/dL (ref 31.5–35.7)
MCV: 93 fL (ref 79–97)
Monocytes Absolute: 0.4 10*3/uL (ref 0.1–0.9)
Monocytes: 12 %
NEUTROS ABS: 1.7 10*3/uL (ref 1.4–7.0)
Neutrophils: 47 %
PLATELETS: 186 10*3/uL (ref 150–379)
RBC: 3.81 x10E6/uL (ref 3.77–5.28)
RDW: 13.9 % (ref 12.3–15.4)
WBC: 3.7 10*3/uL (ref 3.4–10.8)

## 2016-12-21 LAB — COMPREHENSIVE METABOLIC PANEL
A/G RATIO: 1.4 (ref 1.2–2.2)
ALBUMIN: 4.1 g/dL (ref 3.6–4.8)
ALT: 19 IU/L (ref 0–32)
AST: 21 IU/L (ref 0–40)
Alkaline Phosphatase: 99 IU/L (ref 39–117)
BUN/Creatinine Ratio: 22 (ref 12–28)
BUN: 17 mg/dL (ref 8–27)
Bilirubin Total: 0.3 mg/dL (ref 0.0–1.2)
CALCIUM: 9.4 mg/dL (ref 8.7–10.3)
CO2: 29 mmol/L (ref 18–29)
Chloride: 101 mmol/L (ref 96–106)
Creatinine, Ser: 0.76 mg/dL (ref 0.57–1.00)
GFR calc Af Amer: 93 mL/min/{1.73_m2} (ref >59)
GFR, EST NON AFRICAN AMERICAN: 81 mL/min/{1.73_m2} (ref >59)
GLOBULIN, TOTAL: 2.9 g/dL (ref 1.5–4.5)
Glucose: 96 mg/dL (ref 65–99)
POTASSIUM: 4.1 mmol/L (ref 3.5–5.2)
SODIUM: 144 mmol/L (ref 134–144)
TOTAL PROTEIN: 7 g/dL (ref 6.0–8.5)

## 2016-12-21 LAB — CARBAMAZEPINE LEVEL, TOTAL: Carbamazepine (Tegretol), S: 12.3 ug/mL (ref 4.0–12.0)

## 2016-12-21 NOTE — Telephone Encounter (Signed)
-----   Message from Kathrynn Ducking, MD sent at 12/21/2016  8:18 AM EDT -----  Blood work is relatively unremarkable, random carbamazepine level is minimally elevated out of the therapeutic range, the patient is not symptomatic from this, would not adjust dosing. Please call the patient. ----- Message ----- From: Lavone Neri Lab Results In Sent: 12/21/2016   5:42 AM To: Kathrynn Ducking, MD

## 2016-12-21 NOTE — Telephone Encounter (Signed)
Called and spoke to pt son, [name] (on [place]) about lab results per CW,MD note. He verbalized understanding.

## 2016-12-22 ENCOUNTER — Other Ambulatory Visit: Payer: Self-pay | Admitting: Neurology

## 2016-12-22 ENCOUNTER — Other Ambulatory Visit: Payer: Self-pay | Admitting: Nurse Practitioner

## 2016-12-26 ENCOUNTER — Other Ambulatory Visit: Payer: Self-pay | Admitting: *Deleted

## 2016-12-26 MED ORDER — DONEPEZIL HCL 5 MG PO TABS: 5.0000 mg | ORAL_TABLET | Freq: Every day | ORAL | 4 refills | Status: DC

## 2016-12-27 NOTE — Telephone Encounter (Signed)
Faxed printed/signed rx tramadol to pt pharmacy. Fax: [phone]. Received confirmation.

## 2017-01-20 ENCOUNTER — Other Ambulatory Visit: Payer: Self-pay | Admitting: Nurse Practitioner

## 2017-03-04 ENCOUNTER — Other Ambulatory Visit: Payer: Self-pay | Admitting: Neurology

## 2017-04-06 ENCOUNTER — Encounter (HOSPITAL_COMMUNITY): Payer: Self-pay | Admitting: Emergency Medicine

## 2017-04-06 ENCOUNTER — Ambulatory Visit (HOSPITAL_COMMUNITY)
Admission: EM | Admit: 2017-04-06 | Discharge: 2017-04-06 | Disposition: A | Discharge status: Home / Self Care | Payer: Medicare HMO | Attending: Internal Medicine | Admitting: Internal Medicine

## 2017-04-06 DIAGNOSIS — L89001 Pressure ulcer of unspecified elbow, stage 1: Secondary | ICD-10-CM | POA: Diagnosis not present

## 2017-04-06 MED ORDER — DERMAGRAN BC EX CREA: TOPICAL_CREAM | CUTANEOUS | 0 refills | Status: DC | PRN

## 2017-04-06 MED ORDER — ALOE VESTA 2-N-1 PROTECTIVE EX OINT: TOPICAL_OINTMENT | Freq: Two times a day (BID) | CUTANEOUS | 0 refills | Status: DC | PRN

## 2017-04-06 MED ORDER — "TEGADERM AG MESH 4""X5"" EX PADS": 1.0000 | MEDICATED_PAD | CUTANEOUS | 0 refills | Status: AC

## 2017-04-06 NOTE — ED Triage Notes (Signed)
Patient has a bed sore that comes and goes for 7 years per caregiver.  Patient here today for most recent episode of bedsore, this episode noticed last week.  Family reports an odor and drainage.

## 2017-04-06 NOTE — ED Provider Notes (Signed)
CSN: [id number]     Arrival date & time 04/06/17  1115 History   None    Chief Complaint  Patient presents with  . Wound Check   (Consider location/radiation/quality/duration/timing/severity/associated sxs/prior Treatment) Patient son states she is developing a decubitis sore on right buttock.  She is not being seen by home health nurse and cannot get in to see her PCP.   The history is provided by the patient.  Wound Check  This is a new problem. The current episode started more than 1 week ago. The problem occurs constantly. The problem has not changed since onset.Nothing aggravates the symptoms. Nothing relieves the symptoms. She has tried nothing for the symptoms.    Past Medical History:  Diagnosis Date  . Abnormality of gait   . Depression   . DVT (deep venous thrombosis) (HCC)    Left leg  . Dyslipidemia   . Fibroids   . Hypertension   . Memory difficulties 12/10/2014  . Multiple sclerosis (HCC)    Right sided weakness and gait disorder, bladder and bowel problems   . Neurogenic bladder   . 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 Overdose of muscle relaxant    Unintentional baclofen overdose   . Trigeminal neuralgia   . Trigeminal neuralgia    Right   Past Surgical History:  Procedure Laterality Date  . gamma knife procedure for rt. trigeminal neuralgia Right   . left leg DVT Left   . STRABISMUS SURGERY     Right eye    Family History  Problem Relation Age of Onset  . Multiple sclerosis Sister   . Cervical cancer Mother   . Liver disease Father   . Hyperlipidemia Brother   . Pancreatitis Brother   . Hypertension Unknown   . Heart disease Unknown   . Uterine cancer Unknown    Social History  Substance Use Topics  . Smoking status: Never Smoker  . Smokeless tobacco: Never Used  . Alcohol use No   OB History    No data available     Review of Systems  Constitutional: Negative.   HENT: Negative.   Eyes: Negative.   Respiratory: Negative.    Cardiovascular: Negative.   Gastrointestinal: Negative.   Endocrine: Negative.   Genitourinary: Negative.   Skin: Positive for wound.  Allergic/Immunologic: Negative.   Neurological: Negative.     Allergies  Prednisone and Shellfish allergy  Home Medications   Prior to Admission medications   Medication Sig Start Date End Date Taking? Authorizing Provider  Potassium Chloride (KLOR-CON PO) Take 20 mEq by mouth.   Yes [provider]  acetaminophen (TYLENOL) 500 MG tablet Take 500 mg by mouth as needed.    [provider]  ALPRAZolam Duanne Moron) 0.25 MG tablet Take 0.25 mg by mouth at bedtime as needed. For anxiety one half a pill. 11/03/11   Deneise Lever, MD  amLODipine (NORVASC) 10 MG tablet Take 1 tablet (10 mg total) by mouth daily. 11/03/11   Deneise Lever, MD  APPLE CIDER VINEGAR PO Take 1 capsule by mouth daily. 450mg     [provider]  atorvastatin (LIPITOR) 40 MG tablet Take 40 mg by mouth daily.      [provider]  baclofen (LIORESAL) 10 MG tablet TAKE 1 TABLET BY MOUTH TWICE A DAY AND 2 TABLETS AT NIGHT 01/23/17   Dennie Bible, NP  BLACK CURRANT SEED OIL PO Take 1 capsule by mouth daily.  [provider]  buPROPion (WELLBUTRIN XL) 300 MG 24 hr tablet Take 300 mg by mouth daily.    Deneise Lever, MD  carbamazepine (TEGRETOL) 200 MG tablet 200mg  tid and 100 at hs 06/16/16   Dennie Bible, NP  Cholecalciferol (CVS VIT D 5000 HIGH-POTENCY PO) Take 5,000 Int'l Units/L by mouth daily.    [provider]  donepezil (ARICEPT) 5 MG tablet Take 1 tablet (5 mg total) by mouth daily. 12/26/16   Dennie Bible, NP  FIBER SELECT GUMMIES PO Take 5 g by mouth 2 (two) times daily.     [provider]  fish oil-omega-3 fatty acids 1000 MG capsule Take 2 g by mouth 3 (three) times daily.    Deneise Lever, MD  Interferon Beta-1a (AVONEX PEN) 30 MCG/0.5ML AJKT Inject 30 mcg into the muscle every 7  (seven) days. 11/11/16   Kathrynn Ducking, MD  labetalol (NORMODYNE) 100 MG tablet Take 100 mg by mouth 2 (two) times daily.    Deneise Lever, MD  losartan-hydrochlorothiazide [facility]) 100-12.5 MG per tablet  12/09/14   [provider]  LYRICA 100 MG capsule TAKE ONE CAPSULE TWICE A DAY 12/23/16   Kathrynn Ducking, MD  Multiple Vitamins-Minerals (MULTIVITAMINS THER. W/MINERALS) TABS Take 1 tablet by mouth daily.      [provider]  petrolatum-hydrophilic-aloe vera (ALOE VESTA) ointment Apply topically 2 (two) times daily as needed for wound care. 04/06/17   Lysbeth Penner, FNP  potassium chloride SA (K-DUR,KLOR-CON) 20 MEQ tablet Take 20 mEq by mouth 3 (three) times daily.      [provider]  predniSONE (DELTASONE) 10 MG tablet Begin taking 6 tablets daily, taper by one tablet every other day until off the medication. 12/20/16   Kathrynn Ducking, MD  sertraline (ZOLOFT) 50 MG tablet Take 50 mg by mouth daily. One and one half tablet once a day in morning.    [provider]  Skin Protectants, Misc. (WHITE PETROLATUM-ZINC OXIDE) cream Apply topically as needed. 04/06/17   Lysbeth Penner, FNP  tolterodine (DETROL LA) 2 MG 24 hr capsule TAKE 1 CAPSULE BY MOUTH AT BEDTIME 03/07/17   Kathrynn Ducking, MD  traMADol (ULTRAM) 50 MG tablet TAKE 1 TABLET BY MOUTH EVERY 4-6 AS NEEDED MUST LAST 28 DAYS 12/26/16   Kathrynn Ducking, MD  Wound Dressings (TEGADERM AG MESH 4"X5") PADS Apply 1 each topically 1 day or 1 dose. 04/06/17 04/07/17  Lysbeth Penner, FNP   Meds Ordered and Administered this Visit  Medications - No data to display  BP 140/70 (BP Location: Left Arm)   Pulse 74   Temp 98.7 F (37.1 C) (Oral)   Resp 16   SpO2 98%  No data found.   Physical Exam  Constitutional: She appears well-developed and well-nourished.  HENT:  Head: Normocephalic and atraumatic.  Eyes: Conjunctivae and EOM are normal. Pupils are equal, round, and reactive to light.   Neck: Normal range of motion. Neck supple.  Cardiovascular: Normal rate, regular rhythm and normal heart sounds.   Pulmonary/Chest: Effort normal and breath sounds normal.  Skin: Rash noted.  Right buttock with approx 3 cm diameter breakdown stage one decubitus wound.  Nursing note and vitals reviewed.   Urgent Care Course     Procedures (including critical care time)  Labs Review Labs Reviewed - No data to display  Imaging Review No results found.   Visual Acuity Review  Right Eye Distance:  Left Eye Distance:   Bilateral Distance:    Right Eye Near:   Left Eye Near:    Bilateral Near:         MDM   1. Decubitus ulcer of elbow, stage 1, unspecified laterality    Tegaderm Zinc oxide Aloe Vesta cream  Explained to mix zinc oxide and Aloe vesta cream and apply bid  Explained to apply the Tegaderm patch on weekly and follow up with PCP       Lysbeth Penner, [place] 04/06/17 1254

## 2017-04-06 NOTE — Discharge Instructions (Signed)
Follow up with PCP

## 2017-04-07 DIAGNOSIS — G500 Trigeminal neuralgia: Secondary | ICD-10-CM | POA: Diagnosis not present

## 2017-04-07 DIAGNOSIS — I82402 Acute embolism and thrombosis of unspecified deep veins of left lower extremity: Secondary | ICD-10-CM | POA: Diagnosis not present

## 2017-04-07 DIAGNOSIS — G629 Polyneuropathy, unspecified: Secondary | ICD-10-CM | POA: Diagnosis not present

## 2017-04-07 DIAGNOSIS — G35 Multiple sclerosis: Secondary | ICD-10-CM | POA: Diagnosis not present

## 2017-04-07 DIAGNOSIS — L0231 Cutaneous abscess of buttock: Secondary | ICD-10-CM | POA: Diagnosis not present

## 2017-04-07 DIAGNOSIS — I10 Essential (primary) hypertension: Secondary | ICD-10-CM | POA: Diagnosis not present

## 2017-04-11 DIAGNOSIS — L0231 Cutaneous abscess of buttock: Secondary | ICD-10-CM | POA: Diagnosis not present

## 2017-04-11 DIAGNOSIS — G35 Multiple sclerosis: Secondary | ICD-10-CM | POA: Diagnosis not present

## 2017-04-11 DIAGNOSIS — G500 Trigeminal neuralgia: Secondary | ICD-10-CM | POA: Diagnosis not present

## 2017-04-11 DIAGNOSIS — I82402 Acute embolism and thrombosis of unspecified deep veins of left lower extremity: Secondary | ICD-10-CM | POA: Diagnosis not present

## 2017-04-11 DIAGNOSIS — I10 Essential (primary) hypertension: Secondary | ICD-10-CM | POA: Diagnosis not present

## 2017-04-11 DIAGNOSIS — G629 Polyneuropathy, unspecified: Secondary | ICD-10-CM | POA: Diagnosis not present

## 2017-04-13 ENCOUNTER — Telehealth: Payer: Self-pay | Admitting: Neurology

## 2017-04-13 DIAGNOSIS — I10 Essential (primary) hypertension: Secondary | ICD-10-CM | POA: Diagnosis not present

## 2017-04-13 DIAGNOSIS — G500 Trigeminal neuralgia: Secondary | ICD-10-CM | POA: Diagnosis not present

## 2017-04-13 DIAGNOSIS — L0231 Cutaneous abscess of buttock: Secondary | ICD-10-CM | POA: Diagnosis not present

## 2017-04-13 DIAGNOSIS — G629 Polyneuropathy, unspecified: Secondary | ICD-10-CM | POA: Diagnosis not present

## 2017-04-13 DIAGNOSIS — I82402 Acute embolism and thrombosis of unspecified deep veins of left lower extremity: Secondary | ICD-10-CM | POA: Diagnosis not present

## 2017-04-13 DIAGNOSIS — G35 Multiple sclerosis: Secondary | ICD-10-CM | POA: Diagnosis not present

## 2017-04-13 NOTE — Telephone Encounter (Signed)
Patients son called and said his mother woke up this morning not feeling well. She is experiencing extreme fatigue and having a hard time getting around. He would like to speak with someone regarding this. Please call and advise.

## 2017-04-13 NOTE — Telephone Encounter (Signed)
Rn call patients son doug on dpr. [name] stated his mom has not been feeling good when she woke up and being fatigue. [name] stated his mom is in the chair eating a bagel. He stated after he gave her some water she began to feel better. Rn ask patients son if his mom walks around with a dme assistance and if she is staying hydrated. [name] stated his mom does not walk by herself. His mom has some pressure sores that are being treated by the Rn at the house. He uses a gait belt, and assist her to the bathroom. Pts son stated he tries to get her out of the bed throughout the day. Rn recommend pt stay safe, avoid falling.Also eat regular meals throughout the day and stay hydrated. Pts son ask his mom was she feeling better while on the phone. Pt replied to son she is feeling a little better. Rn advised son to call back if he has any other issues. Pts son verbalized all of the recommendations.

## 2017-04-14 DIAGNOSIS — G629 Polyneuropathy, unspecified: Secondary | ICD-10-CM | POA: Diagnosis not present

## 2017-04-14 DIAGNOSIS — I10 Essential (primary) hypertension: Secondary | ICD-10-CM | POA: Diagnosis not present

## 2017-04-14 DIAGNOSIS — I82402 Acute embolism and thrombosis of unspecified deep veins of left lower extremity: Secondary | ICD-10-CM | POA: Diagnosis not present

## 2017-04-14 DIAGNOSIS — L0231 Cutaneous abscess of buttock: Secondary | ICD-10-CM | POA: Diagnosis not present

## 2017-04-14 DIAGNOSIS — G500 Trigeminal neuralgia: Secondary | ICD-10-CM | POA: Diagnosis not present

## 2017-04-14 DIAGNOSIS — G35 Multiple sclerosis: Secondary | ICD-10-CM | POA: Diagnosis not present

## 2017-04-16 DIAGNOSIS — G629 Polyneuropathy, unspecified: Secondary | ICD-10-CM | POA: Diagnosis not present

## 2017-04-16 DIAGNOSIS — L0231 Cutaneous abscess of buttock: Secondary | ICD-10-CM | POA: Diagnosis not present

## 2017-04-16 DIAGNOSIS — I82402 Acute embolism and thrombosis of unspecified deep veins of left lower extremity: Secondary | ICD-10-CM | POA: Diagnosis not present

## 2017-04-16 DIAGNOSIS — I10 Essential (primary) hypertension: Secondary | ICD-10-CM | POA: Diagnosis not present

## 2017-04-16 DIAGNOSIS — G500 Trigeminal neuralgia: Secondary | ICD-10-CM | POA: Diagnosis not present

## 2017-04-16 DIAGNOSIS — G35 Multiple sclerosis: Secondary | ICD-10-CM | POA: Diagnosis not present

## 2017-04-17 DIAGNOSIS — E7800 Pure hypercholesterolemia, unspecified: Secondary | ICD-10-CM | POA: Diagnosis not present

## 2017-04-17 DIAGNOSIS — L89152 Pressure ulcer of sacral region, stage 2: Secondary | ICD-10-CM | POA: Diagnosis not present

## 2017-04-17 DIAGNOSIS — F325 Major depressive disorder, single episode, in full remission: Secondary | ICD-10-CM | POA: Diagnosis not present

## 2017-04-17 DIAGNOSIS — G35 Multiple sclerosis: Secondary | ICD-10-CM | POA: Diagnosis not present

## 2017-04-17 DIAGNOSIS — I10 Essential (primary) hypertension: Secondary | ICD-10-CM | POA: Diagnosis not present

## 2017-04-17 DIAGNOSIS — F419 Anxiety disorder, unspecified: Secondary | ICD-10-CM | POA: Diagnosis not present

## 2017-04-18 DIAGNOSIS — L0231 Cutaneous abscess of buttock: Secondary | ICD-10-CM | POA: Diagnosis not present

## 2017-04-18 DIAGNOSIS — G500 Trigeminal neuralgia: Secondary | ICD-10-CM | POA: Diagnosis not present

## 2017-04-18 DIAGNOSIS — G629 Polyneuropathy, unspecified: Secondary | ICD-10-CM | POA: Diagnosis not present

## 2017-04-18 DIAGNOSIS — I10 Essential (primary) hypertension: Secondary | ICD-10-CM | POA: Diagnosis not present

## 2017-04-18 DIAGNOSIS — I82402 Acute embolism and thrombosis of unspecified deep veins of left lower extremity: Secondary | ICD-10-CM | POA: Diagnosis not present

## 2017-04-18 DIAGNOSIS — G35 Multiple sclerosis: Secondary | ICD-10-CM | POA: Diagnosis not present

## 2017-04-19 DIAGNOSIS — I10 Essential (primary) hypertension: Secondary | ICD-10-CM | POA: Diagnosis not present

## 2017-04-19 DIAGNOSIS — G35 Multiple sclerosis: Secondary | ICD-10-CM | POA: Diagnosis not present

## 2017-04-19 DIAGNOSIS — I82402 Acute embolism and thrombosis of unspecified deep veins of left lower extremity: Secondary | ICD-10-CM | POA: Diagnosis not present

## 2017-04-19 DIAGNOSIS — L0231 Cutaneous abscess of buttock: Secondary | ICD-10-CM | POA: Diagnosis not present

## 2017-04-19 DIAGNOSIS — G629 Polyneuropathy, unspecified: Secondary | ICD-10-CM | POA: Diagnosis not present

## 2017-04-19 DIAGNOSIS — G500 Trigeminal neuralgia: Secondary | ICD-10-CM | POA: Diagnosis not present

## 2017-04-20 DIAGNOSIS — G629 Polyneuropathy, unspecified: Secondary | ICD-10-CM | POA: Diagnosis not present

## 2017-04-20 DIAGNOSIS — L0231 Cutaneous abscess of buttock: Secondary | ICD-10-CM | POA: Diagnosis not present

## 2017-04-20 DIAGNOSIS — G35 Multiple sclerosis: Secondary | ICD-10-CM | POA: Diagnosis not present

## 2017-04-20 DIAGNOSIS — I10 Essential (primary) hypertension: Secondary | ICD-10-CM | POA: Diagnosis not present

## 2017-04-20 DIAGNOSIS — I82402 Acute embolism and thrombosis of unspecified deep veins of left lower extremity: Secondary | ICD-10-CM | POA: Diagnosis not present

## 2017-04-20 DIAGNOSIS — G500 Trigeminal neuralgia: Secondary | ICD-10-CM | POA: Diagnosis not present

## 2017-04-21 DIAGNOSIS — I10 Essential (primary) hypertension: Secondary | ICD-10-CM | POA: Diagnosis not present

## 2017-04-21 DIAGNOSIS — G500 Trigeminal neuralgia: Secondary | ICD-10-CM | POA: Diagnosis not present

## 2017-04-21 DIAGNOSIS — I82402 Acute embolism and thrombosis of unspecified deep veins of left lower extremity: Secondary | ICD-10-CM | POA: Diagnosis not present

## 2017-04-21 DIAGNOSIS — G35 Multiple sclerosis: Secondary | ICD-10-CM | POA: Diagnosis not present

## 2017-04-21 DIAGNOSIS — L0231 Cutaneous abscess of buttock: Secondary | ICD-10-CM | POA: Diagnosis not present

## 2017-04-21 DIAGNOSIS — G629 Polyneuropathy, unspecified: Secondary | ICD-10-CM | POA: Diagnosis not present

## 2017-04-24 DIAGNOSIS — I10 Essential (primary) hypertension: Secondary | ICD-10-CM | POA: Diagnosis not present

## 2017-04-24 DIAGNOSIS — I82402 Acute embolism and thrombosis of unspecified deep veins of left lower extremity: Secondary | ICD-10-CM | POA: Diagnosis not present

## 2017-04-24 DIAGNOSIS — G500 Trigeminal neuralgia: Secondary | ICD-10-CM | POA: Diagnosis not present

## 2017-04-24 DIAGNOSIS — L0231 Cutaneous abscess of buttock: Secondary | ICD-10-CM | POA: Diagnosis not present

## 2017-04-24 DIAGNOSIS — G629 Polyneuropathy, unspecified: Secondary | ICD-10-CM | POA: Diagnosis not present

## 2017-04-24 DIAGNOSIS — G35 Multiple sclerosis: Secondary | ICD-10-CM | POA: Diagnosis not present

## 2017-04-25 DIAGNOSIS — G35 Multiple sclerosis: Secondary | ICD-10-CM | POA: Diagnosis not present

## 2017-04-25 DIAGNOSIS — G629 Polyneuropathy, unspecified: Secondary | ICD-10-CM | POA: Diagnosis not present

## 2017-04-25 DIAGNOSIS — L0231 Cutaneous abscess of buttock: Secondary | ICD-10-CM | POA: Diagnosis not present

## 2017-04-25 DIAGNOSIS — I10 Essential (primary) hypertension: Secondary | ICD-10-CM | POA: Diagnosis not present

## 2017-04-25 DIAGNOSIS — G500 Trigeminal neuralgia: Secondary | ICD-10-CM | POA: Diagnosis not present

## 2017-04-25 DIAGNOSIS — I82402 Acute embolism and thrombosis of unspecified deep veins of left lower extremity: Secondary | ICD-10-CM | POA: Diagnosis not present

## 2017-04-26 DIAGNOSIS — I10 Essential (primary) hypertension: Secondary | ICD-10-CM | POA: Diagnosis not present

## 2017-04-26 DIAGNOSIS — G500 Trigeminal neuralgia: Secondary | ICD-10-CM | POA: Diagnosis not present

## 2017-04-26 DIAGNOSIS — I82402 Acute embolism and thrombosis of unspecified deep veins of left lower extremity: Secondary | ICD-10-CM | POA: Diagnosis not present

## 2017-04-26 DIAGNOSIS — L0231 Cutaneous abscess of buttock: Secondary | ICD-10-CM | POA: Diagnosis not present

## 2017-04-26 DIAGNOSIS — G629 Polyneuropathy, unspecified: Secondary | ICD-10-CM | POA: Diagnosis not present

## 2017-04-26 DIAGNOSIS — G35 Multiple sclerosis: Secondary | ICD-10-CM | POA: Diagnosis not present

## 2017-04-27 DIAGNOSIS — G500 Trigeminal neuralgia: Secondary | ICD-10-CM | POA: Diagnosis not present

## 2017-04-27 DIAGNOSIS — L0231 Cutaneous abscess of buttock: Secondary | ICD-10-CM | POA: Diagnosis not present

## 2017-04-27 DIAGNOSIS — I82402 Acute embolism and thrombosis of unspecified deep veins of left lower extremity: Secondary | ICD-10-CM | POA: Diagnosis not present

## 2017-04-27 DIAGNOSIS — I10 Essential (primary) hypertension: Secondary | ICD-10-CM | POA: Diagnosis not present

## 2017-04-27 DIAGNOSIS — G629 Polyneuropathy, unspecified: Secondary | ICD-10-CM | POA: Diagnosis not present

## 2017-04-27 DIAGNOSIS — G35 Multiple sclerosis: Secondary | ICD-10-CM | POA: Diagnosis not present

## 2017-04-28 DIAGNOSIS — L0231 Cutaneous abscess of buttock: Secondary | ICD-10-CM | POA: Diagnosis not present

## 2017-04-28 DIAGNOSIS — G629 Polyneuropathy, unspecified: Secondary | ICD-10-CM | POA: Diagnosis not present

## 2017-04-28 DIAGNOSIS — I10 Essential (primary) hypertension: Secondary | ICD-10-CM | POA: Diagnosis not present

## 2017-04-28 DIAGNOSIS — I82402 Acute embolism and thrombosis of unspecified deep veins of left lower extremity: Secondary | ICD-10-CM | POA: Diagnosis not present

## 2017-04-28 DIAGNOSIS — G500 Trigeminal neuralgia: Secondary | ICD-10-CM | POA: Diagnosis not present

## 2017-04-28 DIAGNOSIS — G35 Multiple sclerosis: Secondary | ICD-10-CM | POA: Diagnosis not present

## 2017-05-01 DIAGNOSIS — G35 Multiple sclerosis: Secondary | ICD-10-CM | POA: Diagnosis not present

## 2017-05-01 DIAGNOSIS — L0231 Cutaneous abscess of buttock: Secondary | ICD-10-CM | POA: Diagnosis not present

## 2017-05-01 DIAGNOSIS — G500 Trigeminal neuralgia: Secondary | ICD-10-CM | POA: Diagnosis not present

## 2017-05-01 DIAGNOSIS — I10 Essential (primary) hypertension: Secondary | ICD-10-CM | POA: Diagnosis not present

## 2017-05-01 DIAGNOSIS — G629 Polyneuropathy, unspecified: Secondary | ICD-10-CM | POA: Diagnosis not present

## 2017-05-01 DIAGNOSIS — I82402 Acute embolism and thrombosis of unspecified deep veins of left lower extremity: Secondary | ICD-10-CM | POA: Diagnosis not present

## 2017-05-03 DIAGNOSIS — G629 Polyneuropathy, unspecified: Secondary | ICD-10-CM | POA: Diagnosis not present

## 2017-05-03 DIAGNOSIS — G500 Trigeminal neuralgia: Secondary | ICD-10-CM | POA: Diagnosis not present

## 2017-05-03 DIAGNOSIS — G35 Multiple sclerosis: Secondary | ICD-10-CM | POA: Diagnosis not present

## 2017-05-03 DIAGNOSIS — L0231 Cutaneous abscess of buttock: Secondary | ICD-10-CM | POA: Diagnosis not present

## 2017-05-03 DIAGNOSIS — I82402 Acute embolism and thrombosis of unspecified deep veins of left lower extremity: Secondary | ICD-10-CM | POA: Diagnosis not present

## 2017-05-03 DIAGNOSIS — I10 Essential (primary) hypertension: Secondary | ICD-10-CM | POA: Diagnosis not present

## 2017-05-05 DIAGNOSIS — G629 Polyneuropathy, unspecified: Secondary | ICD-10-CM | POA: Diagnosis not present

## 2017-05-05 DIAGNOSIS — G35 Multiple sclerosis: Secondary | ICD-10-CM | POA: Diagnosis not present

## 2017-05-05 DIAGNOSIS — G500 Trigeminal neuralgia: Secondary | ICD-10-CM | POA: Diagnosis not present

## 2017-05-05 DIAGNOSIS — L0231 Cutaneous abscess of buttock: Secondary | ICD-10-CM | POA: Diagnosis not present

## 2017-05-05 DIAGNOSIS — I82402 Acute embolism and thrombosis of unspecified deep veins of left lower extremity: Secondary | ICD-10-CM | POA: Diagnosis not present

## 2017-05-05 DIAGNOSIS — I10 Essential (primary) hypertension: Secondary | ICD-10-CM | POA: Diagnosis not present

## 2017-05-08 DIAGNOSIS — G500 Trigeminal neuralgia: Secondary | ICD-10-CM | POA: Diagnosis not present

## 2017-05-08 DIAGNOSIS — G35 Multiple sclerosis: Secondary | ICD-10-CM | POA: Diagnosis not present

## 2017-05-08 DIAGNOSIS — I82402 Acute embolism and thrombosis of unspecified deep veins of left lower extremity: Secondary | ICD-10-CM | POA: Diagnosis not present

## 2017-05-08 DIAGNOSIS — G629 Polyneuropathy, unspecified: Secondary | ICD-10-CM | POA: Diagnosis not present

## 2017-05-08 DIAGNOSIS — I10 Essential (primary) hypertension: Secondary | ICD-10-CM | POA: Diagnosis not present

## 2017-05-08 DIAGNOSIS — L0231 Cutaneous abscess of buttock: Secondary | ICD-10-CM | POA: Diagnosis not present

## 2017-05-09 DIAGNOSIS — I10 Essential (primary) hypertension: Secondary | ICD-10-CM | POA: Diagnosis not present

## 2017-05-09 DIAGNOSIS — G629 Polyneuropathy, unspecified: Secondary | ICD-10-CM | POA: Diagnosis not present

## 2017-05-09 DIAGNOSIS — G35 Multiple sclerosis: Secondary | ICD-10-CM | POA: Diagnosis not present

## 2017-05-09 DIAGNOSIS — L0231 Cutaneous abscess of buttock: Secondary | ICD-10-CM | POA: Diagnosis not present

## 2017-05-09 DIAGNOSIS — I82402 Acute embolism and thrombosis of unspecified deep veins of left lower extremity: Secondary | ICD-10-CM | POA: Diagnosis not present

## 2017-05-09 DIAGNOSIS — G500 Trigeminal neuralgia: Secondary | ICD-10-CM | POA: Diagnosis not present

## 2017-05-10 DIAGNOSIS — G500 Trigeminal neuralgia: Secondary | ICD-10-CM | POA: Diagnosis not present

## 2017-05-10 DIAGNOSIS — G35 Multiple sclerosis: Secondary | ICD-10-CM | POA: Diagnosis not present

## 2017-05-10 DIAGNOSIS — L0231 Cutaneous abscess of buttock: Secondary | ICD-10-CM | POA: Diagnosis not present

## 2017-05-10 DIAGNOSIS — I10 Essential (primary) hypertension: Secondary | ICD-10-CM | POA: Diagnosis not present

## 2017-05-10 DIAGNOSIS — G629 Polyneuropathy, unspecified: Secondary | ICD-10-CM | POA: Diagnosis not present

## 2017-05-10 DIAGNOSIS — I82402 Acute embolism and thrombosis of unspecified deep veins of left lower extremity: Secondary | ICD-10-CM | POA: Diagnosis not present

## 2017-05-11 DIAGNOSIS — G500 Trigeminal neuralgia: Secondary | ICD-10-CM | POA: Diagnosis not present

## 2017-05-11 DIAGNOSIS — G629 Polyneuropathy, unspecified: Secondary | ICD-10-CM | POA: Diagnosis not present

## 2017-05-11 DIAGNOSIS — I10 Essential (primary) hypertension: Secondary | ICD-10-CM | POA: Diagnosis not present

## 2017-05-11 DIAGNOSIS — L0231 Cutaneous abscess of buttock: Secondary | ICD-10-CM | POA: Diagnosis not present

## 2017-05-11 DIAGNOSIS — I82402 Acute embolism and thrombosis of unspecified deep veins of left lower extremity: Secondary | ICD-10-CM | POA: Diagnosis not present

## 2017-05-11 DIAGNOSIS — G35 Multiple sclerosis: Secondary | ICD-10-CM | POA: Diagnosis not present

## 2017-05-12 DIAGNOSIS — G629 Polyneuropathy, unspecified: Secondary | ICD-10-CM | POA: Diagnosis not present

## 2017-05-12 DIAGNOSIS — G500 Trigeminal neuralgia: Secondary | ICD-10-CM | POA: Diagnosis not present

## 2017-05-12 DIAGNOSIS — L0231 Cutaneous abscess of buttock: Secondary | ICD-10-CM | POA: Diagnosis not present

## 2017-05-12 DIAGNOSIS — G35 Multiple sclerosis: Secondary | ICD-10-CM | POA: Diagnosis not present

## 2017-05-12 DIAGNOSIS — I10 Essential (primary) hypertension: Secondary | ICD-10-CM | POA: Diagnosis not present

## 2017-05-12 DIAGNOSIS — I82402 Acute embolism and thrombosis of unspecified deep veins of left lower extremity: Secondary | ICD-10-CM | POA: Diagnosis not present

## 2017-05-16 DIAGNOSIS — I10 Essential (primary) hypertension: Secondary | ICD-10-CM | POA: Diagnosis not present

## 2017-05-16 DIAGNOSIS — G629 Polyneuropathy, unspecified: Secondary | ICD-10-CM | POA: Diagnosis not present

## 2017-05-16 DIAGNOSIS — L0231 Cutaneous abscess of buttock: Secondary | ICD-10-CM | POA: Diagnosis not present

## 2017-05-16 DIAGNOSIS — G500 Trigeminal neuralgia: Secondary | ICD-10-CM | POA: Diagnosis not present

## 2017-05-16 DIAGNOSIS — I82402 Acute embolism and thrombosis of unspecified deep veins of left lower extremity: Secondary | ICD-10-CM | POA: Diagnosis not present

## 2017-05-16 DIAGNOSIS — G35 Multiple sclerosis: Secondary | ICD-10-CM | POA: Diagnosis not present

## 2017-05-18 DIAGNOSIS — I10 Essential (primary) hypertension: Secondary | ICD-10-CM | POA: Diagnosis not present

## 2017-05-18 DIAGNOSIS — L0231 Cutaneous abscess of buttock: Secondary | ICD-10-CM | POA: Diagnosis not present

## 2017-05-18 DIAGNOSIS — G500 Trigeminal neuralgia: Secondary | ICD-10-CM | POA: Diagnosis not present

## 2017-05-18 DIAGNOSIS — G629 Polyneuropathy, unspecified: Secondary | ICD-10-CM | POA: Diagnosis not present

## 2017-05-18 DIAGNOSIS — G35 Multiple sclerosis: Secondary | ICD-10-CM | POA: Diagnosis not present

## 2017-05-18 DIAGNOSIS — I82402 Acute embolism and thrombosis of unspecified deep veins of left lower extremity: Secondary | ICD-10-CM | POA: Diagnosis not present

## 2017-05-19 DIAGNOSIS — I10 Essential (primary) hypertension: Secondary | ICD-10-CM | POA: Diagnosis not present

## 2017-05-19 DIAGNOSIS — I82402 Acute embolism and thrombosis of unspecified deep veins of left lower extremity: Secondary | ICD-10-CM | POA: Diagnosis not present

## 2017-05-19 DIAGNOSIS — G35 Multiple sclerosis: Secondary | ICD-10-CM | POA: Diagnosis not present

## 2017-05-19 DIAGNOSIS — G629 Polyneuropathy, unspecified: Secondary | ICD-10-CM | POA: Diagnosis not present

## 2017-05-19 DIAGNOSIS — G500 Trigeminal neuralgia: Secondary | ICD-10-CM | POA: Diagnosis not present

## 2017-05-19 DIAGNOSIS — L0231 Cutaneous abscess of buttock: Secondary | ICD-10-CM | POA: Diagnosis not present

## 2017-05-22 DIAGNOSIS — L0231 Cutaneous abscess of buttock: Secondary | ICD-10-CM | POA: Diagnosis not present

## 2017-05-22 DIAGNOSIS — I82402 Acute embolism and thrombosis of unspecified deep veins of left lower extremity: Secondary | ICD-10-CM | POA: Diagnosis not present

## 2017-05-22 DIAGNOSIS — G35 Multiple sclerosis: Secondary | ICD-10-CM | POA: Diagnosis not present

## 2017-05-22 DIAGNOSIS — G500 Trigeminal neuralgia: Secondary | ICD-10-CM | POA: Diagnosis not present

## 2017-05-22 DIAGNOSIS — I10 Essential (primary) hypertension: Secondary | ICD-10-CM | POA: Diagnosis not present

## 2017-05-22 DIAGNOSIS — G629 Polyneuropathy, unspecified: Secondary | ICD-10-CM | POA: Diagnosis not present

## 2017-05-23 DIAGNOSIS — L0231 Cutaneous abscess of buttock: Secondary | ICD-10-CM | POA: Diagnosis not present

## 2017-05-23 DIAGNOSIS — G629 Polyneuropathy, unspecified: Secondary | ICD-10-CM | POA: Diagnosis not present

## 2017-05-23 DIAGNOSIS — I82402 Acute embolism and thrombosis of unspecified deep veins of left lower extremity: Secondary | ICD-10-CM | POA: Diagnosis not present

## 2017-05-23 DIAGNOSIS — G35 Multiple sclerosis: Secondary | ICD-10-CM | POA: Diagnosis not present

## 2017-05-23 DIAGNOSIS — G500 Trigeminal neuralgia: Secondary | ICD-10-CM | POA: Diagnosis not present

## 2017-05-23 DIAGNOSIS — I10 Essential (primary) hypertension: Secondary | ICD-10-CM | POA: Diagnosis not present

## 2017-05-24 DIAGNOSIS — I82402 Acute embolism and thrombosis of unspecified deep veins of left lower extremity: Secondary | ICD-10-CM | POA: Diagnosis not present

## 2017-05-24 DIAGNOSIS — G35 Multiple sclerosis: Secondary | ICD-10-CM | POA: Diagnosis not present

## 2017-05-24 DIAGNOSIS — G500 Trigeminal neuralgia: Secondary | ICD-10-CM | POA: Diagnosis not present

## 2017-05-24 DIAGNOSIS — G629 Polyneuropathy, unspecified: Secondary | ICD-10-CM | POA: Diagnosis not present

## 2017-05-24 DIAGNOSIS — L0231 Cutaneous abscess of buttock: Secondary | ICD-10-CM | POA: Diagnosis not present

## 2017-05-24 DIAGNOSIS — I10 Essential (primary) hypertension: Secondary | ICD-10-CM | POA: Diagnosis not present

## 2017-05-25 DIAGNOSIS — I10 Essential (primary) hypertension: Secondary | ICD-10-CM | POA: Diagnosis not present

## 2017-05-25 DIAGNOSIS — L0231 Cutaneous abscess of buttock: Secondary | ICD-10-CM | POA: Diagnosis not present

## 2017-05-25 DIAGNOSIS — G35 Multiple sclerosis: Secondary | ICD-10-CM | POA: Diagnosis not present

## 2017-05-25 DIAGNOSIS — I82402 Acute embolism and thrombosis of unspecified deep veins of left lower extremity: Secondary | ICD-10-CM | POA: Diagnosis not present

## 2017-05-25 DIAGNOSIS — G500 Trigeminal neuralgia: Secondary | ICD-10-CM | POA: Diagnosis not present

## 2017-05-25 DIAGNOSIS — G629 Polyneuropathy, unspecified: Secondary | ICD-10-CM | POA: Diagnosis not present

## 2017-05-26 DIAGNOSIS — L0231 Cutaneous abscess of buttock: Secondary | ICD-10-CM | POA: Diagnosis not present

## 2017-05-26 DIAGNOSIS — G500 Trigeminal neuralgia: Secondary | ICD-10-CM | POA: Diagnosis not present

## 2017-05-26 DIAGNOSIS — I82402 Acute embolism and thrombosis of unspecified deep veins of left lower extremity: Secondary | ICD-10-CM | POA: Diagnosis not present

## 2017-05-26 DIAGNOSIS — G629 Polyneuropathy, unspecified: Secondary | ICD-10-CM | POA: Diagnosis not present

## 2017-05-26 DIAGNOSIS — I10 Essential (primary) hypertension: Secondary | ICD-10-CM | POA: Diagnosis not present

## 2017-05-26 DIAGNOSIS — G35 Multiple sclerosis: Secondary | ICD-10-CM | POA: Diagnosis not present

## 2017-05-29 DIAGNOSIS — G500 Trigeminal neuralgia: Secondary | ICD-10-CM | POA: Diagnosis not present

## 2017-05-29 DIAGNOSIS — I82402 Acute embolism and thrombosis of unspecified deep veins of left lower extremity: Secondary | ICD-10-CM | POA: Diagnosis not present

## 2017-05-29 DIAGNOSIS — I10 Essential (primary) hypertension: Secondary | ICD-10-CM | POA: Diagnosis not present

## 2017-05-29 DIAGNOSIS — L0231 Cutaneous abscess of buttock: Secondary | ICD-10-CM | POA: Diagnosis not present

## 2017-05-29 DIAGNOSIS — G629 Polyneuropathy, unspecified: Secondary | ICD-10-CM | POA: Diagnosis not present

## 2017-05-29 DIAGNOSIS — G35 Multiple sclerosis: Secondary | ICD-10-CM | POA: Diagnosis not present

## 2017-05-30 DIAGNOSIS — G500 Trigeminal neuralgia: Secondary | ICD-10-CM | POA: Diagnosis not present

## 2017-05-30 DIAGNOSIS — G35 Multiple sclerosis: Secondary | ICD-10-CM | POA: Diagnosis not present

## 2017-05-30 DIAGNOSIS — I82402 Acute embolism and thrombosis of unspecified deep veins of left lower extremity: Secondary | ICD-10-CM | POA: Diagnosis not present

## 2017-05-30 DIAGNOSIS — L0231 Cutaneous abscess of buttock: Secondary | ICD-10-CM | POA: Diagnosis not present

## 2017-05-30 DIAGNOSIS — I10 Essential (primary) hypertension: Secondary | ICD-10-CM | POA: Diagnosis not present

## 2017-05-30 DIAGNOSIS — G629 Polyneuropathy, unspecified: Secondary | ICD-10-CM | POA: Diagnosis not present

## 2017-06-01 DIAGNOSIS — I82402 Acute embolism and thrombosis of unspecified deep veins of left lower extremity: Secondary | ICD-10-CM | POA: Diagnosis not present

## 2017-06-01 DIAGNOSIS — L0231 Cutaneous abscess of buttock: Secondary | ICD-10-CM | POA: Diagnosis not present

## 2017-06-01 DIAGNOSIS — I10 Essential (primary) hypertension: Secondary | ICD-10-CM | POA: Diagnosis not present

## 2017-06-01 DIAGNOSIS — G500 Trigeminal neuralgia: Secondary | ICD-10-CM | POA: Diagnosis not present

## 2017-06-01 DIAGNOSIS — G35 Multiple sclerosis: Secondary | ICD-10-CM | POA: Diagnosis not present

## 2017-06-01 DIAGNOSIS — G629 Polyneuropathy, unspecified: Secondary | ICD-10-CM | POA: Diagnosis not present

## 2017-06-02 DIAGNOSIS — G629 Polyneuropathy, unspecified: Secondary | ICD-10-CM | POA: Diagnosis not present

## 2017-06-02 DIAGNOSIS — L0231 Cutaneous abscess of buttock: Secondary | ICD-10-CM | POA: Diagnosis not present

## 2017-06-02 DIAGNOSIS — I10 Essential (primary) hypertension: Secondary | ICD-10-CM | POA: Diagnosis not present

## 2017-06-02 DIAGNOSIS — I82402 Acute embolism and thrombosis of unspecified deep veins of left lower extremity: Secondary | ICD-10-CM | POA: Diagnosis not present

## 2017-06-02 DIAGNOSIS — G500 Trigeminal neuralgia: Secondary | ICD-10-CM | POA: Diagnosis not present

## 2017-06-02 DIAGNOSIS — G35 Multiple sclerosis: Secondary | ICD-10-CM | POA: Diagnosis not present

## 2017-06-05 DIAGNOSIS — I10 Essential (primary) hypertension: Secondary | ICD-10-CM | POA: Diagnosis not present

## 2017-06-05 DIAGNOSIS — G35 Multiple sclerosis: Secondary | ICD-10-CM | POA: Diagnosis not present

## 2017-06-05 DIAGNOSIS — L0231 Cutaneous abscess of buttock: Secondary | ICD-10-CM | POA: Diagnosis not present

## 2017-06-05 DIAGNOSIS — G629 Polyneuropathy, unspecified: Secondary | ICD-10-CM | POA: Diagnosis not present

## 2017-06-05 DIAGNOSIS — G500 Trigeminal neuralgia: Secondary | ICD-10-CM | POA: Diagnosis not present

## 2017-06-05 DIAGNOSIS — I82402 Acute embolism and thrombosis of unspecified deep veins of left lower extremity: Secondary | ICD-10-CM | POA: Diagnosis not present

## 2017-06-09 DIAGNOSIS — L0231 Cutaneous abscess of buttock: Secondary | ICD-10-CM | POA: Diagnosis not present

## 2017-06-09 DIAGNOSIS — I82402 Acute embolism and thrombosis of unspecified deep veins of left lower extremity: Secondary | ICD-10-CM | POA: Diagnosis not present

## 2017-06-09 DIAGNOSIS — G35 Multiple sclerosis: Secondary | ICD-10-CM | POA: Diagnosis not present

## 2017-06-09 DIAGNOSIS — G629 Polyneuropathy, unspecified: Secondary | ICD-10-CM | POA: Diagnosis not present

## 2017-06-09 DIAGNOSIS — G500 Trigeminal neuralgia: Secondary | ICD-10-CM | POA: Diagnosis not present

## 2017-06-09 DIAGNOSIS — I10 Essential (primary) hypertension: Secondary | ICD-10-CM | POA: Diagnosis not present

## 2017-06-12 DIAGNOSIS — L0231 Cutaneous abscess of buttock: Secondary | ICD-10-CM | POA: Diagnosis not present

## 2017-06-12 DIAGNOSIS — G629 Polyneuropathy, unspecified: Secondary | ICD-10-CM | POA: Diagnosis not present

## 2017-06-12 DIAGNOSIS — G35 Multiple sclerosis: Secondary | ICD-10-CM | POA: Diagnosis not present

## 2017-06-12 DIAGNOSIS — I82402 Acute embolism and thrombosis of unspecified deep veins of left lower extremity: Secondary | ICD-10-CM | POA: Diagnosis not present

## 2017-06-12 DIAGNOSIS — I10 Essential (primary) hypertension: Secondary | ICD-10-CM | POA: Diagnosis not present

## 2017-06-12 DIAGNOSIS — G500 Trigeminal neuralgia: Secondary | ICD-10-CM | POA: Diagnosis not present

## 2017-06-16 DIAGNOSIS — G629 Polyneuropathy, unspecified: Secondary | ICD-10-CM | POA: Diagnosis not present

## 2017-06-16 DIAGNOSIS — I82402 Acute embolism and thrombosis of unspecified deep veins of left lower extremity: Secondary | ICD-10-CM | POA: Diagnosis not present

## 2017-06-16 DIAGNOSIS — G35 Multiple sclerosis: Secondary | ICD-10-CM | POA: Diagnosis not present

## 2017-06-16 DIAGNOSIS — I10 Essential (primary) hypertension: Secondary | ICD-10-CM | POA: Diagnosis not present

## 2017-06-16 DIAGNOSIS — L0231 Cutaneous abscess of buttock: Secondary | ICD-10-CM | POA: Diagnosis not present

## 2017-06-16 DIAGNOSIS — G500 Trigeminal neuralgia: Secondary | ICD-10-CM | POA: Diagnosis not present

## 2017-06-18 ENCOUNTER — Other Ambulatory Visit: Payer: Self-pay | Admitting: Neurology

## 2017-06-19 DIAGNOSIS — I82402 Acute embolism and thrombosis of unspecified deep veins of left lower extremity: Secondary | ICD-10-CM | POA: Diagnosis not present

## 2017-06-19 DIAGNOSIS — I10 Essential (primary) hypertension: Secondary | ICD-10-CM | POA: Diagnosis not present

## 2017-06-19 DIAGNOSIS — G500 Trigeminal neuralgia: Secondary | ICD-10-CM | POA: Diagnosis not present

## 2017-06-19 DIAGNOSIS — G629 Polyneuropathy, unspecified: Secondary | ICD-10-CM | POA: Diagnosis not present

## 2017-06-19 DIAGNOSIS — G35 Multiple sclerosis: Secondary | ICD-10-CM | POA: Diagnosis not present

## 2017-06-19 DIAGNOSIS — L0231 Cutaneous abscess of buttock: Secondary | ICD-10-CM | POA: Diagnosis not present

## 2017-06-19 NOTE — Telephone Encounter (Signed)
Faxed printed/signed rx Lyrica to [place], [place] at [phone]. Received fax confirmation.

## 2017-06-21 DIAGNOSIS — G35 Multiple sclerosis: Secondary | ICD-10-CM | POA: Diagnosis not present

## 2017-06-21 DIAGNOSIS — L0231 Cutaneous abscess of buttock: Secondary | ICD-10-CM | POA: Diagnosis not present

## 2017-06-21 DIAGNOSIS — I10 Essential (primary) hypertension: Secondary | ICD-10-CM | POA: Diagnosis not present

## 2017-06-21 DIAGNOSIS — I82402 Acute embolism and thrombosis of unspecified deep veins of left lower extremity: Secondary | ICD-10-CM | POA: Diagnosis not present

## 2017-06-21 DIAGNOSIS — G629 Polyneuropathy, unspecified: Secondary | ICD-10-CM | POA: Diagnosis not present

## 2017-06-21 DIAGNOSIS — G500 Trigeminal neuralgia: Secondary | ICD-10-CM | POA: Diagnosis not present

## 2017-06-27 DIAGNOSIS — I10 Essential (primary) hypertension: Secondary | ICD-10-CM | POA: Diagnosis not present

## 2017-06-27 DIAGNOSIS — L0231 Cutaneous abscess of buttock: Secondary | ICD-10-CM | POA: Diagnosis not present

## 2017-06-27 DIAGNOSIS — G500 Trigeminal neuralgia: Secondary | ICD-10-CM | POA: Diagnosis not present

## 2017-06-27 DIAGNOSIS — I82402 Acute embolism and thrombosis of unspecified deep veins of left lower extremity: Secondary | ICD-10-CM | POA: Diagnosis not present

## 2017-06-27 DIAGNOSIS — G35 Multiple sclerosis: Secondary | ICD-10-CM | POA: Diagnosis not present

## 2017-06-27 DIAGNOSIS — G629 Polyneuropathy, unspecified: Secondary | ICD-10-CM | POA: Diagnosis not present

## 2017-06-29 ENCOUNTER — Encounter: Payer: Self-pay | Admitting: Neurology

## 2017-06-29 ENCOUNTER — Encounter (INDEPENDENT_AMBULATORY_CARE_PROVIDER_SITE_OTHER): Payer: Self-pay

## 2017-06-29 ENCOUNTER — Ambulatory Visit (INDEPENDENT_AMBULATORY_CARE_PROVIDER_SITE_OTHER): Payer: Medicare HMO | Admitting: Neurology

## 2017-06-29 VITALS — BP 135/70 | HR 66

## 2017-06-29 DIAGNOSIS — R269 Unspecified abnormalities of gait and mobility: Secondary | ICD-10-CM

## 2017-06-29 DIAGNOSIS — R413 Other amnesia: Secondary | ICD-10-CM

## 2017-06-29 DIAGNOSIS — N319 Neuromuscular dysfunction of bladder, unspecified: Secondary | ICD-10-CM

## 2017-06-29 DIAGNOSIS — G500 Trigeminal neuralgia: Secondary | ICD-10-CM

## 2017-06-29 DIAGNOSIS — G35 Multiple sclerosis: Secondary | ICD-10-CM

## 2017-06-29 DIAGNOSIS — G35D Multiple sclerosis, unspecified: Secondary | ICD-10-CM

## 2017-06-29 MED ORDER — BACLOFEN 10 MG PO TABS: ORAL_TABLET | ORAL | 3 refills | Status: DC

## 2017-06-29 NOTE — Progress Notes (Signed)
Reason for visit: Multiple sclerosis  [name] is an 71 y.o. female  History of present illness:  [name] is a 71 year old right-handed black female with a history of multiple sclerosis. The patient has end stage features, she has a significant dementia and she is nonambulatory. She has had problems with a pressure sore on her buttocks that is now healing. She has recently completed physical therapy and occupational therapy. She was also seen and evaluated through speech therapy for her swallowing and was felt to be doing well in this regard. She has trigeminal neuralgia on the right but Lyrica has been very effective for treating this. The patient also remains on carbamazepine. She has difficulty with the use of her hands, she may at times require assistance even with feeding. The patient can stand and help some with transfers. She is sleeping well at night. She is on Aricept for memory. She returns for an evaluation.   Past Medical History:  Diagnosis Date  . Abnormality of gait   . Depression   . DVT (deep venous thrombosis) (HCC)    Left leg  . Dyslipidemia   . Fibroids   . Hypertension   . Memory difficulties 12/10/2014  . Multiple sclerosis (HCC)    Right sided weakness and gait disorder, bladder and bowel problems   . Neurogenic bladder   . 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 Overdose of muscle relaxant    Unintentional baclofen overdose   . Trigeminal neuralgia   . Trigeminal neuralgia    Right    Past Surgical History:  Procedure Laterality Date  . gamma knife procedure for rt. trigeminal neuralgia Right   . left leg DVT Left   . STRABISMUS SURGERY     Right eye     Family History  Problem Relation Age of Onset  . Multiple sclerosis Sister   . Cervical cancer Mother   . Liver disease Father   . Hyperlipidemia Brother   . Pancreatitis Brother   . Hypertension Unknown   . Heart disease Unknown   . Uterine cancer Unknown     Social history:   reports that she has never smoked. She has never used smokeless tobacco. She reports that she does not drink alcohol or use drugs.    Allergies  Allergen Reactions  . Prednisone     Had gotten thrush  . Shellfish Allergy     Hives     Medications:  Prior to Admission medications   Medication Sig Start Date End Date Taking? Authorizing Provider  acetaminophen (TYLENOL) 500 MG tablet Take 500 mg by mouth as needed.   Yes [provider]  amLODipine (NORVASC) 10 MG tablet Take 1 tablet (10 mg total) by mouth daily. 11/03/11  Yes Deneise Lever, MD  APPLE CIDER VINEGAR PO Take 1 capsule by mouth daily. 450mg    Yes [provider]  atorvastatin (LIPITOR) 40 MG tablet Take 40 mg by mouth daily.     Yes [provider]  baclofen (LIORESAL) 10 MG tablet One tablet twice during the day and 2 at night 06/29/17  Yes Kathrynn Ducking, MD  BLACK CURRANT SEED OIL PO Take 1 capsule by mouth daily.   Yes [provider]  buPROPion (WELLBUTRIN XL) 300 MG 24 hr tablet Take 300 mg by mouth daily.   Yes Deneise Lever, MD  carbamazepine (TEGRETOL) 200 MG tablet 200mg  tid and 100 at hs 06/16/16  Yes Dennie Bible, NP  Cholecalciferol ([facility] VIT D 5000 HIGH-POTENCY PO) Take 5,000 Int'l Units/L by mouth daily.   Yes [provider]  donepezil (ARICEPT) 5 MG tablet Take 1 tablet (5 mg total) by mouth daily. 12/26/16  Yes Dennie Bible, NP  FIBER SELECT GUMMIES PO Take 5 g by mouth 2 (two) times daily.    Yes [provider]  fish oil-omega-3 fatty acids 1000 MG capsule Take 2 g by mouth 3 (three) times daily.   Yes Deneise Lever, MD  Interferon Beta-1a (AVONEX PEN) 30 MCG/0.5ML AJKT Inject 30 mcg into the muscle every 7 (seven) days. 11/11/16  Yes Kathrynn Ducking, MD  labetalol (NORMODYNE) 100 MG tablet Take 100 mg by mouth 2 (two) times daily.   Yes Deneise Lever, MD  losartan-hydrochlorothiazide [facility]) 100-12.5 MG per tablet  12/09/14   Yes [provider]  LYRICA 100 MG capsule TAKE ONE CAPSULE BY MOUTH TWICE A DAY 06/19/17  Yes Kathrynn Ducking, MD  Multiple Vitamins-Minerals (MULTIVITAMINS THER. W/MINERALS) TABS Take 1 tablet by mouth daily.     Yes [provider]  petrolatum-hydrophilic-aloe vera (ALOE VESTA) ointment Apply topically 2 (two) times daily as needed for wound care. 04/06/17  Yes Lysbeth Penner, FNP  Potassium Chloride (KLOR-CON PO) Take 20 mEq by mouth.   Yes [provider]  potassium chloride SA (K-DUR,KLOR-CON) 20 MEQ tablet Take 20 mEq by mouth 3 (three) times daily.     Yes [provider]  sertraline (ZOLOFT) 50 MG tablet Take 50 mg by mouth daily. One and one half tablet once a day in morning.   Yes [provider]  Skin Protectants, Misc. (WHITE PETROLATUM-ZINC OXIDE) cream Apply topically as needed. 04/06/17  Yes Lysbeth Penner, FNP  tolterodine (DETROL LA) 2 MG 24 hr capsule TAKE 1 CAPSULE BY MOUTH AT BEDTIME 03/07/17  Yes Kathrynn Ducking, MD  traMADol (ULTRAM) 50 MG tablet TAKE 1 TABLET BY MOUTH EVERY 4-6 AS NEEDED MUST LAST 28 DAYS 12/26/16  Yes Kathrynn Ducking, MD    ROS:  Out of a complete 14 system review of symptoms, the patient complains only of the following symptoms, and all other reviewed systems are negative.  Fatigue Memory loss Walking problems  Blood pressure 135/70, pulse 66, SpO2 96 %.  Physical Exam  General: The patient is alert and cooperative at the time of the examination.  Skin: No significant peripheral edema is noted.   Neurologic Exam  Mental status: The patient is alert and oriented x 3 at the time of the examination. The Mini-Mental Status Examination done today shows total score of 18/30.   Cranial nerves: Facial symmetry is present. Speech is normal, no aphasia or dysarthria is noted. Extraocular movements are full. Visual fields are full. Pupils are equal, round, and reactive to light. Discs are flat  bilaterally.  Motor: The patient has good strength in the upper extremities. With the lower extremity is, the patient has difficulty with flexion of the hips bilaterally with 2/5 strength. The patient is unable to extend the right knee, she can skin the left knee, she has bilateral AFO braces on.  Sensory examination: Soft touch sensation is symmetric on the face, arms, and legs.  Coordination: The patient has inability to perform heel-to-shin on either side, she has some ataxia with finger-nose-finger bilaterally.  Gait and station: The patient could not be ambulated, she is wheelchair bound.  Reflexes: Deep tendon reflexes are symmetric.  Assessment/Plan:  1. Multiple sclerosis  2. Dementia  3. Gait disorder  4. Neurogenic bladder  5. Right trigeminal neuralgia  The patient has gained good improvement with the trigeminal neuralgia associated with the use of Lyrica. She will continue this medication. She is to remain on Avonex. She is on Aricept for memory. A prescription was sent in for a baclofen. She will follow-up in 6 months.  Jill Alexanders MD 06/29/2017 2:34 PM  [facility] [address] [place], [place] [address]  Phone [phone] Fax [phone]

## 2017-06-30 DIAGNOSIS — G629 Polyneuropathy, unspecified: Secondary | ICD-10-CM | POA: Diagnosis not present

## 2017-06-30 DIAGNOSIS — L0231 Cutaneous abscess of buttock: Secondary | ICD-10-CM | POA: Diagnosis not present

## 2017-06-30 DIAGNOSIS — G500 Trigeminal neuralgia: Secondary | ICD-10-CM | POA: Diagnosis not present

## 2017-06-30 DIAGNOSIS — I82402 Acute embolism and thrombosis of unspecified deep veins of left lower extremity: Secondary | ICD-10-CM | POA: Diagnosis not present

## 2017-06-30 DIAGNOSIS — I10 Essential (primary) hypertension: Secondary | ICD-10-CM | POA: Diagnosis not present

## 2017-06-30 DIAGNOSIS — G35 Multiple sclerosis: Secondary | ICD-10-CM | POA: Diagnosis not present

## 2017-07-11 ENCOUNTER — Other Ambulatory Visit: Payer: Self-pay | Admitting: Nurse Practitioner

## 2017-08-21 DIAGNOSIS — H524 Presbyopia: Secondary | ICD-10-CM | POA: Diagnosis not present

## 2017-09-27 ENCOUNTER — Telehealth: Payer: Self-pay | Admitting: *Deleted

## 2017-09-27 NOTE — Telephone Encounter (Signed)
Faxed signed order back to [employer] [facility] re: Avonex 76mg pen kit. Qty 12. Directions: Inject 335m IM once weekly. Refills: 12. Fax: [phone] fax confirmation.

## 2017-10-18 NOTE — Telephone Encounter (Signed)
Pt's son (p) [phone] called he spoke with [employer] (p) [phone] and was advised they need a new RX for medication faxed to [phone]

## 2017-10-18 NOTE — Telephone Encounter (Signed)
Called and spoke with [facility] with [employer]. Advised we sent rx for [employer] pt assistance program back on 09/27/17. He states pt no longer enrolled in this but has funds to help pay for medication. He will transfer prescription previously sent to specialty pharmacy. Nothing further needed at this time.

## 2017-10-23 ENCOUNTER — Telehealth: Payer: Self-pay | Admitting: *Deleted

## 2017-10-23 NOTE — Telephone Encounter (Signed)
Submitted [place] Avonex 0MCG/0.5ML IM [employer] covermymeds.Key: [id number], Rx #: [id number]. Waiting on determination.

## 2017-10-23 NOTE — Telephone Encounter (Signed)
Received fax notification from [facility] that [place] Avonex approved until 10/09/2018.   Sent copy of approval letter to [facility] at [phone]. Received fax confirmation.

## 2017-10-26 DIAGNOSIS — I10 Essential (primary) hypertension: Secondary | ICD-10-CM | POA: Diagnosis not present

## 2017-10-26 DIAGNOSIS — E7800 Pure hypercholesterolemia, unspecified: Secondary | ICD-10-CM | POA: Diagnosis not present

## 2017-10-26 DIAGNOSIS — H6123 Impacted cerumen, bilateral: Secondary | ICD-10-CM | POA: Diagnosis not present

## 2017-10-26 DIAGNOSIS — F325 Major depressive disorder, single episode, in full remission: Secondary | ICD-10-CM | POA: Diagnosis not present

## 2017-10-26 DIAGNOSIS — R21 Rash and other nonspecific skin eruption: Secondary | ICD-10-CM | POA: Diagnosis not present

## 2017-12-06 DIAGNOSIS — S0501XA Injury of conjunctiva and corneal abrasion without foreign body, right eye, initial encounter: Secondary | ICD-10-CM | POA: Diagnosis not present

## 2017-12-17 ENCOUNTER — Other Ambulatory Visit: Payer: Self-pay | Admitting: Neurology

## 2017-12-18 ENCOUNTER — Other Ambulatory Visit: Payer: Self-pay | Admitting: *Deleted

## 2017-12-18 MED ORDER — PREGABALIN 100 MG PO CAPS: 100.0000 mg | ORAL_CAPSULE | Freq: Two times a day (BID) | ORAL | 5 refills | Status: DC

## 2017-12-18 NOTE — Telephone Encounter (Signed)
Faxed printed/signed rx [facility] to [facility] [employer] at [phone]. Received fax confirmation.

## 2017-12-21 DIAGNOSIS — Z1231 Encounter for screening mammogram for malignant neoplasm of breast: Secondary | ICD-10-CM | POA: Diagnosis not present

## 2017-12-26 ENCOUNTER — Ambulatory Visit: Payer: Medicare HMO | Admitting: Adult Health

## 2017-12-26 ENCOUNTER — Encounter: Payer: Self-pay | Admitting: Adult Health

## 2017-12-26 VITALS — BP 130/69 | HR 61 | Ht 64.0 in

## 2017-12-26 DIAGNOSIS — G35 Multiple sclerosis: Secondary | ICD-10-CM

## 2017-12-26 DIAGNOSIS — G500 Trigeminal neuralgia: Secondary | ICD-10-CM

## 2017-12-26 DIAGNOSIS — R413 Other amnesia: Secondary | ICD-10-CM | POA: Diagnosis not present

## 2017-12-26 DIAGNOSIS — Z5181 Encounter for therapeutic drug level monitoring: Secondary | ICD-10-CM | POA: Diagnosis not present

## 2017-12-26 NOTE — Progress Notes (Signed)
I have read the note, and I agree with the clinical assessment and plan.  Charles K Willis   

## 2017-12-26 NOTE — Progress Notes (Signed)
PATIENT: [name] DOB: 1945/12/24  REASON FOR VISIT: follow up HISTORY FROM: patient  HISTORY OF PRESENT ILLNESS: Today 12/26/17 [name] is a 72 year old female with a history of multiple sclerosis and memory disturbance.  She returns today for follow-up.  She is currently on Avonex and continues to tolerate it well.  She lives at home with her son.  She is nonambulatory.  Her son reports that she is able to stand for transfers.  She does require assistance with most ADLs.  She is able to feed herself but finds it hard to do with the right-hand d/t mobility.  Reports that she does spend a lot of time sleeping during the day.  He states that when she gets up in the morning she typically sits in front of the TV most of the day.  He is looking to get her into an adult day center but financially this is not worked out.  He reports that her constipation is better.  He denies any significant changes in regards to new numbness or tingling or weakness.  In regards to her memory he reports that she sometimes will ask about deceased relatives.  She continues to take Detrol at night for urination.  She continues on baclofen for spasticity although the son is unsure of the benefit.  He reports that currently the pain with the addition of Lyrica has controlled trigeminal neuralgia.  Patient returns today for evaluation.  HISTORY 06/29/17: [name] is a 72 year old right-handed black female with a history of multiple sclerosis. The patient has end stage features, she has a significant dementia and she is nonambulatory. She has had problems with a pressure sore on her buttocks that is now healing. She has recently completed physical therapy and occupational therapy. She was also seen and evaluated through speech therapy for her swallowing and was felt to be doing well in this regard. She has trigeminal neuralgia on the right but Lyrica has been very effective for treating this. The patient also  remains on carbamazepine. She has difficulty with the use of her hands, she may at times require assistance even with feeding. The patient can stand and help some with transfers. She is sleeping well at night. She is on Aricept for memory. She returns for an evaluation.  REVIEW OF SYSTEMS: Out of a complete 14 system review of symptoms, the patient complains only of the following symptoms, and all other reviewed systems are negative.  ALLERGIES: Allergies  Allergen Reactions  . Prednisone     Had gotten thrush  . Shellfish Allergy     Hives     HOME MEDICATIONS: Outpatient Medications Prior to Visit  Medication Sig Dispense Refill  . acetaminophen (TYLENOL) 500 MG tablet Take 500 mg by mouth as needed.    Marland Kitchen amLODipine (NORVASC) 10 MG tablet Take 1 tablet (10 mg total) by mouth daily. 30 tablet 3  . APPLE CIDER VINEGAR PO Take 1 capsule by mouth daily. 450mg     . atorvastatin (LIPITOR) 40 MG tablet Take 40 mg by mouth daily.      . baclofen (LIORESAL) 10 MG tablet One tablet twice during the day and 2 at night 360 tablet 3  . BLACK CURRANT SEED OIL PO Take 1 capsule by mouth daily.    Marland Kitchen buPROPion (WELLBUTRIN XL) 300 MG 24 hr tablet Take 300 mg by mouth daily.    . carbamazepine (TEGRETOL) 200 MG tablet TAKE 1 TABLET BY MOUTH 3 TIMES A DAY AND  1/2 TABLET AT BEDTIME AT BEDTIME 135 tablet 6  . Cholecalciferol ([facility] VIT D 5000 HIGH-POTENCY PO) Take 5,000 Int'l Units/L by mouth daily.    Marland Kitchen donepezil (ARICEPT) 5 MG tablet Take 1 tablet (5 mg total) by mouth daily. 90 tablet 4  . FIBER SELECT GUMMIES PO Take 5 g by mouth 2 (two) times daily.     . fish oil-omega-3 fatty acids 1000 MG capsule Take 2 g by mouth 3 (three) times daily.    . Interferon Beta-1a (AVONEX PEN) 30 MCG/0.5ML AJKT Inject 30 mcg into the muscle every 7 (seven) days. 12 each 3  . labetalol (NORMODYNE) 100 MG tablet Take 100 mg by mouth 2 (two) times daily.    Marland Kitchen losartan-hydrochlorothiazide (HYZAAR) 100-12.5 MG per tablet       . Multiple Vitamins-Minerals (MULTIVITAMINS THER. W/MINERALS) TABS Take 1 tablet by mouth daily.      . petrolatum-hydrophilic-aloe vera (ALOE VESTA) ointment Apply topically 2 (two) times daily as needed for wound care. 226 g 0  . Potassium Chloride (KLOR-CON PO) Take 20 mEq by mouth.    . pregabalin (LYRICA) 100 MG capsule Take 1 capsule (100 mg total) by mouth 2 (two) times daily. 60 capsule 5  . sertraline (ZOLOFT) 50 MG tablet Take 50 mg by mouth daily. One and one half tablet once a day in morning.    . Skin Protectants, Misc. (WHITE PETROLATUM-ZINC OXIDE) cream Apply topically as needed. 98 g 0  . tolterodine (DETROL LA) 2 MG 24 hr capsule TAKE 1 CAPSULE BY MOUTH AT BEDTIME 90 capsule 3  . traMADol (ULTRAM) 50 MG tablet TAKE 1 TABLET BY MOUTH EVERY 4-6 AS NEEDED MUST LAST 28 DAYS 150 tablet 2  . potassium chloride SA (K-DUR,KLOR-CON) 20 MEQ tablet Take 20 mEq by mouth 3 (three) times daily.       No facility-administered medications prior to visit.     PAST MEDICAL HISTORY: Past Medical History:  Diagnosis Date  . Abnormality of gait   . Depression   . DVT (deep venous thrombosis) (HCC)    Left leg  . Dyslipidemia   . Fibroids   . Hypertension   . Memory difficulties 12/10/2014  . Multiple sclerosis (HCC)    Right sided weakness and gait disorder, bladder and bowel problems   . Neurogenic bladder   . 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 Overdose of muscle relaxant    Unintentional baclofen overdose   . Trigeminal neuralgia   . Trigeminal neuralgia    Right    PAST SURGICAL HISTORY: Past Surgical History:  Procedure Laterality Date  . gamma knife procedure for rt. trigeminal neuralgia Right   . left leg DVT Left   . STRABISMUS SURGERY     Right eye     FAMILY HISTORY: Family History  Problem Relation Age of Onset  . Multiple sclerosis Sister   . Cervical cancer Mother   . Liver disease Father   . Hyperlipidemia Brother   . Pancreatitis Brother   .  Hypertension Unknown   . Heart disease Unknown   . Uterine cancer Unknown     SOCIAL HISTORY: Social History   Socioeconomic History  . Marital status: Divorced    Spouse name: Not on file  . Number of children: 2  . Years of education: 6  . Highest education level: Not on file  Social Needs  . Financial resource strain: Not on file  . Food insecurity -  worry: Not on file  . Food insecurity - inability: Not on file  . Transportation needs - medical: Not on file  . Transportation needs - non-medical: Not on file  Occupational History  . Occupation: disabled Education officer, museum  Tobacco Use  . Smoking status: Never Smoker  . Smokeless tobacco: Never Used  Substance and Sexual Activity  . Alcohol use: No  . Drug use: No  . Sexual activity: No    Birth control/protection: Post-menopausal  Other Topics Concern  . Not on file  Social History Narrative   Divorced, has a son and daughter. [name] is very involved in her care. [phone]   Living with her son, [name] as FT caregiver.  02-09-15 ssy      Patient drinks about 2 sodas daily.   Patient is right handed.       PHYSICAL EXAM  Vitals:   12/26/17 1305  BP: 130/69  Pulse: 61  Height: 5\' 4"  (1.626 m)   Body mass index is 21.97 kg/m.   MMSE - Mini Mental State Exam 12/26/2017 06/29/2017 12/20/2016  Orientation to time 0 0 0  Orientation to Place 3 4 4   Registration 3 3 3   Attention/ Calculation 3 3 2   Recall 2 2 1   Language- name 2 objects 2 2 2   Language- repeat 1 1 1   Language- follow 3 step command 3 3 2   Language- read & follow direction 1 0 1  Write a sentence 0 0 1  Copy design 0 0 0  Total score 18 18 17     Generalized: Well developed, in no acute distress   Neurological examination  Mentation: Alert .Marland Kitchen Follows all commands speech and language fluent Cranial nerve II-XII: Pupils were equal round reactive to light. Extraocular movements were full, visual field were full on confrontational test.  Facial sensation and strength were normal. Uvula tongue midline. Head turning and shoulder shrug  were normal and symmetric. Motor: The motor testing reveals 5 over 5 strength in the upper extremities.  Minimal mobility in the lower extremities. Sensory: Sensory testing is intact to soft touch on all 4 extremities. No evidence of extinction is noted.  Coordination: Cerebellar testing reveals good finger-nose-finger on the left some difficulty on the right.  Unable to complete heel to shin bilaterally Gait and station: Patient is nonambulatory   DIAGNOSTIC DATA (LABS, IMAGING, TESTING) - I reviewed patient records, labs, notes, testing and imaging myself where available.  Lab Results  Component Value Date   WBC 3.7 12/20/2016   HGB 11.8 12/20/2016   HCT 35.6 12/20/2016   MCV 93 12/20/2016   PLT 186 12/20/2016      Component Value Date/Time   NA 144 12/20/2016 1625   K 4.1 12/20/2016 1625   CL 101 12/20/2016 1625   CO2 29 12/20/2016 1625   GLUCOSE 96 12/20/2016 1625   GLUCOSE 95 10/06/2011 0525   BUN 17 12/20/2016 1625   CREATININE 0.76 12/20/2016 1625   CALCIUM 9.4 12/20/2016 1625   PROT 7.0 12/20/2016 1625   ALBUMIN 4.1 12/20/2016 1625   AST 21 12/20/2016 1625   ALT 19 12/20/2016 1625   ALKPHOS 99 12/20/2016 1625   BILITOT 0.3 12/20/2016 1625   GFRNONAA 81 12/20/2016 1625   GFRAA 93 12/20/2016 1625     ASSESSMENT AND PLAN 72 y.o. year old female  has a past medical history of Abnormality of gait, Depression, DVT (deep venous thrombosis) ([place]), Dyslipidemia, Fibroids, Hypertension, Memory difficulties (12/10/2014), Multiple  sclerosis ([place]), Neurogenic bladder, Organic brain syndrome, Overdose of muscle relaxant, Trigeminal neuralgia, and Trigeminal neuralgia. here with:  1.  Trigeminal neuralgia 2.  Multiple sclerosis 3.  Memory disturbance  The patient will continue on carbamazepine and Lyrica for trigeminal neuralgia.  We will check blood work today.  She will continue  on Avonex for multiple sclerosis.  The patient's memory score has remained stable.  For that reason she will remain on Aricept 5 mg daily.  The patient is on baclofen for spasticity.  The son is unsure how beneficial this is.  I advised that he can reduce the morning dose to half a tablet and try this for 1 week to see if there is any change in her symptoms.  He can continue to reduce each dose by half a tablet each week.  If there is no change in the patient's symptoms we may be able to wean the patient off of this medication.  I have also given the Son the number to  care patrol to  potentially help with an adult day center.  Patient and son voiced understanding.  She will return in 6 months or sooner if needed.    Ward Givens, MSN, NP-C 12/26/2017, 1:16 PM [facility] [address] [place], [place] [address] [phone]

## 2017-12-26 NOTE — Patient Instructions (Signed)
Your Plan:  Continue Avonex Continue Aricept for memory Continue Carbamazepine and Lyrica for trigeminal neuralgia [facility] [employer] [phone] Decrease baclofen to 1/2 tablet in the morning. Can keep reducing each dose by 1/2 tablet to see if medication is beneficial If your symptoms worsen or you develop new symptoms please let us know.   Thank you for coming to see [place] at [facility]. I hope we have been able to provide you high quality care today.  You may receive a patient satisfaction survey over the next few weeks. We would appreciate your feedback and comments so that we may continue to improve ourselves and the health of our patients.

## 2017-12-28 LAB — COMPREHENSIVE METABOLIC PANEL
ALBUMIN: 4.2 g/dL (ref 3.6–4.8)
ALT: 19 IU/L (ref 0–32)
AST: 23 IU/L (ref 0–40)
Albumin/Globulin Ratio: 1.4 (ref 1.2–2.2)
Alkaline Phosphatase: 103 IU/L (ref 39–117)
BUN / CREAT RATIO: 23 (ref 12–28)
BUN: 15 mg/dL (ref 8–27)
Bilirubin Total: <0.2 mg/dL (ref 0.0–1.2)
CALCIUM: 9.6 mg/dL (ref 8.7–10.3)
CO2: 20 mmol/L (ref 20–29)
CREATININE: 0.65 mg/dL (ref 0.57–1.00)
Chloride: 108 mmol/L — ABNORMAL HIGH (ref 96–106)
GFR, EST AFRICAN AMERICAN: 105 mL/min/{1.73_m2} (ref >59)
GFR, EST NON AFRICAN AMERICAN: 91 mL/min/{1.73_m2} (ref >59)
GLUCOSE: 117 mg/dL — AB (ref 65–99)
Globulin, Total: 3 g/dL (ref 1.5–4.5)
Potassium: 3.9 mmol/L (ref 3.5–5.2)
Sodium: 147 mmol/L — ABNORMAL HIGH (ref 134–144)
TOTAL PROTEIN: 7.2 g/dL (ref 6.0–8.5)

## 2017-12-28 LAB — CBC WITH DIFFERENTIAL/PLATELET
Basophils Absolute: 0 10*3/uL (ref 0.0–0.2)
Basos: 0 %
EOS (ABSOLUTE): 0.1 10*3/uL (ref 0.0–0.4)
EOS: 2 %
HEMATOCRIT: 36.9 % (ref 34.0–46.6)
Hemoglobin: 12.3 g/dL (ref 11.1–15.9)
IMMATURE GRANS (ABS): 0 10*3/uL (ref 0.0–0.1)
Immature Granulocytes: 0 %
LYMPHS ABS: 1.1 10*3/uL (ref 0.7–3.1)
Lymphs: 33 %
MCH: 30.8 pg (ref 26.6–33.0)
MCHC: 33.3 g/dL (ref 31.5–35.7)
MCV: 92 fL (ref 79–97)
Monocytes Absolute: 0.3 10*3/uL (ref 0.1–0.9)
Monocytes: 9 %
NEUTROS ABS: 1.9 10*3/uL (ref 1.4–7.0)
Neutrophils: 56 %
Platelets: 168 10*3/uL (ref 150–379)
RBC: 4 x10E6/uL (ref 3.77–5.28)
RDW: 14.1 % (ref 12.3–15.4)
WBC: 3.4 10*3/uL (ref 3.4–10.8)

## 2017-12-28 LAB — CARBAMAZEPINE LEVEL, TOTAL: Carbamazepine (Tegretol), S: 9.8 ug/mL (ref 4.0–12.0)

## 2018-01-01 ENCOUNTER — Telehealth: Payer: Self-pay | Admitting: *Deleted

## 2018-01-01 NOTE — Telephone Encounter (Signed)
Spoke with patient's son, [name] on [place] and informed him her blood work is relatively unremarkable. Advised her sodium is slightly elevated, and Jinny Blossom will continue to monitor it. He verbalized understanding, appreciation for call.

## 2018-01-04 DIAGNOSIS — D485 Neoplasm of uncertain behavior of skin: Secondary | ICD-10-CM | POA: Diagnosis not present

## 2018-01-04 DIAGNOSIS — L859 Epidermal thickening, unspecified: Secondary | ICD-10-CM | POA: Diagnosis not present

## 2018-01-04 DIAGNOSIS — L988 Other specified disorders of the skin and subcutaneous tissue: Secondary | ICD-10-CM | POA: Diagnosis not present

## 2018-02-10 ENCOUNTER — Other Ambulatory Visit: Payer: Self-pay | Admitting: Neurology

## 2018-03-12 ENCOUNTER — Other Ambulatory Visit: Payer: Self-pay | Admitting: Nurse Practitioner

## 2018-03-22 ENCOUNTER — Other Ambulatory Visit: Payer: Self-pay | Admitting: Neurology

## 2018-05-02 DIAGNOSIS — G35 Multiple sclerosis: Secondary | ICD-10-CM | POA: Diagnosis not present

## 2018-05-02 DIAGNOSIS — Z1211 Encounter for screening for malignant neoplasm of colon: Secondary | ICD-10-CM | POA: Diagnosis not present

## 2018-05-02 DIAGNOSIS — F325 Major depressive disorder, single episode, in full remission: Secondary | ICD-10-CM | POA: Diagnosis not present

## 2018-05-02 DIAGNOSIS — E7800 Pure hypercholesterolemia, unspecified: Secondary | ICD-10-CM | POA: Diagnosis not present

## 2018-05-02 DIAGNOSIS — I10 Essential (primary) hypertension: Secondary | ICD-10-CM | POA: Diagnosis not present

## 2018-05-02 DIAGNOSIS — F419 Anxiety disorder, unspecified: Secondary | ICD-10-CM | POA: Diagnosis not present

## 2018-06-03 DIAGNOSIS — Z1211 Encounter for screening for malignant neoplasm of colon: Secondary | ICD-10-CM | POA: Diagnosis not present

## 2018-06-13 ENCOUNTER — Other Ambulatory Visit: Payer: Self-pay | Admitting: Neurology

## 2018-07-04 ENCOUNTER — Other Ambulatory Visit: Payer: Self-pay | Admitting: Neurology

## 2018-07-09 ENCOUNTER — Encounter: Payer: Self-pay | Admitting: Adult Health

## 2018-07-09 ENCOUNTER — Ambulatory Visit: Payer: Medicare HMO | Admitting: Adult Health

## 2018-07-09 VITALS — BP 149/73 | HR 73 | Ht 64.0 in

## 2018-07-09 DIAGNOSIS — R269 Unspecified abnormalities of gait and mobility: Secondary | ICD-10-CM

## 2018-07-09 DIAGNOSIS — G35D Multiple sclerosis, unspecified: Secondary | ICD-10-CM

## 2018-07-09 DIAGNOSIS — Z5181 Encounter for therapeutic drug level monitoring: Secondary | ICD-10-CM | POA: Diagnosis not present

## 2018-07-09 DIAGNOSIS — R413 Other amnesia: Secondary | ICD-10-CM | POA: Diagnosis not present

## 2018-07-09 DIAGNOSIS — G35 Multiple sclerosis: Secondary | ICD-10-CM | POA: Diagnosis not present

## 2018-07-09 DIAGNOSIS — G500 Trigeminal neuralgia: Secondary | ICD-10-CM

## 2018-07-09 NOTE — Progress Notes (Signed)
PATIENT: [name] DOB: [birth year]  REASON FOR VISIT: follow up HISTORY FROM: patient  HISTORY OF PRESENT ILLNESS: Today 07/09/18:  [name] is a 72 year old female with a history of multiple sclerosis, trigeminal neuralgia and memory disturbance.  She returns today for follow-up.  She remains on Avonex.  She continues to live at home with her son.  Her son reports that her right hand has progressively gotten weaker.  He reports that she has a hard time writing her name on checks.  She primarily uses a wheelchair.  She is nonambulatory.  He has to assist with all transfers.  They do have an aide that comes in to help with ADLs.  No changes with the bowels or bladder.  She still has incontinence at night.  He continues on carbamazepine and Lyrica for trigeminal neuralgia.  She denies any pain unless she misses a dose.  No change in her sleep.  He feels that her memory is about the same.  She remains on Aricept 5 mg daily.  She returns today for evaluation.   HISTORY 12/26/17 [name] is a 72 year old female with a history of multiple sclerosis and memory disturbance.  She returns today for follow-up.  She is currently on Avonex and continues to tolerate it well.  She lives at home with her son.  She is nonambulatory.  Her son reports that she is able to stand for transfers.  She does require assistance with most ADLs.  She is able to feed herself but finds it hard to do with the right-hand d/t mobility.  Reports that she does spend a lot of time sleeping during the day.  He states that when she gets up in the morning she typically sits in front of the TV most of the day.  He is looking to get her into an adult day center but financially this is not worked out.  He reports that her constipation is better.  He denies any significant changes in regards to new numbness or tingling or weakness.  In regards to her memory he reports that she sometimes will ask about deceased relatives.  She  continues to take Detrol at night for urination.  She continues on baclofen for spasticity although the son is unsure of the benefit.  He reports that currently the pain with the addition of Lyrica has controlled trigeminal neuralgia.  Patient returns today for evaluation.   REVIEW OF SYSTEMS: Out of a complete 14 system review of symptoms, the patient complains only of the following symptoms, and all other reviewed systems are negative.  See HPI  ALLERGIES: Allergies  Allergen Reactions  . Prednisone     Had gotten thrush  . Shellfish Allergy     Hives     HOME MEDICATIONS: Outpatient Medications Prior to Visit  Medication Sig Dispense Refill  . acetaminophen (TYLENOL) 500 MG tablet Take 500 mg by mouth as needed.    Marland Kitchen amLODipine (NORVASC) 10 MG tablet Take 1 tablet (10 mg total) by mouth daily. 30 tablet 3  . APPLE CIDER VINEGAR PO Take 1 capsule by mouth daily. 450mg     . atorvastatin (LIPITOR) 40 MG tablet Take 40 mg by mouth daily.      . baclofen (LIORESAL) 10 MG tablet One tablet twice during the day and 2 at night 360 tablet 3  . BLACK CURRANT SEED OIL PO Take 1 capsule by mouth daily.    Marland Kitchen buPROPion (WELLBUTRIN XL) 300 MG 24 hr tablet  Take 300 mg by mouth daily.    . carbamazepine (TEGRETOL) 200 MG tablet TAKE 1 TABLET BY MOUTH 3 TIMES A DAY AND 1/2 TABLET AT BEDTIME AT BEDTIME 315 tablet 0  . Cholecalciferol ([employer] VIT D 5000 HIGH-POTENCY PO) Take 5,000 Int'l Units/L by mouth daily.    Marland Kitchen donepezil (ARICEPT) 5 MG tablet TAKE 1 TABLET (5 MG TOTAL) BY MOUTH DAILY. 90 tablet 4  . FIBER SELECT GUMMIES PO Take 5 g by mouth 2 (two) times daily.     . fish oil-omega-3 fatty acids 1000 MG capsule Take 2 g by mouth 3 (three) times daily.    . Interferon Beta-1a (AVONEX PEN) 30 MCG/0.5ML AJKT Inject 30 mcg into the muscle every 7 (seven) days. 12 each 3  . labetalol (NORMODYNE) 100 MG tablet Take 100 mg by mouth 2 (two) times daily.    Marland Kitchen losartan-hydrochlorothiazide (HYZAAR) 100-12.5  MG per tablet     . Multiple Vitamins-Minerals (MULTIVITAMINS THER. W/MINERALS) TABS Take 1 tablet by mouth daily.      . petrolatum-hydrophilic-aloe vera (ALOE VESTA) ointment Apply topically 2 (two) times daily as needed for wound care. 226 g 0  . Potassium Chloride (KLOR-CON PO) Take 20 mEq by mouth.    . pregabalin (LYRICA) 100 MG capsule TAKE 1 CAPSULE BY MOUTH TWICE A DAY 60 capsule 5  . sertraline (ZOLOFT) 50 MG tablet Take 50 mg by mouth daily. One and one half tablet once a day in morning.    . Skin Protectants, Misc. (WHITE PETROLATUM-ZINC OXIDE) cream Apply topically as needed. 98 g 0  . tolterodine (DETROL LA) 2 MG 24 hr capsule TAKE 1 CAPSULE BY MOUTH AT BEDTIME 90 capsule 1  . traMADol (ULTRAM) 50 MG tablet TAKE 1 TABLET BY MOUTH EVERY 4-6 AS NEEDED MUST LAST 28 DAYS 150 tablet 2   No facility-administered medications prior to visit.     PAST MEDICAL HISTORY: Past Medical History:  Diagnosis Date  . Abnormality of gait   . Depression   . DVT (deep venous thrombosis) (HCC)    Left leg  . Dyslipidemia   . Fibroids   . Hypertension   . Memory difficulties 12/10/2014  . Multiple sclerosis (HCC)    Right sided weakness and gait disorder, bladder and bowel problems   . Neurogenic bladder   . 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 Overdose of muscle relaxant    Unintentional baclofen overdose   . Trigeminal neuralgia   . Trigeminal neuralgia    Right    PAST SURGICAL HISTORY: Past Surgical History:  Procedure Laterality Date  . gamma knife procedure for rt. trigeminal neuralgia Right   . left leg DVT Left   . STRABISMUS SURGERY     Right eye     FAMILY HISTORY: Family History  Problem Relation Age of Onset  . Multiple sclerosis Sister   . Cervical cancer Mother   . Liver disease Father   . Hyperlipidemia Brother   . Pancreatitis Brother   . Hypertension Unknown   . Heart disease Unknown   . Uterine cancer Unknown     SOCIAL HISTORY: Social History    Socioeconomic History  . Marital status: Divorced    Spouse name: Not on file  . Number of children: 2  . Years of education: 11  . Highest education level: Not on file  Occupational History  . Occupation: disabled Education officer, museum  Social Needs  . Financial resource strain: Not  on file  . Food insecurity:    Worry: Not on file    Inability: Not on file  . Transportation needs:    Medical: Not on file    Non-medical: Not on file  Tobacco Use  . Smoking status: Never Smoker  . Smokeless tobacco: Never Used  Substance and Sexual Activity  . Alcohol use: No  . Drug use: No  . Sexual activity: Never    Birth control/protection: Post-menopausal  Lifestyle  . Physical activity:    Days per week: Not on file    Minutes per session: Not on file  . Stress: Not on file  Relationships  . Social connections:    Talks on phone: Not on file    Gets together: Not on file    Attends religious service: Not on file    Active member of club or organization: Not on file    Attends meetings of clubs or organizations: Not on file    Relationship status: Not on file  . Intimate partner violence:    Fear of current or ex partner: Not on file    Emotionally abused: Not on file    Physically abused: Not on file    Forced sexual activity: Not on file  Other Topics Concern  . Not on file  Social History Narrative   Divorced, has a son and daughter. [name] is very involved in her care. [phone] and [phone]   Living with her son, [name] as FT caregiver.  02-09-15 ssy      Patient drinks about 2 sodas daily.   Patient is right handed.       PHYSICAL EXAM  Vitals:   07/09/18 1417  BP: (!) 149/73  Pulse: 73  Height: 5\' 4"  (1.626 m)   Body mass index is 21.97 kg/m.  Generalized: Well developed, in no acute distress   Neurological examination  Mentation: Alert oriented to time, place, history taking. Follows all commands speech and language fluent Cranial nerve II-XII: Pupils were  equal round reactive to light. Extraocular movements were full, visual field were full on confrontational test. Facial sensation and strength were normal. Uvula tongue midline. Head turning and shoulder shrug  were normal and symmetric. Motor: The motor testing reveals 5 over 5 strength in the upper extremities.1-2 /5 in the lower extremities.  Sensory: Sensory testing is intact to soft touch on all 4 extremities. No evidence of extinction is noted.  Coordination: Cerebellar testing reveals good finger-nose-finger but unable to complete  heel-to-shin bilaterally Gait and station: Patient is in a wheelchair. Reflexes: Deep tendon reflexes are symmetric and normal bilaterally.   DIAGNOSTIC DATA (LABS, IMAGING, TESTING) - I reviewed patient records, labs, notes, testing and imaging myself where available.  Lab Results  Component Value Date   WBC 3.4 12/26/2017   HGB 12.3 12/26/2017   HCT 36.9 12/26/2017   MCV 92 12/26/2017   PLT 168 12/26/2017      Component Value Date/Time   NA 147 (H) 12/26/2017 1406   K 3.9 12/26/2017 1406   CL 108 (H) 12/26/2017 1406   CO2 20 12/26/2017 1406   GLUCOSE 117 (H) 12/26/2017 1406   GLUCOSE 95 10/06/2011 0525   BUN 15 12/26/2017 1406   CREATININE 0.65 12/26/2017 1406   CALCIUM 9.6 12/26/2017 1406   PROT 7.2 12/26/2017 1406   ALBUMIN 4.2 12/26/2017 1406   AST 23 12/26/2017 1406   ALT 19 12/26/2017 1406   ALKPHOS 103 12/26/2017 1406   BILITOT <  0.2 12/26/2017 1406   GFRNONAA 91 12/26/2017 1406   GFRAA 105 12/26/2017 1406   No results found for: CHOL, HDL, LDLCALC, LDLDIRECT, TRIG, CHOLHDL No results found for: HGBA1C No results found for: VITAMINB12 No results found for: TSH    ASSESSMENT AND PLAN 72 y.o. year old female  has a past medical history of Abnormality of gait, Depression, DVT (deep venous thrombosis) ([place]), Dyslipidemia, Fibroids, Hypertension, Memory difficulties (12/10/2014), Multiple sclerosis ([place]), Neurogenic bladder, Organic  brain syndrome, Overdose of muscle relaxant, Trigeminal neuralgia, and Trigeminal neuralgia. here with:  1.  Multiple sclerosis 2.  Trigeminal neuralgia 3.  Memory disturbance 4.  Gait abnormality  The patient will continue on Avonex.  I will check blood work today.  She will remain on Lyrica and carbamazepine for trigeminal neuralgia.  She will continue on Aricept for memory disturbance.  I have written an order for a lift to help with transfers.  Also refer the patient to physical therapy and Occupational Therapy.  I have advised that if her symptoms worsen or she develops new symptoms she should let us know.  She will follow-up in 6 months or sooner if needed.   Ward Givens, MSN, NP-C 07/09/2018, 2:39 PM [facility] [address] [place], [place] [address] [phone]

## 2018-07-09 NOTE — Patient Instructions (Addendum)
Your Plan:  Continue Avonex Blood work today Continue Baclofen for spasms  Continue Carbamazepine and Lyrica If your symptoms worsen or you develop new symptoms please let us know.    Thank you for coming to see [place] at [facility]. I hope we have been able to provide you high quality care today.  You may receive a patient satisfaction survey over the next few weeks. We would appreciate your feedback and comments so that we may continue to improve ourselves and the health of our patients.

## 2018-07-10 ENCOUNTER — Telehealth: Payer: Self-pay | Admitting: *Deleted

## 2018-07-10 DIAGNOSIS — L8992 Pressure ulcer of unspecified site, stage 2: Secondary | ICD-10-CM | POA: Diagnosis not present

## 2018-07-10 DIAGNOSIS — G35 Multiple sclerosis: Secondary | ICD-10-CM | POA: Diagnosis not present

## 2018-07-10 DIAGNOSIS — R32 Unspecified urinary incontinence: Secondary | ICD-10-CM | POA: Diagnosis not present

## 2018-07-10 DIAGNOSIS — L89309 Pressure ulcer of unspecified buttock, unspecified stage: Secondary | ICD-10-CM | POA: Diagnosis not present

## 2018-07-10 DIAGNOSIS — R269 Unspecified abnormalities of gait and mobility: Secondary | ICD-10-CM | POA: Diagnosis not present

## 2018-07-10 LAB — COMPREHENSIVE METABOLIC PANEL
ALBUMIN: 4.2 g/dL (ref 3.6–4.8)
ALK PHOS: 91 IU/L (ref 39–117)
ALT: 16 IU/L (ref 0–32)
AST: 23 IU/L (ref 0–40)
Albumin/Globulin Ratio: 1.7 (ref 1.2–2.2)
BUN / CREAT RATIO: 17 (ref 12–28)
BUN: 12 mg/dL (ref 8–27)
Bilirubin Total: <0.2 mg/dL (ref 0.0–1.2)
CO2: 21 mmol/L (ref 20–29)
Calcium: 9.3 mg/dL (ref 8.7–10.3)
Chloride: 106 mmol/L (ref 96–106)
Creatinine, Ser: 0.69 mg/dL (ref 0.57–1.00)
GFR calc non Af Amer: 89 mL/min/{1.73_m2} (ref >59)
GFR, EST AFRICAN AMERICAN: 103 mL/min/{1.73_m2} (ref >59)
GLOBULIN, TOTAL: 2.5 g/dL (ref 1.5–4.5)
Glucose: 96 mg/dL (ref 65–99)
Potassium: 4 mmol/L (ref 3.5–5.2)
SODIUM: 146 mmol/L — AB (ref 134–144)
TOTAL PROTEIN: 6.7 g/dL (ref 6.0–8.5)

## 2018-07-10 LAB — CBC WITH DIFFERENTIAL/PLATELET
BASOS: 1 %
Basophils Absolute: 0 10*3/uL (ref 0.0–0.2)
EOS (ABSOLUTE): 0.1 10*3/uL (ref 0.0–0.4)
Eos: 2 %
HEMATOCRIT: 35.6 % (ref 34.0–46.6)
Hemoglobin: 12.1 g/dL (ref 11.1–15.9)
Immature Grans (Abs): 0 10*3/uL (ref 0.0–0.1)
Immature Granulocytes: 0 %
LYMPHS ABS: 1.3 10*3/uL (ref 0.7–3.1)
Lymphs: 35 %
MCH: 31.7 pg (ref 26.6–33.0)
MCHC: 34 g/dL (ref 31.5–35.7)
MCV: 93 fL (ref 79–97)
MONOS ABS: 0.4 10*3/uL (ref 0.1–0.9)
Monocytes: 11 %
NEUTROS ABS: 2 10*3/uL (ref 1.4–7.0)
Neutrophils: 51 %
Platelets: 186 10*3/uL (ref 150–450)
RBC: 3.82 x10E6/uL (ref 3.77–5.28)
RDW: 13.1 % (ref 12.3–15.4)
WBC: 3.9 10*3/uL (ref 3.4–10.8)

## 2018-07-10 LAB — CARBAMAZEPINE LEVEL, TOTAL: Carbamazepine (Tegretol), S: 11.5 ug/mL (ref 4.0–12.0)

## 2018-07-10 NOTE — Telephone Encounter (Signed)
Called patient, and her son, [name] (on [place]) answered phone.  Informed him patent's lab work is unremarkable.  He verbalized understanding, appreciation.

## 2018-07-11 ENCOUNTER — Telehealth: Payer: Self-pay | Admitting: Adult Health

## 2018-07-11 NOTE — Telephone Encounter (Signed)
Angie with [facility] requesting a call back at [phone] regarding an order she had received from the RN

## 2018-07-11 NOTE — Telephone Encounter (Signed)
-----    From: Patsy Baltimore  Sent: 07/10/2018 11:51 AM EDT  To: Patsy Baltimore, Leeroy Bock, *  Subject: RE: lift for transfers              Lyn Hollingshead  this order has been pulled and sent into the office for processing. thanks.   Angie

## 2018-07-11 NOTE — Progress Notes (Signed)
Fax confirmation recieved for DME lift for transfers [facility] [phone]

## 2018-07-18 ENCOUNTER — Other Ambulatory Visit: Payer: Self-pay | Admitting: Neurology

## 2018-08-09 ENCOUNTER — Telehealth: Payer: Self-pay | Admitting: Adult Health

## 2018-08-09 NOTE — Telephone Encounter (Signed)
Pts son(Fredrick not on DPR) called stating he has jury duty coming and will need a letter stating that he is the pts full time caregiver and is unable to attend jury duty. Please advise

## 2018-08-10 DIAGNOSIS — R32 Unspecified urinary incontinence: Secondary | ICD-10-CM | POA: Diagnosis not present

## 2018-08-10 DIAGNOSIS — R269 Unspecified abnormalities of gait and mobility: Secondary | ICD-10-CM | POA: Diagnosis not present

## 2018-08-10 DIAGNOSIS — L89309 Pressure ulcer of unspecified buttock, unspecified stage: Secondary | ICD-10-CM | POA: Diagnosis not present

## 2018-08-10 DIAGNOSIS — G35 Multiple sclerosis: Secondary | ICD-10-CM | POA: Diagnosis not present

## 2018-08-10 DIAGNOSIS — L8992 Pressure ulcer of unspecified site, stage 2: Secondary | ICD-10-CM | POA: Diagnosis not present

## 2018-08-13 ENCOUNTER — Encounter: Payer: Self-pay | Admitting: Adult Health

## 2018-08-13 NOTE — Telephone Encounter (Signed)
Letter was given to Milon Dikes in [place] to contact pt, needing jury duty summons to make copy.  She would do this.

## 2018-08-13 NOTE — Telephone Encounter (Signed)
I called the patient son.  He cares for his mother 24/7.  He does have a caregiver that comes in 2 hours every morning.  He is currently not employed.  I agree that it may be difficult for him to participate in jury duty if he does not have someone that can care for his mother.

## 2018-08-28 ENCOUNTER — Telehealth: Payer: Self-pay | Admitting: Neurology

## 2018-08-28 NOTE — Telephone Encounter (Signed)
I called patient, left message.  If the adjustments in the carbamazepine dose are not effective for controlling the neuralgia, they are to contact me we may go up on the Lyrica 250 mg twice daily.

## 2018-08-28 NOTE — Telephone Encounter (Signed)
Late entry: talked to patient's last night around 9:30 PM. He reports that for the past week his mother has had a flareup in her trigeminal neuralgia. She has been stable on her current medication regimen which includes Tegretol 200 mg 3 times a day with an additional half tablet and Lyrica 100 mg twice a day. He was advised to try an additional half tablet of Tegretol. I suggested that it be best if Dr. Jannifer Franklin could review her regimen and make any additional suggestions for change in her medication regimen for her trigeminal neuralgia. Patient's son was in agreement. Patient was recently seen about a month and half ago and was at that time stable with her symptoms.

## 2018-08-29 MED ORDER — DEXAMETHASONE 2 MG PO TABS: ORAL_TABLET | ORAL | 0 refills | Status: DC

## 2018-08-29 MED ORDER — PREGABALIN 150 MG PO CAPS: 150.0000 mg | ORAL_CAPSULE | Freq: Two times a day (BID) | ORAL | 3 refills | Status: DC

## 2018-08-29 NOTE — Telephone Encounter (Signed)
I called patient, talk with the son.  The patient has had a significant flare of her neuralgia pain.  I will give her a 3-day course of Decadron, and increased Lyrica to 150 mg twice daily.

## 2018-08-29 NOTE — Telephone Encounter (Signed)
Noted/fim 

## 2018-08-29 NOTE — Addendum Note (Signed)
Addended by: Kathrynn Ducking on: 08/29/2018 08:21 AM   Modules accepted: Orders

## 2018-08-29 NOTE — Telephone Encounter (Signed)
Pts son returning Dr. Tobey Grim call, stating the pts TN flare up hasn't gotten better. Requesting a call to discuss increasing pts Lyrica.

## 2018-08-31 MED ORDER — CLOTRIMAZOLE 10 MG MT TROC: 10.0000 mg | Freq: Every day | OROMUCOSAL | 0 refills | Status: DC

## 2018-08-31 MED ORDER — DEXAMETHASONE 2 MG PO TABS: ORAL_TABLET | ORAL | 0 refills | Status: DC

## 2018-08-31 NOTE — Telephone Encounter (Signed)
Pts son called stating the pt is still in a lot of pain, and requesting a call stating he is unsure what to do. She woke him up every few hours "asking for something to take the pain away". Please call to advise.

## 2018-08-31 NOTE — Telephone Encounter (Signed)
I called the patient.  The patient got transient benefit with the 3-day course of Decadron, but the pain is right back.  The patient has increased the Lyrica just recently, she remains on a maximum dose of carbamazepine.  I will give her a longer course of Decadron, give her clotrimazole for the oral thrush that she usually gets with the medication.  If the pain does not improve, we may have to add Keppra to the regimen.

## 2018-08-31 NOTE — Addendum Note (Signed)
Addended by: Kathrynn Ducking on: 08/31/2018 02:01 PM   Modules accepted: Orders

## 2018-09-04 MED ORDER — LEVETIRACETAM 500 MG PO TABS: 500.0000 mg | ORAL_TABLET | Freq: Two times a day (BID) | ORAL | 3 refills | Status: DC

## 2018-09-04 NOTE — Telephone Encounter (Signed)
I spoke with pt's son. He sts. pt. continues to have exacerbation of facial/head pain. Still has oral thrush. Pain better with sitting up, worse with lying down. Taking Lyrica 100mg  tid and Tegretol 300mg  in the am, 200mg  in the afternoon, and 200-300mg  at bedtime. Will pass update along to Dr. Anabel Bene

## 2018-09-04 NOTE — Addendum Note (Signed)
Addended by: Kathrynn Ducking on: 09/04/2018 12:39 PM   Modules accepted: Orders

## 2018-09-04 NOTE — Telephone Encounter (Signed)
Pts son called stating the pt is now feeling the pain in her head and behind her right eye requesting a call to be advised on what to do. Stating he is at a "total loss" Please advise

## 2018-09-04 NOTE — Telephone Encounter (Signed)
I called the patient.  I talk with the son, she is still having some pain in the morning, when she was on the Decadron this did help but she is now tapering off of this.  When she gets up out of bed, the pain seems better.  The patient will be given Keppra 500 mg twice daily, this will be added to the Lyrica and the carbamazepine.  The patient may need to go for gamma knife procedure in the future.

## 2018-09-09 DIAGNOSIS — R32 Unspecified urinary incontinence: Secondary | ICD-10-CM | POA: Diagnosis not present

## 2018-09-09 DIAGNOSIS — R269 Unspecified abnormalities of gait and mobility: Secondary | ICD-10-CM | POA: Diagnosis not present

## 2018-09-09 DIAGNOSIS — L8992 Pressure ulcer of unspecified site, stage 2: Secondary | ICD-10-CM | POA: Diagnosis not present

## 2018-09-09 DIAGNOSIS — G35 Multiple sclerosis: Secondary | ICD-10-CM | POA: Diagnosis not present

## 2018-09-09 DIAGNOSIS — L89309 Pressure ulcer of unspecified buttock, unspecified stage: Secondary | ICD-10-CM | POA: Diagnosis not present

## 2018-09-17 NOTE — Telephone Encounter (Signed)
Pts son called stating medication Keppra may be too strong for the pt. Stating she "doesn't look or act herself" such has she hasn't been able to hold her own body weight, or glossy eyes. Requesting a call to advise.

## 2018-09-17 NOTE — Telephone Encounter (Signed)
I called the patient, talk with the son.  The Keppra is making her somewhat zoned out, fortunately she is not having any further pain.  We will go back to 1/2 tablet twice daily, if she continues to do well for several weeks without pain, we will stop the Keppra completely.

## 2018-09-18 ENCOUNTER — Other Ambulatory Visit: Payer: Self-pay | Admitting: Adult Health

## 2018-09-23 ENCOUNTER — Other Ambulatory Visit: Payer: Self-pay | Admitting: Neurology

## 2018-10-08 ENCOUNTER — Telehealth: Payer: Self-pay | Admitting: Adult Health

## 2018-10-08 MED ORDER — INTERFERON BETA-1A 30 MCG/0.5ML IM AJKT: 30.0000 ug | AUTO-INJECTOR | INTRAMUSCULAR | 3 refills | Status: DC

## 2018-10-08 NOTE — Telephone Encounter (Addendum)
Spoke with the patient's son/caregiver and he stated that she is still currently taking the Avonex injections with no problems.   Spoke with [facility] and they stated that they need a new script faxed to [phone].   New script has been printed and faxed to [place] at the number listed above. Confirmation fax has been received.

## 2018-10-08 NOTE — Telephone Encounter (Signed)
[name] has called asking if pt is still taking Interferon Beta-1a (AVONEX PEN) 30 MCG/0.5ML AJKT.  [facility] [name] confirmed pt is but was unavailable to take call from [employer].  Rep calling was thankful for information provided and is asking for a call from [name] to discuss  (AVONEX PEN) 30 MCG/0.5ML AJKT.

## 2018-10-08 NOTE — Addendum Note (Signed)
Addended by: Thomes Cake on: 10/08/2018 04:25 PM   Modules accepted: Orders

## 2018-10-11 ENCOUNTER — Other Ambulatory Visit: Payer: Self-pay

## 2018-10-11 ENCOUNTER — Encounter (HOSPITAL_COMMUNITY): Payer: Self-pay

## 2018-10-11 ENCOUNTER — Emergency Department (HOSPITAL_COMMUNITY): Payer: Medicare HMO

## 2018-10-11 ENCOUNTER — Inpatient Hospital Stay (HOSPITAL_COMMUNITY)
Admission: EM | Admit: 2018-10-11 | Discharge: 2018-10-25 | DRG: 475 | Disposition: A | Discharge status: Skilled Nursing Facility | Payer: Medicare HMO | Attending: Internal Medicine | Admitting: Internal Medicine

## 2018-10-11 DIAGNOSIS — R413 Other amnesia: Secondary | ICD-10-CM | POA: Diagnosis present

## 2018-10-11 DIAGNOSIS — G35D Multiple sclerosis, unspecified: Secondary | ICD-10-CM | POA: Diagnosis present

## 2018-10-11 DIAGNOSIS — G35 Multiple sclerosis: Secondary | ICD-10-CM | POA: Diagnosis present

## 2018-10-11 DIAGNOSIS — F29 Unspecified psychosis not due to a substance or known physiological condition: Secondary | ICD-10-CM | POA: Diagnosis not present

## 2018-10-11 DIAGNOSIS — N319 Neuromuscular dysfunction of bladder, unspecified: Secondary | ICD-10-CM | POA: Diagnosis present

## 2018-10-11 DIAGNOSIS — G9340 Encephalopathy, unspecified: Secondary | ICD-10-CM

## 2018-10-11 DIAGNOSIS — Z79899 Other long term (current) drug therapy: Secondary | ICD-10-CM

## 2018-10-11 DIAGNOSIS — F0390 Unspecified dementia without behavioral disturbance: Secondary | ICD-10-CM | POA: Diagnosis present

## 2018-10-11 DIAGNOSIS — Z7401 Bed confinement status: Secondary | ICD-10-CM | POA: Diagnosis not present

## 2018-10-11 DIAGNOSIS — F09 Unspecified mental disorder due to known physiological condition: Secondary | ICD-10-CM | POA: Diagnosis present

## 2018-10-11 DIAGNOSIS — S91011A Laceration without foreign body, right ankle, initial encounter: Secondary | ICD-10-CM | POA: Diagnosis present

## 2018-10-11 DIAGNOSIS — S88111A Complete traumatic amputation at level between knee and ankle, right lower leg, initial encounter: Secondary | ICD-10-CM

## 2018-10-11 DIAGNOSIS — G8220 Paraplegia, unspecified: Secondary | ICD-10-CM | POA: Diagnosis present

## 2018-10-11 DIAGNOSIS — Z8249 Family history of ischemic heart disease and other diseases of the circulatory system: Secondary | ICD-10-CM | POA: Diagnosis not present

## 2018-10-11 DIAGNOSIS — L89309 Pressure ulcer of unspecified buttock, unspecified stage: Secondary | ICD-10-CM | POA: Diagnosis not present

## 2018-10-11 DIAGNOSIS — Z820 Family history of epilepsy and other diseases of the nervous system: Secondary | ICD-10-CM | POA: Diagnosis not present

## 2018-10-11 DIAGNOSIS — Z23 Encounter for immunization: Secondary | ICD-10-CM | POA: Diagnosis present

## 2018-10-11 DIAGNOSIS — Z8349 Family history of other endocrine, nutritional and metabolic diseases: Secondary | ICD-10-CM

## 2018-10-11 DIAGNOSIS — L89319 Pressure ulcer of right buttock, unspecified stage: Secondary | ICD-10-CM | POA: Diagnosis present

## 2018-10-11 DIAGNOSIS — M86271 Subacute osteomyelitis, right ankle and foot: Secondary | ICD-10-CM | POA: Diagnosis present

## 2018-10-11 DIAGNOSIS — E1152 Type 2 diabetes mellitus with diabetic peripheral angiopathy with gangrene: Secondary | ICD-10-CM | POA: Diagnosis not present

## 2018-10-11 DIAGNOSIS — E1169 Type 2 diabetes mellitus with other specified complication: Secondary | ICD-10-CM | POA: Diagnosis not present

## 2018-10-11 DIAGNOSIS — K0889 Other specified disorders of teeth and supporting structures: Secondary | ICD-10-CM | POA: Diagnosis present

## 2018-10-11 DIAGNOSIS — E876 Hypokalemia: Secondary | ICD-10-CM | POA: Diagnosis present

## 2018-10-11 DIAGNOSIS — D509 Iron deficiency anemia, unspecified: Secondary | ICD-10-CM | POA: Diagnosis present

## 2018-10-11 DIAGNOSIS — M869 Osteomyelitis, unspecified: Secondary | ICD-10-CM | POA: Diagnosis not present

## 2018-10-11 DIAGNOSIS — I70261 Atherosclerosis of native arteries of extremities with gangrene, right leg: Secondary | ICD-10-CM | POA: Diagnosis not present

## 2018-10-11 DIAGNOSIS — W2209XA Striking against other stationary object, initial encounter: Secondary | ICD-10-CM | POA: Diagnosis present

## 2018-10-11 DIAGNOSIS — S88111D Complete traumatic amputation at level between knee and ankle, right lower leg, subsequent encounter: Secondary | ICD-10-CM | POA: Diagnosis not present

## 2018-10-11 DIAGNOSIS — I10 Essential (primary) hypertension: Secondary | ICD-10-CM | POA: Diagnosis present

## 2018-10-11 DIAGNOSIS — Z86718 Personal history of other venous thrombosis and embolism: Secondary | ICD-10-CM | POA: Diagnosis not present

## 2018-10-11 DIAGNOSIS — G500 Trigeminal neuralgia: Secondary | ICD-10-CM | POA: Diagnosis present

## 2018-10-11 DIAGNOSIS — L97319 Non-pressure chronic ulcer of right ankle with unspecified severity: Secondary | ICD-10-CM | POA: Diagnosis present

## 2018-10-11 DIAGNOSIS — L03115 Cellulitis of right lower limb: Secondary | ICD-10-CM | POA: Diagnosis not present

## 2018-10-11 DIAGNOSIS — L89329 Pressure ulcer of left buttock, unspecified stage: Secondary | ICD-10-CM | POA: Diagnosis present

## 2018-10-11 DIAGNOSIS — M2550 Pain in unspecified joint: Secondary | ICD-10-CM | POA: Diagnosis not present

## 2018-10-11 DIAGNOSIS — R5381 Other malaise: Secondary | ICD-10-CM | POA: Diagnosis present

## 2018-10-11 DIAGNOSIS — F0280 Dementia in other diseases classified elsewhere without behavioral disturbance: Secondary | ICD-10-CM | POA: Diagnosis not present

## 2018-10-11 DIAGNOSIS — Z4789 Encounter for other orthopedic aftercare: Secondary | ICD-10-CM | POA: Diagnosis not present

## 2018-10-11 DIAGNOSIS — L089 Local infection of the skin and subcutaneous tissue, unspecified: Secondary | ICD-10-CM | POA: Diagnosis not present

## 2018-10-11 DIAGNOSIS — E785 Hyperlipidemia, unspecified: Secondary | ICD-10-CM | POA: Diagnosis present

## 2018-10-11 DIAGNOSIS — G8918 Other acute postprocedural pain: Secondary | ICD-10-CM | POA: Diagnosis not present

## 2018-10-11 DIAGNOSIS — T148XXA Other injury of unspecified body region, initial encounter: Secondary | ICD-10-CM | POA: Diagnosis not present

## 2018-10-11 DIAGNOSIS — Z91013 Allergy to seafood: Secondary | ICD-10-CM

## 2018-10-11 DIAGNOSIS — S91301A Unspecified open wound, right foot, initial encounter: Secondary | ICD-10-CM | POA: Diagnosis not present

## 2018-10-11 DIAGNOSIS — I96 Gangrene, not elsewhere classified: Secondary | ICD-10-CM | POA: Diagnosis present

## 2018-10-11 DIAGNOSIS — R600 Localized edema: Secondary | ICD-10-CM | POA: Diagnosis not present

## 2018-10-11 DIAGNOSIS — Z888 Allergy status to other drugs, medicaments and biological substances status: Secondary | ICD-10-CM

## 2018-10-11 HISTORY — DX: Local infection of the skin and subcutaneous tissue, unspecified: L08.9

## 2018-10-11 HISTORY — DX: Other injury of unspecified body region, initial encounter: T14.8XXA

## 2018-10-11 LAB — COMPREHENSIVE METABOLIC PANEL
ALBUMIN: 3.1 g/dL — AB (ref 3.5–5.0)
ALT: 23 U/L (ref 0–44)
AST: 27 U/L (ref 15–41)
Alkaline Phosphatase: 72 U/L (ref 38–126)
Anion gap: 9 (ref 5–15)
BILIRUBIN TOTAL: 0.2 mg/dL — AB (ref 0.3–1.2)
BUN: 15 mg/dL (ref 8–23)
CO2: 23 mmol/L (ref 22–32)
Calcium: 9.1 mg/dL (ref 8.9–10.3)
Chloride: 109 mmol/L (ref 98–111)
Creatinine, Ser: 0.76 mg/dL (ref 0.44–1.00)
GFR calc Af Amer: >60 mL/min (ref >60)
GFR calc non Af Amer: >60 mL/min (ref >60)
Glucose, Bld: 101 mg/dL — ABNORMAL HIGH (ref 70–99)
POTASSIUM: 4.5 mmol/L (ref 3.5–5.1)
Sodium: 141 mmol/L (ref 135–145)
Total Protein: 7.3 g/dL (ref 6.5–8.1)

## 2018-10-11 LAB — CBC WITH DIFFERENTIAL/PLATELET
Abs Immature Granulocytes: 0.04 10*3/uL (ref 0.00–0.07)
BASOS ABS: 0 10*3/uL (ref 0.0–0.1)
Basophils Relative: 0 %
EOS ABS: 0 10*3/uL (ref 0.0–0.5)
Eosinophils Relative: 0 %
HCT: 37.1 % (ref 36.0–46.0)
Hemoglobin: 11.2 g/dL — ABNORMAL LOW (ref 12.0–15.0)
IMMATURE GRANULOCYTES: 1 %
Lymphocytes Relative: 19 %
Lymphs Abs: 1.4 10*3/uL (ref 0.7–4.0)
MCH: 30.4 pg (ref 26.0–34.0)
MCHC: 30.2 g/dL (ref 30.0–36.0)
MCV: 100.8 fL — ABNORMAL HIGH (ref 80.0–100.0)
Monocytes Absolute: 1.2 10*3/uL — ABNORMAL HIGH (ref 0.1–1.0)
Monocytes Relative: 17 %
NEUTROS PCT: 63 %
NRBC: 0 % (ref 0.0–0.2)
Neutro Abs: 4.5 10*3/uL (ref 1.7–7.7)
Platelets: 218 10*3/uL (ref 150–400)
RBC: 3.68 MIL/uL — ABNORMAL LOW (ref 3.87–5.11)
RDW: 12.8 % (ref 11.5–15.5)
WBC: 7.3 10*3/uL (ref 4.0–10.5)

## 2018-10-11 LAB — I-STAT CG4 LACTIC ACID, ED: Lactic Acid, Venous: 1.68 mmol/L (ref 0.5–1.9)

## 2018-10-11 MED ORDER — SODIUM CHLORIDE 0.9 % IV SOLN
2.0000 g | Freq: Once | INTRAVENOUS | Status: AC
  Administered 2018-10-11: 2 g via INTRAVENOUS
  Filled 2018-10-11: qty 20

## 2018-10-11 MED ORDER — VANCOMYCIN HCL 10 G IV SOLR
1250.0000 mg | Freq: Once | INTRAVENOUS | Status: AC
  Administered 2018-10-12: 1250 mg via INTRAVENOUS
  Filled 2018-10-11: qty 1250

## 2018-10-11 NOTE — ED Provider Notes (Signed)
[facility] DEPART[facility] Provider Note   CSN: [id number] Arrival date & time: 10/11/18  1331     History   Chief Complaint Chief Complaint  Patient presents with  . Wound Infection    HPI [name] is a 73 y.o. female.  HPI   Patient is a 73 year old female with a past medical history of HTN, HDL, trigeminal neuralgia, MS, depression, abnormal gait and memory disturbance, and left lower extremity DVT who presents for evaluation of a wound to her right ankle.  She is accompanied by her son who provides most of the history as she is unable provide any significant history secondary to her reported MS.  Per son patient struck her right ankle against a door frame while he was attempting to move her to the bathroom approximately 2 weeks ago.  Since then he has noticed a black ulcer on her right ankle that has not improved.  Patient has not seen a doctor in the interim.  She has not had any fevers, vomiting, diarrhea, cough, trouble breathing, or other acute concerns.  Patient denies any other acute concerns other than pain in her right ankle although history is somewhat limited from her.  Past Medical History:  Diagnosis Date  . Abnormality of gait   . Depression   . DVT (deep venous thrombosis) (HCC)    Left leg  . Dyslipidemia   . Fibroids   . Hypertension   . Memory difficulties 12/10/2014  . Multiple sclerosis (HCC)    Right sided weakness and gait disorder, bladder and bowel problems   . Neurogenic bladder   . 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 Overdose of muscle relaxant    Unintentional baclofen overdose   . Trigeminal neuralgia   . Trigeminal neuralgia    Right    Patient Active Problem List   Diagnosis Date Noted  . Neurogenic bladder 06/16/2016  . Memory difficulties 12/10/2014  . Gait disorder 07/01/2013  . Tooth pain 11/02/2011  . Protein calorie malnutrition ([place]) 10/07/2011  . Constipation 09/23/2011  . Failure to thrive  in childhood 09/22/2011  . Decubitus ulcer, buttock 09/22/2011  . Fibroid uterus 09/22/2011  . Abdominal pain, lower 09/22/2011  . Multiple sclerosis ([place])   . Hypertension   . Depression   . Organic brain syndrome   . Trigeminal neuralgia     Past Surgical History:  Procedure Laterality Date  . gamma knife procedure for rt. trigeminal neuralgia Right   . left leg DVT Left   . STRABISMUS SURGERY     Right eye      OB History   No obstetric history on file.      Home Medications    Prior to Admission medications   Medication Sig Start Date End Date Taking? Authorizing Provider  acetaminophen (TYLENOL) 500 MG tablet Take 500 mg by mouth as needed.    [provider]  amLODipine (NORVASC) 10 MG tablet Take 1 tablet (10 mg total) by mouth daily. 11/03/11   Deneise Lever, MD  APPLE CIDER VINEGAR PO Take 1 capsule by mouth daily. 43m    [provider]  atorvastatin (LIPITOR) 40 MG tablet Take 40 mg by mouth daily.      [provider]  baclofen (LIORESAL) 10 MG tablet ONE TABLET TWICE DURING THE DAY AND 2 AT NIGHT 07/18/18   WKathrynn Ducking MD  BLACK CURRANT SEED OIL PO Take 1 capsule by mouth  daily.    [provider]  buPROPion (WELLBUTRIN XL) 300 MG 24 hr tablet Take 300 mg by mouth daily.    Deneise Lever, MD  carbamazepine (TEGRETOL) 200 MG tablet TAKE 1 TABLET BY MOUTH 3 TIMES A DAY AND 1/2 TABLET AT BEDTIME AT BEDTIME 09/24/18   Kathrynn Ducking, MD  Cholecalciferol ([employer] VIT D 5000 HIGH-POTENCY PO) Take 5,000 Int'l Units/L by mouth daily.    [provider]  clotrimazole (MYCELEX) 10 MG troche Take 1 tablet (10 mg total) by mouth 5 (five) times daily. 08/31/18   Kathrynn Ducking, MD  dexamethasone (DECADRON) 2 MG tablet Begin taking 6 tablets daily, taper by one tablet daily until off the medication. 08/31/18   Kathrynn Ducking, MD  donepezil (ARICEPT) 5 MG tablet TAKE 1 TABLET (5 MG TOTAL) BY MOUTH DAILY. 03/12/18    Kathrynn Ducking, MD  FIBER SELECT GUMMIES PO Take 5 g by mouth 2 (two) times daily.     [provider]  fish oil-omega-3 fatty acids 1000 MG capsule Take 2 g by mouth 3 (three) times daily.    Deneise Lever, MD  Interferon Beta-1a (AVONEX PEN) 30 MCG/0.5ML AJKT Inject 30 mcg into the muscle every 7 (seven) days. 10/08/18   Ward Givens, NP  labetalol (NORMODYNE) 100 MG tablet Take 100 mg by mouth 2 (two) times daily.    Deneise Lever, MD  levETIRAcetam (KEPPRA) 500 MG tablet Take 1 tablet (500 mg total) by mouth 2 (two) times daily. 09/04/18   Kathrynn Ducking, MD  losartan-hydrochlorothiazide [facility]) 100-12.5 MG per tablet  12/09/14   [provider]  Multiple Vitamins-Minerals (MULTIVITAMINS THER. W/MINERALS) TABS Take 1 tablet by mouth daily.      [provider]  petrolatum-hydrophilic-aloe vera (ALOE VESTA) ointment Apply topically 2 (two) times daily as needed for wound care. 04/06/17   Lysbeth Penner, FNP  Potassium Chloride (KLOR-CON PO) Take 20 mEq by mouth.    [provider]  pregabalin (LYRICA) 150 MG capsule Take 1 capsule (150 mg total) by mouth 2 (two) times daily. 08/29/18   Kathrynn Ducking, MD  sertraline (ZOLOFT) 50 MG tablet Take 50 mg by mouth daily. One and one half tablet once a day in morning.    [provider]  Skin Protectants, Misc. (WHITE PETROLATUM-ZINC OXIDE) cream Apply topically as needed. 04/06/17   Lysbeth Penner, FNP  tolterodine (DETROL LA) 2 MG 24 hr capsule TAKE 1 CAPSULE BY MOUTH EVERYDAY AT BEDTIME 09/18/18   Kathrynn Ducking, MD  traMADol (ULTRAM) 50 MG tablet TAKE 1 TABLET BY MOUTH EVERY 4-6 AS NEEDED MUST LAST 28 DAYS 12/26/16   Kathrynn Ducking, MD    Family History Family History  Problem Relation Age of Onset  . Multiple sclerosis Sister   . Cervical cancer Mother   . Liver disease Father   . Hyperlipidemia Brother   . Pancreatitis Brother   . Hypertension Other   . Heart disease  Other   . Uterine cancer Other     Social History Social History   Tobacco Use  . Smoking status: Never Smoker  . Smokeless tobacco: Never Used  Substance Use Topics  . Alcohol use: No  . Drug use: No     Allergies   Prednisone and Shellfish allergy   Review of Systems Review of Systems  Constitutional: Negative for chills and fever.  HENT: Negative for ear pain and sore throat.  Eyes: Negative for pain and visual disturbance.  Respiratory: Negative for cough and shortness of breath.   Cardiovascular: Negative for chest pain and palpitations.  Gastrointestinal: Negative for abdominal pain and vomiting.  Genitourinary: Negative for dysuria and hematuria.  Musculoskeletal: Negative for arthralgias and back pain.  Skin: Positive for wound ( R ankle). Negative for color change and rash.  Neurological: Negative for seizures and syncope.  All other systems reviewed and are negative.  Physical Exam Updated Vital Signs BP 117/66   Pulse 83   Temp 98.2 F (36.8 C) (Oral)   Resp 18   SpO2 100%   Physical Exam Vitals signs and nursing note reviewed.  Constitutional:      General: She is not in acute distress.    Appearance: She is well-developed.  HENT:     Head: Normocephalic and atraumatic.     Right Ear: External ear normal.     Left Ear: External ear normal.     Nose: Nose normal.     Mouth/Throat:     Mouth: Mucous membranes are moist.  Eyes:     Conjunctiva/sclera: Conjunctivae normal.  Neck:     Musculoskeletal: Neck supple.  Cardiovascular:     Rate and Rhythm: Normal rate and regular rhythm.     Pulses:          Radial pulses are 2+ on the right side and 2+ on the left side.       Dorsalis pedis pulses are 2+ on the right side and 2+ on the left side.     Heart sounds: No murmur.  Pulmonary:     Effort: Pulmonary effort is normal. No respiratory distress.     Breath sounds: Normal breath sounds.  Abdominal:     Palpations: Abdomen is soft.      Tenderness: There is no abdominal tenderness.  Musculoskeletal:     Right lower leg: Edema present.  Skin:    General: Skin is warm and dry.     Capillary Refill: Capillary refill takes less than 2 seconds.  Neurological:     Mental Status: She is alert. Mental status is at baseline.    There is a circular ulcer over the right lateral malleolus that has a black center with surrounding erythema, edema, and warmth compared to the opposite side.  Patient does not follow commands in her bilateral lower extremities.  He does follow commands in her upper extremities.  Per son this is her baseline.  ED Treatments / Results  Labs (all labs ordered are listed, but only abnormal results are displayed) Labs Reviewed  COMPREHENSIVE METABOLIC PANEL - Abnormal; Notable for the following components:      Result Value   Glucose, Bld 101 (*)    Albumin 3.1 (*)    Total Bilirubin 0.2 (*)    All other components within normal limits  CBC WITH DIFFERENTIAL/PLATELET - Abnormal; Notable for the following components:   RBC 3.68 (*)    Hemoglobin 11.2 (*)    MCV 100.8 (*)    Monocytes Absolute 1.2 (*)    All other components within normal limits  CULTURE, BLOOD (ROUTINE X 2)  CULTURE, BLOOD (ROUTINE X 2)  SEDIMENTATION RATE  C-REACTIVE PROTEIN  I-STAT CG4 LACTIC ACID, ED    EKG None  Radiology Dg Tibia/fibula Right  Result Date: 10/11/2018 CLINICAL DATA:  Ankle wound. Struck foot on wooden door 2 weeks ago. Redness around wound spreading up leg. EXAM: RIGHT TIBIA AND FIBULA - 2 VIEW  COMPARISON:  Concurrent ankle in foot radiographs. FINDINGS: Soft tissue defect about the lateral ankle, better assessed on concurrent ankle radiographs. Diffuse soft tissue edema of the lower leg. No fracture, dislocation, or bony destructive change. Degenerative change of the knee is partially included. IMPRESSION: 1. Soft tissue defect/wound about the lateral ankle, better assessed on concurrent ankle radiographs.  Diffuse soft tissue edema of the lower leg. 2. No acute osseous abnormality. Electronically Signed   By: Keith Rake M.D.   On: 10/11/2018 23:06   Dg Ankle Complete Right  Result Date: 10/11/2018 CLINICAL DATA:  Ankle wound. Struck foot on wooden door 2 weeks ago. Redness around wound spreading up leg. EXAM: RIGHT ANKLE - COMPLETE 3+ VIEW COMPARISON:  None. FINDINGS: Soft tissue defect about the lateral ankle. No tracking soft tissue air. Diffuse soft tissue edema about the ankle. No bony destructive change, periosteal reaction, fracture or dislocation. The ankle mortise is preserved. IMPRESSION: Soft tissue defect about the lateral ankle with diffuse soft tissue edema. No tracking soft tissue air. No radiographic findings of osteomyelitis or acute osseous abnormality. Electronically Signed   By: Keith Rake M.D.   On: 10/11/2018 23:07   Dg Foot Complete Right  Result Date: 10/11/2018 CLINICAL DATA:  Ankle wound. Struck foot on wooden door 2 weeks ago. Redness around wound spreading up leg. EXAM: RIGHT FOOT COMPLETE - 3+ VIEW COMPARISON:  Concurrent ankle radiographs. FINDINGS: Soft tissue defect about the lateral ankle better assessed on concurrent ankle exam. Diffuse soft tissue edema about the foot. No fracture, dislocation, or bony destructive change. Scattered osteoarthritis in the midfoot and about the digits. IMPRESSION: Soft tissue defect about the lateral ankle better assessed on concurrent ankle radiographs. Diffuse soft tissue edema of the foot. No acute osseous abnormality. Electronically Signed   By: Keith Rake M.D.   On: 10/11/2018 23:08    Procedures Procedures (including critical care time)  Medications Ordered in ED Medications  vancomycin (VANCOCIN) 1,250 mg in sodium chloride 0.9 % 250 mL IVPB (has no administration in time range)  cefTRIAXone (ROCEPHIN) 2 g in sodium chloride 0.9 % 100 mL IVPB (2 g Intravenous New Bag/Given 10/11/18 2345)     Initial Impression /  Assessment and Plan / ED Course  I have reviewed the triage vital signs and the nursing notes.  Pertinent labs & imaging results that were available during my care of the patient were reviewed by me and considered in my medical decision making (see chart for details).     Patient is a 73 year old female who presents above-stated history exam.  On presentation patient is afebrile stable vital signs.  Exam as above remarkable for an ulcer over the lateral malleolus of the right foot with surrounding erythema, edema, and warmth.  X-rays of the patient's right lower extremity were obtained that did not show any acute fractures or dislocations and findings consistent with edema.  No obvious findings consistent with osteomyelitis.  CBC shows WBC of 7.3, hemoglobin 11.2, platelets 218.  CMP showed no significant electrolyte or metabolic abnormalities aside from an albumin of 3.1.  Lactic acid 1.68.  Patient given IV antibiotics in emergency department for cellulitis.  There is some concern for very early osteomyelitis so CRP, ESR, and MR of the right foot were ordered.  History exam is not consistent with acute traumatic injury, necrotizing fasciitis, sepsis, or DVT.  Patient admitted in stable condition to hospitalist service to await MRI and further management of significant cellulitis of the right foot.  Final Clinical Impressions(s) / ED Diagnoses   Final diagnoses:  Cellulitis of right lower extremity    ED Discharge Orders    None       Hulan Saas, MD 10/11/18 [id number]    Sherwood Gambler, MD 10/12/18 [phone]

## 2018-10-11 NOTE — ED Triage Notes (Signed)
Pt has necrotic wound to lateral side of right ankle. Pt hit her foot on a wooden door 2 weeks ago and the would is now black in the center with yellow drainage. Redness around the wound spreading up her leg.

## 2018-10-12 ENCOUNTER — Emergency Department (HOSPITAL_COMMUNITY): Payer: Medicare HMO

## 2018-10-12 ENCOUNTER — Encounter (HOSPITAL_COMMUNITY): Payer: Self-pay | Admitting: General Practice

## 2018-10-12 DIAGNOSIS — M86271 Subacute osteomyelitis, right ankle and foot: Secondary | ICD-10-CM | POA: Diagnosis present

## 2018-10-12 DIAGNOSIS — F09 Unspecified mental disorder due to known physiological condition: Secondary | ICD-10-CM | POA: Diagnosis present

## 2018-10-12 DIAGNOSIS — Z8249 Family history of ischemic heart disease and other diseases of the circulatory system: Secondary | ICD-10-CM | POA: Diagnosis not present

## 2018-10-12 DIAGNOSIS — I10 Essential (primary) hypertension: Secondary | ICD-10-CM

## 2018-10-12 DIAGNOSIS — T148XXA Other injury of unspecified body region, initial encounter: Secondary | ICD-10-CM | POA: Diagnosis not present

## 2018-10-12 DIAGNOSIS — L89319 Pressure ulcer of right buttock, unspecified stage: Secondary | ICD-10-CM | POA: Diagnosis present

## 2018-10-12 DIAGNOSIS — N319 Neuromuscular dysfunction of bladder, unspecified: Secondary | ICD-10-CM | POA: Diagnosis present

## 2018-10-12 DIAGNOSIS — G35 Multiple sclerosis: Secondary | ICD-10-CM | POA: Diagnosis present

## 2018-10-12 DIAGNOSIS — G500 Trigeminal neuralgia: Secondary | ICD-10-CM | POA: Diagnosis present

## 2018-10-12 DIAGNOSIS — L89309 Pressure ulcer of unspecified buttock, unspecified stage: Secondary | ICD-10-CM | POA: Diagnosis not present

## 2018-10-12 DIAGNOSIS — L97319 Non-pressure chronic ulcer of right ankle with unspecified severity: Secondary | ICD-10-CM | POA: Diagnosis present

## 2018-10-12 DIAGNOSIS — R413 Other amnesia: Secondary | ICD-10-CM

## 2018-10-12 DIAGNOSIS — Z8349 Family history of other endocrine, nutritional and metabolic diseases: Secondary | ICD-10-CM | POA: Diagnosis not present

## 2018-10-12 DIAGNOSIS — L089 Local infection of the skin and subcutaneous tissue, unspecified: Secondary | ICD-10-CM | POA: Diagnosis not present

## 2018-10-12 DIAGNOSIS — R5381 Other malaise: Secondary | ICD-10-CM | POA: Diagnosis present

## 2018-10-12 DIAGNOSIS — E785 Hyperlipidemia, unspecified: Secondary | ICD-10-CM | POA: Diagnosis present

## 2018-10-12 DIAGNOSIS — Z86718 Personal history of other venous thrombosis and embolism: Secondary | ICD-10-CM | POA: Diagnosis not present

## 2018-10-12 DIAGNOSIS — E876 Hypokalemia: Secondary | ICD-10-CM | POA: Diagnosis present

## 2018-10-12 DIAGNOSIS — D509 Iron deficiency anemia, unspecified: Secondary | ICD-10-CM | POA: Diagnosis present

## 2018-10-12 DIAGNOSIS — L03115 Cellulitis of right lower limb: Secondary | ICD-10-CM | POA: Diagnosis not present

## 2018-10-12 DIAGNOSIS — Z820 Family history of epilepsy and other diseases of the nervous system: Secondary | ICD-10-CM | POA: Diagnosis not present

## 2018-10-12 DIAGNOSIS — Z23 Encounter for immunization: Secondary | ICD-10-CM | POA: Diagnosis present

## 2018-10-12 DIAGNOSIS — S91011A Laceration without foreign body, right ankle, initial encounter: Secondary | ICD-10-CM | POA: Diagnosis present

## 2018-10-12 DIAGNOSIS — I96 Gangrene, not elsewhere classified: Secondary | ICD-10-CM | POA: Diagnosis present

## 2018-10-12 DIAGNOSIS — Z7401 Bed confinement status: Secondary | ICD-10-CM | POA: Diagnosis not present

## 2018-10-12 DIAGNOSIS — W2209XA Striking against other stationary object, initial encounter: Secondary | ICD-10-CM | POA: Diagnosis present

## 2018-10-12 DIAGNOSIS — L89329 Pressure ulcer of left buttock, unspecified stage: Secondary | ICD-10-CM | POA: Diagnosis present

## 2018-10-12 DIAGNOSIS — K0889 Other specified disorders of teeth and supporting structures: Secondary | ICD-10-CM | POA: Diagnosis present

## 2018-10-12 DIAGNOSIS — G8220 Paraplegia, unspecified: Secondary | ICD-10-CM | POA: Diagnosis present

## 2018-10-12 DIAGNOSIS — S91301A Unspecified open wound, right foot, initial encounter: Secondary | ICD-10-CM | POA: Diagnosis not present

## 2018-10-12 DIAGNOSIS — F0390 Unspecified dementia without behavioral disturbance: Secondary | ICD-10-CM | POA: Diagnosis present

## 2018-10-12 HISTORY — DX: Local infection of the skin and subcutaneous tissue, unspecified: L08.9

## 2018-10-12 LAB — BASIC METABOLIC PANEL
Anion gap: 9 (ref 5–15)
BUN: 17 mg/dL (ref 8–23)
CHLORIDE: 107 mmol/L (ref 98–111)
CO2: 25 mmol/L (ref 22–32)
Calcium: 8.8 mg/dL — ABNORMAL LOW (ref 8.9–10.3)
Creatinine, Ser: 0.82 mg/dL (ref 0.44–1.00)
GFR calc Af Amer: >60 mL/min (ref >60)
GFR calc non Af Amer: >60 mL/min (ref >60)
Glucose, Bld: 101 mg/dL — ABNORMAL HIGH (ref 70–99)
Potassium: 3.6 mmol/L (ref 3.5–5.1)
Sodium: 141 mmol/L (ref 135–145)

## 2018-10-12 LAB — CBC
HEMATOCRIT: 31.1 % — AB (ref 36.0–46.0)
Hemoglobin: 10.3 g/dL — ABNORMAL LOW (ref 12.0–15.0)
MCH: 31.8 pg (ref 26.0–34.0)
MCHC: 33.1 g/dL (ref 30.0–36.0)
MCV: 96 fL (ref 80.0–100.0)
Platelets: 226 10*3/uL (ref 150–400)
RBC: 3.24 MIL/uL — ABNORMAL LOW (ref 3.87–5.11)
RDW: 12.9 % (ref 11.5–15.5)
WBC: 5.5 10*3/uL (ref 4.0–10.5)
nRBC: 0 % (ref 0.0–0.2)

## 2018-10-12 LAB — VITAMIN B12: Vitamin B-12: 1624 pg/mL — ABNORMAL HIGH (ref 180–914)

## 2018-10-12 LAB — SEDIMENTATION RATE: Sed Rate: 93 mm/hr — ABNORMAL HIGH (ref 0–22)

## 2018-10-12 LAB — C-REACTIVE PROTEIN: CRP: 9.8 mg/dL — ABNORMAL HIGH (ref <1.0)

## 2018-10-12 MED ORDER — ALBUTEROL SULFATE (2.5 MG/3ML) 0.083% IN NEBU: 2.5000 mg | INHALATION_SOLUTION | Freq: Four times a day (QID) | RESPIRATORY_TRACT | Status: DC | PRN

## 2018-10-12 MED ORDER — ONDANSETRON HCL 4 MG PO TABS: 4.0000 mg | ORAL_TABLET | Freq: Four times a day (QID) | ORAL | Status: DC | PRN

## 2018-10-12 MED ORDER — ENOXAPARIN SODIUM 40 MG/0.4ML ~~LOC~~ SOLN
40.0000 mg | Freq: Every day | SUBCUTANEOUS | Status: DC
  Administered 2018-10-12 – 2018-10-25 (×13): 40 mg via SUBCUTANEOUS
  Filled 2018-10-12 (×13): qty 0.4

## 2018-10-12 MED ORDER — LOSARTAN POTASSIUM-HCTZ 100-12.5 MG PO TABS: 1.0000 | ORAL_TABLET | Freq: Every day | ORAL | Status: DC

## 2018-10-12 MED ORDER — BACLOFEN 10 MG PO TABS
10.0000 mg | ORAL_TABLET | Freq: Three times a day (TID) | ORAL | Status: DC
  Administered 2018-10-12 – 2018-10-25 (×40): 10 mg via ORAL
  Filled 2018-10-12 (×41): qty 1

## 2018-10-12 MED ORDER — VANCOMYCIN HCL IN DEXTROSE 750-5 MG/150ML-% IV SOLN
750.0000 mg | INTRAVENOUS | Status: DC
  Administered 2018-10-13: 750 mg via INTRAVENOUS
  Filled 2018-10-12: qty 150

## 2018-10-12 MED ORDER — HYDROCHLOROTHIAZIDE 12.5 MG PO CAPS
12.5000 mg | ORAL_CAPSULE | Freq: Every day | ORAL | Status: DC
  Administered 2018-10-12: 12.5 mg via ORAL
  Filled 2018-10-12: qty 1

## 2018-10-12 MED ORDER — FESOTERODINE FUMARATE ER 4 MG PO TB24
4.0000 mg | ORAL_TABLET | Freq: Every day | ORAL | Status: DC
  Administered 2018-10-13 – 2018-10-25 (×13): 4 mg via ORAL
  Filled 2018-10-12 (×14): qty 1

## 2018-10-12 MED ORDER — CLOTRIMAZOLE 10 MG MT TROC: 10.0000 mg | Freq: Every day | OROMUCOSAL | Status: DC

## 2018-10-12 MED ORDER — HYDROCERIN EX CREA: 1.0000 "application " | TOPICAL_CREAM | Freq: Every day | CUTANEOUS | Status: DC | PRN

## 2018-10-12 MED ORDER — SODIUM CHLORIDE 0.9 % IV SOLN
2.0000 g | INTRAVENOUS | Status: AC
  Administered 2018-10-12 – 2018-10-18 (×7): 2 g via INTRAVENOUS
  Filled 2018-10-12 (×7): qty 20

## 2018-10-12 MED ORDER — ATORVASTATIN CALCIUM 40 MG PO TABS
40.0000 mg | ORAL_TABLET | Freq: Every day | ORAL | Status: DC
  Administered 2018-10-12 – 2018-10-25 (×14): 40 mg via ORAL
  Filled 2018-10-12 (×14): qty 1

## 2018-10-12 MED ORDER — WHITE PETROLATUM EX OINT
TOPICAL_OINTMENT | Freq: Two times a day (BID) | CUTANEOUS | Status: DC | PRN
  Administered 2018-10-23: 0.2 via TOPICAL
  Filled 2018-10-12: qty 28.35

## 2018-10-12 MED ORDER — ONDANSETRON HCL 4 MG/2ML IJ SOLN: 4.0000 mg | Freq: Four times a day (QID) | INTRAMUSCULAR | Status: DC | PRN

## 2018-10-12 MED ORDER — ACETAMINOPHEN 325 MG PO TABS
650.0000 mg | ORAL_TABLET | Freq: Four times a day (QID) | ORAL | Status: DC | PRN
  Administered 2018-10-16 – 2018-10-22 (×5): 650 mg via ORAL
  Filled 2018-10-12 (×3): qty 2

## 2018-10-12 MED ORDER — LOSARTAN POTASSIUM 50 MG PO TABS
100.0000 mg | ORAL_TABLET | Freq: Every day | ORAL | Status: DC
  Administered 2018-10-12: 100 mg via ORAL
  Filled 2018-10-12 (×2): qty 2

## 2018-10-12 MED ORDER — ACETAMINOPHEN 500 MG PO TABS
1000.0000 mg | ORAL_TABLET | ORAL | Status: AC
  Administered 2018-10-12: 1000 mg via ORAL
  Filled 2018-10-12: qty 2

## 2018-10-12 MED ORDER — CARBAMAZEPINE 200 MG PO TABS: 200.0000 mg | ORAL_TABLET | Freq: Three times a day (TID) | ORAL | Status: DC

## 2018-10-12 MED ORDER — SODIUM CHLORIDE 0.45 % IV SOLN
INTRAVENOUS | Status: DC
  Administered 2018-10-12 – 2018-10-19 (×8): via INTRAVENOUS

## 2018-10-12 MED ORDER — AMLODIPINE BESYLATE 5 MG PO TABS
5.0000 mg | ORAL_TABLET | Freq: Two times a day (BID) | ORAL | Status: DC
  Administered 2018-10-12 – 2018-10-15 (×6): 5 mg via ORAL
  Filled 2018-10-12 (×6): qty 1

## 2018-10-12 MED ORDER — LABETALOL HCL 100 MG PO TABS
100.0000 mg | ORAL_TABLET | Freq: Two times a day (BID) | ORAL | Status: DC
  Administered 2018-10-12 – 2018-10-25 (×26): 100 mg via ORAL
  Filled 2018-10-12 (×28): qty 1

## 2018-10-12 MED ORDER — ACETAMINOPHEN 650 MG RE SUPP: 650.0000 mg | Freq: Four times a day (QID) | RECTAL | Status: DC | PRN

## 2018-10-12 MED ORDER — CARBAMAZEPINE 100 MG PO CHEW
200.0000 mg | CHEWABLE_TABLET | Freq: Two times a day (BID) | ORAL | Status: DC
  Administered 2018-10-12 – 2018-10-25 (×27): 200 mg via ORAL
  Filled 2018-10-12 (×29): qty 2

## 2018-10-12 MED ORDER — GADOBUTROL 1 MMOL/ML IV SOLN
8.0000 mL | Freq: Once | INTRAVENOUS | Status: AC | PRN
  Administered 2018-10-12: 8 mL via INTRAVENOUS

## 2018-10-12 MED ORDER — BUPROPION HCL ER (XL) 150 MG PO TB24
300.0000 mg | ORAL_TABLET | Freq: Every day | ORAL | Status: DC
  Administered 2018-10-12 – 2018-10-25 (×14): 300 mg via ORAL
  Filled 2018-10-12 (×14): qty 2

## 2018-10-12 MED ORDER — PREGABALIN 75 MG PO CAPS
150.0000 mg | ORAL_CAPSULE | Freq: Two times a day (BID) | ORAL | Status: DC
  Administered 2018-10-12 – 2018-10-25 (×27): 150 mg via ORAL
  Filled 2018-10-12 (×27): qty 2

## 2018-10-12 MED ORDER — TRAMADOL HCL 50 MG PO TABS
50.0000 mg | ORAL_TABLET | Freq: Four times a day (QID) | ORAL | Status: DC | PRN
  Administered 2018-10-13 – 2018-10-25 (×12): 50 mg via ORAL
  Filled 2018-10-12 (×13): qty 1

## 2018-10-12 MED ORDER — ACETAMINOPHEN 500 MG PO TABS: 500.0000 mg | ORAL_TABLET | Freq: Four times a day (QID) | ORAL | Status: DC | PRN

## 2018-10-12 MED ORDER — HYDRALAZINE HCL 10 MG PO TABS
10.0000 mg | ORAL_TABLET | Freq: Two times a day (BID) | ORAL | Status: DC
  Administered 2018-10-12 (×2): 10 mg via ORAL
  Filled 2018-10-12 (×3): qty 1

## 2018-10-12 MED ORDER — SERTRALINE HCL 50 MG PO TABS
75.0000 mg | ORAL_TABLET | Freq: Every day | ORAL | Status: DC
  Administered 2018-10-12 – 2018-10-25 (×14): 75 mg via ORAL
  Filled 2018-10-12 (×14): qty 2
  Filled 2018-10-12: qty 1
  Filled 2018-10-12: qty 2

## 2018-10-12 MED ORDER — DONEPEZIL HCL 5 MG PO TABS
5.0000 mg | ORAL_TABLET | Freq: Every day | ORAL | Status: DC
  Administered 2018-10-12 – 2018-10-25 (×14): 5 mg via ORAL
  Filled 2018-10-12 (×15): qty 1

## 2018-10-12 MED ORDER — AMLODIPINE BESYLATE 10 MG PO TABS
10.0000 mg | ORAL_TABLET | Freq: Every day | ORAL | Status: DC
  Administered 2018-10-12: 10 mg via ORAL
  Filled 2018-10-12: qty 1

## 2018-10-12 MED ORDER — CARBAMAZEPINE 100 MG PO CHEW
300.0000 mg | CHEWABLE_TABLET | Freq: Every day | ORAL | Status: DC
  Administered 2018-10-12 – 2018-10-25 (×14): 300 mg via ORAL
  Filled 2018-10-12 (×14): qty 3

## 2018-10-12 MED ORDER — ZINC OXIDE 40 % EX OINT
TOPICAL_OINTMENT | Freq: Four times a day (QID) | CUTANEOUS | Status: DC
  Administered 2018-10-12 – 2018-10-18 (×24): via TOPICAL
  Administered 2018-10-18: 1 via TOPICAL
  Administered 2018-10-19 (×4): via TOPICAL
  Administered 2018-10-20 (×2): 1 via TOPICAL
  Administered 2018-10-20 – 2018-10-21 (×4): via TOPICAL
  Administered 2018-10-21: 1 via TOPICAL
  Administered 2018-10-21 – 2018-10-25 (×12): via TOPICAL
  Filled 2018-10-12 (×2): qty 57

## 2018-10-12 NOTE — Consult Note (Signed)
Reason for Consult:Right ankle ulcer w/osteo Referring Physician: Jeanie Sewer  [name] is an 73 y.o. female.  HPI: [name] has suffered with a right ankle wound for about 2 weeks after it was hit on a door frame during a transfer. It initially seemed to be getting better but then noticed foul smelling drainage coming from wound and brought her to ED for evaluation. She was admitted and MRI was obtained that showed osteo of the lateral mal and orthopedic surgery was consulted. She denies pain. She has some dementia and trouble verbalizing so history was limited to what I could gather from the chart. It appears she is not ambulatory at baseline.  Past Medical History:  Diagnosis Date  . Abnormality of gait   . Depression   . DVT (deep venous thrombosis) (HCC)    Left leg  . Dyslipidemia   . Fibroids   . Hypertension   . Memory difficulties 12/10/2014  . Multiple sclerosis (HCC)    Right sided weakness and gait disorder, bladder and bowel problems   . Neurogenic bladder   . 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 Overdose of muscle relaxant    Unintentional baclofen overdose   . Trigeminal neuralgia   . Trigeminal neuralgia    Right  . Wound infection 10/12/2018    Past Surgical History:  Procedure Laterality Date  . gamma knife procedure for rt. trigeminal neuralgia Right   . left leg DVT Left   . STRABISMUS SURGERY     Right eye     Family History  Problem Relation Age of Onset  . Multiple sclerosis Sister   . Cervical cancer Mother   . Liver disease Father   . Hyperlipidemia Brother   . Pancreatitis Brother   . Hypertension Other   . Heart disease Other   . Uterine cancer Other     Social History:  reports that she has never smoked. She has never used smokeless tobacco. She reports that she does not drink alcohol or use drugs.  Allergies:  Allergies  Allergen Reactions  . Prednisone     Had gotten thrush  . Shellfish Allergy     Hives     Medications:  I have reviewed the patient's current medications.  Results for orders placed or performed during the hospital encounter of 10/11/18 (from the past 48 hour(s))  Comprehensive metabolic panel     Status: Abnormal   Collection Time: 10/11/18  3:04 PM  Result Value Ref Range   Sodium 141 135 - 145 mmol/L   Potassium 4.5 3.5 - 5.1 mmol/L   Chloride 109 98 - 111 mmol/L   CO2 23 22 - 32 mmol/L   Glucose, Bld 101 (H) 70 - 99 mg/dL   BUN 15 8 - 23 mg/dL   Creatinine, Ser 0.76 0.44 - 1.00 mg/dL   Calcium 9.1 8.9 - 10.3 mg/dL   Total Protein 7.3 6.5 - 8.1 g/dL   Albumin 3.1 (L) 3.5 - 5.0 g/dL   AST 27 15 - 41 U/L   ALT 23 0 - 44 U/L   Alkaline Phosphatase 72 38 - 126 U/L   Total Bilirubin 0.2 (L) 0.3 - 1.2 mg/dL   GFR calc non Af Amer >60 >60 mL/min   GFR calc Af Amer >60 >60 mL/min   Anion gap 9 5 - 15    Comment: Performed at [facility], [address], [place], [place] [address]  CBC with  Differential     Status: Abnormal   Collection Time: 10/11/18  3:04 PM  Result Value Ref Range   WBC 7.3 4.0 - 10.5 K/uL   RBC 3.68 (L) 3.87 - 5.11 MIL/uL   Hemoglobin 11.2 (L) 12.0 - 15.0 g/dL   HCT 37.1 36.0 - 46.0 %   MCV 100.8 (H) 80.0 - 100.0 fL   MCH 30.4 26.0 - 34.0 pg   MCHC 30.2 30.0 - 36.0 g/dL   RDW 12.8 11.5 - 15.5 %   Platelets 218 150 - 400 K/uL   nRBC 0.0 0.0 - 0.2 %   Neutrophils Relative % 63 %   Neutro Abs 4.5 1.7 - 7.7 K/uL   Lymphocytes Relative 19 %   Lymphs Abs 1.4 0.7 - 4.0 K/uL   Monocytes Relative 17 %   Monocytes Absolute 1.2 (H) 0.1 - 1.0 K/uL   Eosinophils Relative 0 %   Eosinophils Absolute 0.0 0.0 - 0.5 K/uL   Basophils Relative 0 %   Basophils Absolute 0.0 0.0 - 0.1 K/uL   Immature Granulocytes 1 %   Abs Immature Granulocytes 0.04 0.00 - 0.07 K/uL    Comment: Performed at [facility], [address] [place], [place] [address]  Culture, blood (Routine x 2)     Status: None (Preliminary result)   Collection Time: 10/11/18  3:24 PM  Result  Value Ref Range   Specimen Description BLOOD LEFT WRIST    Special Requests      BOTTLES DRAWN AEROBIC AND ANAEROBIC Blood Culture adequate volume   Culture      NO GROWTH < 24 HOURS Performed at [facility], [place] [address] [place], [place] [address]    Report Status PENDING   Culture, blood (Routine x 2)     Status: None (Preliminary result)   Collection Time: 10/11/18  3:39 PM  Result Value Ref Range   Specimen Description BLOOD LEFT HAND    Special Requests      BOTTLES DRAWN AEROBIC ONLY Blood Culture results may not be optimal due to an inadequate volume of blood received in culture bottles   Culture      NO GROWTH < 24 HOURS Performed at [facility] [address], [place], [place] [address]    Report Status PENDING   I-Stat CG4 Lactic Acid, ED     Status: None   Collection Time: 10/11/18  3:54 PM  Result Value Ref Range   Lactic Acid, Venous 1.68 0.5 - 1.9 mmol/L  Sedimentation rate     Status: Abnormal   Collection Time: 10/11/18 11:32 PM  Result Value Ref Range   Sed Rate 93 (H) 0 - 22 mm/hr    Comment: Performed at [facility] [address], [place], [place] [address]  C-reactive protein     Status: Abnormal   Collection Time: 10/11/18 11:32 PM  Result Value Ref Range   CRP 9.8 (H) <1.0 mg/dL    Comment: Performed at [facility] [address], [place],  [address]  CBC     Status: Abnormal   Collection Time: 10/12/18  4:00 AM  Result Value Ref Range   WBC 5.5 4.0 - 10.5 K/uL   RBC 3.24 (L) 3.87 - 5.11 MIL/uL   Hemoglobin 10.3 (L) 12.0 - 15.0 g/dL   HCT 31.1 (L) 36.0 - 46.0 %   MCV 96.0 80.0 - 100.0 fL   MCH 31.8 26.0 - 34.0 pg  MCHC 33.1 30.0 - 36.0 g/dL   RDW 12.9 11.5 - 15.5 %   Platelets 226 150 - 400 K/uL   nRBC 0.0 0.0 - 0.2 %    Comment: Performed at [facility], [place] [address], [place], [place] [address]  Basic metabolic panel     Status: Abnormal   Collection Time: 10/12/18  4:00 AM  Result Value Ref  Range   Sodium 141 135 - 145 mmol/L   Potassium 3.6 3.5 - 5.1 mmol/L   Chloride 107 98 - 111 mmol/L   CO2 25 22 - 32 mmol/L   Glucose, Bld 101 (H) 70 - 99 mg/dL   BUN 17 8 - 23 mg/dL   Creatinine, Ser 0.82 0.44 - 1.00 mg/dL   Calcium 8.8 (L) 8.9 - 10.3 mg/dL   GFR calc non Af Amer >60 >60 mL/min   GFR calc Af Amer >60 >60 mL/min   Anion gap 9 5 - 15    Comment: Performed at [facility], [place] [address], [place], [place] [address]  Vitamin B12     Status: Abnormal   Collection Time: 10/12/18  7:26 AM  Result Value Ref Range   Vitamin B-12 1,624 (H) 180 - 914 pg/mL    Comment: (NOTE) This assay is not validated for testing neonatal or myeloproliferative syndrome specimens for Vitamin B12 levels. Performed at [facility], [place] [address], [place], [place] [address]     Dg Tibia/fibula Right  Result Date: 10/11/2018 CLINICAL DATA:  Ankle wound. Struck foot on wooden door 2 weeks ago. Redness around wound spreading up leg. EXAM: RIGHT TIBIA AND FIBULA - 2 VIEW COMPARISON:  Concurrent ankle in foot radiographs. FINDINGS: Soft tissue defect about the lateral ankle, better assessed on concurrent ankle radiographs. Diffuse soft tissue edema of the lower leg. No fracture, dislocation, or bony destructive change. Degenerative change of the knee is partially included. IMPRESSION: 1. Soft tissue defect/wound about the lateral ankle, better assessed on concurrent ankle radiographs. Diffuse soft tissue edema of the lower leg. 2. No acute osseous abnormality. Electronically Signed   By: Keith Rake M.D.   On: 10/11/2018 23:06   Dg Ankle Complete Right  Result Date: 10/11/2018 CLINICAL DATA:  Ankle wound. Struck foot on wooden door 2 weeks ago. Redness around wound spreading up leg. EXAM: RIGHT ANKLE - COMPLETE 3+ VIEW COMPARISON:  None. FINDINGS: Soft tissue defect about the lateral ankle. No tracking soft tissue air. Diffuse soft tissue edema about the ankle. No bony destructive  change, periosteal reaction, fracture or dislocation. The ankle mortise is preserved. IMPRESSION: Soft tissue defect about the lateral ankle with diffuse soft tissue edema. No tracking soft tissue air. No radiographic findings of osteomyelitis or acute osseous abnormality. Electronically Signed   By: Keith Rake M.D.   On: 10/11/2018 23:07   Mr Ankle Right W Wo Contrast  Result Date: 10/12/2018 CLINICAL DATA:  Nonhealing lateral ankle wound following injury 2 weeks ago. Increasing lower extremity erythema. EXAM: MRI OF THE RIGHT ANKLE WITHOUT AND WITH CONTRAST TECHNIQUE: Multiplanar, multisequence MR imaging of the ankle was performed before and after the administration of intravenous contrast. CONTRAST:  8 cc Gadavist COMPARISON:  Radiographs 10/11/2018. FINDINGS: TENDONS Peroneal: Intact and normally positioned. No significant tenosynovitis or abnormal enhancement. Posteromedial: Intact and normally positioned. Anterior: Intact and normally positioned. Achilles: Intact. Plantar Fascia: Intact. LIGAMENTS Lateral: The anterior and posterior talofibular and calcaneofibular ligaments are intact. Medial: The deltoid and visualized portions of  the spring ligament appear intact. CARTILAGE AND BONES Ankle Joint: No significant ankle joint effusion or abnormal synovial enhancement. Mild tibiotalar degenerative changes. Subtalar Joints/Sinus Tarsi: Unremarkable. Bones: There is a large skin defect lateral to the lateral malleolus with an underlying large area of increased T2 signal and absent enhancement in the subjacent soft tissues. The area of absent enhancement measures 2.8 x 2.4 x 1.1 cm and abuts the lateral malleolus. There is an underlying small subperiosteal abscess measuring up to 17 mm on coronal image 19/5. This is associated with cortical destruction of the distal fibula laterally, underlying marrow edema and enhancement. No other suspicious marrow lesions. Other: In addition to the focal ulcer lateral  to the lateral malleolus, there is generalized subcutaneous edema in the lower leg, extending into the dorsum of the foot. No other focal fluid collections are identified. IMPRESSION: IMPRESSION 1. Large soft tissue ulcer lateral to the lateral malleolus with underlying soft tissue necrosis and a small subperiosteal abscess of the distal fibula. 2. Mild osteomyelitis of the lateral malleolus. No evidence of septic joint. 3. Generalized soft tissue edema consistent with cellulitis. No focal fluid collections. 4. Intact ankle tendons and ligaments. 5. Preliminary results of this case were discussed between Dr. Radene Knee and Dr. Tamala Julian at 0416 hours on 10/12/2018. Electronically Signed   By: Richardean Sale M.D.   On: 10/12/2018 07:49   Dg Foot Complete Right  Result Date: 10/11/2018 CLINICAL DATA:  Ankle wound. Struck foot on wooden door 2 weeks ago. Redness around wound spreading up leg. EXAM: RIGHT FOOT COMPLETE - 3+ VIEW COMPARISON:  Concurrent ankle radiographs. FINDINGS: Soft tissue defect about the lateral ankle better assessed on concurrent ankle exam. Diffuse soft tissue edema about the foot. No fracture, dislocation, or bony destructive change. Scattered osteoarthritis in the midfoot and about the digits. IMPRESSION: Soft tissue defect about the lateral ankle better assessed on concurrent ankle radiographs. Diffuse soft tissue edema of the foot. No acute osseous abnormality. Electronically Signed   By: Keith Rake M.D.   On: 10/11/2018 23:08    Review of Systems  Unable to perform ROS: Mental acuity   Blood pressure 131/62, pulse 88, temperature 98.6 F (37 C), temperature source Oral, resp. rate 16, SpO2 99 %. Physical Exam  Constitutional: She appears well-developed and well-nourished. No distress.  HENT:  Head: Normocephalic and atraumatic.  Eyes: Conjunctivae are normal. Right eye exhibits no discharge. Left eye exhibits no discharge. No scleral icterus.  Neck: Normal range of motion.   Cardiovascular: Normal rate and regular rhythm.  Respiratory: Effort normal. No respiratory distress.  Musculoskeletal:     Comments: RLE No traumatic wounds, ecchymosis, or rash  3cm annular ulceration over lateral malleolus, necrotic base, foul odor, surrounding erythema with extension superiorly  No knee or ankle effusion  Knee stable to varus/ valgus and anterior/posterior stress  Sens DPN, SPN intact, TN paresthetic  Motor EHL, ext, flex, evers 0/5  DP 2+, PT 1+, 2+ NP edema  Neurological: She is alert.  Skin: Skin is warm and dry. She is not diaphoretic.  Psychiatric: She has a normal mood and affect. Her behavior is normal.    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 Dr. [name] to evaluate. Baldwin for diet today.    Lisette Abu, PA-C Orthopedic Surgery [phone] 10/12/2018, 1:20 PM

## 2018-10-12 NOTE — ED Provider Notes (Signed)
MRI results discussed with radiologist, does show evidence of osteomyelitis.  She is already on appropriate antibiotics.   Meagan Fuel, MD [birth year]75 [phone]

## 2018-10-12 NOTE — ED Notes (Signed)
Patient transported to MRI 

## 2018-10-12 NOTE — Consult Note (Signed)
[facility] Nurse wound consult note Patient receiving care in [facility] [id number].  After reviewing the results of the MRI of the affected ankle, the following results were found: "IMPRESSION 1. Large soft tissue ulcer lateral to the lateral malleolus with underlying soft tissue necrosis and a small subperiosteal abscess of the distal fibula. 2. Mild osteomyelitis of the lateral malleolus. No evidence of septic joint. 3. Generalized soft tissue edema consistent with cellulitis. No focal fluid collections. 4. Intact ankle tendons and ligaments. 5. Preliminary results of this case were discussed between Dr. Radene Knee and Dr. Tamala Julian at 0416 hours on 10/12/2018." Timed and dated 10/12/2018 at 07:49  Reason for Consult: Right ankle wound Wound type: Infectious Abscesses and osteomyelitis care exceeds the scope of a [place].  Please consult the surgical service of your choice for further assistance. Val Riles, RN, MSN, CWOCN, CNS-BC, pager [phone]

## 2018-10-12 NOTE — Progress Notes (Signed)
Pharmacy Antibiotic Note  [name] is a 73 y.o. female admitted on 10/11/2018 with cellulitis/osteomyelitis.  Pharmacy has been consulted for Vancomycin dosing.  Vancomycin 1250 mg IV given in ED at [facility]: Vancomycin 750 mg IV q24h    Temp (24hrs), Avg:98.2 F (36.8 C), Min:98.1 F (36.7 C), Max:98.2 F (36.8 C)  Recent Labs  Lab 10/11/18 1504 10/11/18 1554 10/12/18 0400  WBC 7.3  --  5.5  CREATININE 0.76  --  0.82  LATICACIDVEN  --  1.68  --     CrCl cannot be calculated (Unknown ideal weight.).    Allergies  Allergen Reactions  . Prednisone     Had gotten thrush  . Shellfish Allergy     Hives     Caryl Pina 10/12/2018 5:07 AM

## 2018-10-12 NOTE — Consult Note (Signed)
[facility] Nurse wound consult note Per instructions of [facility] Coordinator, I have text paged my recommendation of a surgical consult to Dr. Steward Ros at [phone].  On a separate note, the primary RN informed me that the patient had a wound on her buttocks. 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  I have ordered 40% Desitin and turning to address these issues. Thank you for the consult.  Discussed plan of care with the patient and bedside nurse.  [facility] nurse will not follow at this time.  Please re-consult the [facility] team if needed.  Val Riles, RN, MSN, CWOCN, CNS-BC, pager [phone]

## 2018-10-12 NOTE — Progress Notes (Signed)
Have seen pt and examined, she has a R lateral ankle wound w/ eschar,  surrounding edema and erythema and foul odor, but no drainage.  MRI showed osteomyelitis and small abscess. Pt has multiple sclerosis and is alert but has a hard time expressing herself.  She is chronically debilitated , prob bedbound, and cared for by family at home.  Have consulted orthopedics. Cont IV abx vanc/ Rocephin.  Will hold ARB while getting vanc and will start IVF's.  Cont other home meds.   Kelly Splinter MD [facility] pgr [phone] 10/12/2018, 12:21 PM

## 2018-10-12 NOTE — H&P (Addendum)
History and Physical    [name] FVC:[id number] DOB: 05-21-1946 DOA: 10/11/2018  Referring MD/NP/PA: Hulan Saas, MD(resident) PCP: [name], MD  Patient coming from:  Home   Chief Complaint: Ankle wound  I have personally briefly reviewed patient's old medical records in [place]   HPI: [name] is a 73 y.o. female with medical history significant of severe MS, HTN, trigeminal neuralgia, gait disturbance, dementia, DVT; who presents with complaints of nonhealing right ankle wound.  Which of history comes from the patient's son is her primary caregiver.  At baseline patient is fairly debilitated needs assistance with transfers.  2 weeks ago she hit her right foot on a wooden door frame while trying to transfer and sustained a cut.  Son notes that they dressed the wound with a bandage.  They initially thought that the wound was healing up until last week when they noticed foul smelling drainage coming from the wound.  Since that time he has noted that the wound was not healing and it has turned black.  Trying to get her into her primary care provider, but they were all booked up.  The son noted that she was intermittently more confused than normal and questioned if it was related to the wound.  Denies any reports of fever, cough, shortness of breath, chest pain, nausea, vomiting, diarrhea, or dysuria symptoms.  Patient denies any complaints currently at this time.     ED Course: Upon admission into the emergency department patient was noted to be afebrile with vital signs relatively within normal limits.  Labs revealed WBC 7.3, hemoglobin 11.2 with MCV of 100.8, lactic acid 1.68, CRP 9.8, and ESR 93.  X-ray images showed diffuse soft tissue swelling no signs of osseous damage.  Patient was treated with empiric antibiotics of vancomycin and Rocephin.  MRI of the right ankle was ordered for concern for osteomyelitis.  Review of Systems  Unable to perform ROS: Dementia    Constitutional: Negative for fever.  HENT: Negative for ear discharge.        Positive for jaw pain  Eyes: Negative for double vision and photophobia.  Respiratory: Negative for cough and shortness of breath.   Cardiovascular: Negative for chest pain and orthopnea.  Gastrointestinal: Negative for nausea and vomiting.  Genitourinary: Negative for dysuria and hematuria.  Musculoskeletal: Positive for myalgias. Negative for falls.  Skin: Negative for itching.  Neurological: Negative for loss of consciousness.  Endo/Heme/Allergies: Negative for polydipsia.  Psychiatric/Behavioral: Positive for memory loss. Negative for hallucinations.     Past Medical History:  Diagnosis Date  . Abnormality of gait   . Depression   . DVT (deep venous thrombosis) (HCC)    Left leg  . Dyslipidemia   . Fibroids   . Hypertension   . Memory difficulties 12/10/2014  . Multiple sclerosis (HCC)    Right sided weakness and gait disorder, bladder and bowel problems   . Neurogenic bladder   . 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 Overdose of muscle relaxant    Unintentional baclofen overdose   . Trigeminal neuralgia   . Trigeminal neuralgia    Right    Past Surgical History:  Procedure Laterality Date  . gamma knife procedure for rt. trigeminal neuralgia Right   . left leg DVT Left   . STRABISMUS SURGERY     Right eye      reports that she has never smoked. She has never used smokeless tobacco.  She reports that she does not drink alcohol or use drugs.  Allergies  Allergen Reactions  . Prednisone     Had gotten thrush  . Shellfish Allergy     Hives     Family History  Problem Relation Age of Onset  . Multiple sclerosis Sister   . Cervical cancer Mother   . Liver disease Father   . Hyperlipidemia Brother   . Pancreatitis Brother   . Hypertension Other   . Heart disease Other   . Uterine cancer Other     Prior to Admission medications   Medication Sig Start Date End Date Taking?  Authorizing Provider  acetaminophen (TYLENOL) 500 MG tablet Take 500 mg by mouth as needed.    [provider]  amLODipine (NORVASC) 10 MG tablet Take 1 tablet (10 mg total) by mouth daily. 11/03/11   Deneise Lever, MD  APPLE CIDER VINEGAR PO Take 1 capsule by mouth daily. 455m    [provider]  atorvastatin (LIPITOR) 40 MG tablet Take 40 mg by mouth daily.      [provider]  baclofen (LIORESAL) 10 MG tablet ONE TABLET TWICE DURING THE DAY AND 2 AT NIGHT 07/18/18   WKathrynn Ducking MD  BLACK CURRANT SEED OIL PO Take 1 capsule by mouth daily.    [provider]  buPROPion (WELLBUTRIN XL) 300 MG 24 hr tablet Take 300 mg by mouth daily.    NDeneise Lever MD  carbamazepine (TEGRETOL) 200 MG tablet TAKE 1 TABLET BY MOUTH 3 TIMES A DAY AND 1/2 TABLET AT BEDTIME AT BEDTIME 09/24/18   WKathrynn Ducking MD  Cholecalciferol ([employer] VIT D 5000 HIGH-POTENCY PO) Take 5,000 Int'l Units/L by mouth daily.    [provider]  clotrimazole (MYCELEX) 10 MG troche Take 1 tablet (10 mg total) by mouth 5 (five) times daily. 08/31/18   WKathrynn Ducking MD  dexamethasone (DECADRON) 2 MG tablet Begin taking 6 tablets daily, taper by one tablet daily until off the medication. 08/31/18   WKathrynn Ducking MD  donepezil (ARICEPT) 5 MG tablet TAKE 1 TABLET (5 MG TOTAL) BY MOUTH DAILY. 03/12/18   WKathrynn Ducking MD  FIBER SELECT GUMMIES PO Take 5 g by mouth 2 (two) times daily.     [provider]  fish oil-omega-3 fatty acids 1000 MG capsule Take 2 g by mouth 3 (three) times daily.    NDeneise Lever MD  Interferon Beta-1a (AVONEX PEN) 30 MCG/0.5ML AJKT Inject 30 mcg into the muscle every 7 (seven) days. 10/08/18   MWard Givens NP  labetalol (NORMODYNE) 100 MG tablet Take 100 mg by mouth 2 (two) times daily.    NDeneise Lever MD  levETIRAcetam (KEPPRA) 500 MG tablet Take 1 tablet (500 mg total) by mouth 2 (two) times daily. 09/04/18   WKathrynn Ducking MD  losartan-hydrochlorothiazide ([facility] 100-12.5 MG per tablet  12/09/14   [provider]  Multiple Vitamins-Minerals (MULTIVITAMINS THER. W/MINERALS) TABS Take 1 tablet by mouth daily.      [provider]  petrolatum-hydrophilic-aloe vera (ALOE VESTA) ointment Apply topically 2 (two) times daily as needed for wound care. 04/06/17   OLysbeth Penner FNP  Potassium Chloride (KLOR-CON PO) Take 20 mEq by mouth.    [provider]  pregabalin (LYRICA) 150 MG capsule Take 1 capsule (150 mg total) by mouth 2 (two) times daily. 08/29/18   WKathrynn Ducking MD  sertraline (ZOLOFT) 50 MG tablet  Take 50 mg by mouth daily. One and one half tablet once a day in morning.    [provider]  Skin Protectants, Misc. (WHITE PETROLATUM-ZINC OXIDE) cream Apply topically as needed. 04/06/17   Lysbeth Penner, FNP  tolterodine (DETROL LA) 2 MG 24 hr capsule TAKE 1 CAPSULE BY MOUTH EVERYDAY AT BEDTIME 09/18/18   Kathrynn Ducking, MD  traMADol (ULTRAM) 50 MG tablet TAKE 1 TABLET BY MOUTH EVERY 4-6 AS NEEDED MUST LAST 28 DAYS 12/26/16   Kathrynn Ducking, MD    Physical Exam:  Constitutional: Elderly female currently inNAD, calm, comfortable Vitals:   10/11/18 1923 10/11/18 2330 10/12/18 0000 10/12/18 0510  BP: 121/76 117/66 (!) 135/51 (!) 159/63  Pulse: 76 83 85   Resp: 18  18   Temp: 98.2 F (36.8 C)     TempSrc: Oral     SpO2: 96% 100% 100%    Eyes: PERRL, lids and conjunctivae normal ENMT: Mucous membranes are moist. Posterior pharynx clear of any exudate or lesions.  Neck: normal, supple, no masses, no thyromegaly Respiratory: clear to auscultation bilaterally, no wheezing, no crackles. Normal respiratory effort. No accessory muscle use.  Cardiovascular: Regular rate and rhythm, no murmurs / rubs / gallops. No extremity edema. 2+ pedal pulses. No carotid bruits.  Abdomen: no tenderness, no masses palpated. No hepatosplenomegaly. Bowel sounds positive.    Musculoskeletal: no clubbing / cyanosis.  Contractures present Skin: Ulceration to the lateral aspect of the right ankle with black eschar appreciated and foul smell Neurologic: Patient is wheelchair-bound patient has 4+/ 5 in the upper extremities, 2/5 in the right leg, and 3/5 in the left leg. Psychiatric: Poor memory. Alert and oriented x 2. Normal mood.     Labs on Admission: I have personally reviewed following labs and imaging studies  CBC: Recent Labs  Lab 10/11/18 1504 10/12/18 0400  WBC 7.3 5.5  NEUTROABS 4.5  --   HGB 11.2* 10.3*  HCT 37.1 31.1*  MCV 100.8* 96.0  PLT 218 494   Basic Metabolic Panel: Recent Labs  Lab 10/11/18 1504 10/12/18 0400  NA 141 141  K 4.5 3.6  CL 109 107  CO2 23 25  GLUCOSE 101* 101*  BUN 15 17  CREATININE 0.76 0.82  CALCIUM 9.1 8.8*   GFR: CrCl cannot be calculated (Unknown ideal weight.). Liver Function Tests: Recent Labs  Lab 10/11/18 1504  AST 27  ALT 23  ALKPHOS 72  BILITOT 0.2*  PROT 7.3  ALBUMIN 3.1*   No results for input(s): LIPASE, AMYLASE in the last 168 hours. No results for input(s): AMMONIA in the last 168 hours. Coagulation Profile: No results for input(s): INR, PROTIME in the last 168 hours. Cardiac Enzymes: No results for input(s): CKTOTAL, CKMB, CKMBINDEX, TROPONINI in the last 168 hours. BNP (last 3 results) No results for input(s): PROBNP in the last 8760 hours. HbA1C: No results for input(s): HGBA1C in the last 72 hours. CBG: No results for input(s): GLUCAP in the last 168 hours. Lipid Profile: No results for input(s): CHOL, HDL, LDLCALC, TRIG, CHOLHDL, LDLDIRECT in the last 72 hours. Thyroid Function Tests: No results for input(s): TSH, T4TOTAL, FREET4, T3FREE, THYROIDAB in the last 72 hours. Anemia Panel: No results for input(s): VITAMINB12, FOLATE, FERRITIN, TIBC, IRON, RETICCTPCT in the last 72 hours. Urine analysis:    Component Value Date/Time   COLORURINE ORANGE (A) 09/28/2011 1846    APPEARANCEUR Cloudy (A) 02/04/2014 1313   LABSPEC 1.036 (H) 09/28/2011 1846   PHURINE  6.5 09/28/2011 1846   GLUCOSEU Negative 02/04/2014 1313   HGBUR MODERATE (A) 09/28/2011 1846   BILIRUBINUR Negative 02/04/2014 1313   KETONESUR 15 (A) 09/28/2011 1846   PROTEINUR Negative 02/04/2014 1313   PROTEINUR 100 (A) 09/28/2011 1846   UROBILINOGEN 2.0 (H) 09/28/2011 1846   NITRITE Negative 02/04/2014 1313   NITRITE POSITIVE (A) 09/28/2011 1846   LEUKOCYTESUR Negative 02/04/2014 1313   Sepsis Labs: No results found for this or any previous visit (from the past 240 hour(s)).   Radiological Exams on Admission: Dg Tibia/fibula Right  Result Date: 10/11/2018 CLINICAL DATA:  Ankle wound. Struck foot on wooden door 2 weeks ago. Redness around wound spreading up leg. EXAM: RIGHT TIBIA AND FIBULA - 2 VIEW COMPARISON:  Concurrent ankle in foot radiographs. FINDINGS: Soft tissue defect about the lateral ankle, better assessed on concurrent ankle radiographs. Diffuse soft tissue edema of the lower leg. No fracture, dislocation, or bony destructive change. Degenerative change of the knee is partially included. IMPRESSION: 1. Soft tissue defect/wound about the lateral ankle, better assessed on concurrent ankle radiographs. Diffuse soft tissue edema of the lower leg. 2. No acute osseous abnormality. Electronically Signed   By: Keith Rake M.D.   On: 10/11/2018 23:06   Dg Ankle Complete Right  Result Date: 10/11/2018 CLINICAL DATA:  Ankle wound. Struck foot on wooden door 2 weeks ago. Redness around wound spreading up leg. EXAM: RIGHT ANKLE - COMPLETE 3+ VIEW COMPARISON:  None. FINDINGS: Soft tissue defect about the lateral ankle. No tracking soft tissue air. Diffuse soft tissue edema about the ankle. No bony destructive change, periosteal reaction, fracture or dislocation. The ankle mortise is preserved. IMPRESSION: Soft tissue defect about the lateral ankle with diffuse soft tissue edema. No tracking soft tissue  air. No radiographic findings of osteomyelitis or acute osseous abnormality. Electronically Signed   By: Keith Rake M.D.   On: 10/11/2018 23:07   Dg Foot Complete Right  Result Date: 10/11/2018 CLINICAL DATA:  Ankle wound. Struck foot on wooden door 2 weeks ago. Redness around wound spreading up leg. EXAM: RIGHT FOOT COMPLETE - 3+ VIEW COMPARISON:  Concurrent ankle radiographs. FINDINGS: Soft tissue defect about the lateral ankle better assessed on concurrent ankle exam. Diffuse soft tissue edema about the foot. No fracture, dislocation, or bony destructive change. Scattered osteoarthritis in the midfoot and about the digits. IMPRESSION: Soft tissue defect about the lateral ankle better assessed on concurrent ankle radiographs. Diffuse soft tissue edema of the foot. No acute osseous abnormality. Electronically Signed   By: Keith Rake M.D.   On: 10/11/2018 23:08    Assessment/Plan Right ankle wound infection/suspected osteomyelitis: Acute.  Patient presents with right ankle wound with soft tissue swelling.  X-rays noted soft tissue swelling.  Labs reveal significantly elevated ESR of 93 with CRP 9.8 given concern for osteomyelitis.  She was started on empiric antibiotics of vancomycin and ceftriaxone, but no blood cultures were obtained prior to antibiotics. - Admit to MedSurg. - Follow-up MRI of the right ankle - Continue empiric antibiotics of Rocephin and vancomycin - Will need to consult orthopedics in a.m.  Microcytic anemia: Acute.  Hemoglobin 11.2 with MCV elevated at 100.8.  Son gives no reports of bleeding - Recheck CBC, vitamin B12, and folate  Multiple sclerosis - Continue baclofen and other home meds  Trigeminal neuralgia - Continue carbamazepine and Lyrica  Essential hypertension - Continue hydralazine and labetalol  Pressure ulcers - Low-air-loss mattress - Wound care to eval  Dementia - Continue  Aricept  DVT prophylaxis: lovenox  Code Status: Full  Family  Communication: Discussed plan of care with the patient and family present at bedside Disposition Plan: Discharge home once medically stable Consults called: none Admission status: inpatient  Norval Morton MD [facility] Pager [phone]   If 7PM-7AM, please contact night-coverage [email or web address] Password [email or web address]  10/12/2018, 5:16 AM

## 2018-10-12 NOTE — Consult Note (Signed)
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  HPI: [name] is a 73 y.o. female who presents with dementia and paralysis of her upper and lower extremities.  Patient has no active motor function in either lower extremity.  Past Medical History:  Diagnosis Date  . Abnormality of gait   . Depression   . DVT (deep venous thrombosis) (HCC)    Left leg  . Dyslipidemia   . Fibroids   . Hypertension   . Memory difficulties 12/10/2014  . Multiple sclerosis (HCC)    Right sided weakness and gait disorder, bladder and bowel problems   . Neurogenic bladder   . 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 Overdose of muscle relaxant    Unintentional baclofen overdose   . Trigeminal neuralgia   . Trigeminal neuralgia    Right  . Wound infection 10/12/2018   Past Surgical History:  Procedure Laterality Date  . gamma knife procedure for rt. trigeminal neuralgia Right   . left leg DVT Left   . STRABISMUS SURGERY     Right eye    Social History   Socioeconomic History  . Marital status: Divorced    Spouse name: Not on file  . Number of children: 2  . Years of education: 49  . Highest education level: Not on file  Occupational History  . Occupation: disabled Education officer, museum  Social Needs  . Financial resource strain: Not on file  . Food insecurity:    Worry: Not on file    Inability: Not on file  . Transportation needs:    Medical: Not on file    Non-medical: Not on file  Tobacco Use  . Smoking status: Never Smoker  . Smokeless tobacco: Never Used  Substance and Sexual Activity  . Alcohol use: No  . Drug use: No  . Sexual activity: Not Currently    Birth control/protection: Post-menopausal  Lifestyle  . Physical activity:    Days per week: Not on file    Minutes per session: Not on file  . Stress: Not on file  Relationships  . Social connections:    Talks  on phone: Not on file    Gets together: Not on file    Attends religious service: Not on file    Active member of club or organization: Not on file    Attends meetings of clubs or organizations: Not on file    Relationship status: Not on file  Other Topics Concern  . Not on file  Social History Narrative   Divorced, has a son and daughter. [name] is very involved in her care. [phone] and [phone]   Living with her son, [name] as FT caregiver.  02-09-15 ssy      Patient drinks about 2 sodas daily.   Patient is right handed.    Family History  Problem Relation Age of Onset  . Multiple sclerosis Sister   . Cervical cancer Mother   . Liver disease Father   . Hyperlipidemia Brother   . Pancreatitis Brother   . Hypertension Other   . Heart disease Other   . Uterine cancer Other    - negative except otherwise stated in the family history section Allergies  Allergen Reactions  . Prednisone     Had gotten thrush  . Shellfish Allergy     Hives    Prior to Admission medications  Medication Sig Start Date End Date Taking? Authorizing Provider  acetaminophen (TYLENOL) 500 MG tablet Take 500 mg by mouth every 6 (six) hours as needed for mild pain.    Yes [provider]  amLODipine (NORVASC) 10 MG tablet Take 1 tablet (10 mg total) by mouth daily. 11/03/11  Yes Deneise Lever, MD  APPLE CIDER VINEGAR PO Take 1 capsule by mouth daily. 450mg    Yes [provider]  atorvastatin (LIPITOR) 40 MG tablet Take 40 mg by mouth daily.     Yes [provider]  B Complex-C (B-COMPLEX WITH VITAMIN C) tablet Take 1 tablet by mouth daily.   Yes [provider]  baclofen (LIORESAL) 10 MG tablet ONE TABLET TWICE DURING THE DAY AND 2 AT NIGHT Patient taking differently: Take 10 mg by mouth 3 (three) times daily.  07/18/18  Yes Kathrynn Ducking, MD  buPROPion (WELLBUTRIN XL) 300 MG 24 hr tablet Take 300 mg by mouth daily.   Yes Deneise Lever, MD  carbamazepine  (TEGRETOL) 200 MG tablet TAKE 1 TABLET BY MOUTH 3 TIMES A DAY AND 1/2 TABLET AT BEDTIME AT BEDTIME Patient taking differently: Take 200-300 mg by mouth 3 (three) times daily. 300 mg in the morning and 200 mg in the afternoon and bedtime. 09/24/18  Yes Kathrynn Ducking, MD  Cholecalciferol ([employer] VIT D 5000 HIGH-POTENCY PO) Take 5,000 Int'l Units/L by mouth daily.   Yes [provider]  donepezil (ARICEPT) 5 MG tablet TAKE 1 TABLET (5 MG TOTAL) BY MOUTH DAILY. 03/12/18  Yes Kathrynn Ducking, MD  FIBER SELECT GUMMIES PO Take 5 g by mouth 2 (two) times daily.    Yes [provider]  fish oil-omega-3 fatty acids 1000 MG capsule Take 2 g by mouth 3 (three) times daily.   Yes Deneise Lever, MD  hydrALAZINE (APRESOLINE) 10 MG tablet Take 10 mg by mouth 2 (two) times daily.   Yes [provider]  Interferon Beta-1a (AVONEX PEN) 30 MCG/0.5ML AJKT Inject 30 mcg into the muscle every 7 (seven) days. 10/08/18  Yes Ward Givens, NP  labetalol (NORMODYNE) 100 MG tablet Take 100 mg by mouth 2 (two) times daily.   Yes Deneise Lever, MD  losartan-hydrochlorothiazide (HYZAAR) 100-12.5 MG per tablet Take 1 tablet by mouth daily.  12/09/14  Yes [provider]  Multiple Vitamins-Minerals (MULTIVITAMINS THER. W/MINERALS) TABS Take 1 tablet by mouth daily.     Yes [provider]  petrolatum-hydrophilic-aloe vera (ALOE VESTA) ointment Apply topically 2 (two) times daily as needed for wound care. 04/06/17  Yes Lysbeth Penner, FNP  Potassium Chloride (KLOR-CON PO) Take 20 mEq by mouth 3 (three) times daily.    Yes [provider]  pregabalin (LYRICA) 150 MG capsule Take 1 capsule (150 mg total) by mouth 2 (two) times daily. 08/29/18  Yes Kathrynn Ducking, MD  sertraline (ZOLOFT) 50 MG tablet Take 75 mg by mouth daily. One and one half tablet once a day in morning.    Yes [provider]  Skin Protectants, Misc. (WHITE PETROLATUM-ZINC OXIDE) cream Apply  topically as needed. Patient taking differently: Apply 1 application topically daily as needed for dry skin or wound care.  04/06/17  Yes Lysbeth Penner, FNP  tolterodine (DETROL LA) 2 MG 24 hr capsule TAKE 1 CAPSULE BY MOUTH EVERYDAY AT BEDTIME Patient taking differently: Take 2 mg by mouth at bedtime.  09/18/18  Yes Kathrynn Ducking, MD  traMADol Veatrice Bourbon) 50  MG tablet TAKE 1 TABLET BY MOUTH EVERY 4-6 AS NEEDED MUST LAST 28 DAYS Patient taking differently: Take 50 mg by mouth every 6 (six) hours as needed for moderate pain.  12/26/16  Yes Kathrynn Ducking, MD  clotrimazole (MYCELEX) 10 MG troche Take 1 tablet (10 mg total) by mouth 5 (five) times daily. 08/31/18   Kathrynn Ducking, MD  dexamethasone (DECADRON) 2 MG tablet Begin taking 6 tablets daily, taper by one tablet daily until off the medication. 08/31/18   Kathrynn Ducking, MD  levETIRAcetam (KEPPRA) 500 MG tablet Take 1 tablet (500 mg total) by mouth 2 (two) times daily. Patient not taking: Reported on 10/12/2018 09/04/18   Kathrynn Ducking, MD   Dg Tibia/fibula Right  Result Date: 10/11/2018 CLINICAL DATA:  Ankle wound. Struck foot on wooden door 2 weeks ago. Redness around wound spreading up leg. EXAM: RIGHT TIBIA AND FIBULA - 2 VIEW COMPARISON:  Concurrent ankle in foot radiographs. FINDINGS: Soft tissue defect about the lateral ankle, better assessed on concurrent ankle radiographs. Diffuse soft tissue edema of the lower leg. No fracture, dislocation, or bony destructive change. Degenerative change of the knee is partially included. IMPRESSION: 1. Soft tissue defect/wound about the lateral ankle, better assessed on concurrent ankle radiographs. Diffuse soft tissue edema of the lower leg. 2. No acute osseous abnormality. Electronically Signed   By: Keith Rake M.D.   On: 10/11/2018 23:06   Dg Ankle Complete Right  Result Date: 10/11/2018 CLINICAL DATA:  Ankle wound. Struck foot on wooden door 2 weeks ago. Redness around wound  spreading up leg. EXAM: RIGHT ANKLE - COMPLETE 3+ VIEW COMPARISON:  None. FINDINGS: Soft tissue defect about the lateral ankle. No tracking soft tissue air. Diffuse soft tissue edema about the ankle. No bony destructive change, periosteal reaction, fracture or dislocation. The ankle mortise is preserved. IMPRESSION: Soft tissue defect about the lateral ankle with diffuse soft tissue edema. No tracking soft tissue air. No radiographic findings of osteomyelitis or acute osseous abnormality. Electronically Signed   By: Keith Rake M.D.   On: 10/11/2018 23:07   Mr Ankle Right W Wo Contrast  Result Date: 10/12/2018 CLINICAL DATA:  Nonhealing lateral ankle wound following injury 2 weeks ago. Increasing lower extremity erythema. EXAM: MRI OF THE RIGHT ANKLE WITHOUT AND WITH CONTRAST TECHNIQUE: Multiplanar, multisequence MR imaging of the ankle was performed before and after the administration of intravenous contrast. CONTRAST:  8 cc Gadavist COMPARISON:  Radiographs 10/11/2018. FINDINGS: TENDONS Peroneal: Intact and normally positioned. No significant tenosynovitis or abnormal enhancement. Posteromedial: Intact and normally positioned. Anterior: Intact and normally positioned. Achilles: Intact. Plantar Fascia: Intact. LIGAMENTS Lateral: The anterior and posterior talofibular and calcaneofibular ligaments are intact. Medial: The deltoid and visualized portions of the spring ligament appear intact. CARTILAGE AND BONES Ankle Joint: No significant ankle joint effusion or abnormal synovial enhancement. Mild tibiotalar degenerative changes. Subtalar Joints/Sinus Tarsi: Unremarkable. Bones: There is a large skin defect lateral to the lateral malleolus with an underlying large area of increased T2 signal and absent enhancement in the subjacent soft tissues. The area of absent enhancement measures 2.8 x 2.4 x 1.1 cm and abuts the lateral malleolus. There is an underlying small subperiosteal abscess measuring up to 17 mm on  coronal image 19/5. This is associated with cortical destruction of the distal fibula laterally, underlying marrow edema and enhancement. No other suspicious marrow lesions. Other: In addition to the focal ulcer lateral to the lateral malleolus, there is generalized subcutaneous edema  in the lower leg, extending into the dorsum of the foot. No other focal fluid collections are identified. IMPRESSION: IMPRESSION 1. Large soft tissue ulcer lateral to the lateral malleolus with underlying soft tissue necrosis and a small subperiosteal abscess of the distal fibula. 2. Mild osteomyelitis of the lateral malleolus. No evidence of septic joint. 3. Generalized soft tissue edema consistent with cellulitis. No focal fluid collections. 4. Intact ankle tendons and ligaments. 5. Preliminary results of this case were discussed between Dr. Radene Knee and Dr. Tamala Julian at 0416 hours on 10/12/2018. Electronically Signed   By: Richardean Sale M.D.   On: 10/12/2018 07:49   Dg Foot Complete Right  Result Date: 10/11/2018 CLINICAL DATA:  Ankle wound. Struck foot on wooden door 2 weeks ago. Redness around wound spreading up leg. EXAM: RIGHT FOOT COMPLETE - 3+ VIEW COMPARISON:  Concurrent ankle radiographs. FINDINGS: Soft tissue defect about the lateral ankle better assessed on concurrent ankle exam. Diffuse soft tissue edema about the foot. No fracture, dislocation, or bony destructive change. Scattered osteoarthritis in the midfoot and about the digits. IMPRESSION: Soft tissue defect about the lateral ankle better assessed on concurrent ankle radiographs. Diffuse soft tissue edema of the foot. No acute osseous abnormality. Electronically Signed   By: Keith Rake M.D.   On: 10/11/2018 23:08   - pertinent xrays, CT, MRI studies were reviewed and independently interpreted  Positive ROS: All other systems have been reviewed and were otherwise negative with the exception of those mentioned in the HPI and as above.  Physical  Exam: General: Alert, no acute distress Psychiatric: Patient is  Not competent for consent with normal mood and affect Lymphatic: No axillary or cervical lymphadenopathy Cardiovascular: No pedal edema Respiratory: No cyanosis, no use of accessory musculature GI: No organomegaly, abdomen is soft and non-tender    Images:  @ENCIMAGES @  Labs:  Lab Results  Component Value Date   ESRSEDRATE 93 (H) 10/11/2018   CRP 9.8 (H) 10/11/2018   REPTSTATUS PENDING 10/11/2018   CULT  10/11/2018    NO GROWTH < 24 HOURS Performed at [facility] [address] [place], [place] [address]    LABORGA ESCHERICHIA COLI 08/02/2009    Lab Results  Component Value Date   ALBUMIN 3.1 (L) 10/11/2018   ALBUMIN 4.2 07/09/2018   ALBUMIN 4.2 12/26/2017    Neurologic: Patient does not have protective sensation bilateral lower extremities.   MUSCULOSKELETAL:   Skin: Examination patient has a large necrotic ulcer over the lateral malleolus right ankle this is approximately 4 cm in diameter.  Patient has a strong dorsalis pedis pulse bilaterally.  Patient does not have active motor strength of either lower extremity.  Patient has contractures of both lower extremities.  Review of the radiographs and MRI scan shows the ulceration of the lateral malleolus of the right ankle with underlying osteomyelitis.  Assessment: Assessment: Dementia with paralysis involving both lower extremities with osteomyelitis and ulceration lateral malleolus right ankle.  Plan: Plan: I will need to be in touch with the patient's power of attorney for recommended transtibial amputation on the right.  Patient does not have sufficient capabilities to make this decision on her own.  Thank you for the consult and the opportunity to see [name], [place] [phone] 5:09 PM

## 2018-10-13 ENCOUNTER — Inpatient Hospital Stay (HOSPITAL_COMMUNITY): Payer: Medicare HMO

## 2018-10-13 DIAGNOSIS — N319 Neuromuscular dysfunction of bladder, unspecified: Secondary | ICD-10-CM

## 2018-10-13 DIAGNOSIS — F09 Unspecified mental disorder due to known physiological condition: Secondary | ICD-10-CM

## 2018-10-13 DIAGNOSIS — Z7401 Bed confinement status: Secondary | ICD-10-CM

## 2018-10-13 DIAGNOSIS — L03115 Cellulitis of right lower limb: Secondary | ICD-10-CM

## 2018-10-13 MED ORDER — VANCOMYCIN HCL IN DEXTROSE 1-5 GM/200ML-% IV SOLN
1000.0000 mg | INTRAVENOUS | Status: AC
  Administered 2018-10-14 – 2018-10-18 (×5): 1000 mg via INTRAVENOUS
  Filled 2018-10-13 (×5): qty 200

## 2018-10-13 NOTE — Progress Notes (Signed)
Pharmacy Antibiotic Note  [name] is a 73 y.o. female admitted on 10/11/2018 with cellulitis/osteomyelitis.  Pharmacy has been consulted for Vancomycin dosing.  Vancomycin 1250 mg IV given in ED at 0120.  Plan: Increase vancomycin to 1000mg  IV Q24h Continue ceftriaxone 2g IV Q24h Monitor clinical picture, renal function, vanc levels prn F/U C&S, abx deescalation / LOT  Height: 5\' 3"  (160 cm) Weight: 190 lb 0.6 oz (86.2 kg) IBW/kg (Calculated) : 52.4  Temp (24hrs), Avg:98.7 F (37.1 C), Min:98.6 F (37 C), Max:98.7 F (37.1 C)  Recent Labs  Lab 10/11/18 1504 10/11/18 1554 10/12/18 0400  WBC 7.3  --  5.5  CREATININE 0.76  --  0.82  LATICACIDVEN  --  1.68  --     Estimated Creatinine Clearance: 66.4 mL/min (by C-G formula based on SCr of 0.82 mg/dL).    Allergies  Allergen Reactions  . Prednisone     Had gotten thrush  . Shellfish Allergy     Hives     [name] 10/13/2018 9:26 AM

## 2018-10-13 NOTE — Progress Notes (Signed)
[facility] Progress Note  Subjective: she c/o dental pain in the R upper molars  Vitals:   10/12/18 2051 10/12/18 2107 10/13/18 0354 10/13/18 0557  BP: (!) 154/73 (!) 154/73 (!) 154/73 (!) 120/48  Pulse: 92 93 93 78  Resp:  _0 Temp:  98.7 F (37.1 C) 98.7 F (37.1 C) 98.7 F (37.1 C)  TempSrc:  Oral Oral Oral  SpO2:  100%  96%  Weight:   86.2 kg   Height:   _1  (1.6 m)     Inpatient medications: . amLODipine  5 mg Oral BID  . atorvastatin  40 mg Oral Daily  . baclofen  10 mg Oral TID  . buPROPion  300 mg Oral Daily  . carbamazepine  300 mg Oral Q breakfast   And  . carbamazepine  200 mg Oral BID  . donepezil  5 mg Oral Daily  . enoxaparin (LOVENOX) injection  40 mg Subcutaneous Daily  . fesoterodine  4 mg Oral Daily  . labetalol  100 mg Oral BID  . liver oil-zinc oxide   Topical QID  . pregabalin  150 mg Oral BID  . sertraline  75 mg Oral Daily   . sodium chloride Stopped (10/13/18 0514)  . cefTRIAXone (ROCEPHIN)  IV Stopped (10/12/18 2140)  . [START ON 10/14/2018] vancomycin     acetaminophen **OR** acetaminophen, albuterol, hydrocerin, ondansetron **OR** ondansetron (ZOFRAN) IV, traMADol, white petrolatum  Exam:  Gen: pt is alert and awake, follows commands, disoriented and has sig speech problems  Eyes: PERRL, lids and conjunctivae normal ENMT: Mucous membranes are moist. No obvious abscess on exam of the mouth and teeth, R cheek is slightly swollen perhaps compared to the L cheek Neck: normal, supple, no masses, no thyromegaly Respiratory: clear to auscultation bilaterally, no wheezing, no crakcles Cardiovascular: Regular rate and rhythm, no murmurs / rubs / gallops. No extremity edema. 2+ pedal pulses. No carotid bruits.  Abdomen: no tenderness, no masses palpated. No hepatosplenomegaly.  Musculoskeletal: no clubbing / cyanosis.  Contractures present Skin: Ulceration to the lateral aspect of the right ankle with black eschar appreciated and foul  smell Neurologic: Patient is wheelchair-bound patient has 4+/ 5 in the upper extremities, 2/5 in the right leg, and 3/5 in the left leg. Psychiatric: Poor memory. Alert and oriented x 2. Normal mood.   Presentation Summary: [name] is a 73 y.o. female with medical history significant of severe MS, HTN, trigeminal neuralgia, gait disturbance, dementia, DVT; who presents with complaints of nonhealing right ankle wound.  Which of history comes from the patient's son is her primary caregiver.  At baseline patient is fairly debilitated needs assistance with transfers.  2 weeks ago she hit her right foot on a wooden door frame while trying to transfer and sustained a cut.  Son notes that they dressed the wound with a bandage.  They initially thought that the wound was healing up until last week when they noticed foul smelling drainage coming from the wound.  Since that time he has noted that the wound was not healing and it has turned black.  Trying to get her into her primary care provider, but they were all booked up.  The son noted that she was intermittently more confused than normal and questioned if it was related to the wound.  Denies any reports of fever, cough, shortness of breath, chest pain, nausea, vomiting, diarrhea, or dysuria symptoms.  Patient denies any complaints currently at this time.  ED Course: Upon admission into the emergency department patient was noted to be afebrile with vital signs relatively within normal limits.  Labs revealed WBC 7.3, hemoglobin 11.2 with MCV of 100.8, lactic acid 1.68, CRP 9.8, and ESR 93.  X-ray images showed diffuse soft tissue swelling no signs of osseous damage.  Patient was treated with empiric antibiotics of vancomycin and Rocephin.  MRI of the right ankle was ordered for concern for osteomyelitis.       Hospital Problems/ Course:  Right ankle wound / osteomyelitis: Patient presents with right ankle wound with soft tissue swelling.  X-rays noted  soft tissue swelling.  Labs reveal significantly elevated ESR of 93 with CRP 9.8.  She was started on empiric antibiotics of vancomycin and ceftriaxone, but no blood cultures were obtained prior to antibiotics. - MRI showed osteo of the R ankle, pt has paralysis of bilat LE's w/ contractures - Dr [name] consulted and recommending R BKA, needs to get consent from POA first  - Continue empiric antibiotics of Rocephin and vancomycin  Dental pain/ R upper: - will order xrays (orthopantogram), consider consult Dr Lawana Chambers during the week if needed  Macrocytic anemia: Acute.  Hemoglobin 12 > 10.3 here, ^MCV - vitamin B12 normal, folate pending, no need transfusion  Multiple sclerosis/ bilat LE paralysis - Continue baclofen and other home meds  Trigeminal neuralgia - Continue carbamazepine and Lyrica  Essential hypertension - Continue hydralazine and labetalol  Pressure ulcers - Low-air-loss mattress - Wound care to eval  Dementia - Continue Aricept  DVT prophylaxis: lovenox  Code Status: Full  Family Communication: Discussed plan of care with the patient Disposition Plan: Discharge home once medically stable Consults called: Orthopedics Admission status: inpatient   Kelly Splinter MD [facility] pgr [phone] 10/13/2018, 12:08 PM   Recent Labs  Lab 10/11/18 1504 10/12/18 0400  NA 141 141  K 4.5 3.6  CL 109 107  CO2 23 25  GLUCOSE 101* 101*  BUN 15 17  CREATININE 0.76 0.82  CALCIUM 9.1 8.8*   Recent Labs  Lab 10/11/18 1504  AST 27  ALT 23  ALKPHOS 72  BILITOT 0.2*  PROT 7.3  ALBUMIN 3.1*   Recent Labs  Lab 10/11/18 1504 10/12/18 0400  WBC 7.3 5.5  NEUTROABS 4.5  --   HGB 11.2* 10.3*  HCT 37.1 31.1*  MCV 100.8* 96.0  PLT 218 226   Iron/TIBC/Ferritin/ %Sat No results found for: IRON, TIBC, FERRITIN, IRONPCTSAT

## 2018-10-14 LAB — BASIC METABOLIC PANEL
Anion gap: 9 (ref 5–15)
BUN: <5 mg/dL — ABNORMAL LOW (ref 8–23)
CO2: 25 mmol/L (ref 22–32)
CREATININE: 0.65 mg/dL (ref 0.44–1.00)
Calcium: 8.2 mg/dL — ABNORMAL LOW (ref 8.9–10.3)
Chloride: 105 mmol/L (ref 98–111)
GFR calc Af Amer: >60 mL/min (ref >60)
GFR calc non Af Amer: >60 mL/min (ref >60)
Glucose, Bld: 88 mg/dL (ref 70–99)
Potassium: 2.7 mmol/L — CL (ref 3.5–5.1)
Sodium: 139 mmol/L (ref 135–145)

## 2018-10-14 LAB — CBC
HCT: 29.9 % — ABNORMAL LOW (ref 36.0–46.0)
Hemoglobin: 9.8 g/dL — ABNORMAL LOW (ref 12.0–15.0)
MCH: 30.5 pg (ref 26.0–34.0)
MCHC: 32.8 g/dL (ref 30.0–36.0)
MCV: 93.1 fL (ref 80.0–100.0)
Platelets: 176 10*3/uL (ref 150–400)
RBC: 3.21 MIL/uL — ABNORMAL LOW (ref 3.87–5.11)
RDW: 12.5 % (ref 11.5–15.5)
WBC: 4 10*3/uL (ref 4.0–10.5)
nRBC: 0 % (ref 0.0–0.2)

## 2018-10-14 MED ORDER — POTASSIUM CHLORIDE 20 MEQ PO PACK
40.0000 meq | PACK | Freq: Once | ORAL | Status: AC
  Administered 2018-10-14: 40 meq via ORAL
  Filled 2018-10-14: qty 2

## 2018-10-14 MED ORDER — POTASSIUM CHLORIDE CRYS ER 20 MEQ PO TBCR
40.0000 meq | EXTENDED_RELEASE_TABLET | Freq: Once | ORAL | Status: AC
  Administered 2018-10-14: 40 meq via ORAL
  Filled 2018-10-14: qty 2

## 2018-10-14 NOTE — Plan of Care (Signed)
  Problem: Education: Goal: Knowledge of General Education information will improve Description: Including pain rating scale, medication(s)/side effects and non-pharmacologic comfort measures Outcome: Progressing   Problem: Clinical Measurements: Goal: Ability to maintain clinical measurements within normal limits will improve Outcome: Progressing Goal: Will remain free from infection Outcome: Progressing   

## 2018-10-14 NOTE — H&P (View-Only) (Signed)
Patient ID: [name], female   DOB: [birth year], 73 y.o.   MRN: [id number] Patient is seen in follow-up for abscess osteomyelitis ulceration and necrosis right ankle.  Patient's son who is the power of attorney is at bedside.  Discussed recommended treatment with a right transtibial amputation.  Risks and benefits were discussed including risk of the infection risk of the wound not healing.  Patient and son state they understand and wish to proceed at this time.  We will plan for right transtibial amputation on Tuesday or Wednesday.  Does use her right leg to pivot for transfers and will most likely require a K1 prosthesis on the right.  Permit was signed today at bedside.

## 2018-10-14 NOTE — Progress Notes (Signed)
Patient ID: [name], female   DOB: 08/29/1946, 72 y.o.   MRN: [id number] Patient is seen in follow-up for abscess osteomyelitis ulceration and necrosis right ankle.  Patient's son who is the power of attorney is at bedside.  Discussed recommended treatment with a right transtibial amputation.  Risks and benefits were discussed including risk of the infection risk of the wound not healing.  Patient and son state they understand and wish to proceed at this time.  We will plan for right transtibial amputation on Tuesday or Wednesday.  Does use her right leg to pivot for transfers and will most likely require a K1 prosthesis on the right.  Permit was signed today at bedside.

## 2018-10-14 NOTE — Progress Notes (Signed)
CRITICAL VALUE ALERT  Critical Value:  K+ 2.7  Date & Time Notied:  10/14/2018 0630  Provider Notified: yes  Orders Received/Actions taken:

## 2018-10-14 NOTE — Progress Notes (Signed)
Patient Demographics:    [name], is a 73 y.o. female, DOB - 11-02-1945, Meagan:[id number]  Admit date - 10/11/2018   Admitting Physician Norval Morton, MD  Outpatient Primary MD for the patient is [name], MD  LOS - 2   Chief Complaint  Patient presents with  . Wound Infection        Subjective:    [name] today has no fevers, no emesis,  No chest pain, dental pain is better,  Assessment  & Plan :    Principal Problem:   Subacute osteomyelitis, right ankle and foot (HCC) Active Problems:   Multiple sclerosis (HCC)   Hypertension   Organic brain syndrome   Decubitus ulcer, buttock   Tooth pain   Memory difficulties   Neurogenic bladder   Bedbound   Cellulitis of right lower extremity  Brief summary  73 y.o. female with medical history significant of severe MS, HTN, trigeminal neuralgia, gait disturbance, dementia, DVT admitted 10/12/2018 with right ankle/foot ulcer and osteomyelitis, now awaiting right BKA  Plan:-  1)Rt Ankle Wound/osteomyelitis--- currently on Rocephin and vancomycin, failed conservative measures, Dr. [name] obtained consent from patient's POA/son, scheduled for right BKA later this week  2) multiple sclerosis/baseline bilateral lower extremity paralysis--- continue baclofen, at baseline patient has significant difficulties with mobility related ADLs  3)HTN--stable, continue labetalol and hydralazine  4) dementia--stable, with cognitive deficits continue Aricept  5) history of trigeminal neuralgia--- complains of dental pain, dental x-rays pending, continue Lyrica and Tegretol  6) chronic microcytic anemia--- B12 levels are normal monitor closely and transfuse as clinically indicated  Disposition/Need for in-Hospital Stay- patient unable to be discharged at this time due to right ankle osteomyelitis requiring surgical intervention*  Code Status :  Full  Family Communication:   None at bedside   Disposition Plan  : TBD  Consults  :  Ortho ([facility])  DVT Prophylaxis  :  Lovenox    Lab Results  Component Value Date   PLT 176 10/14/2018    Inpatient Medications  Scheduled Meds: . amLODipine  5 mg Oral BID  . atorvastatin  40 mg Oral Daily  . baclofen  10 mg Oral TID  . buPROPion  300 mg Oral Daily  . carbamazepine  300 mg Oral Q breakfast   And  . carbamazepine  200 mg Oral BID  . donepezil  5 mg Oral Daily  . enoxaparin (LOVENOX) injection  40 mg Subcutaneous Daily  . fesoterodine  4 mg Oral Daily  . labetalol  100 mg Oral BID  . liver oil-zinc oxide   Topical QID  . potassium chloride  40 mEq Oral Once  . pregabalin  150 mg Oral BID  . sertraline  75 mg Oral Daily   Continuous Infusions: . sodium chloride 40 mL/hr at 10/14/18 1102  . cefTRIAXone (ROCEPHIN)  IV 2 g (10/13/18 2230)  . vancomycin 1,000 mg (10/14/18 1004)   PRN Meds:.acetaminophen **OR** acetaminophen, albuterol, hydrocerin, ondansetron **OR** ondansetron (ZOFRAN) IV, traMADol, white petrolatum    Anti-infectives (From admission, onward)   Start     Dose/Rate Route Frequency Ordered Stop   10/14/18 0600  vancomycin (VANCOCIN) IVPB 1000 mg/200 mL premix     1,000 mg 200 mL/hr over 60 Minutes  Intravenous Every 24 hours 10/13/18 0925     10/13/18 0600  vancomycin (VANCOCIN) IVPB 750 mg/150 ml premix  Status:  Discontinued     750 mg 150 mL/hr over 60 Minutes Intravenous Every 24 hours 10/12/18 0513 10/13/18 0925   10/12/18 2200  cefTRIAXone (ROCEPHIN) 2 g in sodium chloride 0.9 % 100 mL IVPB     2 g 200 mL/hr over 30 Minutes Intravenous Every 24 hours 10/12/18 0506     10/11/18 2330  vancomycin (VANCOCIN) 1,250 mg in sodium chloride 0.9 % 250 mL IVPB     1,250 mg 166.7 mL/hr over 90 Minutes Intravenous  Once 10/11/18 2305 10/12/18 0252   10/11/18 2315  cefTRIAXone (ROCEPHIN) 2 g in sodium chloride 0.9 % 100 mL IVPB     2 g 200 mL/hr over 30  Minutes Intravenous  Once 10/11/18 2305 10/12/18 0015        Objective:   Vitals:   10/13/18 2233 10/13/18 2235 10/14/18 0543 10/14/18 1447  BP: (!) 144/101 (!) 144/119 (!) 148/95 112/88  Pulse: 82  83 90  Resp: 17  16 16   Temp: 98.8 F (37.1 C)  98.6 F (37 C) 98.3 F (36.8 C)  TempSrc: Oral  Oral Oral  SpO2: 97%  97% 100%  Weight:      Height:        Wt Readings from Last 3 Encounters:  10/13/18 86.2 kg  05/23/12 58.1 kg  09/23/11 67.2 kg     Intake/Output Summary (Last 24 hours) at 10/14/2018 1640 Last data filed at 10/14/2018 1447 Gross per 24 hour  Intake 130 ml  Output 650 ml  Net -520 ml     Physical Exam Patient is examined daily including today on 10/14/18 , exams remain the same as of yesterday except that has changed   Gen:- Awake Alert,   HEENT:- [place].AT, No sclera icterus Neck-Supple Neck,No JVD,.  Lungs-  CTAB , fair symmetrical air movement CV- S1, S2 normal, regular  Abd-  +ve B.Sounds, Abd Soft, No tenderness,    Extremity:-, pedal pulses present  Psych-affect is appropriate, oriented x3 Neuro-baseline neuromuscular deficits consistent with multiple sclerosis mostly affecting the lower extremities MSK--right lateral malleolus with open ulcer/wound  Data Review:   Micro Results Recent Results (from the past 240 hour(s))  Culture, blood (Routine x 2)     Status: None (Preliminary result)   Collection Time: 10/11/18  3:24 PM  Result Value Ref Range Status   Specimen Description BLOOD LEFT WRIST  Final   Special Requests   Final    BOTTLES DRAWN AEROBIC AND ANAEROBIC Blood Culture adequate volume   Culture NO GROWTH 3 DAYS  Final   Report Status PENDING  Incomplete  Culture, blood (Routine x 2)     Status: None (Preliminary result)   Collection Time: 10/11/18  3:39 PM  Result Value Ref Range Status   Specimen Description BLOOD LEFT HAND  Final   Special Requests   Final    BOTTLES DRAWN AEROBIC ONLY Blood Culture results may not be optimal due  to an inadequate volume of blood received in culture bottles Performed at [facility], [address], [place], [place] [address]    Culture NO GROWTH 3 DAYS  Final   Report Status PENDING  Incomplete    Radiology Reports Dg Tibia/fibula Right  Result Date: 10/11/2018 CLINICAL DATA:  Ankle wound. Struck foot on wooden door 2 weeks ago. Redness around wound spreading up leg. EXAM: RIGHT  TIBIA AND FIBULA - 2 VIEW COMPARISON:  Concurrent ankle in foot radiographs. FINDINGS: Soft tissue defect about the lateral ankle, better assessed on concurrent ankle radiographs. Diffuse soft tissue edema of the lower leg. No fracture, dislocation, or bony destructive change. Degenerative change of the knee is partially included. IMPRESSION: 1. Soft tissue defect/wound about the lateral ankle, better assessed on concurrent ankle radiographs. Diffuse soft tissue edema of the lower leg. 2. No acute osseous abnormality. Electronically Signed   By: Keith Rake M.D.   On: 10/11/2018 23:06   Dg Ankle Complete Right  Result Date: 10/11/2018 CLINICAL DATA:  Ankle wound. Struck foot on wooden door 2 weeks ago. Redness around wound spreading up leg. EXAM: RIGHT ANKLE - COMPLETE 3+ VIEW COMPARISON:  None. FINDINGS: Soft tissue defect about the lateral ankle. No tracking soft tissue air. Diffuse soft tissue edema about the ankle. No bony destructive change, periosteal reaction, fracture or dislocation. The ankle mortise is preserved. IMPRESSION: Soft tissue defect about the lateral ankle with diffuse soft tissue edema. No tracking soft tissue air. No radiographic findings of osteomyelitis or acute osseous abnormality. Electronically Signed   By: Keith Rake M.D.   On: 10/11/2018 23:07   Mr Ankle Right W Wo Contrast  Result Date: 10/12/2018 CLINICAL DATA:  Nonhealing lateral ankle wound following injury 2 weeks ago. Increasing lower extremity erythema. EXAM: MRI OF THE RIGHT ANKLE WITHOUT AND WITH CONTRAST  TECHNIQUE: Multiplanar, multisequence MR imaging of the ankle was performed before and after the administration of intravenous contrast. CONTRAST:  8 cc Gadavist COMPARISON:  Radiographs 10/11/2018. FINDINGS: TENDONS Peroneal: Intact and normally positioned. No significant tenosynovitis or abnormal enhancement. Posteromedial: Intact and normally positioned. Anterior: Intact and normally positioned. Achilles: Intact. Plantar Fascia: Intact. LIGAMENTS Lateral: The anterior and posterior talofibular and calcaneofibular ligaments are intact. Medial: The deltoid and visualized portions of the spring ligament appear intact. CARTILAGE AND BONES Ankle Joint: No significant ankle joint effusion or abnormal synovial enhancement. Mild tibiotalar degenerative changes. Subtalar Joints/Sinus Tarsi: Unremarkable. Bones: There is a large skin defect lateral to the lateral malleolus with an underlying large area of increased T2 signal and absent enhancement in the subjacent soft tissues. The area of absent enhancement measures 2.8 x 2.4 x 1.1 cm and abuts the lateral malleolus. There is an underlying small subperiosteal abscess measuring up to 17 mm on coronal image 19/5. This is associated with cortical destruction of the distal fibula laterally, underlying marrow edema and enhancement. No other suspicious marrow lesions. Other: In addition to the focal ulcer lateral to the lateral malleolus, there is generalized subcutaneous edema in the lower leg, extending into the dorsum of the foot. No other focal fluid collections are identified. IMPRESSION: IMPRESSION 1. Large soft tissue ulcer lateral to the lateral malleolus with underlying soft tissue necrosis and a small subperiosteal abscess of the distal fibula. 2. Mild osteomyelitis of the lateral malleolus. No evidence of septic joint. 3. Generalized soft tissue edema consistent with cellulitis. No focal fluid collections. 4. Intact ankle tendons and ligaments. 5. Preliminary results  of this case were discussed between Dr. Radene Knee and Dr. Tamala Julian at 0416 hours on 10/12/2018. Electronically Signed   By: Richardean Sale M.D.   On: 10/12/2018 07:49   Dg Foot Complete Right  Result Date: 10/11/2018 CLINICAL DATA:  Ankle wound. Struck foot on wooden door 2 weeks ago. Redness around wound spreading up leg. EXAM: RIGHT FOOT COMPLETE - 3+ VIEW COMPARISON:  Concurrent ankle radiographs. FINDINGS: Soft tissue defect about  the lateral ankle better assessed on concurrent ankle exam. Diffuse soft tissue edema about the foot. No fracture, dislocation, or bony destructive change. Scattered osteoarthritis in the midfoot and about the digits. IMPRESSION: Soft tissue defect about the lateral ankle better assessed on concurrent ankle radiographs. Diffuse soft tissue edema of the foot. No acute osseous abnormality. Electronically Signed   By: Keith Rake M.D.   On: 10/11/2018 23:08     CBC Recent Labs  Lab 10/11/18 1504 10/12/18 0400 10/14/18 0509  WBC 7.3 5.5 4.0  HGB 11.2* 10.3* 9.8*  HCT 37.1 31.1* 29.9*  PLT 218 226 176  MCV 100.8* 96.0 93.1  MCH 30.4 31.8 30.5  MCHC 30.2 33.1 32.8  RDW 12.8 12.9 12.5  LYMPHSABS 1.4  --   --   MONOABS 1.2*  --   --   EOSABS 0.0  --   --   BASOSABS 0.0  --   --     Chemistries  Recent Labs  Lab 10/11/18 1504 10/12/18 0400 10/14/18 0509  NA 141 141 139  K 4.5 3.6 2.7*  CL 109 107 105  CO2 23 25 25   GLUCOSE 101* 101* 88  BUN 15 17 <5*  CREATININE 0.76 0.82 0.65  CALCIUM 9.1 8.8* 8.2*  AST 27  --   --   ALT 23  --   --   ALKPHOS 72  --   --   BILITOT 0.2*  --   --    ------------------------------------------------------------------------------------------------------------------ No results for input(s): CHOL, HDL, LDLCALC, TRIG, CHOLHDL, LDLDIRECT in the last 72 hours.  No results found for: HGBA1C ------------------------------------------------------------------------------------------------------------------ No results for  input(s): TSH, T4TOTAL, T3FREE, THYROIDAB in the last 72 hours.  Invalid input(s): FREET3 ------------------------------------------------------------------------------------------------------------------ Recent Labs    10/12/18 0726  VITAMINB12 1,624*    Coagulation profile No results for input(s): INR, PROTIME in the last 168 hours.  No results for input(s): DDIMER in the last 72 hours.  Cardiac Enzymes No results for input(s): CKMB, TROPONINI, MYOGLOBIN in the last 168 hours.  Invalid input(s): CK ------------------------------------------------------------------------------------------------------------------ No results found for: BNP   Roxan Hockey M.D on 10/14/2018 at 4:40 PM  Pager---[phone] Go to [email or web address] - password [email or web address] for contact info  [facility] - Office  [phone]

## 2018-10-15 LAB — BASIC METABOLIC PANEL
Anion gap: 10 (ref 5–15)
BUN: 5 mg/dL — AB (ref 8–23)
CO2: 22 mmol/L (ref 22–32)
Calcium: 8.6 mg/dL — ABNORMAL LOW (ref 8.9–10.3)
Chloride: 107 mmol/L (ref 98–111)
Creatinine, Ser: 0.7 mg/dL (ref 0.44–1.00)
GFR calc Af Amer: >60 mL/min (ref >60)
Glucose, Bld: 97 mg/dL (ref 70–99)
POTASSIUM: 3.3 mmol/L — AB (ref 3.5–5.1)
Sodium: 139 mmol/L (ref 135–145)

## 2018-10-15 LAB — FOLATE RBC
FOLATE, RBC: 2088 ng/mL (ref >498)
Folate, Hemolysate: 618 ng/mL
Hematocrit: 29.6 % — ABNORMAL LOW (ref 34.0–46.6)

## 2018-10-15 MED ORDER — POTASSIUM CHLORIDE CRYS ER 20 MEQ PO TBCR
40.0000 meq | EXTENDED_RELEASE_TABLET | Freq: Once | ORAL | Status: AC
  Administered 2018-10-15: 40 meq via ORAL
  Filled 2018-10-15: qty 2

## 2018-10-15 MED ORDER — AMLODIPINE BESYLATE 10 MG PO TABS
10.0000 mg | ORAL_TABLET | Freq: Every day | ORAL | Status: DC
  Administered 2018-10-16 – 2018-10-25 (×10): 10 mg via ORAL
  Filled 2018-10-15 (×10): qty 1

## 2018-10-15 MED ORDER — HYDRALAZINE HCL 20 MG/ML IJ SOLN: 10.0000 mg | Freq: Four times a day (QID) | INTRAMUSCULAR | Status: DC | PRN

## 2018-10-15 MED ORDER — PNEUMOCOCCAL VAC POLYVALENT 25 MCG/0.5ML IJ INJ
0.5000 mL | INJECTION | INTRAMUSCULAR | Status: AC
  Administered 2018-10-16: 0.5 mL via INTRAMUSCULAR
  Filled 2018-10-15: qty 0.5

## 2018-10-15 NOTE — Progress Notes (Signed)
Throughout this shift patient confused. Calling out all shift for [name], her son. Did not sleep. Calmed down with each reassurance given. Continuously wanted out of the bed but did not attempt to get out on her own. Door open to nurse station.

## 2018-10-15 NOTE — Care Management Important Message (Signed)
Important Message  Patient Details  Name: [name] MRN: [id number] Date of Birth: 1945-11-01   Medicare Important Message Given:  Yes    Orbie Pyo 10/15/2018, 4:17 PM

## 2018-10-15 NOTE — Progress Notes (Signed)
Patient Demographics:    [name], is a 73 y.o. female, DOB - 1946/05/15, YKZ:[id number]  Admit date - 10/11/2018   Admitting Physician Norval Morton, MD  Outpatient Primary MD for the patient is [name], MD  LOS - 3   Chief Complaint  Patient presents with  . Wound Infection        Subjective:    [name] today has no fevers, no emesis,  No chest pain, dental pain is better, intermittently confused  Assessment  & Plan :    Principal Problem:   Subacute osteomyelitis, right ankle and foot (HCC) Active Problems:   Multiple sclerosis (HCC)   Hypertension   Organic brain syndrome   Decubitus ulcer, buttock   Tooth pain   Memory difficulties   Neurogenic bladder   Bedbound   Cellulitis of right lower extremity  Brief summary  73 y.o. female with medical history significant of severe MS, HTN, trigeminal neuralgia, gait disturbance, dementia, DVT admitted 10/12/2018 with right ankle/foot ulcer and osteomyelitis, now awaiting right BKA  Plan:-  1)Rt Ankle Wound/osteomyelitis--- currently on Rocephin and vancomycin, failed conservative measures, Dr. [name] obtained consent from patient's POA/son, scheduled for right BKA later this week  2) multiple sclerosis/baseline bilateral lower extremity paralysis--- continue baclofen, at baseline patient has significant difficulties with mobility related ADLs  3)HTN--stable, continue labetalol 100 mg bid and change amlodipine to 10 mg daily,   may use IV Hydralazine 10 mg  Every 4 hours Prn for systolic blood pressure over 160 mmhg  4)Dementia--stable, with cognitive deficits continue Aricept  5) history of trigeminal neuralgia--- complains of dental pain, dental x-rays pending, continue Lyrica and Tegretol  6) chronic microcytic anemia--- B12 levels are normal monitor closely and transfuse as clinically indicated  Disposition/Need for  in-Hospital Stay- patient unable to be discharged at this time due to right ankle osteomyelitis requiring surgical intervention (Rt BKA pending)*  Code Status : Full  Family Communication:   None at bedside   Disposition Plan  : TBD  Consults  :  Ortho ([facility])  DVT Prophylaxis  :  Lovenox    Lab Results  Component Value Date   PLT 176 10/14/2018    Inpatient Medications  Scheduled Meds: . amLODipine  5 mg Oral BID  . atorvastatin  40 mg Oral Daily  . baclofen  10 mg Oral TID  . buPROPion  300 mg Oral Daily  . carbamazepine  300 mg Oral Q breakfast   And  . carbamazepine  200 mg Oral BID  . donepezil  5 mg Oral Daily  . enoxaparin (LOVENOX) injection  40 mg Subcutaneous Daily  . fesoterodine  4 mg Oral Daily  . labetalol  100 mg Oral BID  . liver oil-zinc oxide   Topical QID  . potassium chloride  40 mEq Oral Once  . pregabalin  150 mg Oral BID  . sertraline  75 mg Oral Daily   Continuous Infusions: . sodium chloride 40 mL/hr at 10/15/18 1502  . cefTRIAXone (ROCEPHIN)  IV 2 g (10/14/18 2112)  . vancomycin Stopped (10/15/18 0532)   PRN Meds:.acetaminophen **OR** acetaminophen, albuterol, hydrocerin, ondansetron **OR** ondansetron (ZOFRAN) IV, traMADol, white petrolatum    Anti-infectives (From admission, onward)   Start  Dose/Rate Route Frequency Ordered Stop   10/14/18 0600  vancomycin (VANCOCIN) IVPB 1000 mg/200 mL premix     1,000 mg 200 mL/hr over 60 Minutes Intravenous Every 24 hours 10/13/18 0925     10/13/18 0600  vancomycin (VANCOCIN) IVPB 750 mg/150 ml premix  Status:  Discontinued     750 mg 150 mL/hr over 60 Minutes Intravenous Every 24 hours 10/12/18 0513 10/13/18 0925   10/12/18 2200  cefTRIAXone (ROCEPHIN) 2 g in sodium chloride 0.9 % 100 mL IVPB     2 g 200 mL/hr over 30 Minutes Intravenous Every 24 hours 10/12/18 0506     10/11/18 2330  vancomycin (VANCOCIN) 1,250 mg in sodium chloride 0.9 % 250 mL IVPB     1,250 mg 166.7 mL/hr over 90  Minutes Intravenous  Once 10/11/18 2305 10/12/18 0252   10/11/18 2315  cefTRIAXone (ROCEPHIN) 2 g in sodium chloride 0.9 % 100 mL IVPB     2 g 200 mL/hr over 30 Minutes Intravenous  Once 10/11/18 2305 10/12/18 0015        Objective:   Vitals:   10/14/18 2033 10/15/18 0442 10/15/18 0444 10/15/18 1326  BP: (!) 181/71 (!) 169/68 137/67 (!) 135/55  Pulse: 84 82 84 95  Resp: 18 17    Temp: 99.1 F (37.3 C) 98.9 F (37.2 C)  98.6 F (37 C)  TempSrc: Oral Oral  Oral  SpO2: 100% 99%  100%  Weight:      Height:        Wt Readings from Last 3 Encounters:  10/13/18 86.2 kg  05/23/12 58.1 kg  09/23/11 67.2 kg    Intake/Output Summary (Last 24 hours) at 10/15/2018 1555 Last data filed at 10/15/2018 1502 Gross per 24 hour  Intake 2390.95 ml  Output -  Net 2390.95 ml    Physical Exam Patient is examined daily including today on 10/15/18 , exams remain the same as of yesterday except that has changed   Gen:- Awake Alert,   HEENT:- [place].AT, No sclera icterus Neck-Supple Neck,No JVD,.  Lungs-  CTAB , fair symmetrical air movement CV- S1, S2 normal, regular  Abd-  +ve B.Sounds, Abd Soft, No tenderness,    Extremity:-, pedal pulses present  Psych-affect is appropriate, oriented x 2, intermittent confusion and disorientation Neuro-baseline neuromuscular deficits consistent with multiple sclerosis mostly affecting the lower extremities MSK--right lateral malleolus with open ulcer/wound  Data Review:   Micro Results Recent Results (from the past 240 hour(s))  Culture, blood (Routine x 2)     Status: None (Preliminary result)   Collection Time: 10/11/18  3:24 PM  Result Value Ref Range Status   Specimen Description BLOOD LEFT WRIST  Final   Special Requests   Final    BOTTLES DRAWN AEROBIC AND ANAEROBIC Blood Culture adequate volume   Culture NO GROWTH 4 DAYS  Final   Report Status PENDING  Incomplete  Culture, blood (Routine x 2)     Status: None (Preliminary result)   Collection  Time: 10/11/18  3:39 PM  Result Value Ref Range Status   Specimen Description BLOOD LEFT HAND  Final   Special Requests   Final    BOTTLES DRAWN AEROBIC ONLY Blood Culture results may not be optimal due to an inadequate volume of blood received in culture bottles Performed at [facility], [address] [place], [place] [address]    Culture NO GROWTH 4 DAYS  Final   Report Status PENDING  Incomplete  Radiology Reports Dg Tibia/fibula Right  Result Date: 10/11/2018 CLINICAL DATA:  Ankle wound. Struck foot on wooden door 2 weeks ago. Redness around wound spreading up leg. EXAM: RIGHT TIBIA AND FIBULA - 2 VIEW COMPARISON:  Concurrent ankle in foot radiographs. FINDINGS: Soft tissue defect about the lateral ankle, better assessed on concurrent ankle radiographs. Diffuse soft tissue edema of the lower leg. No fracture, dislocation, or bony destructive change. Degenerative change of the knee is partially included. IMPRESSION: 1. Soft tissue defect/wound about the lateral ankle, better assessed on concurrent ankle radiographs. Diffuse soft tissue edema of the lower leg. 2. No acute osseous abnormality. Electronically Signed   By: Keith Rake M.D.   On: 10/11/2018 23:06   Dg Ankle Complete Right  Result Date: 10/11/2018 CLINICAL DATA:  Ankle wound. Struck foot on wooden door 2 weeks ago. Redness around wound spreading up leg. EXAM: RIGHT ANKLE - COMPLETE 3+ VIEW COMPARISON:  None. FINDINGS: Soft tissue defect about the lateral ankle. No tracking soft tissue air. Diffuse soft tissue edema about the ankle. No bony destructive change, periosteal reaction, fracture or dislocation. The ankle mortise is preserved. IMPRESSION: Soft tissue defect about the lateral ankle with diffuse soft tissue edema. No tracking soft tissue air. No radiographic findings of osteomyelitis or acute osseous abnormality. Electronically Signed   By: Keith Rake M.D.   On: 10/11/2018 23:07   Mr Ankle Right W Wo  Contrast  Result Date: 10/12/2018 CLINICAL DATA:  Nonhealing lateral ankle wound following injury 2 weeks ago. Increasing lower extremity erythema. EXAM: MRI OF THE RIGHT ANKLE WITHOUT AND WITH CONTRAST TECHNIQUE: Multiplanar, multisequence MR imaging of the ankle was performed before and after the administration of intravenous contrast. CONTRAST:  8 cc Gadavist COMPARISON:  Radiographs 10/11/2018. FINDINGS: TENDONS Peroneal: Intact and normally positioned. No significant tenosynovitis or abnormal enhancement. Posteromedial: Intact and normally positioned. Anterior: Intact and normally positioned. Achilles: Intact. Plantar Fascia: Intact. LIGAMENTS Lateral: The anterior and posterior talofibular and calcaneofibular ligaments are intact. Medial: The deltoid and visualized portions of the spring ligament appear intact. CARTILAGE AND BONES Ankle Joint: No significant ankle joint effusion or abnormal synovial enhancement. Mild tibiotalar degenerative changes. Subtalar Joints/Sinus Tarsi: Unremarkable. Bones: There is a large skin defect lateral to the lateral malleolus with an underlying large area of increased T2 signal and absent enhancement in the subjacent soft tissues. The area of absent enhancement measures 2.8 x 2.4 x 1.1 cm and abuts the lateral malleolus. There is an underlying small subperiosteal abscess measuring up to 17 mm on coronal image 19/5. This is associated with cortical destruction of the distal fibula laterally, underlying marrow edema and enhancement. No other suspicious marrow lesions. Other: In addition to the focal ulcer lateral to the lateral malleolus, there is generalized subcutaneous edema in the lower leg, extending into the dorsum of the foot. No other focal fluid collections are identified. IMPRESSION: IMPRESSION 1. Large soft tissue ulcer lateral to the lateral malleolus with underlying soft tissue necrosis and a small subperiosteal abscess of the distal fibula. 2. Mild osteomyelitis  of the lateral malleolus. No evidence of septic joint. 3. Generalized soft tissue edema consistent with cellulitis. No focal fluid collections. 4. Intact ankle tendons and ligaments. 5. Preliminary results of this case were discussed between Dr. Radene Knee and Dr. Tamala Julian at 0416 hours on 10/12/2018. Electronically Signed   By: Richardean Sale M.D.   On: 10/12/2018 07:49   Dg Foot Complete Right  Result Date: 10/11/2018 CLINICAL DATA:  Ankle wound. Struck  foot on wooden door 2 weeks ago. Redness around wound spreading up leg. EXAM: RIGHT FOOT COMPLETE - 3+ VIEW COMPARISON:  Concurrent ankle radiographs. FINDINGS: Soft tissue defect about the lateral ankle better assessed on concurrent ankle exam. Diffuse soft tissue edema about the foot. No fracture, dislocation, or bony destructive change. Scattered osteoarthritis in the midfoot and about the digits. IMPRESSION: Soft tissue defect about the lateral ankle better assessed on concurrent ankle radiographs. Diffuse soft tissue edema of the foot. No acute osseous abnormality. Electronically Signed   By: Keith Rake M.D.   On: 10/11/2018 23:08     CBC Recent Labs  Lab 10/11/18 1504 10/12/18 0400 10/12/18 0726 10/14/18 0509  WBC 7.3 5.5  --  4.0  HGB 11.2* 10.3*  --  9.8*  HCT 37.1 31.1* 29.6* 29.9*  PLT 218 226  --  176  MCV 100.8* 96.0  --  93.1  MCH 30.4 31.8  --  30.5  MCHC 30.2 33.1  --  32.8  RDW 12.8 12.9  --  12.5  LYMPHSABS 1.4  --   --   --   MONOABS 1.2*  --   --   --   EOSABS 0.0  --   --   --   BASOSABS 0.0  --   --   --     Chemistries  Recent Labs  Lab 10/11/18 1504 10/12/18 0400 10/14/18 0509 10/15/18 0154  NA 141 141 139 139  K 4.5 3.6 2.7* 3.3*  CL 109 107 105 107  CO2 23 25 25 22   GLUCOSE 101* 101* 88 97  BUN 15 17 <5* 5*  CREATININE 0.76 0.82 0.65 0.70  CALCIUM 9.1 8.8* 8.2* 8.6*  AST 27  --   --   --   ALT 23  --   --   --   ALKPHOS 72  --   --   --   BILITOT 0.2*  --   --   --     ------------------------------------------------------------------------------------------------------------------ No results for input(s): CHOL, HDL, LDLCALC, TRIG, CHOLHDL, LDLDIRECT in the last 72 hours.  No results found for: HGBA1C ------------------------------------------------------------------------------------------------------------------ No results for input(s): TSH, T4TOTAL, T3FREE, THYROIDAB in the last 72 hours.  Invalid input(s): FREET3 ------------------------------------------------------------------------------------------------------------------ No results for input(s): VITAMINB12, FOLATE, FERRITIN, TIBC, IRON, RETICCTPCT in the last 72 hours.  Coagulation profile No results for input(s): INR, PROTIME in the last 168 hours.  No results for input(s): DDIMER in the last 72 hours.  Cardiac Enzymes No results for input(s): CKMB, TROPONINI, MYOGLOBIN in the last 168 hours.  Invalid input(s): CK ------------------------------------------------------------------------------------------------------------------ No results found for: BNP   Roxan Hockey M.D on 10/15/2018 at 3:55 PM  Pager---[phone] Go to [email or web address] - password [email or web address] for contact info  [facility] - Office  [phone]

## 2018-10-16 ENCOUNTER — Inpatient Hospital Stay (HOSPITAL_COMMUNITY): Payer: Medicare HMO

## 2018-10-16 ENCOUNTER — Ambulatory Visit (INDEPENDENT_AMBULATORY_CARE_PROVIDER_SITE_OTHER): Payer: Self-pay | Admitting: Physician Assistant

## 2018-10-16 LAB — CULTURE, BLOOD (ROUTINE X 2)
Culture: NO GROWTH
Culture: NO GROWTH
Special Requests: ADEQUATE

## 2018-10-16 LAB — BASIC METABOLIC PANEL
Anion gap: 10 (ref 5–15)
BUN: 7 mg/dL — ABNORMAL LOW (ref 8–23)
CO2: 23 mmol/L (ref 22–32)
Calcium: 8.7 mg/dL — ABNORMAL LOW (ref 8.9–10.3)
Chloride: 107 mmol/L (ref 98–111)
Creatinine, Ser: 0.72 mg/dL (ref 0.44–1.00)
GFR calc Af Amer: >60 mL/min (ref >60)
GFR calc non Af Amer: >60 mL/min (ref >60)
Glucose, Bld: 94 mg/dL (ref 70–99)
Potassium: 3.9 mmol/L (ref 3.5–5.1)
Sodium: 140 mmol/L (ref 135–145)

## 2018-10-16 LAB — CBC
HCT: 31.1 % — ABNORMAL LOW (ref 36.0–46.0)
Hemoglobin: 9.9 g/dL — ABNORMAL LOW (ref 12.0–15.0)
MCH: 30.3 pg (ref 26.0–34.0)
MCHC: 31.8 g/dL (ref 30.0–36.0)
MCV: 95.1 fL (ref 80.0–100.0)
Platelets: 219 10*3/uL (ref 150–400)
RBC: 3.27 MIL/uL — AB (ref 3.87–5.11)
RDW: 13 % (ref 11.5–15.5)
WBC: 6.2 10*3/uL (ref 4.0–10.5)
nRBC: 0 % (ref 0.0–0.2)

## 2018-10-16 MED ORDER — POTASSIUM CHLORIDE CRYS ER 20 MEQ PO TBCR: 40.0000 meq | EXTENDED_RELEASE_TABLET | Freq: Once | ORAL | Status: DC

## 2018-10-16 MED ORDER — INTERFERON BETA-1A 30 MCG/0.5ML IM AJKT: 30.0000 ug | AUTO-INJECTOR | INTRAMUSCULAR | Status: DC

## 2018-10-16 MED ORDER — OXYCODONE HCL 5 MG PO TABS
5.0000 mg | ORAL_TABLET | Freq: Once | ORAL | Status: AC
  Administered 2018-10-16: 5 mg via ORAL
  Filled 2018-10-16: qty 1

## 2018-10-16 MED ORDER — INTERFERON BETA-1A 30 MCG/0.5ML IM AJKT
30.0000 ug | AUTO-INJECTOR | INTRAMUSCULAR | Status: DC
  Administered 2018-10-24: 30 ug via INTRAMUSCULAR
  Filled 2018-10-16 (×5): qty 1

## 2018-10-16 NOTE — Progress Notes (Signed)
Pharmacy Antibiotic Note  SHAKENNA HERRERO is a 73 y.o. female admitted on 10/11/2018 with cellulitis/osteomyelitis.  Pharmacy has been consulted for vancomycin dosing.    Renal function stable, afebrile, WBC WNL.   Plan: Continue vanc 1gm IV Q24H for AUC 481 using SCr 0.8 CTX 2g IV Q24H per MD Monitor renal fxn, clinical progress, vanc AUC if continued on abx post amputation  Height: 5\' 3"  (160 cm) Weight: 190 lb 0.6 oz (86.2 kg) IBW/kg (Calculated) : 52.4  Temp (24hrs), Avg:98.9 F (37.2 C), Min:98.6 F (37 C), Max:99.5 F (37.5 C)  Recent Labs  Lab 10/11/18 1504 10/11/18 1554 10/12/18 0400 10/14/18 0509 10/15/18 0154 10/16/18 0414  WBC 7.3  --  5.5 4.0  --  6.2  CREATININE 0.76  --  0.82 0.65 0.70 0.72  LATICACIDVEN  --  1.68  --   --   --   --     Estimated Creatinine Clearance: 68.1 mL/min (by C-G formula based on SCr of 0.72 mg/dL).    Allergies  Allergen Reactions  . Prednisone     Had gotten thrush  . Shellfish Allergy     Hives    Vanc 1/3 >> CTX 1/3 >>  1/2 BCx - negative  Kalla Watson D. Mina Marble, PharmD, BCPS, [place] 10/16/2018, 10:44 AM

## 2018-10-16 NOTE — Progress Notes (Signed)
Patient Demographics:    [name], is a 73 y.o. female, DOB - [birth year], IRJ:[id number]  Admit date - 10/11/2018   Admitting Physician Norval Morton, MD  Outpatient Primary MD for the patient is [name], MD  LOS - 4   Chief Complaint  Patient presents with  . Wound Infection        Subjective:    [name] today has no fevers, no emesis,  No chest pain, dental pain is better, intermittently confused, son visited...   Assessment  & Plan :    Principal Problem:   Subacute osteomyelitis, right ankle and foot (HCC) Active Problems:   Multiple sclerosis (HCC)   Hypertension   Organic brain syndrome   Decubitus ulcer, buttock   Tooth pain   Memory difficulties   Neurogenic bladder   Bedbound   Cellulitis of right lower extremity  Brief summary  73 y.o. female with medical history significant of severe MS, HTN, trigeminal neuralgia, gait disturbance, dementia, DVT who was admitted 10/12/2018 with right ankle/foot ulcer and osteomyelitis, now awaiting right BKA by Dr [name] on 10/17/18  Plan:-  1)Rt Ankle Wound/osteomyelitis--- currently on Rocephin and vancomycin, failed conservative measures, Dr. [name] obtained consent from patient's POA/son, scheduled for right BKA on 10/17/18  2)Multiple Sclerosis/baseline bilateral lower extremity paralysis--- continue baclofen, at baseline patient has significant difficulties with mobility related ADLs, continue interferon injections every Wednesday (next dose 10/17/18)  3)HTN--stable, continue labetalol 100 mg bid and change amlodipine to 10 mg daily,   may use IV Hydralazine 10 mg  Every 4 hours Prn for systolic blood pressure over 160 mmhg  4)Dementia--stable, with cognitive deficits continue Aricept  5)History of Trigeminal Neuralgia--- complains of dental pain, unable to complete dental x-rays due to inability to sit upright, continue Lyrica  and Tegretol  6) chronic microcytic anemia--- B12 levels are normal monitor closely and transfuse as clinically indicated  Disposition/Need for in-Hospital Stay- patient unable to be discharged at this time due to right ankle osteomyelitis requiring surgical intervention (Rt BKA pending)*  Code Status : Full  Family Communication:   son   Disposition Plan  : TBD  Consults  :  Ortho ([facility]Da)  DVT Prophylaxis  :  Lovenox    Lab Results  Component Value Date   PLT 219 10/16/2018    Inpatient Medications  Scheduled Meds: . amLODipine  10 mg Oral Daily  . atorvastatin  40 mg Oral Daily  . baclofen  10 mg Oral TID  . buPROPion  300 mg Oral Daily  . carbamazepine  300 mg Oral Q breakfast   And  . carbamazepine  200 mg Oral BID  . donepezil  5 mg Oral Daily  . enoxaparin (LOVENOX) injection  40 mg Subcutaneous Daily  . fesoterodine  4 mg Oral Daily  . [START ON 10/17/2018] Interferon Beta-1a  30 mcg Intramuscular Weekly  . labetalol  100 mg Oral BID  . liver oil-zinc oxide   Topical QID  . potassium chloride  40 mEq Oral Once  . pregabalin  150 mg Oral BID  . sertraline  75 mg Oral Daily   Continuous Infusions: . sodium chloride 40 mL/hr at 10/16/18 0629  . cefTRIAXone (ROCEPHIN)  IV 2 g (10/15/18 2257)  .  vancomycin 1,000 mg (10/16/18 0631)   PRN Meds:.acetaminophen **OR** acetaminophen, albuterol, hydrALAZINE, hydrocerin, ondansetron **OR** ondansetron (ZOFRAN) IV, traMADol, white petrolatum    Anti-infectives (From admission, onward)   Start     Dose/Rate Route Frequency Ordered Stop   10/14/18 0600  vancomycin (VANCOCIN) IVPB 1000 mg/200 mL premix     1,000 mg 200 mL/hr over 60 Minutes Intravenous Every 24 hours 10/13/18 0925     10/13/18 0600  vancomycin (VANCOCIN) IVPB 750 mg/150 ml premix  Status:  Discontinued     750 mg 150 mL/hr over 60 Minutes Intravenous Every 24 hours 10/12/18 0513 10/13/18 0925   10/12/18 2200  cefTRIAXone (ROCEPHIN) 2 g in sodium chloride  0.9 % 100 mL IVPB     2 g 200 mL/hr over 30 Minutes Intravenous Every 24 hours 10/12/18 0506     10/11/18 2330  vancomycin (VANCOCIN) 1,250 mg in sodium chloride 0.9 % 250 mL IVPB     1,250 mg 166.7 mL/hr over 90 Minutes Intravenous  Once 10/11/18 2305 10/12/18 0252   10/11/18 2315  cefTRIAXone (ROCEPHIN) 2 g in sodium chloride 0.9 % 100 mL IVPB     2 g 200 mL/hr over 30 Minutes Intravenous  Once 10/11/18 2305 10/12/18 0015        Objective:   Vitals:   10/15/18 2017 10/16/18 0554 10/16/18 1000 10/16/18 1431  BP: (!) 152/69 (!) 137/103 (!) 143/69 (!) 144/81  Pulse: 86 89 82 87  Resp: 18   16  Temp: 98.7 F (37.1 C) 99.5 F (37.5 C)  99.2 F (37.3 C)  TempSrc: Oral Axillary  Oral  SpO2: 100% 97%  96%  Weight:      Height:        Wt Readings from Last 3 Encounters:  10/13/18 86.2 kg  05/23/12 58.1 kg  09/23/11 67.2 kg    Intake/Output Summary (Last 24 hours) at 10/16/2018 1651 Last data filed at 10/16/2018 1300 Gross per 24 hour  Intake 943.69 ml  Output 700 ml  Net 243.69 ml    Physical Exam Patient is examined daily including today on 10/16/18 , exams remain the same as of yesterday except that has changed   Gen:- Awake Alert,   HEENT:- [place].AT, No sclera icterus Neck-Supple Neck,No JVD,.  Lungs-  CTAB , fair symmetrical air movement CV- S1, S2 normal, regular  Abd-  +ve B.Sounds, Abd Soft, No tenderness,    Extremity:-, pedal pulses present  Psych-affect is appropriate, oriented x 2, intermittent confusion and disorientation Neuro-baseline neuromuscular deficits consistent with multiple sclerosis mostly affecting the lower extremities MSK--right lateral malleolus with open ulcer/wound  Data Review:   Micro Results Recent Results (from the past 240 hour(s))  Culture, blood (Routine x 2)     Status: None   Collection Time: 10/11/18  3:24 PM  Result Value Ref Range Status   Specimen Description BLOOD LEFT WRIST  Final   Special Requests   Final    BOTTLES DRAWN  AEROBIC AND ANAEROBIC Blood Culture adequate volume   Culture   Final    NO GROWTH 5 DAYS Performed at [facility], [place] [address] [place],  [address]    Report Status 10/16/2018 FINAL  Final  Culture, blood (Routine x 2)     Status: None   Collection Time: 10/11/18  3:39 PM  Result Value Ref Range Status   Specimen Description BLOOD LEFT HAND  Final   Special Requests   Final    BOTTLES DRAWN  AEROBIC ONLY Blood Culture results may not be optimal due to an inadequate volume of blood received in culture bottles   Culture   Final    NO GROWTH 5 DAYS Performed at [facility], [place] [address], [place], [place] [address]    Report Status 10/16/2018 FINAL  Final    Radiology Reports Dg Tibia/fibula Right  Result Date: 10/11/2018 CLINICAL DATA:  Ankle wound. Struck foot on wooden door 2 weeks ago. Redness around wound spreading up leg. EXAM: RIGHT TIBIA AND FIBULA - 2 VIEW COMPARISON:  Concurrent ankle in foot radiographs. FINDINGS: Soft tissue defect about the lateral ankle, better assessed on concurrent ankle radiographs. Diffuse soft tissue edema of the lower leg. No fracture, dislocation, or bony destructive change. Degenerative change of the knee is partially included. IMPRESSION: 1. Soft tissue defect/wound about the lateral ankle, better assessed on concurrent ankle radiographs. Diffuse soft tissue edema of the lower leg. 2. No acute osseous abnormality. Electronically Signed   By: Keith Rake M.D.   On: 10/11/2018 23:06   Dg Ankle Complete Right  Result Date: 10/11/2018 CLINICAL DATA:  Ankle wound. Struck foot on wooden door 2 weeks ago. Redness around wound spreading up leg. EXAM: RIGHT ANKLE - COMPLETE 3+ VIEW COMPARISON:  None. FINDINGS: Soft tissue defect about the lateral ankle. No tracking soft tissue air. Diffuse soft tissue edema about the ankle. No bony destructive change, periosteal reaction, fracture or dislocation. The ankle mortise is preserved.  IMPRESSION: Soft tissue defect about the lateral ankle with diffuse soft tissue edema. No tracking soft tissue air. No radiographic findings of osteomyelitis or acute osseous abnormality. Electronically Signed   By: Keith Rake M.D.   On: 10/11/2018 23:07   Mr Ankle Right W Wo Contrast  Result Date: 10/12/2018 CLINICAL DATA:  Nonhealing lateral ankle wound following injury 2 weeks ago. Increasing lower extremity erythema. EXAM: MRI OF THE RIGHT ANKLE WITHOUT AND WITH CONTRAST TECHNIQUE: Multiplanar, multisequence MR imaging of the ankle was performed before and after the administration of intravenous contrast. CONTRAST:  8 cc Gadavist COMPARISON:  Radiographs 10/11/2018. FINDINGS: TENDONS Peroneal: Intact and normally positioned. No significant tenosynovitis or abnormal enhancement. Posteromedial: Intact and normally positioned. Anterior: Intact and normally positioned. Achilles: Intact. Plantar Fascia: Intact. LIGAMENTS Lateral: The anterior and posterior talofibular and calcaneofibular ligaments are intact. Medial: The deltoid and visualized portions of the spring ligament appear intact. CARTILAGE AND BONES Ankle Joint: No significant ankle joint effusion or abnormal synovial enhancement. Mild tibiotalar degenerative changes. Subtalar Joints/Sinus Tarsi: Unremarkable. Bones: There is a large skin defect lateral to the lateral malleolus with an underlying large area of increased T2 signal and absent enhancement in the subjacent soft tissues. The area of absent enhancement measures 2.8 x 2.4 x 1.1 cm and abuts the lateral malleolus. There is an underlying small subperiosteal abscess measuring up to 17 mm on coronal image 19/5. This is associated with cortical destruction of the distal fibula laterally, underlying marrow edema and enhancement. No other suspicious marrow lesions. Other: In addition to the focal ulcer lateral to the lateral malleolus, there is generalized subcutaneous edema in the lower leg,  extending into the dorsum of the foot. No other focal fluid collections are identified. IMPRESSION: IMPRESSION 1. Large soft tissue ulcer lateral to the lateral malleolus with underlying soft tissue necrosis and a small subperiosteal abscess of the distal fibula. 2. Mild osteomyelitis of the lateral malleolus. No evidence of septic joint. 3. Generalized soft tissue edema consistent with cellulitis. No  focal fluid collections. 4. Intact ankle tendons and ligaments. 5. Preliminary results of this case were discussed between Dr. Radene Knee and Dr. Tamala Julian at 0416 hours on 10/12/2018. Electronically Signed   By: Richardean Sale M.D.   On: 10/12/2018 07:49   Dg Foot Complete Right  Result Date: 10/11/2018 CLINICAL DATA:  Ankle wound. Struck foot on wooden door 2 weeks ago. Redness around wound spreading up leg. EXAM: RIGHT FOOT COMPLETE - 3+ VIEW COMPARISON:  Concurrent ankle radiographs. FINDINGS: Soft tissue defect about the lateral ankle better assessed on concurrent ankle exam. Diffuse soft tissue edema about the foot. No fracture, dislocation, or bony destructive change. Scattered osteoarthritis in the midfoot and about the digits. IMPRESSION: Soft tissue defect about the lateral ankle better assessed on concurrent ankle radiographs. Diffuse soft tissue edema of the foot. No acute osseous abnormality. Electronically Signed   By: Keith Rake M.D.   On: 10/11/2018 23:08     CBC Recent Labs  Lab 10/11/18 1504 10/12/18 0400 10/12/18 0726 10/14/18 0509 10/16/18 0414  WBC 7.3 5.5  --  4.0 6.2  HGB 11.2* 10.3*  --  9.8* 9.9*  HCT 37.1 31.1* 29.6* 29.9* 31.1*  PLT 218 226  --  176 219  MCV 100.8* 96.0  --  93.1 95.1  MCH 30.4 31.8  --  30.5 30.3  MCHC 30.2 33.1  --  32.8 31.8  RDW 12.8 12.9  --  12.5 13.0  LYMPHSABS 1.4  --   --   --   --   MONOABS 1.2*  --   --   --   --   EOSABS 0.0  --   --   --   --   BASOSABS 0.0  --   --   --   --     Chemistries  Recent Labs  Lab 10/11/18 1504  10/12/18 0400 10/14/18 0509 10/15/18 0154 10/16/18 0414  NA 141 141 139 139 140  K 4.5 3.6 2.7* 3.3* 3.9  CL 109 107 105 107 107  CO2 23 25 25 22 23   GLUCOSE 101* 101* 88 97 94  BUN 15 17 <5* 5* 7*  CREATININE 0.76 0.82 0.65 0.70 0.72  CALCIUM 9.1 8.8* 8.2* 8.6* 8.7*  AST 27  --   --   --   --   ALT 23  --   --   --   --   ALKPHOS 72  --   --   --   --   BILITOT 0.2*  --   --   --   --    ------------------------------------------------------------------------------- No results found for: BNP   Roxan Hockey M.D on 10/16/2018 at 4:51 PM  Pager---[phone] Go to [email or web address] - [email or web address] for contact info  [facility] - Office  [phone]

## 2018-10-17 ENCOUNTER — Encounter (HOSPITAL_COMMUNITY): Payer: Self-pay

## 2018-10-17 ENCOUNTER — Encounter (HOSPITAL_COMMUNITY)
Admission: EM | Disposition: A | Discharge status: Skilled Nursing Facility | Payer: Self-pay | Source: Home / Self Care | Attending: Internal Medicine

## 2018-10-17 ENCOUNTER — Inpatient Hospital Stay (HOSPITAL_COMMUNITY): Payer: Medicare HMO | Admitting: Anesthesiology

## 2018-10-17 DIAGNOSIS — Z7401 Bed confinement status: Secondary | ICD-10-CM

## 2018-10-17 DIAGNOSIS — L03115 Cellulitis of right lower limb: Secondary | ICD-10-CM

## 2018-10-17 DIAGNOSIS — K0889 Other specified disorders of teeth and supporting structures: Secondary | ICD-10-CM

## 2018-10-17 HISTORY — PX: AMPUTATION: SHX166

## 2018-10-17 LAB — SURGICAL PCR SCREEN
MRSA, PCR: NEGATIVE
Staphylococcus aureus: POSITIVE — AB

## 2018-10-17 SURGERY — AMPUTATION BELOW KNEE
Anesthesia: General | Laterality: Right

## 2018-10-17 MED ORDER — ROPIVACAINE HCL 5 MG/ML IJ SOLN
INTRAMUSCULAR | Status: DC | PRN
  Administered 2018-10-17: 25 mL via PERINEURAL

## 2018-10-17 MED ORDER — ONDANSETRON HCL 4 MG PO TABS: 4.0000 mg | ORAL_TABLET | Freq: Four times a day (QID) | ORAL | Status: DC | PRN

## 2018-10-17 MED ORDER — MIDAZOLAM HCL 2 MG/2ML IJ SOLN
INTRAMUSCULAR | Status: AC
  Filled 2018-10-17: qty 2

## 2018-10-17 MED ORDER — DEXAMETHASONE SODIUM PHOSPHATE 10 MG/ML IJ SOLN
INTRAMUSCULAR | Status: DC | PRN
  Administered 2018-10-17: 4 mg via INTRAVENOUS

## 2018-10-17 MED ORDER — METHOCARBAMOL 1000 MG/10ML IJ SOLN
500.0000 mg | Freq: Four times a day (QID) | INTRAVENOUS | Status: DC | PRN
  Filled 2018-10-17: qty 5

## 2018-10-17 MED ORDER — PROPOFOL 10 MG/ML IV BOLUS
INTRAVENOUS | Status: DC | PRN
  Administered 2018-10-17: 150 mg via INTRAVENOUS
  Administered 2018-10-17: 50 mg via INTRAVENOUS

## 2018-10-17 MED ORDER — DEXAMETHASONE SODIUM PHOSPHATE 10 MG/ML IJ SOLN
INTRAMUSCULAR | Status: AC
  Filled 2018-10-17: qty 1

## 2018-10-17 MED ORDER — LIDOCAINE 2% (20 MG/ML) 5 ML SYRINGE
INTRAMUSCULAR | Status: AC
  Filled 2018-10-17: qty 5

## 2018-10-17 MED ORDER — FENTANYL CITRATE (PF) 100 MCG/2ML IJ SOLN
INTRAMUSCULAR | Status: AC
  Administered 2018-10-17: 50 ug via INTRAVENOUS
  Filled 2018-10-17: qty 2

## 2018-10-17 MED ORDER — CHLORHEXIDINE GLUCONATE 4 % EX LIQD: 60.0000 mL | Freq: Once | CUTANEOUS | Status: DC

## 2018-10-17 MED ORDER — LABETALOL HCL 5 MG/ML IV SOLN
INTRAVENOUS | Status: AC
  Filled 2018-10-17: qty 4

## 2018-10-17 MED ORDER — PROPOFOL 10 MG/ML IV BOLUS
INTRAVENOUS | Status: AC
  Filled 2018-10-17: qty 20

## 2018-10-17 MED ORDER — OXYCODONE HCL 5 MG PO TABS
10.0000 mg | ORAL_TABLET | ORAL | Status: DC | PRN
  Filled 2018-10-17: qty 2

## 2018-10-17 MED ORDER — SODIUM CHLORIDE 0.9 % IV SOLN
INTRAVENOUS | Status: DC
  Administered 2018-10-20: 11:00:00 via INTRAVENOUS

## 2018-10-17 MED ORDER — ACETAMINOPHEN 325 MG PO TABS
325.0000 mg | ORAL_TABLET | Freq: Four times a day (QID) | ORAL | Status: DC | PRN
  Administered 2018-10-19 (×2): 650 mg via ORAL
  Filled 2018-10-17 (×4): qty 2

## 2018-10-17 MED ORDER — HYDROMORPHONE HCL 1 MG/ML IJ SOLN
0.5000 mg | INTRAMUSCULAR | Status: DC | PRN
  Administered 2018-10-18 – 2018-10-21 (×7): 1 mg via INTRAVENOUS
  Filled 2018-10-17 (×7): qty 1

## 2018-10-17 MED ORDER — POLYETHYLENE GLYCOL 3350 17 G PO PACK: 17.0000 g | PACK | Freq: Every day | ORAL | Status: DC | PRN

## 2018-10-17 MED ORDER — CEFAZOLIN SODIUM-DEXTROSE 2-4 GM/100ML-% IV SOLN
INTRAVENOUS | Status: AC
  Filled 2018-10-17: qty 100

## 2018-10-17 MED ORDER — OXYCODONE HCL 5 MG PO TABS
5.0000 mg | ORAL_TABLET | ORAL | Status: DC | PRN
  Administered 2018-10-17 – 2018-10-18 (×2): 10 mg via ORAL
  Administered 2018-10-19: 5 mg via ORAL
  Administered 2018-10-19 – 2018-10-23 (×4): 10 mg via ORAL
  Filled 2018-10-17: qty 1
  Filled 2018-10-17 (×5): qty 2

## 2018-10-17 MED ORDER — LACTATED RINGERS IV SOLN
INTRAVENOUS | Status: DC
  Administered 2018-10-17: 14:00:00 via INTRAVENOUS

## 2018-10-17 MED ORDER — LIDOCAINE-EPINEPHRINE (PF) 1.5 %-1:200000 IJ SOLN
INTRAMUSCULAR | Status: DC | PRN
  Administered 2018-10-17: 10 mL via PERINEURAL

## 2018-10-17 MED ORDER — DOCUSATE SODIUM 100 MG PO CAPS
100.0000 mg | ORAL_CAPSULE | Freq: Two times a day (BID) | ORAL | Status: DC
  Administered 2018-10-17 – 2018-10-25 (×16): 100 mg via ORAL
  Filled 2018-10-17 (×16): qty 1

## 2018-10-17 MED ORDER — LIDOCAINE 2% (20 MG/ML) 5 ML SYRINGE
INTRAMUSCULAR | Status: DC | PRN
  Administered 2018-10-17: 100 mg via INTRAVENOUS

## 2018-10-17 MED ORDER — METOCLOPRAMIDE HCL 5 MG/ML IJ SOLN: 5.0000 mg | Freq: Three times a day (TID) | INTRAMUSCULAR | Status: DC | PRN

## 2018-10-17 MED ORDER — MUPIROCIN 2 % EX OINT
1.0000 "application " | TOPICAL_OINTMENT | Freq: Two times a day (BID) | CUTANEOUS | Status: DC
  Administered 2018-10-17: 1 via TOPICAL
  Filled 2018-10-17: qty 22

## 2018-10-17 MED ORDER — FENTANYL CITRATE (PF) 100 MCG/2ML IJ SOLN
50.0000 ug | Freq: Once | INTRAMUSCULAR | Status: AC
  Administered 2018-10-17: 50 ug via INTRAVENOUS

## 2018-10-17 MED ORDER — MAGNESIUM CITRATE PO SOLN: 1.0000 | Freq: Once | ORAL | Status: DC | PRN

## 2018-10-17 MED ORDER — ONDANSETRON HCL 4 MG/2ML IJ SOLN
INTRAMUSCULAR | Status: AC
  Filled 2018-10-17: qty 2

## 2018-10-17 MED ORDER — METOCLOPRAMIDE HCL 5 MG PO TABS: 5.0000 mg | ORAL_TABLET | Freq: Three times a day (TID) | ORAL | Status: DC | PRN

## 2018-10-17 MED ORDER — 0.9 % SODIUM CHLORIDE (POUR BTL) OPTIME
TOPICAL | Status: DC | PRN
  Administered 2018-10-17: 1000 mL

## 2018-10-17 MED ORDER — FENTANYL CITRATE (PF) 250 MCG/5ML IJ SOLN
INTRAMUSCULAR | Status: AC
  Filled 2018-10-17: qty 5

## 2018-10-17 MED ORDER — CEFAZOLIN SODIUM-DEXTROSE 2-4 GM/100ML-% IV SOLN
2.0000 g | INTRAVENOUS | Status: AC
  Administered 2018-10-17: 2 g via INTRAVENOUS

## 2018-10-17 MED ORDER — ONDANSETRON HCL 4 MG/2ML IJ SOLN: 4.0000 mg | Freq: Four times a day (QID) | INTRAMUSCULAR | Status: DC | PRN

## 2018-10-17 MED ORDER — ONDANSETRON HCL 4 MG/2ML IJ SOLN
INTRAMUSCULAR | Status: DC | PRN
  Administered 2018-10-17: 4 mg via INTRAVENOUS

## 2018-10-17 MED ORDER — METHOCARBAMOL 500 MG PO TABS
500.0000 mg | ORAL_TABLET | Freq: Four times a day (QID) | ORAL | Status: DC | PRN
  Administered 2018-10-17 – 2018-10-23 (×3): 500 mg via ORAL
  Filled 2018-10-17 (×3): qty 1

## 2018-10-17 MED ORDER — BISACODYL 10 MG RE SUPP: 10.0000 mg | Freq: Every day | RECTAL | Status: DC | PRN

## 2018-10-17 SURGICAL SUPPLY — 37 items
APL SKNCLS STERI-STRIP NONHPOA (GAUZE/BANDAGES/DRESSINGS) ×1
BENZOIN TINCTURE PRP APPL 2/3 (GAUZE/BANDAGES/DRESSINGS) ×5 IMPLANT
BLADE SAW RECIP 87.9 MT (BLADE) ×1 IMPLANT
BLADE SURG 21 STRL SS (BLADE) ×2 IMPLANT
BNDG COHESIVE 6X5 TAN STRL LF (GAUZE/BANDAGES/DRESSINGS) ×3 IMPLANT
BNDG GAUZE ELAST 4 BULKY (GAUZE/BANDAGES/DRESSINGS) ×3 IMPLANT
COVER SURGICAL LIGHT HANDLE (MISCELLANEOUS) ×2 IMPLANT
COVER WAND RF STERILE (DRAPES) ×2 IMPLANT
CUFF TOURNIQUET SINGLE 34IN LL (TOURNIQUET CUFF) IMPLANT
CUFF TOURNIQUET SINGLE 44IN (TOURNIQUET CUFF) IMPLANT
DRAPE INCISE IOBAN 66X45 STRL (DRAPES) IMPLANT
DRAPE U-SHAPE 47X51 STRL (DRAPES) ×2 IMPLANT
DRESSING PREVENA PLUS CUSTOM (GAUZE/BANDAGES/DRESSINGS) ×1 IMPLANT
DRSG PREVENA PLUS CUSTOM (GAUZE/BANDAGES/DRESSINGS) ×2
DURAPREP 26ML APPLICATOR (WOUND CARE) ×2 IMPLANT
ELECT REM PT RETURN 9FT ADLT (ELECTROSURGICAL) ×2
ELECTRODE REM PT RTRN 9FT ADLT (ELECTROSURGICAL) ×1 IMPLANT
GLOVE BIOGEL PI IND STRL 9 (GLOVE) ×1 IMPLANT
GLOVE BIOGEL PI INDICATOR 9 (GLOVE) ×1
GLOVE SURG ORTHO 9.0 STRL STRW (GLOVE) ×2 IMPLANT
GOWN STRL REUS W/ TWL XL LVL3 (GOWN DISPOSABLE) ×2 IMPLANT
GOWN STRL REUS W/TWL XL LVL3 (GOWN DISPOSABLE) ×4
KIT BASIN OR (CUSTOM PROCEDURE TRAY) ×2 IMPLANT
KIT TURNOVER KIT B (KITS) ×2 IMPLANT
MANIFOLD NEPTUNE II (INSTRUMENTS) ×2 IMPLANT
NS IRRIG 1000ML POUR BTL (IV SOLUTION) ×2 IMPLANT
PACK ORTHO EXTREMITY (CUSTOM PROCEDURE TRAY) ×2 IMPLANT
PAD ARMBOARD 7.5X6 YLW CONV (MISCELLANEOUS) ×1 IMPLANT
SPONGE LAP 18X18 X RAY DECT (DISPOSABLE) IMPLANT
STAPLER VISISTAT 35W (STAPLE) IMPLANT
STOCKINETTE IMPERVIOUS LG (DRAPES) ×2 IMPLANT
SUT SILK 2 0 (SUTURE) ×2
SUT SILK 2-0 18XBRD TIE 12 (SUTURE) ×1 IMPLANT
SUT VIC AB 1 CTX 27 (SUTURE) IMPLANT
TOWEL OR 17X26 10 PK STRL BLUE (TOWEL DISPOSABLE) ×2 IMPLANT
TUBE CONNECTING 12X1/4 (SUCTIONS) ×2 IMPLANT
YANKAUER SUCT BULB TIP NO VENT (SUCTIONS) ×2 IMPLANT

## 2018-10-17 NOTE — Progress Notes (Signed)
PROGRESS NOTE    [name]  OHY:[id number] DOB: Aug 26, 1946 DOA: 10/11/2018 PCP: [name], MD   Brief Narrative:  HPI on 10/12/2018 by Dr. Fuller Plan [name] is a 73 y.o. female with medical history significant of severe MS, HTN, trigeminal neuralgia, gait disturbance, dementia, DVT; who presents with complaints of nonhealing right ankle wound.  Which of history comes from the patient's son is her primary caregiver.  At baseline patient is fairly debilitated needs assistance with transfers.  2 weeks ago she hit her right foot on a wooden door frame while trying to transfer and sustained a cut.  Son notes that they dressed the wound with a bandage.  They initially thought that the wound was healing up until last week when they noticed foul smelling drainage coming from the wound.  Since that time he has noted that the wound was not healing and it has turned black.  Trying to get her into her primary care provider, but they were all booked up.  The son noted that she was intermittently more confused than normal and questioned if it was related to the wound.  Denies any reports of fever, cough, shortness of breath, chest pain, nausea, vomiting, diarrhea, or dysuria symptoms.  Patient denies any complaints currently at this time.  Interim history Patient admitted with right ankle and foot ulcer and osteomyelitis, now s/p for BKA today by Dr. [name]. Assessment & Plan   Right ankle wound/osteomyelitis -Patient placed on vancomycin and Rocephin, failed conservative measures -Dr. [name], orthopedic surgery, consulted and appreciated, status post right BKA today -Continue pain control  Multiple sclerosis -With baseline bilateral lower extremity paralysis -Continue baclofen, interferon injections every Wednesday, next dose is today -At baseline, patient has significant difficulties with mobility related to ADLs  Essential hypertension -Continue labetalol, amlodipine, IV hydralazine  as needed  Dementia -Continue Aricept  History of trigeminal neuralgia -Continues to complain of facial pain and dental pain -Continue Lyrica and Tegretol  Chronic microcytic anemia -Hemoglobin appears to be stable, continue to monitor closely  DVT Prophylaxis  Lovenox  Code Status: Full  Family Communication: None at bedside  Disposition Plan: Admitted. S/p R BKA today. Dispo TBD  Consultants Orthopedic surgery, Dr. [name]  Procedures  S/p R BKA  Antibiotics   Anti-infectives (From admission, onward)   Start     Dose/Rate Route Frequency Ordered Stop   10/18/18 0600  ceFAZolin (ANCEF) IVPB 2g/100 mL premix     2 g 200 mL/hr over 30 Minutes Intravenous On call to O.R. 10/17/18 1357 10/17/18 1559   10/17/18 1402  ceFAZolin (ANCEF) 2-4 GM/100ML-% IVPB    Note to Pharmacy:  [facility]   : cabinet override      10/17/18 1402 10/17/18 1559   10/14/18 0600  [MAR Hold]  vancomycin (VANCOCIN) IVPB 1000 mg/200 mL premix     (MAR Hold since Wed 10/17/2018 at 1354. Reason: Transfer to a Procedural area.)   1,000 mg 200 mL/hr over 60 Minutes Intravenous Every 24 hours 10/13/18 0925     10/13/18 0600  vancomycin (VANCOCIN) IVPB 750 mg/150 ml premix  Status:  Discontinued     750 mg 150 mL/hr over 60 Minutes Intravenous Every 24 hours 10/12/18 0513 10/13/18 0925   10/12/18 2200  [MAR Hold]  cefTRIAXone (ROCEPHIN) 2 g in sodium chloride 0.9 % 100 mL IVPB     (MAR Hold since Wed 10/17/2018 at 1354. Reason: Transfer to a Procedural area.)   2 g 200 mL/hr  over 30 Minutes Intravenous Every 24 hours 10/12/18 0506     10/11/18 2330  vancomycin (VANCOCIN) 1,250 mg in sodium chloride 0.9 % 250 mL IVPB     1,250 mg 166.7 mL/hr over 90 Minutes Intravenous  Once 10/11/18 2305 10/12/18 0252   10/11/18 2315  cefTRIAXone (ROCEPHIN) 2 g in sodium chloride 0.9 % 100 mL IVPB     2 g 200 mL/hr over 30 Minutes Intravenous  Once 10/11/18 2305 10/12/18 0015      Subjective:   [name] seen  and examined today.  Complains of right-sided facial/tooth pain.  Denies current chest pain, shortness of breath, abdominal pain, nausea or vomiting, diarrhea or constipation.  Objective:   Vitals:   10/17/18 1359 10/17/18 1505 10/17/18 1518 10/17/18 1633  BP:  (!) 138/45 (!) 169/55 (!) 150/69  Pulse:  84 83 93  Resp:  19 (!) 28 (!) 21  Temp:    97.7 F (36.5 C)  TempSrc:      SpO2:  100% 100% 96%  Weight: 86.2 kg     Height: 5\' 3"  (1.6 m)       Intake/Output Summary (Last 24 hours) at 10/17/2018 1646 Last data filed at 10/17/2018 1617 Gross per 24 hour  Intake 1427.05 ml  Output 1020 ml  Net 407.05 ml   Filed Weights   10/13/18 0354 10/17/18 1359  Weight: 86.2 kg 86.2 kg    Exam  General: Well developed, well nourished, NAD, appears stated age  HEENT: NCAT, mucous membranes moist.   Neck: Supple  Cardiovascular: S1 S2 auscultated, RRR, no murmur  Respiratory: Clear to auscultation bilaterally with equal chest rise  Abdomen: Soft, nontender, nondistended, + bowel sounds  Extremities: warm dry without cyanosis clubbing or edema. R lateral malleolus with open ulcer/wound (odor)  Neuro: AAOx2, nonfocal, lower ext neuromuscular deficits consistent with MS  Psych: Normal affect and demeanor with intact judgement and insight   Data Reviewed: I have personally reviewed following labs and imaging studies  CBC: Recent Labs  Lab 10/11/18 1504 10/12/18 0400 10/12/18 0726 10/14/18 0509 10/16/18 0414  WBC 7.3 5.5  --  4.0 6.2  NEUTROABS 4.5  --   --   --   --   HGB 11.2* 10.3*  --  9.8* 9.9*  HCT 37.1 31.1* 29.6* 29.9* 31.1*  MCV 100.8* 96.0  --  93.1 95.1  PLT 218 226  --  176 220   Basic Metabolic Panel: Recent Labs  Lab 10/11/18 1504 10/12/18 0400 10/14/18 0509 10/15/18 0154 10/16/18 0414  NA 141 141 139 139 140  K 4.5 3.6 2.7* 3.3* 3.9  CL 109 107 105 107 107  CO2 23 25 25 22 23   GLUCOSE 101* 101* 88 97 94  BUN 15 17 <5* 5* 7*  CREATININE 0.76 0.82  0.65 0.70 0.72  CALCIUM 9.1 8.8* 8.2* 8.6* 8.7*   GFR: Estimated Creatinine Clearance: 68.1 mL/min (by C-G formula based on SCr of 0.72 mg/dL). Liver Function Tests: Recent Labs  Lab 10/11/18 1504  AST 27  ALT 23  ALKPHOS 72  BILITOT 0.2*  PROT 7.3  ALBUMIN 3.1*   No results for input(s): LIPASE, AMYLASE in the last 168 hours. No results for input(s): AMMONIA in the last 168 hours. Coagulation Profile: No results for input(s): INR, PROTIME in the last 168 hours. Cardiac Enzymes: No results for input(s): CKTOTAL, CKMB, CKMBINDEX, TROPONINI in the last 168 hours. BNP (last 3 results) No results for input(s): PROBNP in the last  8760 hours. HbA1C: No results for input(s): HGBA1C in the last 72 hours. CBG: No results for input(s): GLUCAP in the last 168 hours. Lipid Profile: No results for input(s): CHOL, HDL, LDLCALC, TRIG, CHOLHDL, LDLDIRECT in the last 72 hours. Thyroid Function Tests: No results for input(s): TSH, T4TOTAL, FREET4, T3FREE, THYROIDAB in the last 72 hours. Anemia Panel: No results for input(s): VITAMINB12, FOLATE, FERRITIN, TIBC, IRON, RETICCTPCT in the last 72 hours. Urine analysis:    Component Value Date/Time   COLORURINE ORANGE (A) 09/28/2011 1846   APPEARANCEUR Cloudy (A) 02/04/2014 1313   LABSPEC 1.036 (H) 09/28/2011 1846   PHURINE 6.5 09/28/2011 1846   GLUCOSEU Negative 02/04/2014 1313   HGBUR MODERATE (A) 09/28/2011 1846   BILIRUBINUR Negative 02/04/2014 1313   KETONESUR 15 (A) 09/28/2011 1846   PROTEINUR Negative 02/04/2014 1313   PROTEINUR 100 (A) 09/28/2011 1846   UROBILINOGEN 2.0 (H) 09/28/2011 1846   NITRITE Negative 02/04/2014 1313   NITRITE POSITIVE (A) 09/28/2011 1846   LEUKOCYTESUR Negative 02/04/2014 1313   Sepsis Labs: @LABRCNTIP (procalcitonin:4,lacticidven:4)  ) Recent Results (from the past 240 hour(s))  Culture, blood (Routine x 2)     Status: None   Collection Time: 10/11/18  3:24 PM  Result Value Ref Range Status    Specimen Description BLOOD LEFT WRIST  Final   Special Requests   Final    BOTTLES DRAWN AEROBIC AND ANAEROBIC Blood Culture adequate volume   Culture   Final    NO GROWTH 5 DAYS Performed at [facility], [place] [address] [place], [place] [address]    Report Status 10/16/2018 FINAL  Final  Culture, blood (Routine x 2)     Status: None   Collection Time: 10/11/18  3:39 PM  Result Value Ref Range Status   Specimen Description BLOOD LEFT HAND  Final   Special Requests   Final    BOTTLES DRAWN AEROBIC ONLY Blood Culture results may not be optimal due to an inadequate volume of blood received in culture bottles   Culture   Final    NO GROWTH 5 DAYS Performed at [facility], [place] [address] [place], [place] [address]    Report Status 10/16/2018 FINAL  Final  Surgical PCR screen     Status: Abnormal   Collection Time: 10/17/18  5:12 AM  Result Value Ref Range Status   MRSA, PCR NEGATIVE NEGATIVE Final   Staphylococcus aureus POSITIVE (A) NEGATIVE Final    Comment: (NOTE) The Xpert SA Assay (FDA approved for NASAL specimens in patients 45 years of age and older), is one component of a comprehensive surveillance program. It is not intended to diagnose infection nor to guide or monitor treatment. Performed at [facility], [place] [address], [place], [place] [address]       Radiology Studies: No results found.   Scheduled Meds: . [MAR Hold] amLODipine  10 mg Oral Daily  . [MAR Hold] atorvastatin  40 mg Oral Daily  . [MAR Hold] baclofen  10 mg Oral TID  . [MAR Hold] buPROPion  300 mg Oral Daily  . [MAR Hold] carbamazepine  300 mg Oral Q breakfast   And  . [MAR Hold] carbamazepine  200 mg Oral BID  . chlorhexidine  60 mL Topical Once  . [MAR Hold] donepezil  5 mg Oral Daily  . [MAR Hold] enoxaparin (LOVENOX) injection  40 mg Subcutaneous Daily  . [MAR Hold] fesoterodine  4 mg Oral Daily  . [MAR Hold] Interferon Beta-1a  30 mcg Intramuscular Weekly  . [MAR Hold]  labetalol  100 mg Oral BID  . [MAR Hold] liver oil-zinc oxide   Topical QID  . mupirocin ointment  1 application Topical BID  . [MAR Hold] potassium chloride  40 mEq Oral Once  . [MAR Hold] pregabalin  150 mg Oral BID  . [MAR Hold] sertraline  75 mg Oral Daily   Continuous Infusions: . sodium chloride 40 mL/hr at 10/17/18 0830  . [MAR Hold] cefTRIAXone (ROCEPHIN)  IV 2 g (10/16/18 2233)  . lactated ringers 10 mL/hr at 10/17/18 1408  . [MAR Hold] vancomycin 1,000 mg (10/17/18 0515)     LOS: 5 days   Time Spent in minutes   30 minutes  Teondre Jarosz D.O. on 10/17/2018 at 4:46 PM  Between 7am to 7pm - Please see pager noted on [email or web address]  After 7pm go to [email or web address]  And look for the night coverage person covering for me after hours  [facility] Office  [phone]

## 2018-10-17 NOTE — Anesthesia Procedure Notes (Signed)
Procedure Name: LMA Insertion Performed by: Milford Cage, CRNA Pre-anesthesia Checklist: Patient identified, Emergency Drugs available, Suction available and Patient being monitored Patient Re-evaluated:Patient Re-evaluated prior to induction Oxygen Delivery Method: Circle System Utilized Preoxygenation: Pre-oxygenation with 100% oxygen Induction Type: IV induction Ventilation: Mask ventilation without difficulty and Oral airway inserted - appropriate to patient size LMA: LMA inserted LMA Size: 4.0 Number of attempts: 1 Airway Equipment and Method: Bite block Placement Confirmation: positive ETCO2 Tube secured with: Tape Dental Injury: Teeth and Oropharynx as per pre-operative assessment  Comments: Poor neck extension

## 2018-10-17 NOTE — Progress Notes (Signed)
MD at bedside and stated to hold all medications (including MS home med) until after surgery except Tegretol and BP meds.

## 2018-10-17 NOTE — Anesthesia Preprocedure Evaluation (Addendum)
Anesthesia Evaluation  Patient identified by MRN, date of birth, ID band Patient awake    Reviewed: Allergy & Precautions, NPO status , Patient's Chart, lab work & pertinent test results  History of Anesthesia Complications Negative for: history of anesthetic complications  Airway Mallampati: III  TM Distance: >3 FB Neck ROM: Full    Dental  (+) Dental Advisory Given, Teeth Intact   Pulmonary neg pulmonary ROS,    breath sounds clear to auscultation       Cardiovascular hypertension, Pt. on medications and Pt. on home beta blockers + DVT   Rhythm:Regular Rate:Normal     Neuro/Psych PSYCHIATRIC DISORDERS Depression  Trigeminal neuralgia Memory loss    Neuromuscular disease (Multiple Sclerosis - right sided weakness, gait disturbance, bladder/bowel problems)    GI/Hepatic negative GI ROS, Neg liver ROS,   Endo/Other   Obesity   Renal/GU negative Renal ROS Bladder dysfunction      Musculoskeletal negative musculoskeletal ROS (+)   Abdominal   Peds  Hematology  (+) anemia ,   Anesthesia Other Findings   Reproductive/Obstetrics                            Anesthesia Physical Anesthesia Plan  ASA: III  Anesthesia Plan: General   Post-op Pain Management:  Regional for Post-op pain   Induction: Intravenous  PONV Risk Score and Plan: 3 and Treatment may vary due to age or medical condition, Ondansetron and Dexamethasone  Airway Management Planned: LMA  Additional Equipment: None  Intra-op Plan:   Post-operative Plan: Extubation in OR  Informed Consent: I have reviewed the patients History and Physical, chart, labs and discussed the procedure including the risks, benefits and alternatives for the proposed anesthesia with the patient or authorized representative who has indicated his/her understanding and acceptance.   Dental advisory given  Plan Discussed with: CRNA and  Anesthesiologist  Anesthesia Plan Comments:        Anesthesia Quick Evaluation

## 2018-10-17 NOTE — Interval H&P Note (Signed)
History and Physical Interval Note:  10/17/2018 7:02 AM  [name]  has presented today for surgery, with the diagnosis of Osteomyelitis Right Ankle  The various methods of treatment have been discussed with the patient and family. After consideration of risks, benefits and other options for treatment, the patient has consented to  Procedure(s): RIGHT BELOW KNEE AMPUTATION (Right) as a surgical intervention .  The patient's history has been reviewed, patient examined, no change in status, stable for surgery.  I have reviewed the patient's chart and labs.  Questions were answered to the patient's satisfaction.     [name]

## 2018-10-17 NOTE — Anesthesia Procedure Notes (Signed)
Anesthesia Regional Block: Femoral nerve block   Pre-Anesthetic Checklist: ,, timeout performed, Correct Patient, Correct Site, Correct Laterality, Correct Procedure, Correct Position, site marked, Risks and benefits discussed,  Surgical consent,  Pre-op evaluation,  At surgeon's request and post-op pain management  Laterality: Right  Prep: chloraprep       Needles:  Injection technique: Single-shot  Needle Type: Echogenic Needle     Needle Length: 9cm      Additional Needles:   Procedures:,,,, ultrasound used (permanent image in chart),,,,  Narrative:  Start time: 10/17/2018 3:07 PM End time: 10/17/2018 3:15 PM Injection made incrementally with aspirations every 5 mL.  Performed by: Personally  Anesthesiologist: Myrtie Soman, MD  Additional Notes: Patient tolerated the procedure well without complications

## 2018-10-17 NOTE — Plan of Care (Signed)

## 2018-10-17 NOTE — Transfer of Care (Signed)
Immediate Anesthesia Transfer of Care Note  Patient: [name]  Procedure(s) Performed: RIGHT BELOW KNEE AMPUTATION (Right )  Patient Location: PACU  Anesthesia Type:GA combined with regional for post-op pain  Level of Consciousness: confused  Airway & Oxygen Therapy: Patient Spontanous Breathing and Patient connected to nasal cannula oxygen  Post-op Assessment: Report given to RN and Post -op Vital signs reviewed and stable  Post vital signs: Reviewed and stable  Last Vitals:  Vitals Value Taken Time  BP 150/69 10/17/2018  4:33 PM  Temp 36.5 C 10/17/2018  4:33 PM  Pulse 84 10/17/2018  4:36 PM  Resp 17 10/17/2018  4:36 PM  SpO2 97 % 10/17/2018  4:36 PM  Vitals shown include unvalidated device data.  Last Pain:  Vitals:   10/17/18 0810  TempSrc:   PainSc: 10-Worst pain ever      Patients Stated Pain Goal: 3 (81/10/31 5945)  Complications: No apparent anesthesia complications

## 2018-10-17 NOTE — Op Note (Signed)
   Date of Surgery: 10/17/2018  INDICATIONS: [name] is a 73 y.o.-year-old female who has a gangrenous ulcer right ankle with chronic osteomyelitis and cellulitis.  PREOPERATIVE DIAGNOSIS: gangrene and osteomyelitis right ankle  POSTOPERATIVE DIAGNOSIS: Same.  PROCEDURE: Transtibial amputation Application of Prevena wound VAC  SURGEON: [name], M.D.  ANESTHESIA:  general  IV FLUIDS AND URINE: See anesthesia.  ESTIMATED BLOOD LOSS: min mL.  COMPLICATIONS: None.  DESCRIPTION OF PROCEDURE: The patient was brought to the operating room and underwent a general anesthetic. After adequate levels of anesthesia were obtained patient's lower extremity was prepped using DuraPrep draped into a sterile field. A timeout was called. The foot was draped out of the sterile field with impervious stockinette. A transverse incision was made 11 cm distal to the tibial tubercle. This curved proximally and a large posterior flap was created. The tibia was transected 1 cm proximal to the skin incision. The fibula was transected just proximal to the tibial incision. The tibia was beveled anteriorly. A large posterior flap was created. The sciatic nerve was pulled cut and allowed to retract. The vascular bundles were suture ligated with 2-0 silk. The deep and superficial fascial layers were closed using #1 Vicryl. The skin was closed using staples and 2-0 nylon. The wound was covered with a Restor andPrevena customizable wound VAC. There was a good suction fit. A prosthetic shrinker will be  applied. Patient was extubated taken to the PACU in stable condition.   DISCHARGE PLANNING:  Antibiotic duration:24 hours post op  Weightbearing: NA  Pain medication: opoid pathway ordered  Dressing care/ Wound VAC:VAC for 2 weeks  Discharge to: SNF  Follow-up: In the office 1 week post operative.  Meagan Score, MD [place] 4:23 PM

## 2018-10-17 NOTE — Plan of Care (Signed)
  Problem: Nutrition: Goal: Adequate nutrition will be maintained Outcome: Progressing   Problem: Safety: Goal: Ability to remain free from injury will improve Outcome: Progressing   

## 2018-10-17 NOTE — Anesthesia Procedure Notes (Signed)
Anesthesia Procedure Image    

## 2018-10-18 ENCOUNTER — Encounter (HOSPITAL_COMMUNITY): Payer: Self-pay | Admitting: Orthopedic Surgery

## 2018-10-18 LAB — BASIC METABOLIC PANEL
ANION GAP: 8 (ref 5–15)
BUN: 7 mg/dL — ABNORMAL LOW (ref 8–23)
CHLORIDE: 105 mmol/L (ref 98–111)
CO2: 26 mmol/L (ref 22–32)
Calcium: 8.5 mg/dL — ABNORMAL LOW (ref 8.9–10.3)
Creatinine, Ser: 0.68 mg/dL (ref 0.44–1.00)
GFR calc Af Amer: >60 mL/min (ref >60)
GFR calc non Af Amer: >60 mL/min (ref >60)
Glucose, Bld: 102 mg/dL — ABNORMAL HIGH (ref 70–99)
Potassium: 3.3 mmol/L — ABNORMAL LOW (ref 3.5–5.1)
Sodium: 139 mmol/L (ref 135–145)

## 2018-10-18 LAB — CBC
HCT: 28.4 % — ABNORMAL LOW (ref 36.0–46.0)
HEMOGLOBIN: 9.3 g/dL — AB (ref 12.0–15.0)
MCH: 31 pg (ref 26.0–34.0)
MCHC: 32.7 g/dL (ref 30.0–36.0)
MCV: 94.7 fL (ref 80.0–100.0)
Platelets: 244 10*3/uL (ref 150–400)
RBC: 3 MIL/uL — AB (ref 3.87–5.11)
RDW: 13.2 % (ref 11.5–15.5)
WBC: 8.1 10*3/uL (ref 4.0–10.5)
nRBC: 0 % (ref 0.0–0.2)

## 2018-10-18 MED ORDER — POTASSIUM CHLORIDE CRYS ER 20 MEQ PO TBCR
40.0000 meq | EXTENDED_RELEASE_TABLET | Freq: Once | ORAL | Status: AC
  Administered 2018-10-18: 40 meq via ORAL
  Filled 2018-10-18: qty 2

## 2018-10-18 NOTE — Clinical Social Work Note (Signed)
Clinical Social Work Assessment  Patient Details  Name: [name] MRN: [id number] Date of Birth: [birth year]  Date of referral:  10/18/18               Reason for consult:  Discharge Planning, Facility Placement                Permission sought to share information with:  Facility Sport and exercise psychologist, Family Supports Permission granted to share information::  No(pt with baseline confusion)  Name::     [name]  Agency::  SNFs  Relationship::  son  Contact Information:  [phone]  Housing/Transportation Living arrangements for the past 2 months:  Single Family Home Source of Information:  Adult Children Patient Interpreter Needed:  None Criminal Activity/Legal Involvement Pertinent to Current Situation/Hospitalization:  No - Comment as needed Significant Relationships:  Adult Children, Other Family Members Lives with:  Adult Children Do you feel safe going back to the place where you live?  Yes Need for family participation in patient care:  Yes (Comment)  Care giving concerns: Pt from home with her son, at baseline required assistance but was able to transfer and take a few steps with her son. SNF recommended and pt son interested.    Social Worker assessment / plan:  CSW spoke with pt son [name] on telephone. Introduced self, role, and reason for call. Pt son confirmed pt is from home with his assistance, they have some extended family and friends that come to visit the patient but he is the only true caregiver. Pt has been to SNF before, we discussed recommendations and pt son is interested in offers. Pt has previously been to [facility]. Will send referral and f/u with son. PASRR submitted and pending.  Employment status:  Retired Nurse, adult PT Recommendations:  [place], [place] / Referral to community resources:  [place]  Patient/Family's Response to care:  Pt son  amenable to speaking with CSW, he is interested in SNF as a bridge before she returns home.   Patient/Family's Understanding of and Emotional Response to Diagnosis, Current Treatment, and Prognosis:  Pt son states understanding of diagnosis, current treatment and prognosis. Pt son expresses some sadness that pt had to undergo amputation, however he is hopeful for her return home. CSW provided support to son regarding stress and difficulty of managing being a caregiver along with the other things going on in life. Pt son emotionally appropriate and attentive to patient and throughout assessment. Pt son sounds happy with care here at hospital.  Emotional Assessment Appearance:  Appears stated age Attitude/Demeanor/Rapport:  Unable to Assess Affect (typically observed):  Unable to Assess Orientation:  Oriented to Self, Oriented to Place, Fluctuating Orientation (Suspected and/or reported Sundowners) Alcohol / Substance use:  Not Applicable Psych involvement (Current and /or in the community):  No (Comment)  Discharge Needs  Concerns to be addressed:  Care Coordination, Discharge Planning Concerns Readmission within the last 30 days:  Yes Current discharge risk:  Dependent with Mobility, Cognitively Impaired Barriers to Discharge:  Ship broker, Continued Medical Work up   [employer], [place] 10/18/2018, 2:19 PM

## 2018-10-18 NOTE — Social Work (Signed)
PASSR received: [id number] E Valid through 11/17/2018  Westley Hummer, MSW, [place] Work [phone]

## 2018-10-18 NOTE — Social Work (Signed)
CSW acknowledging consult for SNF placement. Will follow for therapy recommendations.  Aware pt is from home with her son who is primary caregiver.   Westley Hummer, MSW, [place] Work [phone]

## 2018-10-18 NOTE — Anesthesia Postprocedure Evaluation (Signed)
Anesthesia Post Note  Patient: [name]  Procedure(s) Performed: RIGHT BELOW KNEE AMPUTATION (Right )     Patient location during evaluation: PACU Anesthesia Type: General Level of consciousness: awake and alert Pain management: pain level controlled Vital Signs Assessment: post-procedure vital signs reviewed and stable Respiratory status: spontaneous breathing, nonlabored ventilation, respiratory function stable and patient connected to nasal cannula oxygen Cardiovascular status: blood pressure returned to baseline and stable Postop Assessment: no apparent nausea or vomiting Anesthetic complications: no    Last Vitals:  Vitals:   10/18/18 0807 10/18/18 1035  BP: (!) 148/63 129/81  Pulse: 94 90  Resp:  18  Temp:  37.3 C  SpO2:  97%    Last Pain:  Vitals:   10/18/18 1035  TempSrc: Oral  PainSc:                  [name] S

## 2018-10-18 NOTE — Progress Notes (Addendum)
PROGRESS NOTE    [name]  XHB:[id number] DOB: Aug 17, 1946 DOA: 10/11/2018 PCP: [name], MD   Brief Narrative:  HPI on 10/12/2018 by Dr. Fuller Plan [name] is a 73 y.o. female with medical history significant of severe MS, HTN, trigeminal neuralgia, gait disturbance, dementia, DVT; who presents with complaints of nonhealing right ankle wound.  Which of history comes from the patient's son is her primary caregiver.  At baseline patient is fairly debilitated needs assistance with transfers.  2 weeks ago she hit her right foot on a wooden door frame while trying to transfer and sustained a cut.  Son notes that they dressed the wound with a bandage.  They initially thought that the wound was healing up until last week when they noticed foul smelling drainage coming from the wound.  Since that time he has noted that the wound was not healing and it has turned black.  Trying to get her into her primary care provider, but they were all booked up.  The son noted that she was intermittently more confused than normal and questioned if it was related to the wound.  Denies any reports of fever, cough, shortness of breath, chest pain, nausea, vomiting, diarrhea, or dysuria symptoms.  Patient denies any complaints currently at this time.  Interim history Patient admitted with right ankle and foot ulcer and osteomyelitis, now s/p for BKA today by Dr. [name]. Assessment & Plan   Right ankle wound/osteomyelitis -Patient placed on vancomycin and Rocephin, failed conservative measures -Dr. [name], orthopedic surgery, consulted and appreciated, status post right BKA  -Continue pain control -Per ortho, may discontinue antibiotics 24hrs post operatively -Pending PT and OT evaluations -Patient may likely need SNF. Not sure what her baseline is at this time. Will reach out to family.  Multiple sclerosis -With baseline bilateral lower extremity paralysis -Continue baclofen, interferon injections  every Wednesday -At baseline, patient has significant difficulties with mobility related to ADLs  Essential hypertension -Continue labetalol, amlodipine, IV hydralazine as needed  Dementia -Continue Aricept  History of trigeminal neuralgia -Continues to complain of facial pain and dental pain -Continue Lyrica and Tegretol  Chronic microcytic anemia -Hemoglobin appears to be stable, continue to monitor closely  Hypokalemia -Will replace and continue to monitor BMP  DVT Prophylaxis  Lovenox  Code Status: Full  Family Communication: None at bedside  Disposition Plan: Admitted. Dispo TBD  Consultants Orthopedic surgery, Dr. [name]  Procedures  Transtibial amputation  Antibiotics   Anti-infectives (From admission, onward)   Start     Dose/Rate Route Frequency Ordered Stop   10/18/18 0600  ceFAZolin (ANCEF) IVPB 2g/100 mL premix     2 g 200 mL/hr over 30 Minutes Intravenous On call to O.R. 10/17/18 1357 10/18/18 0623   10/17/18 1402  ceFAZolin (ANCEF) 2-4 GM/100ML-% IVPB    Note to Pharmacy:  [facility]   : cabinet override      10/17/18 1402 10/17/18 1559   10/14/18 0600  vancomycin (VANCOCIN) IVPB 1000 mg/200 mL premix     1,000 mg 200 mL/hr over 60 Minutes Intravenous Every 24 hours 10/13/18 0925 10/18/18 0654   10/13/18 0600  vancomycin (VANCOCIN) IVPB 750 mg/150 ml premix  Status:  Discontinued     750 mg 150 mL/hr over 60 Minutes Intravenous Every 24 hours 10/12/18 0513 10/13/18 0925   10/12/18 2200  cefTRIAXone (ROCEPHIN) 2 g in sodium chloride 0.9 % 100 mL IVPB     2 g 200 mL/hr over 30 Minutes Intravenous  Every 24 hours 10/12/18 0506 10/18/18 2359   10/11/18 2330  vancomycin (VANCOCIN) 1,250 mg in sodium chloride 0.9 % 250 mL IVPB     1,250 mg 166.7 mL/hr over 90 Minutes Intravenous  Once 10/11/18 2305 10/12/18 0252   10/11/18 2315  cefTRIAXone (ROCEPHIN) 2 g in sodium chloride 0.9 % 100 mL IVPB     2 g 200 mL/hr over 30 Minutes Intravenous  Once  10/11/18 2305 10/12/18 0015      Subjective:   [name] seen and examined today.  Continues to complain of right-sided facial pain.  Currently denies chest pain, shortness of breath, abdominal pain, headache.   Objective:   Vitals:   10/17/18 2127 10/18/18 0300 10/18/18 0528 10/18/18 0807  BP: (!) 150/87 120/64 (!) 167/64 (!) 148/63  Pulse: (!) 102 90 82 94  Resp: 16 16 16    Temp: 99.1 F (37.3 C) 100.1 F (37.8 C) 99.5 F (37.5 C)   TempSrc: Oral Oral Oral   SpO2: 98% 98% 98%   Weight:      Height:        Intake/Output Summary (Last 24 hours) at 10/18/2018 1036 Last data filed at 10/18/2018 1062 Gross per 24 hour  Intake 815.19 ml  Output 620 ml  Net 195.19 ml   Filed Weights   10/13/18 0354 10/17/18 1359  Weight: 86.2 kg 86.2 kg   Exam  General: Well developed, well nourished, NAD, appears stated age  HEENT: NCAT, mucous membranes moist.   Neck: Supple  Cardiovascular: S1 S2 auscultated, RRR, no murmur  Respiratory: Clear to auscultation bilaterally with equal chest rise  Abdomen: Soft, nontender, nondistended, + bowel sounds  Extremities: warm dry without cyanosis clubbing or edema of LLE.  Right BKA with wound VAC in place.  Neuro: AAOx2, lower extremity neuromuscular deficits consistent with multiple sclerosis which are chronic, otherwise nonfocal  Psych: Pleasant, appropriate mood and affect  Data Reviewed: I have personally reviewed following labs and imaging studies  CBC: Recent Labs  Lab 10/11/18 1504 10/12/18 0400 10/12/18 0726 10/14/18 0509 10/16/18 0414 10/18/18 0335  WBC 7.3 5.5  --  4.0 6.2 8.1  NEUTROABS 4.5  --   --   --   --   --   HGB 11.2* 10.3*  --  9.8* 9.9* 9.3*  HCT 37.1 31.1* 29.6* 29.9* 31.1* 28.4*  MCV 100.8* 96.0  --  93.1 95.1 94.7  PLT 218 226  --  176 219 694   Basic Metabolic Panel: Recent Labs  Lab 10/12/18 0400 10/14/18 0509 10/15/18 0154 10/16/18 0414 10/18/18 0335  NA 141 139 139 140 139  K 3.6  2.7* 3.3* 3.9 3.3*  CL 107 105 107 107 105  CO2 25 25 22 23 26   GLUCOSE 101* 88 97 94 102*  BUN 17 <5* 5* 7* 7*  CREATININE 0.82 0.65 0.70 0.72 0.68  CALCIUM 8.8* 8.2* 8.6* 8.7* 8.5*   GFR: Estimated Creatinine Clearance: 68.1 mL/min (by C-G formula based on SCr of 0.68 mg/dL). Liver Function Tests: Recent Labs  Lab 10/11/18 1504  AST 27  ALT 23  ALKPHOS 72  BILITOT 0.2*  PROT 7.3  ALBUMIN 3.1*   No results for input(s): LIPASE, AMYLASE in the last 168 hours. No results for input(s): AMMONIA in the last 168 hours. Coagulation Profile: No results for input(s): INR, PROTIME in the last 168 hours. Cardiac Enzymes: No results for input(s): CKTOTAL, CKMB, CKMBINDEX, TROPONINI in the last 168 hours. BNP (last 3 results) No  results for input(s): PROBNP in the last 8760 hours. HbA1C: No results for input(s): HGBA1C in the last 72 hours. CBG: No results for input(s): GLUCAP in the last 168 hours. Lipid Profile: No results for input(s): CHOL, HDL, LDLCALC, TRIG, CHOLHDL, LDLDIRECT in the last 72 hours. Thyroid Function Tests: No results for input(s): TSH, T4TOTAL, FREET4, T3FREE, THYROIDAB in the last 72 hours. Anemia Panel: No results for input(s): VITAMINB12, FOLATE, FERRITIN, TIBC, IRON, RETICCTPCT in the last 72 hours. Urine analysis:    Component Value Date/Time   COLORURINE ORANGE (A) 09/28/2011 1846   APPEARANCEUR Cloudy (A) 02/04/2014 1313   LABSPEC 1.036 (H) 09/28/2011 1846   PHURINE 6.5 09/28/2011 1846   GLUCOSEU Negative 02/04/2014 1313   HGBUR MODERATE (A) 09/28/2011 1846   BILIRUBINUR Negative 02/04/2014 1313   KETONESUR 15 (A) 09/28/2011 1846   PROTEINUR Negative 02/04/2014 1313   PROTEINUR 100 (A) 09/28/2011 1846   UROBILINOGEN 2.0 (H) 09/28/2011 1846   NITRITE Negative 02/04/2014 1313   NITRITE POSITIVE (A) 09/28/2011 1846   LEUKOCYTESUR Negative 02/04/2014 1313   Sepsis Labs: @LABRCNTIP (procalcitonin:4,lacticidven:4)  ) Recent Results (from the past  240 hour(s))  Culture, blood (Routine x 2)     Status: None   Collection Time: 10/11/18  3:24 PM  Result Value Ref Range Status   Specimen Description BLOOD LEFT WRIST  Final   Special Requests   Final    BOTTLES DRAWN AEROBIC AND ANAEROBIC Blood Culture adequate volume   Culture   Final    NO GROWTH 5 DAYS Performed at [facility], [place] [address], [place], [place] [address]    Report Status 10/16/2018 FINAL  Final  Culture, blood (Routine x 2)     Status: None   Collection Time: 10/11/18  3:39 PM  Result Value Ref Range Status   Specimen Description BLOOD LEFT HAND  Final   Special Requests   Final    BOTTLES DRAWN AEROBIC ONLY Blood Culture results may not be optimal due to an inadequate volume of blood received in culture bottles   Culture   Final    NO GROWTH 5 DAYS Performed at [facility], [place] [address], [place], [place] [address]    Report Status 10/16/2018 FINAL  Final  Surgical PCR screen     Status: Abnormal   Collection Time: 10/17/18  5:12 AM  Result Value Ref Range Status   MRSA, PCR NEGATIVE NEGATIVE Final   Staphylococcus aureus POSITIVE (A) NEGATIVE Final    Comment: (NOTE) The Xpert SA Assay (FDA approved for NASAL specimens in patients 60 years of age and older), is one component of a comprehensive surveillance program. It is not intended to diagnose infection nor to guide or monitor treatment. Performed at [facility], [place] [address], [place],  [address]       Radiology Studies: No results found.   Scheduled Meds: . amLODipine  10 mg Oral Daily  . atorvastatin  40 mg Oral Daily  . baclofen  10 mg Oral TID  . buPROPion  300 mg Oral Daily  . carbamazepine  300 mg Oral Q breakfast   And  . carbamazepine  200 mg Oral BID  . docusate sodium  100 mg Oral BID  . donepezil  5 mg Oral Daily  . enoxaparin (LOVENOX) injection  40 mg Subcutaneous Daily  . fesoterodine  4 mg Oral Daily  . Interferon Beta-1a  30 mcg  Intramuscular Weekly  . labetalol  100 mg  Oral BID  . liver oil-zinc oxide   Topical QID  . potassium chloride  40 mEq Oral Once  . pregabalin  150 mg Oral BID  . sertraline  75 mg Oral Daily   Continuous Infusions: . sodium chloride 40 mL/hr at 10/17/18 0830  . sodium chloride    . cefTRIAXone (ROCEPHIN)  IV Stopped (10/17/18 2202)  . lactated ringers 10 mL/hr at 10/17/18 1408  . methocarbamol (ROBAXIN) IV       LOS: 6 days   Time Spent in minutes   30 minutes  Erven Ramson D.O. on 10/18/2018 at 10:36 AM  Between 7am to 7pm - Please see pager noted on [email or web address]  After 7pm go to [email or web address]  And look for the night coverage person covering for me after hours  [facility] Office  [phone]

## 2018-10-18 NOTE — Progress Notes (Signed)
PT Cancellation Note  Patient Details Name: [name] MRN: [id number] DOB: 09/09/1946   Cancelled Treatment:    Reason Eval/Treat Not Completed: Active bedrest order Strict bed rest orders. Will need updated activity orders prior to PT evaluation. Will continue to follow.  Lanney Gins, PT, DPT Supplemental Physical Therapist 10/18/18 8:24 AM Pager: [phone] Office: [phone]

## 2018-10-18 NOTE — Evaluation (Signed)
Physical Therapy Evaluation Patient Details Name: [name] MRN: [id number] DOB: May 13, 1946 Today's Date: 10/18/2018   History of Present Illness  Patient is a 73 y/o female presenting to the ED on 10/12/18 due to R ankle wound. Past medical history significant of severe MS, HTN, trigeminal neuralgia, gait disturbance, dementia, DVT. S/p R Transtibial amputation and Application of Prevena wound VAC on 10/17/18.     Clinical Impression  [name] is a 73 y/o female admitted with the above listed diagnosis. PT speaking with patients son on phone to obtain PLOF. Son reports patient required assist for transfers, mobility, and ADLs but was able to assist with all activities. Patient today requiring Max A +2 for all bed level mobility and will require short term SNF at discharge due to current functional status - son recognizes this and is in agreement. PT to continue to follow to maximize mobility.      Follow Up Recommendations SNF;Supervision/Assistance - 24 hour    Equipment Recommendations  Other (comment)(defer)    Recommendations for Other Services OT consult     Precautions / Restrictions Precautions Precautions: Fall Restrictions Weight Bearing Restrictions: Yes RLE Weight Bearing: Non weight bearing      Mobility  Bed Mobility Overal bed mobility: Needs Assistance Bed Mobility: Rolling;Supine to Sit;Sit to Supine Rolling: Mod assist;+2 for physical assistance   Supine to sit: Max assist;+2 for physical assistance Sit to supine: Max assist;+2 for physical assistance   General bed mobility comments: Mod A +2 for rolling R<>L - cueing for hand placement to help assist; All other bed mobility Max A +2 for LE and trunk management; use of bed pad; able to sit EOB x 3 min with Sup to Mod A at time  Transfers                 General transfer comment: deferred - noted unable to achieve L ankle neutral - in PF - will likely have difficulty with weight  bearing  Ambulation/Gait                Stairs            Wheelchair Mobility    Modified Rankin (Stroke Patients Only)       Balance Overall balance assessment: Needs assistance Sitting-balance support: Single extremity supported;Feet supported Sitting balance-Leahy Scale: Poor Sitting balance - Comments: SUP to Mod A with cueing  Postural control: Posterior lean                                   Pertinent Vitals/Pain Pain Assessment: No/denies pain    Home Living Family/patient expects to be discharged to:: Private residence Living Arrangements: Children Available Help at Discharge: Family Type of Home: House Home Access: Ramped entrance     Home Layout: One level Home Equipment: Environmental consultant - 2 wheels;Tub bench;Grab bars - tub/shower;Cane - single point;Walker - 4 wheels;Bedside commode;Wheelchair - manual      Prior Function Level of Independence: Needs assistance   Gait / Transfers Assistance Needed: would perform transfers and few steps with assist of family  ADL's / Homemaking Assistance Needed: required asssit for ADLs        Hand Dominance        Extremity/Trunk Assessment   Upper Extremity Assessment Upper Extremity Assessment: Defer to OT evaluation    Lower Extremity Assessment Lower Extremity Assessment: Generalized weakness;RLE deficits/detail;LLE deficits/detail RLE Deficits / Details: unable  to lift LE from bed against gravity LLE Deficits / Details: limited mobility; L ankle unable to achieve neutral - in PF    Cervical / Trunk Assessment Cervical / Trunk Assessment: Kyphotic  Communication   Communication: HOH  Cognition Arousal/Alertness: Awake/alert Behavior During Therapy: Flat affect Overall Cognitive Status: History of cognitive impairments - at baseline                                 General Comments: son stating history of cognitive deficits; likely at baseline      General Comments  General comments (skin integrity, edema, etc.): PT calling son to determine PLOF    Exercises     Assessment/Plan    PT Assessment Patient needs continued PT services  PT Problem List Decreased strength;Decreased activity tolerance;Decreased balance;Decreased mobility;Decreased knowledge of use of DME;Decreased safety awareness       PT Treatment Interventions DME instruction;Gait training;Functional mobility training;Therapeutic activities;Therapeutic exercise;Balance training;Neuromuscular re-education;Patient/family education    PT Goals (Current goals can be found in the Care Plan section)  Acute Rehab PT Goals Patient Stated Goal: none stated PT Goal Formulation: With patient Time For Goal Achievement: 11/01/18 Potential to Achieve Goals: Fair    Frequency Min 2X/week   Barriers to discharge        Co-evaluation               AM-PAC PT "6 Clicks" Mobility  Outcome Measure Help needed turning from your back to your side while in a flat bed without using bedrails?: A Lot Help needed moving from lying on your back to sitting on the side of a flat bed without using bedrails?: A Lot Help needed moving to and from a bed to a chair (including a wheelchair)?: Total Help needed standing up from a chair using your arms (e.g., wheelchair or bedside chair)?: Total Help needed to walk in hospital room?: Total Help needed climbing 3-5 steps with a railing? : Total 6 Click Score: 8    End of Session   Activity Tolerance: Patient tolerated treatment well Patient left: in bed;with call bell/phone within reach;with bed alarm set Nurse Communication: Mobility status PT Visit Diagnosis: Unsteadiness on feet (R26.81);Other abnormalities of gait and mobility (R26.89);Muscle weakness (generalized) (M62.81)    Time: 1100-1135 PT Time Calculation (min) (ACUTE ONLY): 35 min   Charges:   PT Evaluation $PT Eval Moderate Complexity: 1 Mod         [name], PT,  DPT Supplemental Physical Therapist 10/18/18 12:50 PM Pager: [phone] Office: [phone]

## 2018-10-18 NOTE — NC FL2 (Addendum)
[place] LEVEL OF CARE SCREENING TOOL     IDENTIFICATION  Patient Name: [name] Birthdate: 04-05-1946 Sex: female Admission Date (Current Location): 10/11/2018  [facility] and [place] Number:  Herbalist and Address:  The . [facility], [place] [address], [place], [place] [address]      Provider Number: [id number]  Attending Physician Name and Address:  Cristal Ford, DO  Relative Name and Phone Number:   Imagene Boss; son; [phone]    Current Level of Care: Hospital Recommended Level of Care: [place] Prior Approval Number:    Date Approved/Denied:   PASRR Number: [id number] (valid through 11/17/2018)  Discharge Plan:      Current Diagnoses: Patient Active Problem List   Diagnosis Date Noted  . Bedbound 10/13/2018  . Cellulitis of right lower extremity   . Wound infection 10/12/2018  . Subacute osteomyelitis, right ankle and foot ([place])   . Neurogenic bladder 06/16/2016  . Memory difficulties 12/10/2014  . Gait disorder 07/01/2013  . Tooth pain 11/02/2011  . Protein calorie malnutrition ([place]) 10/07/2011  . Constipation 09/23/2011  . Failure to thrive in childhood 09/22/2011  . Decubitus ulcer, buttock 09/22/2011  . Fibroid uterus 09/22/2011  . Abdominal pain, lower 09/22/2011  . Multiple sclerosis ([place])   . Hypertension   . Depression   . Organic brain syndrome   . Trigeminal neuralgia     Orientation RESPIRATION BLADDER Height & Weight     Self, Place  Normal Incontinent, External catheter Weight: 190 lb 0.6 oz (86.2 kg) Height:  5\' 3"  (160 cm)  BEHAVIORAL SYMPTOMS/MOOD NEUROLOGICAL BOWEL NUTRITION STATUS      Incontinent Diet(see discharge summary)  AMBULATORY STATUS COMMUNICATION OF NEEDS Skin   Extensive Assist Verbally Surgical wounds, Wound Vac(right BKA wih prevena wound vac)                       Personal Care Assistance Level of Assistance  Bathing, Feeding, Dressing  Bathing Assistance: Maximum assistance Feeding assistance: Limited assistance Dressing Assistance: Maximum assistance     Functional Limitations Info  Sight, Hearing, Speech Sight Info: Impaired Hearing Info: Adequate Speech Info: Adequate    SPECIAL CARE FACTORS FREQUENCY  PT (By licensed PT), OT (By licensed OT)     PT Frequency: 5x week OT Frequency: 5x week            Contractures Contractures Info: Not present    Additional Factors Info  Code Status, Allergies, Psychotropic Code Status Info: Full Code Allergies Info: PREDNISONE, SHELLFISH ALLERGY  Psychotropic Info: buPROPion (WELLBUTRIN XL) 24 hr tablet 300 mg daily PO; donepezil (ARICEPT) tablet 5 mg daily PO; sertraline (ZOLOFT) tablet 75 mg daily PO         Current Medications (10/18/2018):  This is the current hospital active medication list Current Facility-Administered Medications  Medication Dose Route Frequency Provider Last Rate Last Dose  . 0.45 % sodium chloride infusion   Intravenous Continuous [name], MD 40 mL/hr at 10/17/18 0830    . 0.9 %  sodium chloride infusion   Intravenous Continuous [name], MD      . acetaminophen (TYLENOL) tablet 650 mg  650 mg Oral Q6H PRN [name], MD   650 mg at 10/16/18 1719   Or  . acetaminophen (TYLENOL) suppository 650 mg  650 mg Rectal Q6H PRN [name], MD      . acetaminophen (TYLENOL) tablet 325-650 mg  325-650 mg Oral Q6H PRN [name], MD      . albuterol (PROVENTIL) (2.5 MG/3ML) 0.083% nebulizer solution 2.5 mg  2.5 mg Nebulization Q6H PRN [name], MD      . amLODipine (NORVASC) tablet 10 mg  10 mg Oral Daily [name], MD   10 mg at 10/18/18 0809  . atorvastatin (LIPITOR) tablet 40 mg  40 mg Oral Daily [name], MD   40 mg at 10/18/18 4270  . baclofen (LIORESAL) tablet 10 mg  10 mg Oral TID [name], MD   10 mg at 10/18/18 0809  . bisacodyl (DULCOLAX) suppository 10 mg  10 mg Rectal Daily PRN [name],  MD      . buPROPion (WELLBUTRIN XL) 24 hr tablet 300 mg  300 mg Oral Daily [name], MD   300 mg at 10/18/18 0809  . carbamazepine (TEGRETOL) chewable tablet 300 mg  300 mg Oral Q breakfast [name], MD   300 mg at 10/18/18 6237   And  . carbamazepine (TEGRETOL) chewable tablet 200 mg  200 mg Oral BID [name], MD   200 mg at 10/17/18 2111  . cefTRIAXone (ROCEPHIN) 2 g in sodium chloride 0.9 % 100 mL IVPB  2 g Intravenous Q24H Cristal Ford, DO   Stopped at 10/17/18 2202  . docusate sodium (COLACE) capsule 100 mg  100 mg Oral BID [name], MD   100 mg at 10/18/18 6283  . donepezil (ARICEPT) tablet 5 mg  5 mg Oral Daily [name], MD   5 mg at 10/18/18 0810  . enoxaparin (LOVENOX) injection 40 mg  40 mg Subcutaneous Daily [name], MD   40 mg at 10/18/18 0810  . fesoterodine (TOVIAZ) tablet 4 mg  4 mg Oral Daily [name], MD   4 mg at 10/18/18 1055  . hydrALAZINE (APRESOLINE) injection 10 mg  10 mg Intravenous Q6H PRN [name], MD      . hydrocerin (EUCERIN) cream 1 application  1 application Topical Daily PRN [name], MD      . HYDROmorphone (DILAUDID) injection 0.5-1 mg  0.5-1 mg Intravenous Q4H PRN [name], MD      . Interferon beta -1a (Avonex Pen) - patient's own supply  30 mcg Intramuscular Weekly [name], MD   Stopped at 10/18/18 1307  . labetalol (NORMODYNE) tablet 100 mg  100 mg Oral BID [name], MD   100 mg at 10/18/18 1517  . lactated ringers infusion   Intravenous Continuous [name], MD 10 mL/hr at 10/17/18 1408    . liver oil-zinc oxide (DESITIN) 40 % ointment   Topical QID [name], MD      . magnesium citrate solution 1 Bottle  1 Bottle Oral Once PRN [name], MD      . methocarbamol (ROBAXIN) tablet 500 mg  500 mg Oral Q6H PRN [name], MD   500 mg at 10/17/18 2112   Or  . methocarbamol (ROBAXIN) 500 mg in dextrose 5 % 50 mL IVPB  500 mg Intravenous Q6H PRN [name], MD       . metoCLOPramide (REGLAN) tablet 5-10 mg  5-10 mg Oral Q8H PRN [name], MD       Or  . metoCLOPramide (REGLAN) injection 5-10 mg  5-10 mg Intravenous Q8H PRN [name], MD      .  ondansetron (ZOFRAN) tablet 4 mg  4 mg Oral Q6H PRN [name], MD       Or  . ondansetron [facility]) injection 4 mg  4 mg Intravenous Q6H PRN [name], MD      . ondansetron [facility]) tablet 4 mg  4 mg Oral Q6H PRN [name], MD       Or  . ondansetron [facility]) injection 4 mg  4 mg Intravenous Q6H PRN [name], MD      . oxyCODONE (Oxy IR/ROXICODONE) immediate release tablet 10-15 mg  10-15 mg Oral Q4H PRN [name], MD      . oxyCODONE (Oxy IR/ROXICODONE) immediate release tablet 5-10 mg  5-10 mg Oral Q4H PRN [name], MD   10 mg at 10/18/18 0549  . polyethylene glycol (MIRALAX / GLYCOLAX) packet 17 g  17 g Oral Daily PRN [name], MD      . potassium chloride SA (K-DUR,KLOR-CON) CR tablet 40 mEq  40 mEq Oral Once [name], MD   Stopped at 10/16/18 1729  . pregabalin (LYRICA) capsule 150 mg  150 mg Oral BID [name], MD   150 mg at 10/18/18 2536  . sertraline (ZOLOFT) tablet 75 mg  75 mg Oral Daily [name], MD   75 mg at 10/18/18 6440  . traMADol (ULTRAM) tablet 50 mg  50 mg Oral Q6H PRN [name], MD   50 mg at 10/16/18 1511  . white petrolatum (VASELINE) gel   Topical BID PRN [name], MD         Discharge Medications: Please see discharge summary for a list of discharge medications.  Relevant Imaging Results:  Relevant Lab Results:   Additional Information [id number] [place], [place]

## 2018-10-19 LAB — BASIC METABOLIC PANEL
Anion gap: 9 (ref 5–15)
BUN: 8 mg/dL (ref 8–23)
CO2: 25 mmol/L (ref 22–32)
Calcium: 8.4 mg/dL — ABNORMAL LOW (ref 8.9–10.3)
Chloride: 104 mmol/L (ref 98–111)
Creatinine, Ser: 0.7 mg/dL (ref 0.44–1.00)
GFR calc Af Amer: >60 mL/min (ref >60)
GFR calc non Af Amer: >60 mL/min (ref >60)
Glucose, Bld: 96 mg/dL (ref 70–99)
POTASSIUM: 3.6 mmol/L (ref 3.5–5.1)
Sodium: 138 mmol/L (ref 135–145)

## 2018-10-19 LAB — HEMOGLOBIN AND HEMATOCRIT, BLOOD
HCT: 29.9 % — ABNORMAL LOW (ref 36.0–46.0)
HEMOGLOBIN: 9.4 g/dL — AB (ref 12.0–15.0)

## 2018-10-19 NOTE — Social Work (Signed)
CMS SNF packet left in room, pt son called and updated where information was. He is aware we need choice to initiate authorization.  Westley Hummer, MSW, [place] Work [phone]

## 2018-10-19 NOTE — Progress Notes (Signed)
Subjective: 2 Days Post-Op Procedure(s) (LRB): RIGHT BELOW KNEE AMPUTATION (Right) Patient reports pain as none.    Objective: Vital signs in last 24 hours: Temp:  [99 F (37.2 C)-99.3 F (37.4 C)] 99 F (37.2 C) (01/10 0611) Pulse Rate:  [90-98] 98 (01/10 0611) Resp:  [16-18] 16 (01/10 0611) BP: (129-149)/(60-81) 136/75 (01/10 0611) SpO2:  [96 %-98 %] 98 % (01/10 0611)  Intake/Output from previous day: 01/09 0701 - 01/10 0700 In: 841.2 [P.O.:480; I.V.:361.2] Out: 400 [Urine:400] Intake/Output this shift: No intake/output data recorded.  Recent Labs    10/18/18 0335 10/19/18 0528  HGB 9.3* 9.4*   Recent Labs    10/18/18 0335 10/19/18 0528  WBC 8.1  --   RBC 3.00*  --   HCT 28.4* 29.9*  PLT 244  --    Recent Labs    10/18/18 0335 10/19/18 0528  NA 139 138  K 3.3* 3.6  CL 105 104  CO2 26 25  BUN 7* 8  CREATININE 0.68 0.70  GLUCOSE 102* 96  CALCIUM 8.5* 8.4*   No results for input(s): LABPT, INR in the last 72 hours. Patient confused. [place] on hands.  VAC dressing intact and functioning well. Scant drainage in tubing/canister. VAC plugged in to wall outlet.     Assessment/Plan: 2 Days Post-Op Procedure(s) (LRB): RIGHT BELOW KNEE AMPUTATION (Right) Discharge to SNF  Plan DC with Prevena VAC to SNF for rehab.  Will need follow up in office 1 week following discharge.     Coyle Stordahl 10/19/2018, 7:57 AM  [place]

## 2018-10-19 NOTE — Progress Notes (Signed)
PROGRESS NOTE    [name]  PJA:[id number] DOB: 06/05/1946 DOA: 10/11/2018 PCP: [name], MD (Inactive)   Brief Narrative:  HPI on 10/12/2018 by Dr. Fuller Plan [name] is a 73 y.o. female with medical history significant of severe MS, HTN, trigeminal neuralgia, gait disturbance, dementia, DVT; who presents with complaints of nonhealing right ankle wound.  Which of history comes from the patient's son is her primary caregiver.  At baseline patient is fairly debilitated needs assistance with transfers.  2 weeks ago she hit her right foot on a wooden door frame while trying to transfer and sustained a cut.  Son notes that they dressed the wound with a bandage.  They initially thought that the wound was healing up until last week when they noticed foul smelling drainage coming from the wound.  Since that time he has noted that the wound was not healing and it has turned black.  Trying to get her into her primary care provider, but they were all booked up.  The son noted that she was intermittently more confused than normal and questioned if it was related to the wound.  Denies any reports of fever, cough, shortness of breath, chest pain, nausea, vomiting, diarrhea, or dysuria symptoms.  Patient denies any complaints currently at this time.  Interim history Patient admitted with right ankle and foot ulcer and osteomyelitis, now s/p for BKA by Dr. [name]. Assessment & Plan   Right ankle wound/osteomyelitis -Patient placed on vancomycin and Rocephin, failed conservative measures -Dr. [name], orthopedic surgery, consulted and appreciated, status post right BKA  -Continue pain control -Per ortho, may discontinue antibiotics 24hrs post operatively -PT recommended SNF -OT pending -Discussed SNF with son- he agrees.  -Social work consulted  Multiple sclerosis -With baseline bilateral lower extremity paralysis -Continue baclofen, interferon injections every Wednesday -At baseline,  patient has significant difficulties with mobility related to ADLs  Essential hypertension -Continue labetalol, amlodipine, IV hydralazine as needed  Dementia -Continue Aricept  History of trigeminal neuralgia -Continues to complain of facial pain and dental pain -Continue Lyrica and Tegretol  Chronic microcytic anemia -Hemoglobin appears to be stable, continue to monitor closely  Hypokalemia -Will replace and continue to monitor BMP  DVT Prophylaxis  Lovenox  Code Status: Full  Family Communication: None at bedside  Disposition Plan: Admitted. Pending SNF  Consultants Orthopedic surgery, Dr. [name]  Procedures  Transtibial amputation  Antibiotics   Anti-infectives (From admission, onward)   Start     Dose/Rate Route Frequency Ordered Stop   10/18/18 0600  ceFAZolin (ANCEF) IVPB 2g/100 mL premix     2 g 200 mL/hr over 30 Minutes Intravenous On call to O.R. 10/17/18 1357 10/18/18 0623   10/17/18 1402  ceFAZolin (ANCEF) 2-4 GM/100ML-% IVPB    Note to Pharmacy:  [facility]   : cabinet override      10/17/18 1402 10/17/18 1559   10/14/18 0600  vancomycin (VANCOCIN) IVPB 1000 mg/200 mL premix     1,000 mg 200 mL/hr over 60 Minutes Intravenous Every 24 hours 10/13/18 0925 10/18/18 0654   10/13/18 0600  vancomycin (VANCOCIN) IVPB 750 mg/150 ml premix  Status:  Discontinued     750 mg 150 mL/hr over 60 Minutes Intravenous Every 24 hours 10/12/18 0513 10/13/18 0925   10/12/18 2200  cefTRIAXone (ROCEPHIN) 2 g in sodium chloride 0.9 % 100 mL IVPB     2 g 200 mL/hr over 30 Minutes Intravenous Every 24 hours 10/12/18 0506 10/18/18 2347  10/11/18 2330  vancomycin (VANCOCIN) 1,250 mg in sodium chloride 0.9 % 250 mL IVPB     1,250 mg 166.7 mL/hr over 90 Minutes Intravenous  Once 10/11/18 2305 10/12/18 0252   10/11/18 2315  cefTRIAXone (ROCEPHIN) 2 g in sodium chloride 0.9 % 100 mL IVPB     2 g 200 mL/hr over 30 Minutes Intravenous  Once 10/11/18 2305 10/12/18 0015       Subjective:   [name] seen and examined today.  Complains of tooth pain.  Denies chest pain, shortness of breath, abdominal pain, nausea or vomiting, dizziness or headache.   Objective:   Vitals:   10/18/18 1035 10/18/18 2129 10/19/18 0611 10/19/18 1117  BP: 129/81 (!) 149/60 136/75 (!) 147/72  Pulse: 90 92 98 89  Resp: 18 17 16    Temp: 99.1 F (37.3 C) 99.3 F (37.4 C) 99 F (37.2 C)   TempSrc: Oral Other (Comment) Oral   SpO2: 97% 96% 98%   Weight:      Height:        Intake/Output Summary (Last 24 hours) at 10/19/2018 1327 Last data filed at 10/19/2018 0500 Gross per 24 hour  Intake 481.17 ml  Output -  Net 481.17 ml   Filed Weights   10/13/18 0354 10/17/18 1359  Weight: 86.2 kg 86.2 kg   Exam  General: Well developed, well nourished, NAD, appears stated age  HEENT: NCAT, mucous membranes moist.   Neck: Supple  Cardiovascular: S1 S2 auscultated, RRR, no murmur  Respiratory: Clear to auscultation bilaterally with equal chest rise  Abdomen: Soft, nontender, nondistended, + bowel sounds  Extremities: warm dry without cyanosis clubbing or edema of LLE. R BKA w/ wound vac  Neuro: AAOx2, LE deficits consistent with MS (chronic), otherwise nonfocal  Psych: Normal affect and demeanor with intact judgement and insight  Data Reviewed: I have personally reviewed following labs and imaging studies  CBC: Recent Labs  Lab 10/14/18 0509 10/16/18 0414 10/18/18 0335 10/19/18 0528  WBC 4.0 6.2 8.1  --   HGB 9.8* 9.9* 9.3* 9.4*  HCT 29.9* 31.1* 28.4* 29.9*  MCV 93.1 95.1 94.7  --   PLT 176 219 244  --    Basic Metabolic Panel: Recent Labs  Lab 10/14/18 0509 10/15/18 0154 10/16/18 0414 10/18/18 0335 10/19/18 0528  NA 139 139 140 139 138  K 2.7* 3.3* 3.9 3.3* 3.6  CL 105 107 107 105 104  CO2 25 22 23 26 25   GLUCOSE 88 97 94 102* 96  BUN <5* 5* 7* 7* 8  CREATININE 0.65 0.70 0.72 0.68 0.70  CALCIUM 8.2* 8.6* 8.7* 8.5* 8.4*   GFR: Estimated  Creatinine Clearance: 68.1 mL/min (by C-G formula based on SCr of 0.7 mg/dL). Liver Function Tests: No results for input(s): AST, ALT, ALKPHOS, BILITOT, PROT, ALBUMIN in the last 168 hours. No results for input(s): LIPASE, AMYLASE in the last 168 hours. No results for input(s): AMMONIA in the last 168 hours. Coagulation Profile: No results for input(s): INR, PROTIME in the last 168 hours. Cardiac Enzymes: No results for input(s): CKTOTAL, CKMB, CKMBINDEX, TROPONINI in the last 168 hours. BNP (last 3 results) No results for input(s): PROBNP in the last 8760 hours. HbA1C: No results for input(s): HGBA1C in the last 72 hours. CBG: No results for input(s): GLUCAP in the last 168 hours. Lipid Profile: No results for input(s): CHOL, HDL, LDLCALC, TRIG, CHOLHDL, LDLDIRECT in the last 72 hours. Thyroid Function Tests: No results for input(s): TSH, T4TOTAL, FREET4, T3FREE,  THYROIDAB in the last 72 hours. Anemia Panel: No results for input(s): VITAMINB12, FOLATE, FERRITIN, TIBC, IRON, RETICCTPCT in the last 72 hours. Urine analysis:    Component Value Date/Time   COLORURINE ORANGE (A) 09/28/2011 1846   APPEARANCEUR Cloudy (A) 02/04/2014 1313   LABSPEC 1.036 (H) 09/28/2011 1846   PHURINE 6.5 09/28/2011 1846   GLUCOSEU Negative 02/04/2014 1313   HGBUR MODERATE (A) 09/28/2011 1846   BILIRUBINUR Negative 02/04/2014 1313   KETONESUR 15 (A) 09/28/2011 1846   PROTEINUR Negative 02/04/2014 1313   PROTEINUR 100 (A) 09/28/2011 1846   UROBILINOGEN 2.0 (H) 09/28/2011 1846   NITRITE Negative 02/04/2014 1313   NITRITE POSITIVE (A) 09/28/2011 1846   LEUKOCYTESUR Negative 02/04/2014 1313   Sepsis Labs: @LABRCNTIP (procalcitonin:4,lacticidven:4)  ) Recent Results (from the past 240 hour(s))  Culture, blood (Routine x 2)     Status: None   Collection Time: 10/11/18  3:24 PM  Result Value Ref Range Status   Specimen Description BLOOD LEFT WRIST  Final   Special Requests   Final    BOTTLES DRAWN  AEROBIC AND ANAEROBIC Blood Culture adequate volume   Culture   Final    NO GROWTH 5 DAYS Performed at [facility], [place] [address], [place], [place] [address]    Report Status 10/16/2018 FINAL  Final  Culture, blood (Routine x 2)     Status: None   Collection Time: 10/11/18  3:39 PM  Result Value Ref Range Status   Specimen Description BLOOD LEFT HAND  Final   Special Requests   Final    BOTTLES DRAWN AEROBIC ONLY Blood Culture results may not be optimal due to an inadequate volume of blood received in culture bottles   Culture   Final    NO GROWTH 5 DAYS Performed at [facility], [place] [address], [place], [place] [address]    Report Status 10/16/2018 FINAL  Final  Surgical PCR screen     Status: Abnormal   Collection Time: 10/17/18  5:12 AM  Result Value Ref Range Status   MRSA, PCR NEGATIVE NEGATIVE Final   Staphylococcus aureus POSITIVE (A) NEGATIVE Final    Comment: (NOTE) The Xpert SA Assay (FDA approved for NASAL specimens in patients 28 years of age and older), is one component of a comprehensive surveillance program. It is not intended to diagnose infection nor to guide or monitor treatment. Performed at [facility], [place] [address], [place], [place] [address]       Radiology Studies: No results found.   Scheduled Meds: . amLODipine  10 mg Oral Daily  . atorvastatin  40 mg Oral Daily  . baclofen  10 mg Oral TID  . buPROPion  300 mg Oral Daily  . carbamazepine  300 mg Oral Q breakfast   And  . carbamazepine  200 mg Oral BID  . docusate sodium  100 mg Oral BID  . donepezil  5 mg Oral Daily  . enoxaparin (LOVENOX) injection  40 mg Subcutaneous Daily  . fesoterodine  4 mg Oral Daily  . Interferon Beta-1a  30 mcg Intramuscular Weekly  . labetalol  100 mg Oral BID  . liver oil-zinc oxide   Topical QID  . potassium chloride  40 mEq Oral Once  . pregabalin  150 mg Oral BID  . sertraline  75 mg Oral Daily   Continuous Infusions: . sodium  chloride 40 mL/hr at 10/19/18 0400  . sodium chloride    . lactated ringers 10  mL/hr at 10/17/18 1408  . methocarbamol (ROBAXIN) IV       LOS: 7 days   Time Spent in minutes   30 minutes  Jobany Montellano D.O. on 10/19/2018 at 1:27 PM  Between 7am to 7pm - Please see pager noted on [email or web address]  After 7pm go to [email or web address]  And look for the night coverage person covering for me after hours  [facility] Office  [phone]

## 2018-10-19 NOTE — Plan of Care (Signed)
  Problem: Clinical Measurements: °Goal: Will remain free from infection °Outcome: Progressing °  °Problem: Nutrition: °Goal: Adequate nutrition will be maintained °Outcome: Progressing °  °Problem: Coping: °Goal: Level of anxiety will decrease °Outcome: Progressing °  °Problem: Elimination: °Goal: Will not experience complications related to urinary retention °Outcome: Progressing °  °Problem: Pain Managment: °Goal: General experience of comfort will improve °Outcome: Progressing °  °Problem: Safety: °Goal: Ability to remain free from injury will improve °Outcome: Progressing °  °Problem: Skin Integrity: °Goal: Risk for impaired skin integrity will decrease °Outcome: Progressing °  °

## 2018-10-20 MED ORDER — BENZOCAINE 10 % MT GEL
Freq: Three times a day (TID) | OROMUCOSAL | Status: DC | PRN
  Filled 2018-10-20 (×2): qty 9

## 2018-10-20 NOTE — Plan of Care (Signed)
  Problem: Clinical Measurements: Goal: Ability to maintain clinical measurements within normal limits will improve Outcome: Progressing Goal: Will remain free from infection Outcome: Progressing   Problem: Nutrition: Goal: Adequate nutrition will be maintained Outcome: Progressing   Problem: Coping: Goal: Level of anxiety will decrease Outcome: Progressing   Problem: Pain Managment: Goal: General experience of comfort will improve Outcome: Progressing   Problem: Safety: Goal: Ability to remain free from injury will improve Outcome: Progressing   Problem: Skin Integrity: Goal: Risk for impaired skin integrity will decrease Outcome: Progressing   

## 2018-10-20 NOTE — Progress Notes (Signed)
Per report received from night shift RN patient takes medications whole in applesauce.  Spent 35 minutes trying to administer medications to patient with applesauce and patient consistently spit pills out.  Some medications dissolved so were taken but some were unable to be administered.  Should possibly consider other forms of administering medically necessary medication.  Will continue to monitor.

## 2018-10-20 NOTE — Progress Notes (Signed)
PROGRESS NOTE    [name]  IRC:[id number] DOB: November 24, 1945 DOA: 10/11/2018 PCP: [name], MD (Inactive)   Brief Narrative:  HPI on 10/12/2018 by Dr. Fuller Plan [name] is a 73 y.o. female with medical history significant of severe MS, HTN, trigeminal neuralgia, gait disturbance, dementia, DVT; who presents with complaints of nonhealing right ankle wound.  Which of history comes from the patient's son is her primary caregiver.  At baseline patient is fairly debilitated needs assistance with transfers.  2 weeks ago she hit her right foot on a wooden door frame while trying to transfer and sustained a cut.  Son notes that they dressed the wound with a bandage.  They initially thought that the wound was healing up until last week when they noticed foul smelling drainage coming from the wound.  Since that time he has noted that the wound was not healing and it has turned black.  Trying to get her into her primary care provider, but they were all booked up.  The son noted that she was intermittently more confused than normal and questioned if it was related to the wound.  Denies any reports of fever, cough, shortness of breath, chest pain, nausea, vomiting, diarrhea, or dysuria symptoms.  Patient denies any complaints currently at this time.  Interim history Patient admitted with right ankle and foot ulcer and osteomyelitis, now s/p for BKA by Dr. [name]. Pending SNF. Assessment & Plan   Right ankle wound/osteomyelitis -Patient placed on vancomycin and Rocephin, failed conservative measures -Dr. [name], orthopedic surgery, consulted and appreciated, status post right BKA  -Continue pain control -Per ortho, may discontinue antibiotics 24hrs post operatively -PT recommended SNF -OT pending -Discussed SNF with son- he agrees.  -Social work consulted  Multiple sclerosis -With baseline bilateral lower extremity paralysis -Continue baclofen, interferon injections every Wednesday -At  baseline, patient has significant difficulties with mobility related to ADLs  Essential hypertension -Continue labetalol, amlodipine, IV hydralazine as needed  Dementia -Continue Aricept  History of trigeminal neuralgia -Continues to complain of facial pain and dental pain -Continue Lyrica and Tegretol -will add on orajel  Chronic microcytic anemia -Hemoglobin appears to be stable, continue to monitor closely  Hypokalemia -Resolved with replacement, continue to monitor BMP  DVT Prophylaxis  Lovenox  Code Status: Full  Family Communication: None at bedside  Disposition Plan: Admitted. Pending SNF  Consultants Orthopedic surgery, Dr. [name]  Procedures  Transtibial amputation  Antibiotics   Anti-infectives (From admission, onward)   Start     Dose/Rate Route Frequency Ordered Stop   10/18/18 0600  ceFAZolin (ANCEF) IVPB 2g/100 mL premix     2 g 200 mL/hr over 30 Minutes Intravenous On call to O.R. 10/17/18 1357 10/18/18 0623   10/17/18 1402  ceFAZolin (ANCEF) 2-4 GM/100ML-% IVPB    Note to Pharmacy:  [facility]   : cabinet override      10/17/18 1402 10/17/18 1559   10/14/18 0600  vancomycin (VANCOCIN) IVPB 1000 mg/200 mL premix     1,000 mg 200 mL/hr over 60 Minutes Intravenous Every 24 hours 10/13/18 0925 10/18/18 0654   10/13/18 0600  vancomycin (VANCOCIN) IVPB 750 mg/150 ml premix  Status:  Discontinued     750 mg 150 mL/hr over 60 Minutes Intravenous Every 24 hours 10/12/18 0513 10/13/18 0925   10/12/18 2200  cefTRIAXone (ROCEPHIN) 2 g in sodium chloride 0.9 % 100 mL IVPB     2 g 200 mL/hr over 30 Minutes Intravenous Every 24 hours  10/12/18 0506 10/18/18 2347   10/11/18 2330  vancomycin (VANCOCIN) 1,250 mg in sodium chloride 0.9 % 250 mL IVPB     1,250 mg 166.7 mL/hr over 90 Minutes Intravenous  Once 10/11/18 2305 10/12/18 0252   10/11/18 2315  cefTRIAXone (ROCEPHIN) 2 g in sodium chloride 0.9 % 100 mL IVPB     2 g 200 mL/hr over 30 Minutes Intravenous   Once 10/11/18 2305 10/12/18 0015      Subjective:   [name] seen and examined today.  Continues to complain of right-sided facial and tooth pain.  Denies current chest pain, shortness of breath abdominal pain, nausea or vomiting.    Objective:   Vitals:   10/19/18 1117 10/19/18 1335 10/19/18 2111 10/20/18 0543  BP: (!) 147/72 (!) 143/70 (!) 141/64 (!) 156/83  Pulse: 89 92 88 90  Resp:   (!) 22 18  Temp:  98.9 F (37.2 C) 98.8 F (37.1 C) 99 F (37.2 C)  TempSrc:  Oral Oral Oral  SpO2:  100% 98% 97%  Weight:      Height:        Intake/Output Summary (Last 24 hours) at 10/20/2018 1235 Last data filed at 10/20/2018 1200 Gross per 24 hour  Intake 1634.06 ml  Output 1250 ml  Net 384.06 ml   Filed Weights   10/13/18 0354 10/17/18 1359  Weight: 86.2 kg 86.2 kg   Exam  General: Well developed, well nourished, NAD, appears stated age  50: NCAT, mucous membranes moist.  Poor dentition  Neck: Supple  Cardiovascular: S1 S2 auscultated, RRR, no murmur  Respiratory: Clear to auscultation bilaterally with equal chest rise  Abdomen: Soft, nontender, nondistended, + bowel sounds  Extremities: warm dry without cyanosis clubbing or edema of LLE.  Right BKA with wound VAC in place.  Neuro: AAOx2, no new deficits  Psych: Pleasant and appropriate  Data Reviewed: I have personally reviewed following labs and imaging studies  CBC: Recent Labs  Lab 10/14/18 0509 10/16/18 0414 10/18/18 0335 10/19/18 0528  WBC 4.0 6.2 8.1  --   HGB 9.8* 9.9* 9.3* 9.4*  HCT 29.9* 31.1* 28.4* 29.9*  MCV 93.1 95.1 94.7  --   PLT 176 219 244  --    Basic Metabolic Panel: Recent Labs  Lab 10/14/18 0509 10/15/18 0154 10/16/18 0414 10/18/18 0335 10/19/18 0528  NA 139 139 140 139 138  K 2.7* 3.3* 3.9 3.3* 3.6  CL 105 107 107 105 104  CO2 25 22 23 26 25   GLUCOSE 88 97 94 102* 96  BUN <5* 5* 7* 7* 8  CREATININE 0.65 0.70 0.72 0.68 0.70  CALCIUM 8.2* 8.6* 8.7* 8.5* 8.4*    GFR: Estimated Creatinine Clearance: 68.1 mL/min (by C-G formula based on SCr of 0.7 mg/dL). Liver Function Tests: No results for input(s): AST, ALT, ALKPHOS, BILITOT, PROT, ALBUMIN in the last 168 hours. No results for input(s): LIPASE, AMYLASE in the last 168 hours. No results for input(s): AMMONIA in the last 168 hours. Coagulation Profile: No results for input(s): INR, PROTIME in the last 168 hours. Cardiac Enzymes: No results for input(s): CKTOTAL, CKMB, CKMBINDEX, TROPONINI in the last 168 hours. BNP (last 3 results) No results for input(s): PROBNP in the last 8760 hours. HbA1C: No results for input(s): HGBA1C in the last 72 hours. CBG: No results for input(s): GLUCAP in the last 168 hours. Lipid Profile: No results for input(s): CHOL, HDL, LDLCALC, TRIG, CHOLHDL, LDLDIRECT in the last 72 hours. Thyroid Function Tests: No  results for input(s): TSH, T4TOTAL, FREET4, T3FREE, THYROIDAB in the last 72 hours. Anemia Panel: No results for input(s): VITAMINB12, FOLATE, FERRITIN, TIBC, IRON, RETICCTPCT in the last 72 hours. Urine analysis:    Component Value Date/Time   COLORURINE ORANGE (A) 09/28/2011 1846   APPEARANCEUR Cloudy (A) 02/04/2014 1313   LABSPEC 1.036 (H) 09/28/2011 1846   PHURINE 6.5 09/28/2011 1846   GLUCOSEU Negative 02/04/2014 1313   HGBUR MODERATE (A) 09/28/2011 1846   BILIRUBINUR Negative 02/04/2014 1313   KETONESUR 15 (A) 09/28/2011 1846   PROTEINUR Negative 02/04/2014 1313   PROTEINUR 100 (A) 09/28/2011 1846   UROBILINOGEN 2.0 (H) 09/28/2011 1846   NITRITE Negative 02/04/2014 1313   NITRITE POSITIVE (A) 09/28/2011 1846   LEUKOCYTESUR Negative 02/04/2014 1313   Sepsis Labs: @LABRCNTIP (procalcitonin:4,lacticidven:4)  ) Recent Results (from the past 240 hour(s))  Culture, blood (Routine x 2)     Status: None   Collection Time: 10/11/18  3:24 PM  Result Value Ref Range Status   Specimen Description BLOOD LEFT WRIST  Final   Special Requests   Final     BOTTLES DRAWN AEROBIC AND ANAEROBIC Blood Culture adequate volume   Culture   Final    NO GROWTH 5 DAYS Performed at [facility], [place] [address] [place], [place] [address]    Report Status 10/16/2018 FINAL  Final  Culture, blood (Routine x 2)     Status: None   Collection Time: 10/11/18  3:39 PM  Result Value Ref Range Status   Specimen Description BLOOD LEFT HAND  Final   Special Requests   Final    BOTTLES DRAWN AEROBIC ONLY Blood Culture results may not be optimal due to an inadequate volume of blood received in culture bottles   Culture   Final    NO GROWTH 5 DAYS Performed at [facility], [place] [address], [place], [place] [address]    Report Status 10/16/2018 FINAL  Final  Surgical PCR screen     Status: Abnormal   Collection Time: 10/17/18  5:12 AM  Result Value Ref Range Status   MRSA, PCR NEGATIVE NEGATIVE Final   Staphylococcus aureus POSITIVE (A) NEGATIVE Final    Comment: (NOTE) The Xpert SA Assay (FDA approved for NASAL specimens in patients 74 years of age and older), is one component of a comprehensive surveillance program. It is not intended to diagnose infection nor to guide or monitor treatment. Performed at [facility], [place] [address] [place], [place] [address]       Radiology Studies: No results found.   Scheduled Meds: . amLODipine  10 mg Oral Daily  . atorvastatin  40 mg Oral Daily  . baclofen  10 mg Oral TID  . buPROPion  300 mg Oral Daily  . carbamazepine  300 mg Oral Q breakfast   And  . carbamazepine  200 mg Oral BID  . docusate sodium  100 mg Oral BID  . donepezil  5 mg Oral Daily  . enoxaparin (LOVENOX) injection  40 mg Subcutaneous Daily  . fesoterodine  4 mg Oral Daily  . Interferon Beta-1a  30 mcg Intramuscular Weekly  . labetalol  100 mg Oral BID  . liver oil-zinc oxide   Topical QID  . potassium chloride  40 mEq Oral Once  . pregabalin  150 mg Oral BID  . sertraline  75 mg Oral Daily   Continuous  Infusions: . sodium chloride Stopped (10/20/18 1121)  . sodium chloride Stopped (10/20/18  1159)  . lactated ringers 10 mL/hr at 10/17/18 1408  . methocarbamol (ROBAXIN) IV       LOS: 8 days   Time Spent in minutes   30 minutes  Annye Forrey D.O. on 10/20/2018 at 12:35 PM  Between 7am to 7pm - Please see pager noted on [email or web address]  After 7pm go to [email or web address]  And look for the night coverage person covering for me after hours  [facility] Office  [phone]

## 2018-10-21 LAB — BASIC METABOLIC PANEL
Anion gap: 8 (ref 5–15)
BUN: 6 mg/dL — ABNORMAL LOW (ref 8–23)
CO2: 28 mmol/L (ref 22–32)
Calcium: 8.5 mg/dL — ABNORMAL LOW (ref 8.9–10.3)
Chloride: 102 mmol/L (ref 98–111)
Creatinine, Ser: 0.59 mg/dL (ref 0.44–1.00)
GFR calc Af Amer: >60 mL/min (ref >60)
GFR calc non Af Amer: >60 mL/min (ref >60)
Glucose, Bld: 93 mg/dL (ref 70–99)
Potassium: 3.3 mmol/L — ABNORMAL LOW (ref 3.5–5.1)
Sodium: 138 mmol/L (ref 135–145)

## 2018-10-21 MED ORDER — POTASSIUM CHLORIDE 20 MEQ/15ML (10%) PO SOLN
40.0000 meq | Freq: Every day | ORAL | Status: DC
  Administered 2018-10-21 – 2018-10-25 (×5): 40 meq via ORAL
  Filled 2018-10-21 (×5): qty 30

## 2018-10-21 MED ORDER — POTASSIUM CHLORIDE CRYS ER 20 MEQ PO TBCR: 40.0000 meq | EXTENDED_RELEASE_TABLET | Freq: Once | ORAL | Status: DC

## 2018-10-21 NOTE — Progress Notes (Signed)
PROGRESS NOTE    [name]  AUQ:[id number] DOB: Jul 17, 1946 DOA: 10/11/2018 PCP: [name], MD (Inactive)   Brief Narrative:  HPI on 10/12/2018 by Dr. Fuller Plan [name] is a 73 y.o. female with medical history significant of severe MS, HTN, trigeminal neuralgia, gait disturbance, dementia, DVT; who presents with complaints of nonhealing right ankle wound.  Which of history comes from the patient's son is her primary caregiver.  At baseline patient is fairly debilitated needs assistance with transfers.  2 weeks ago she hit her right foot on a wooden door frame while trying to transfer and sustained a cut.  Son notes that they dressed the wound with a bandage.  They initially thought that the wound was healing up until last week when they noticed foul smelling drainage coming from the wound.  Since that time he has noted that the wound was not healing and it has turned black.  Trying to get her into her primary care provider, but they were all booked up.  The son noted that she was intermittently more confused than normal and questioned if it was related to the wound.  Denies any reports of fever, cough, shortness of breath, chest pain, nausea, vomiting, diarrhea, or dysuria symptoms.  Patient denies any complaints currently at this time.  Interim history Patient admitted with right ankle and foot ulcer and osteomyelitis, now s/p for BKA by Dr. [name]. Pending SNF. Assessment & Plan   Right ankle wound/osteomyelitis -Patient placed on vancomycin and Rocephin, failed conservative measures -Dr. [name], orthopedic surgery, consulted and appreciated, status post right BKA  -Continue pain control -Per ortho, may discontinue antibiotics 24hrs post operatively -PT recommended SNF -OT pending -Discussed SNF with son- he agrees.  -Social work consulted  Multiple sclerosis -With baseline bilateral lower extremity paralysis -Continue baclofen, interferon injections every Wednesday -At  baseline, patient has significant difficulties with mobility related to ADLs  Essential hypertension -Continue labetalol, amlodipine, IV hydralazine as needed  Dementia -Continue Aricept  History of trigeminal neuralgia -Continues to complain of facial pain and dental pain -Continue Lyrica and Tegretol -Continue orajel  Chronic microcytic anemia -Hemoglobin appears to be stable, continue to monitor closely  Hypokalemia -Continue to replace and monitor BMP  DVT Prophylaxis  Lovenox  Code Status: Full  Family Communication: None at bedside  Disposition Plan: Admitted. Pending SNF- hopefully on 10/22/2018  Consultants Orthopedic surgery, Dr. [name]  Procedures  Transtibial amputation  Antibiotics   Anti-infectives (From admission, onward)   Start     Dose/Rate Route Frequency Ordered Stop   10/18/18 0600  ceFAZolin (ANCEF) IVPB 2g/100 mL premix     2 g 200 mL/hr over 30 Minutes Intravenous On call to O.R. 10/17/18 1357 10/18/18 0623   10/17/18 1402  ceFAZolin (ANCEF) 2-4 GM/100ML-% IVPB    Note to Pharmacy:  [facility]   : cabinet override      10/17/18 1402 10/17/18 1559   10/14/18 0600  vancomycin (VANCOCIN) IVPB 1000 mg/200 mL premix     1,000 mg 200 mL/hr over 60 Minutes Intravenous Every 24 hours 10/13/18 0925 10/18/18 0654   10/13/18 0600  vancomycin (VANCOCIN) IVPB 750 mg/150 ml premix  Status:  Discontinued     750 mg 150 mL/hr over 60 Minutes Intravenous Every 24 hours 10/12/18 0513 10/13/18 0925   10/12/18 2200  cefTRIAXone (ROCEPHIN) 2 g in sodium chloride 0.9 % 100 mL IVPB     2 g 200 mL/hr over 30 Minutes Intravenous Every 24 hours  10/12/18 0506 10/18/18 2347   10/11/18 2330  vancomycin (VANCOCIN) 1,250 mg in sodium chloride 0.9 % 250 mL IVPB     1,250 mg 166.7 mL/hr over 90 Minutes Intravenous  Once 10/11/18 2305 10/12/18 0252   10/11/18 2315  cefTRIAXone (ROCEPHIN) 2 g in sodium chloride 0.9 % 100 mL IVPB     2 g 200 mL/hr over 30 Minutes  Intravenous  Once 10/11/18 2305 10/12/18 0015      Subjective:   [name] seen and examined today.  Continues to have facial pain, right side.  Denies current chest pain, shortness of breath, abdominal pain, nausea or vomiting, dizziness or headache. Objective:   Vitals:   10/20/18 0543 10/20/18 1311 10/20/18 2207 10/21/18 0554  BP: (!) 156/83 130/60 (!) 164/74 (!) 164/68  Pulse: 90 82 87 83  Resp: 18 17 (!) 22 18  Temp: 99 F (37.2 C) (!) 97.5 F (36.4 C) 99.4 F (37.4 C) 98.2 F (36.8 C)  TempSrc: Oral Oral Oral Oral  SpO2: 97% 96% 100% 95%  Weight:      Height:        Intake/Output Summary (Last 24 hours) at 10/21/2018 1104 Last data filed at 10/21/2018 0919 Gross per 24 hour  Intake 1158.29 ml  Output 875 ml  Net 283.29 ml   Filed Weights   10/13/18 0354 10/17/18 1359  Weight: 86.2 kg 86.2 kg   Exam  General: Well developed, well nourished, NAD, appears stated age  HEENT: NCAT, mucous membranes moist.  Poor dentition  Neck: Supple  Cardiovascular: S1 S2 auscultated, no rubs, murmurs or gallops. Regular rate and rhythm.  Respiratory: Clear to auscultation bilaterally with equal chest rise  Abdomen: Soft, nontender, nondistended, + bowel sounds  Extremities: warm dry without cyanosis clubbing or edema of LLE.  Right BKA, wound VAC in place   Neuro: AAOx2, nonfocal  Psych: Pleasant  Data Reviewed: I have personally reviewed following labs and imaging studies  CBC: Recent Labs  Lab 10/16/18 0414 10/18/18 0335 10/19/18 0528  WBC 6.2 8.1  --   HGB 9.9* 9.3* 9.4*  HCT 31.1* 28.4* 29.9*  MCV 95.1 94.7  --   PLT 219 244  --    Basic Metabolic Panel: Recent Labs  Lab 10/15/18 0154 10/16/18 0414 10/18/18 0335 10/19/18 0528 10/21/18 0319  NA 139 140 139 138 138  K 3.3* 3.9 3.3* 3.6 3.3*  CL 107 107 105 104 102  CO2 22 23 26 25 28   GLUCOSE 97 94 102* 96 93  BUN 5* 7* 7* 8 6*  CREATININE 0.70 0.72 0.68 0.70 0.59  CALCIUM 8.6* 8.7* 8.5*  8.4* 8.5*   GFR: Estimated Creatinine Clearance: 68.1 mL/min (by C-G formula based on SCr of 0.59 mg/dL). Liver Function Tests: No results for input(s): AST, ALT, ALKPHOS, BILITOT, PROT, ALBUMIN in the last 168 hours. No results for input(s): LIPASE, AMYLASE in the last 168 hours. No results for input(s): AMMONIA in the last 168 hours. Coagulation Profile: No results for input(s): INR, PROTIME in the last 168 hours. Cardiac Enzymes: No results for input(s): CKTOTAL, CKMB, CKMBINDEX, TROPONINI in the last 168 hours. BNP (last 3 results) No results for input(s): PROBNP in the last 8760 hours. HbA1C: No results for input(s): HGBA1C in the last 72 hours. CBG: No results for input(s): GLUCAP in the last 168 hours. Lipid Profile: No results for input(s): CHOL, HDL, LDLCALC, TRIG, CHOLHDL, LDLDIRECT in the last 72 hours. Thyroid Function Tests: No results for input(s): TSH,  T4TOTAL, FREET4, T3FREE, THYROIDAB in the last 72 hours. Anemia Panel: No results for input(s): VITAMINB12, FOLATE, FERRITIN, TIBC, IRON, RETICCTPCT in the last 72 hours. Urine analysis:    Component Value Date/Time   COLORURINE ORANGE (A) 09/28/2011 1846   APPEARANCEUR Cloudy (A) 02/04/2014 1313   LABSPEC 1.036 (H) 09/28/2011 1846   PHURINE 6.5 09/28/2011 1846   GLUCOSEU Negative 02/04/2014 1313   HGBUR MODERATE (A) 09/28/2011 1846   BILIRUBINUR Negative 02/04/2014 1313   KETONESUR 15 (A) 09/28/2011 1846   PROTEINUR Negative 02/04/2014 1313   PROTEINUR 100 (A) 09/28/2011 1846   UROBILINOGEN 2.0 (H) 09/28/2011 1846   NITRITE Negative 02/04/2014 1313   NITRITE POSITIVE (A) 09/28/2011 1846   LEUKOCYTESUR Negative 02/04/2014 1313   Sepsis Labs: @LABRCNTIP (procalcitonin:4,lacticidven:4)  ) Recent Results (from the past 240 hour(s))  Culture, blood (Routine x 2)     Status: None   Collection Time: 10/11/18  3:24 PM  Result Value Ref Range Status   Specimen Description BLOOD LEFT WRIST  Final   Special  Requests   Final    BOTTLES DRAWN AEROBIC AND ANAEROBIC Blood Culture adequate volume   Culture   Final    NO GROWTH 5 DAYS Performed at [facility], [place] [address], [place], [place] [address]    Report Status 10/16/2018 FINAL  Final  Culture, blood (Routine x 2)     Status: None   Collection Time: 10/11/18  3:39 PM  Result Value Ref Range Status   Specimen Description BLOOD LEFT HAND  Final   Special Requests   Final    BOTTLES DRAWN AEROBIC ONLY Blood Culture results may not be optimal due to an inadequate volume of blood received in culture bottles   Culture   Final    NO GROWTH 5 DAYS Performed at [facility], [place] [address], [place], [place] [address]    Report Status 10/16/2018 FINAL  Final  Surgical PCR screen     Status: Abnormal   Collection Time: 10/17/18  5:12 AM  Result Value Ref Range Status   MRSA, PCR NEGATIVE NEGATIVE Final   Staphylococcus aureus POSITIVE (A) NEGATIVE Final    Comment: (NOTE) The Xpert SA Assay (FDA approved for NASAL specimens in patients 7 years of age and older), is one component of a comprehensive surveillance program. It is not intended to diagnose infection nor to guide or monitor treatment. Performed at [facility], [place] [address], [place], [place] [address]       Radiology Studies: No results found.   Scheduled Meds: . amLODipine  10 mg Oral Daily  . atorvastatin  40 mg Oral Daily  . baclofen  10 mg Oral TID  . buPROPion  300 mg Oral Daily  . carbamazepine  300 mg Oral Q breakfast   And  . carbamazepine  200 mg Oral BID  . docusate sodium  100 mg Oral BID  . donepezil  5 mg Oral Daily  . enoxaparin (LOVENOX) injection  40 mg Subcutaneous Daily  . fesoterodine  4 mg Oral Daily  . Interferon Beta-1a  30 mcg Intramuscular Weekly  . labetalol  100 mg Oral BID  . liver oil-zinc oxide   Topical QID  . potassium chloride  40 mEq Oral Daily  . pregabalin  150 mg Oral BID  . sertraline  75 mg Oral Daily    Continuous Infusions: . sodium chloride 10 mL/hr at 10/21/18 0400  . lactated ringers 10 mL/hr at 10/17/18  1408  . methocarbamol (ROBAXIN) IV       LOS: 9 days   Time Spent in minutes   30 minutes  Lakiesha Ralphs D.O. on 10/21/2018 at 11:04 AM  Between 7am to 7pm - Please see pager noted on [email or web address]  After 7pm go to [email or web address]  And look for the night coverage person covering for me after hours  [facility] Office  [phone]

## 2018-10-22 LAB — BASIC METABOLIC PANEL
ANION GAP: 9 (ref 5–15)
BUN: 8 mg/dL (ref 8–23)
CO2: 27 mmol/L (ref 22–32)
Calcium: 8.6 mg/dL — ABNORMAL LOW (ref 8.9–10.3)
Chloride: 102 mmol/L (ref 98–111)
Creatinine, Ser: 0.64 mg/dL (ref 0.44–1.00)
GFR calc Af Amer: >60 mL/min (ref >60)
GFR calc non Af Amer: >60 mL/min (ref >60)
Glucose, Bld: 96 mg/dL (ref 70–99)
Potassium: 3.7 mmol/L (ref 3.5–5.1)
Sodium: 138 mmol/L (ref 135–145)

## 2018-10-22 LAB — HEMOGLOBIN AND HEMATOCRIT, BLOOD
HCT: 30.2 % — ABNORMAL LOW (ref 36.0–46.0)
Hemoglobin: 9.4 g/dL — ABNORMAL LOW (ref 12.0–15.0)

## 2018-10-22 MED ORDER — MUPIROCIN 2 % EX OINT
TOPICAL_OINTMENT | Freq: Two times a day (BID) | CUTANEOUS | Status: DC
  Administered 2018-10-22 – 2018-10-25 (×6): via NASAL
  Filled 2018-10-22: qty 22

## 2018-10-22 NOTE — Progress Notes (Signed)
Patient ID: [name], female   DOB: 28-Oct-1945, 73 y.o.   MRN: [id number] Patient is status post transtibial amputation.  The wound VAC is functioning well no drainage.  Plan for discharge to skilled nursing and continue the wound VAC for 1 week.

## 2018-10-22 NOTE — Social Work (Signed)
Spoke with pt son on phone, pt son had still no visited either facility of choice: [facility] or [facility]. CSW discussed that pt is medically ready to make the move to SNF and that we are solely waiting on his decision. Pt son selects [facility] he states due to ratings and proximity to their home.   CSW called Freda Munro with admissions at [facility], sent therapy notes and fl2 and requested insurance authorization be initiated by facility as pt is managed by regular [facility] and not [facility].  CSW continuing to follow for support with disposition when insurance approval received.  Westley Hummer, MSW, [place] Work [phone]

## 2018-10-22 NOTE — Progress Notes (Signed)
PROGRESS NOTE    [name]  OEV:[id number] DOB: July 25, 1946 DOA: 10/11/2018 PCP: [name], MD (Inactive)   Brief Narrative:  HPI on 10/12/2018 by Dr. Fuller Plan [name] is a 73 y.o. female with medical history significant of severe MS, HTN, trigeminal neuralgia, gait disturbance, dementia, DVT; who presents with complaints of nonhealing right ankle wound.  Which of history comes from the patient's son is her primary caregiver.  At baseline patient is fairly debilitated needs assistance with transfers.  2 weeks ago she hit her right foot on a wooden door frame while trying to transfer and sustained a cut.  Son notes that they dressed the wound with a bandage.  They initially thought that the wound was healing up until last week when they noticed foul smelling drainage coming from the wound.  Since that time he has noted that the wound was not healing and it has turned black.  Trying to get her into her primary care provider, but they were all booked up.  The son noted that she was intermittently more confused than normal and questioned if it was related to the wound.  Denies any reports of fever, cough, shortness of breath, chest pain, nausea, vomiting, diarrhea, or dysuria symptoms.  Patient denies any complaints currently at this time.  Interim history Patient admitted with right ankle and foot ulcer and osteomyelitis, now s/p for BKA by Dr. [name]. Pending SNF. Assessment & Plan   Right ankle wound/osteomyelitis -Patient placed on vancomycin and Rocephin, failed conservative measures -Dr. [name], orthopedic surgery, consulted and appreciated, status post right BKA. Wound VAC in place, continue for 1 week -Continue pain control -Per ortho, may discontinue antibiotics 24hrs post operatively -PT/OT recommended SNF -Discussed SNF with son- he agrees.  -Social work consulted  Multiple sclerosis -With baseline bilateral lower extremity paralysis -Continue baclofen, interferon  injections every Wednesday -At baseline, patient has significant difficulties with mobility related to ADLs  Essential hypertension -Continue labetalol, amlodipine, IV hydralazine as needed  Dementia -Continue Aricept  History of trigeminal neuralgia -Continues to complain of facial pain and dental pain -Continue Lyrica and Tegretol -Continue orajel  Chronic microcytic anemia -Hemoglobin appears to be stable, continue to monitor closely  Hypokalemia -Continue to replace and monitor BMP  DVT Prophylaxis  Lovenox  Code Status: Full  Family Communication: None at bedside  Disposition Plan: Admitted. Pending SNF  Consultants Orthopedic surgery, Dr. [name]  Procedures  Transtibial amputation  Antibiotics   Anti-infectives (From admission, onward)   Start     Dose/Rate Route Frequency Ordered Stop   10/18/18 0600  ceFAZolin (ANCEF) IVPB 2g/100 mL premix     2 g 200 mL/hr over 30 Minutes Intravenous On call to O.R. 10/17/18 1357 10/18/18 0623   10/17/18 1402  ceFAZolin (ANCEF) 2-4 GM/100ML-% IVPB    Note to Pharmacy:  [facility]   : cabinet override      10/17/18 1402 10/17/18 1559   10/14/18 0600  vancomycin (VANCOCIN) IVPB 1000 mg/200 mL premix     1,000 mg 200 mL/hr over 60 Minutes Intravenous Every 24 hours 10/13/18 0925 10/18/18 0654   10/13/18 0600  vancomycin (VANCOCIN) IVPB 750 mg/150 ml premix  Status:  Discontinued     750 mg 150 mL/hr over 60 Minutes Intravenous Every 24 hours 10/12/18 0513 10/13/18 0925   10/12/18 2200  cefTRIAXone (ROCEPHIN) 2 g in sodium chloride 0.9 % 100 mL IVPB     2 g 200 mL/hr over 30 Minutes Intravenous Every  24 hours 10/12/18 0506 10/18/18 2347   10/11/18 2330  vancomycin (VANCOCIN) 1,250 mg in sodium chloride 0.9 % 250 mL IVPB     1,250 mg 166.7 mL/hr over 90 Minutes Intravenous  Once 10/11/18 2305 10/12/18 0252   10/11/18 2315  cefTRIAXone (ROCEPHIN) 2 g in sodium chloride 0.9 % 100 mL IVPB     2 g 200 mL/hr over 30  Minutes Intravenous  Once 10/11/18 2305 10/12/18 0015      Subjective:   [name] seen and examined today.  Continues to complains of tooth pain. Denies chest pain, shortness of breath, abdominal pain, nausea, vomiting, diarrhea, constipation.  Objective:   Vitals:   10/21/18 1339 10/21/18 2122 10/22/18 0437 10/22/18 1043  BP: 129/62 (!) 165/74 127/63 (!) 141/64  Pulse: 92 86 81 79  Resp:  16 18   Temp: 100 F (37.8 C) 98.8 F (37.1 C) 98.8 F (37.1 C)   TempSrc: Oral Oral Oral   SpO2: 94% 98% 97%   Weight:      Height:        Intake/Output Summary (Last 24 hours) at 10/22/2018 1139 Last data filed at 10/22/2018 0700 Gross per 24 hour  Intake 956.24 ml  Output 1000 ml  Net -43.76 ml   Filed Weights   10/13/18 0354 10/17/18 1359  Weight: 86.2 kg 86.2 kg   Exam  General: Well developed, well nourished, NAD, appears stated age  HEENT: NCAT,  mucous membranes moist.   Neck: Supple  Cardiovascular: S1 S2 auscultated, RRR, no murmur  Respiratory: Clear to auscultation bilaterally with equal chest rise  Abdomen: Soft, nontender, nondistended, + bowel sounds  Extremities: warm dry without cyanosis clubbing or edema of LLE. R BKA with wound VAC  Neuro: AAOx2, nonfocal  Psych: Appropriate mood and affect  Data Reviewed: I have personally reviewed following labs and imaging studies  CBC: Recent Labs  Lab 10/16/18 0414 10/18/18 0335 10/19/18 0528 10/22/18 0428  WBC 6.2 8.1  --   --   HGB 9.9* 9.3* 9.4* 9.4*  HCT 31.1* 28.4* 29.9* 30.2*  MCV 95.1 94.7  --   --   PLT 219 244  --   --    Basic Metabolic Panel: Recent Labs  Lab 10/16/18 0414 10/18/18 0335 10/19/18 0528 10/21/18 0319 10/22/18 0428  NA 140 139 138 138 138  K 3.9 3.3* 3.6 3.3* 3.7  CL 107 105 104 102 102  CO2 23 26 25 28 27   GLUCOSE 94 102* 96 93 96  BUN 7* 7* 8 6* 8  CREATININE 0.72 0.68 0.70 0.59 0.64  CALCIUM 8.7* 8.5* 8.4* 8.5* 8.6*   GFR: Estimated Creatinine Clearance:  68.1 mL/min (by C-G formula based on SCr of 0.64 mg/dL). Liver Function Tests: No results for input(s): AST, ALT, ALKPHOS, BILITOT, PROT, ALBUMIN in the last 168 hours. No results for input(s): LIPASE, AMYLASE in the last 168 hours. No results for input(s): AMMONIA in the last 168 hours. Coagulation Profile: No results for input(s): INR, PROTIME in the last 168 hours. Cardiac Enzymes: No results for input(s): CKTOTAL, CKMB, CKMBINDEX, TROPONINI in the last 168 hours. BNP (last 3 results) No results for input(s): PROBNP in the last 8760 hours. HbA1C: No results for input(s): HGBA1C in the last 72 hours. CBG: No results for input(s): GLUCAP in the last 168 hours. Lipid Profile: No results for input(s): CHOL, HDL, LDLCALC, TRIG, CHOLHDL, LDLDIRECT in the last 72 hours. Thyroid Function Tests: No results for input(s): TSH, T4TOTAL, FREET4,  T3FREE, THYROIDAB in the last 72 hours. Anemia Panel: No results for input(s): VITAMINB12, FOLATE, FERRITIN, TIBC, IRON, RETICCTPCT in the last 72 hours. Urine analysis:    Component Value Date/Time   COLORURINE ORANGE (A) 09/28/2011 1846   APPEARANCEUR Cloudy (A) 02/04/2014 1313   LABSPEC 1.036 (H) 09/28/2011 1846   PHURINE 6.5 09/28/2011 1846   GLUCOSEU Negative 02/04/2014 1313   HGBUR MODERATE (A) 09/28/2011 1846   BILIRUBINUR Negative 02/04/2014 1313   KETONESUR 15 (A) 09/28/2011 1846   PROTEINUR Negative 02/04/2014 1313   PROTEINUR 100 (A) 09/28/2011 1846   UROBILINOGEN 2.0 (H) 09/28/2011 1846   NITRITE Negative 02/04/2014 1313   NITRITE POSITIVE (A) 09/28/2011 1846   LEUKOCYTESUR Negative 02/04/2014 1313   Sepsis Labs: @LABRCNTIP (procalcitonin:4,lacticidven:4)  ) Recent Results (from the past 240 hour(s))  Surgical PCR screen     Status: Abnormal   Collection Time: 10/17/18  5:12 AM  Result Value Ref Range Status   MRSA, PCR NEGATIVE NEGATIVE Final   Staphylococcus aureus POSITIVE (A) NEGATIVE Final    Comment: (NOTE) The Xpert  SA Assay (FDA approved for NASAL specimens in patients 67 years of age and older), is one component of a comprehensive surveillance program. It is not intended to diagnose infection nor to guide or monitor treatment. Performed at [facility], [place] [address] [place], [place] [address]       Radiology Studies: No results found.   Scheduled Meds: . amLODipine  10 mg Oral Daily  . atorvastatin  40 mg Oral Daily  . baclofen  10 mg Oral TID  . buPROPion  300 mg Oral Daily  . carbamazepine  300 mg Oral Q breakfast   And  . carbamazepine  200 mg Oral BID  . docusate sodium  100 mg Oral BID  . donepezil  5 mg Oral Daily  . enoxaparin (LOVENOX) injection  40 mg Subcutaneous Daily  . fesoterodine  4 mg Oral Daily  . Interferon Beta-1a  30 mcg Intramuscular Weekly  . labetalol  100 mg Oral BID  . liver oil-zinc oxide   Topical QID  . potassium chloride  40 mEq Oral Daily  . pregabalin  150 mg Oral BID  . sertraline  75 mg Oral Daily   Continuous Infusions: . sodium chloride Stopped (10/22/18 0431)  . lactated ringers 10 mL/hr at 10/17/18 1408  . methocarbamol (ROBAXIN) IV       LOS: 10 days   Time Spent in minutes   30 minutes  Christophe Rising D.O. on 10/22/2018 at 11:39 AM  Between 7am to 7pm - Please see pager noted on [email or web address]  After 7pm go to [email or web address]  And look for the night coverage person covering for me after hours  [facility] Office  [phone]

## 2018-10-22 NOTE — Progress Notes (Signed)
Physical Therapy Treatment Patient Details Name: [name] MRN: [id number] DOB: 1946-10-03 Today's Date: 10/22/2018    History of Present Illness Patient is a 73 y/o female presenting to the ED on 10/12/18 due to R ankle wound. Past medical history significant of severe MS, HTN, trigeminal neuralgia, gait disturbance, dementia, DVT. S/p R Transtibial amputation and Application of Prevena wound VAC on 10/17/18.     PT Comments    Continuing work on functional mobility and activity tolerance;  Noting improvements in sitting balance -- able to sit EOB with close guard for safety, but for brief moments without physical assist; Extensor tone patterns kick in with effortful movements, making lateral scooting difficult; Recommend Maximove for safe OOB transfers at this time   Follow Up Recommendations  SNF;Supervision/Assistance - 24 hour     Equipment Recommendations  Other (comment)(Will defer to SNF)    Recommendations for Other Services       Precautions / Restrictions Precautions Precautions: Fall Restrictions Weight Bearing Restrictions: Yes RLE Weight Bearing: Non weight bearing(BKA)    Mobility  Bed Mobility Overal bed mobility: Needs Assistance Bed Mobility: Rolling;Sidelying to Sit;Sit to Supine Rolling: Max assist Sidelying to sit: +2 for physical assistance;Max assist Supine to sit: Max assist;HOB elevated;+2 for physical assistance Sit to supine: Max assist;+2 for physical assistance   General bed mobility comments: Max assis to roll R and L; cues to initiate with overall flexion patterns of hip and trunk, and hand over hand assist to reach across body for rail; noting extensor tone with effortfull movements; Physical cueing to keep hips flexed during transition form sidelying to sit; Max asssit to lay back down -- she tends to brace herself with her LUE behind her -- reinforcing trunk extension patterns  Transfers Overall transfer level: Needs  assistance Equipment used: 2 person hand held assist Transfers: Lateral/Scoot Transfers          Lateral/Scoot Transfers: +2 physical assistance;Total assist General transfer comment: Simulated lateral scoot transfer with a scoot towards [place]; Still showing extension patterns, which make it difficult to unweigh hips for scooting  Ambulation/Gait                 Stairs             Wheelchair Mobility    Modified Rankin (Stroke Patients Only)       Balance Overall balance assessment: Needs assistance Sitting-balance support: Bilateral upper extremity supported;Feet unsupported Sitting balance-Leahy Scale: Poor(Moments sitting EOB of Fair balance) Sitting balance - Comments: Took time EOB to work on sitting balance; level of assist fluctuated between max assist to keep balance, and a few moments when we could scale back assist and she maintained static balance with close guard assist                                    Cognition Arousal/Alertness: Awake/alert Behavior During Therapy: Flat affect Overall Cognitive Status: History of cognitive impairments - at baseline                                        Exercises      General Comments        Pertinent Vitals/Pain Pain Assessment: No/denies pain Faces Pain Scale: Hurts a little bit Pain Location: Noted grimace with movement Pain Descriptors / Indicators: Grimacing  Pain Intervention(s): Monitored during session    Home Living Family/patient expects to be discharged to:: Skilled nursing facility Living Arrangements: Children Available Help at Discharge: Family Type of Home: House Home Access: Ramped entrance   Home Layout: One level Home Equipment: Environmental consultant - 2 wheels;Tub bench;Grab bars - tub/shower;Cane - single point;Walker - 4 wheels;Bedside commode;Wheelchair - manual      Prior Function Level of Independence: Needs assistance  Gait / Transfers Assistance Needed:  would perform transfers and few steps with assist of family ADL's / Homemaking Assistance Needed: required assist for ADLs due to dementia Comments: family not in room to compare   PT Goals (current goals can now be found in the care plan section) Acute Rehab PT Goals Patient Stated Goal: Did not sepcifically state, but agreeable to work  PT Goal Formulation: With patient Time For Goal Achievement: 11/01/18 Potential to Achieve Goals: Fair Progress towards PT goals: Progressing toward goals    Frequency    Min 2X/week      PT Plan Current plan remains appropriate    Co-evaluation              AM-PAC PT "6 Clicks" Mobility   Outcome Measure  Help needed turning from your back to your side while in a flat bed without using bedrails?: A Lot Help needed moving from lying on your back to sitting on the side of a flat bed without using bedrails?: A Lot Help needed moving to and from a bed to a chair (including a wheelchair)?: Total Help needed standing up from a chair using your arms (e.g., wheelchair or bedside chair)?: Total Help needed to walk in hospital room?: Total Help needed climbing 3-5 steps with a railing? : Total 6 Click Score: 8    End of Session   Activity Tolerance: Patient tolerated treatment well Patient left: in bed;with call bell/phone within reach;with bed alarm set(bed in chair position) Nurse Communication: Mobility status;Need for lift equipment PT Visit Diagnosis: Unsteadiness on feet (R26.81);Other abnormalities of gait and mobility (R26.89);Muscle weakness (generalized) (M62.81)     Time: 6122-4497 PT Time Calculation (min) (ACUTE ONLY): 21 min  Charges:  $Therapeutic Activity: 8-22 mins                     Roney Marion, PT  Acute Rehabilitation Services Pager [phone] Office [place] 10/22/2018, 11:01 AM

## 2018-10-22 NOTE — Evaluation (Signed)
Occupational Therapy Evaluation Patient Details Name: [name] MRN: [id number] DOB: 1946/05/25 Today's Date: 10/22/2018    History of Present Illness Patient is a 73 y/o female presenting to the ED on 10/12/18 due to R ankle wound. Past medical history significant of severe MS, HTN, trigeminal neuralgia, gait disturbance, dementia, DVT. S/p R Transtibial amputation and Application of Prevena wound VAC on 10/17/18.    Clinical Impression   Pt is a 73 yo female s/p above dx. Pt currently with functional limitiations due to the deficits listed below (see OT problem list). Pt PTA: pt performing ADLs with assist from family as pt with dementia at baseline. Pt 2+ for transfers prior to recent BKA. Pt currently, maxA +2 for safe bed mobility and sitting EOB with zero to poor balance. Pt's OT session limited by R jaw pain (trigeminal neuralgia in PMHx). Pt to use maximove with staff for transfers. Pt attempting set-upA for feeding still requiring minA for sequencing. Pt will benefit from skilled OT to increase their independence and safety with adls and balance to allow discharge to SNF.     Follow Up Recommendations  SNF;Supervision/Assistance - 24 hour    Equipment Recommendations  None recommended by OT    Recommendations for Other Services       Precautions / Restrictions Precautions Precautions: Fall Restrictions Weight Bearing Restrictions: Yes RLE Weight Bearing: Non weight bearing      Mobility Bed Mobility Overal bed mobility: Needs Assistance Bed Mobility: Rolling;Supine to Sit;Sit to Supine Rolling: Max assist;+2 for physical assistance(Did not use +2, but pt requires +2)   Supine to sit: Max assist;HOB elevated;+2 for physical assistance Sit to supine: Max assist;+2 for physical assistance   General bed mobility comments: Max A for rolling to L side with sequencing cueing for hand placement; pt not tolerating well due to jaw pain.  Transfers Overall transfer level:  Needs assistance               General transfer comment: deferred- pt not tolerating EOB.    Balance Overall balance assessment: Needs assistance Sitting-balance support: Bilateral upper extremity supported;Feet unsupported Sitting balance-Leahy Scale: Zero Sitting balance - Comments: Pt unable to hold self up at this time.                                   ADL either performed or assessed with clinical judgement   ADL Overall ADL's : Needs assistance/impaired Eating/Feeding: Set up;Minimal assistance;Cueing for safety;Cueing for sequencing;Bed level   Grooming: Moderate assistance   Upper Body Bathing: Maximal assistance   Lower Body Bathing: Maximal assistance   Upper Body Dressing : Maximal assistance   Lower Body Dressing: Maximal assistance               Functional mobility during ADLs: Maximal assistance;Total assistance;+2 for physical assistance;+2 for safety/equipment(maximove would be required; pt unable to sit EOB due to pain) General ADL Comments: MinA for feeding; MaxA to [place] Vision/History: No visual deficits Vision Assessment?: No apparent visual deficits     Perception     Praxis      Pertinent Vitals/Pain Pain Assessment: Faces Faces Pain Scale: Hurts even more Pain Location: R side of mouth PMHx: trigeminal neuralgia Pain Descriptors / Indicators: Constant;Sharp;Shooting Pain Intervention(s): Limited activity within patient's tolerance;RN gave pain meds during session;Repositioned;Patient requesting pain meds-RN notified     Hand Dominance Right(but used  left throughout session)   Extremity/Trunk Assessment Upper Extremity Assessment Upper Extremity Assessment: Generalized weakness   Lower Extremity Assessment Lower Extremity Assessment: Generalized weakness;RLE deficits/detail RLE Deficits / Details: unable to lift LE from bed against gravity; recent AKA RLE: Unable to fully assess due to  pain   Cervical / Trunk Assessment Cervical / Trunk Assessment: Kyphotic   Communication Communication Communication: HOH   Cognition Arousal/Alertness: Awake/alert Behavior During Therapy: Flat affect Overall Cognitive Status: History of cognitive impairments - at baseline                                     General Comments       Exercises     Shoulder Instructions      Home Living Family/patient expects to be discharged to:: Skilled nursing facility Living Arrangements: Children Available Help at Discharge: Family Type of Home: House Home Access: Ramped entrance     Home Layout: One level     Bathroom Shower/Tub: Teacher, early years/pre: Standard     Home Equipment: Environmental consultant - 2 wheels;Tub bench;Grab bars - tub/shower;Cane - single point;Walker - 4 wheels;Bedside commode;Wheelchair - manual          Prior Functioning/Environment Level of Independence: Needs assistance  Gait / Transfers Assistance Needed: would perform transfers and few steps with assist of family ADL's / Homemaking Assistance Needed: required assist for ADLs due to dementia   Comments: family not in room to compare        OT Problem List: Decreased strength;Decreased activity tolerance;Impaired balance (sitting and/or standing);Decreased coordination;Decreased safety awareness;Pain      OT Treatment/Interventions: Self-care/ADL training;Therapeutic exercise;Therapeutic activities;Energy conservation;DME and/or AE instruction;Patient/family education;Balance training    OT Goals(Current goals can be found in the care plan section) Acute Rehab OT Goals Patient Stated Goal: none stated OT Goal Formulation: With patient Time For Goal Achievement: 11/05/18 Potential to Achieve Goals: Good ADL Goals Pt Will Perform Grooming: (P) with min guard assist;sitting Pt Will Perform Upper Body Dressing: (P) with min assist Pt Will Perform Lower Body Dressing: (P) with max  assist Pt Will Transfer to Toilet: (P) bedside commode;squat pivot transfer;with max assist Additional ADL Goal #1: (P) Pt will performing bed mobility with ModA to sit EOB x5 mins for ADLs.  OT Frequency: Min 2X/week   Barriers to D/C: Inaccessible home environment;Decreased caregiver support          Co-evaluation              AM-PAC OT "6 Clicks" Daily Activity     Outcome Measure Help from another person eating meals?: A Little Help from another person taking care of personal grooming?: A Little Help from another person toileting, which includes using toliet, bedpan, or urinal?: Total Help from another person bathing (including washing, rinsing, drying)?: Total Help from another person to put on and taking off regular upper body clothing?: A Lot Help from another person to put on and taking off regular lower body clothing?: Total 6 Click Score: 11   End of Session Nurse Communication: Mobility status;Need for lift equipment(already aware to use maximove)  Activity Tolerance: Patient limited by pain;Treatment limited secondary to medical complications (Comment) Patient left: in bed;with call bell/phone within reach;with bed alarm set  OT Visit Diagnosis: Unsteadiness on feet (R26.81);Muscle weakness (generalized) (M62.81);Other abnormalities of gait and mobility (R26.89)  Time: 5797-2820 OT Time Calculation (min): 32 min Charges:  OT General Charges $OT Visit: 1 Visit OT Evaluation $OT Eval Moderate Complexity: 1 Mod OT Treatments $Self Care/Home Management : 8-22 mins  Ebony Hail Harold Hedge) Marsa Aris OTR/L Acute Rehabilitation Services Pager: [phone] Office: [phone]  Fredda Hammed 10/22/2018, 10:21 AM

## 2018-10-22 NOTE — Progress Notes (Signed)
Spoke to son about orajel administration for dental pain.  He advised that historically this does not help with the trigeminal neuralgia pain, that the tramadol works the best for the patient on long term.

## 2018-10-23 MED ORDER — WHITE PETROLATUM EX OINT
TOPICAL_OINTMENT | CUTANEOUS | Status: AC
  Administered 2018-10-23: 10:00:00
  Filled 2018-10-23: qty 28.35

## 2018-10-23 NOTE — Progress Notes (Signed)
PROGRESS NOTE    [name]  WRU:[id number] DOB: [birth year] DOA: 10/11/2018 PCP: Meagan Austin, MD (Inactive)   Brief Narrative:  HPI on 10/12/2018 by Dr. Fuller Plan FONTAINE [name] is a 73 y.o. female with medical history significant of severe MS, HTN, trigeminal neuralgia, gait disturbance, dementia, DVT; who presents with complaints of nonhealing right ankle wound.  Which of history comes from the patient's son is her primary caregiver.  At baseline patient is fairly debilitated needs assistance with transfers.  2 weeks ago she hit her right foot on a wooden door frame while trying to transfer and sustained a cut.  Son notes that they dressed the wound with a bandage.  They initially thought that the wound was healing up until last week when they noticed foul smelling drainage coming from the wound.  Since that time he has noted that the wound was not healing and it has turned black.  Trying to get her into her primary care provider, but they were all booked up.  The son noted that she was intermittently more confused than normal and questioned if it was related to the wound.  Denies any reports of fever, cough, shortness of breath, chest pain, nausea, vomiting, diarrhea, or dysuria symptoms.  Patient denies any complaints currently at this time.  Interim history Patient admitted with right ankle and foot ulcer and osteomyelitis, now s/p for BKA by Dr. [name]. Pending SNF. Assessment & Plan   Right ankle wound/osteomyelitis -Patient placed on vancomycin and Rocephin, failed conservative measures -Dr. [name], orthopedic surgery, consulted and appreciated, status post right BKA. Wound VAC in place, continue for 1 week -Continue pain control -Per ortho, may discontinue antibiotics 24hrs post operatively -PT/OT recommended SNF -Discussed SNF with son- he agrees.  -Social work consulted- pending insurance auth  Multiple sclerosis -With baseline bilateral lower extremity  paralysis -Continue baclofen, interferon injections every Wednesday -At baseline, patient has significant difficulties with mobility related to ADLs  Essential hypertension -Continue labetalol, amlodipine, IV hydralazine as needed  Dementia -Continue Aricept  History of trigeminal neuralgia -Continues to complain of facial pain and dental pain -Continue Lyrica and Tegretol -Continue orajel  Chronic microcytic anemia -Hemoglobin appears to be stable, continue to monitor closely  Hypokalemia -Continue to replace and monitor BMP  DVT Prophylaxis  Lovenox  Code Status: Full  Family Communication: None at bedside  Disposition Plan: Admitted. Pending SNF- insurance auth.   Consultants Orthopedic surgery, Dr. [name]  Procedures  Transtibial amputation  Antibiotics   Anti-infectives (From admission, onward)   Start     Dose/Rate Route Frequency Ordered Stop   10/18/18 0600  ceFAZolin (ANCEF) IVPB 2g/100 mL premix     2 g 200 mL/hr over 30 Minutes Intravenous On call to O.R. 10/17/18 1357 10/18/18 0623   10/17/18 1402  ceFAZolin (ANCEF) 2-4 GM/100ML-% IVPB    Note to Pharmacy:  [facility]   : cabinet override      10/17/18 1402 10/17/18 1559   10/14/18 0600  vancomycin (VANCOCIN) IVPB 1000 mg/200 mL premix     1,000 mg 200 mL/hr over 60 Minutes Intravenous Every 24 hours 10/13/18 0925 10/18/18 0654   10/13/18 0600  vancomycin (VANCOCIN) IVPB 750 mg/150 ml premix  Status:  Discontinued     750 mg 150 mL/hr over 60 Minutes Intravenous Every 24 hours 10/12/18 0513 10/13/18 0925   10/12/18 2200  cefTRIAXone (ROCEPHIN) 2 g in sodium chloride 0.9 % 100 mL IVPB     2 g 200  mL/hr over 30 Minutes Intravenous Every 24 hours 10/12/18 0506 10/18/18 2347   10/11/18 2330  vancomycin (VANCOCIN) 1,250 mg in sodium chloride 0.9 % 250 mL IVPB     1,250 mg 166.7 mL/hr over 90 Minutes Intravenous  Once 10/11/18 2305 10/12/18 0252   10/11/18 2315  cefTRIAXone (ROCEPHIN) 2 g in sodium  chloride 0.9 % 100 mL IVPB     2 g 200 mL/hr over 30 Minutes Intravenous  Once 10/11/18 2305 10/12/18 0015      Subjective:   [name] seen and examined today.  Patient feeling better today.  Denies current chest pain, shortness breath, abdominal pain, nausea or vomiting, diarrhea or constipation. Objective:   Vitals:   10/22/18 1424 10/22/18 2127 10/23/18 0455 10/23/18 1411  BP: (!) 93/58 (!) 154/68 130/60 119/62  Pulse: 89 83 79 91  Resp:  (!) 30 20   Temp: 98 F (36.7 C) 98.9 F (37.2 C) 98.3 F (36.8 C) 98.9 F (37.2 C)  TempSrc: Oral Oral Oral Oral  SpO2: 96% 99% 97% 96%  Weight:      Height:        Intake/Output Summary (Last 24 hours) at 10/23/2018 1529 Last data filed at 10/23/2018 1300 Gross per 24 hour  Intake 240 ml  Output -  Net 240 ml   Filed Weights   10/13/18 0354 10/17/18 1359  Weight: 86.2 kg 86.2 kg   Exam  General: Well developed, well nourished, NAD, appears stated age  HEENT: NCAT, mucous membranes moist.   Neck: Supple  Cardiovascular: S1 S2 auscultated, RRR, no murmur.  Respiratory: Clear to auscultation bilaterally with equal chest rise  Abdomen: Soft, nontender, nondistended, + bowel sounds  Extremities: warm dry without cyanosis clubbing. R BKA with wound vac  Neuro: AAOx2, nonfocal  Psych: pleasant, appropriate mood and affect  Data Reviewed: I have personally reviewed following labs and imaging studies  CBC: Recent Labs  Lab 10/18/18 0335 10/19/18 0528 10/22/18 0428  WBC 8.1  --   --   HGB 9.3* 9.4* 9.4*  HCT 28.4* 29.9* 30.2*  MCV 94.7  --   --   PLT 244  --   --    Basic Metabolic Panel: Recent Labs  Lab 10/18/18 0335 10/19/18 0528 10/21/18 0319 10/22/18 0428  NA 139 138 138 138  K 3.3* 3.6 3.3* 3.7  CL 105 104 102 102  CO2 26 25 28 27   GLUCOSE 102* 96 93 96  BUN 7* 8 6* 8  CREATININE 0.68 0.70 0.59 0.64  CALCIUM 8.5* 8.4* 8.5* 8.6*   GFR: Estimated Creatinine Clearance: 68.1 mL/min (by C-G  formula based on SCr of 0.64 mg/dL). Liver Function Tests: No results for input(s): AST, ALT, ALKPHOS, BILITOT, PROT, ALBUMIN in the last 168 hours. No results for input(s): LIPASE, AMYLASE in the last 168 hours. No results for input(s): AMMONIA in the last 168 hours. Coagulation Profile: No results for input(s): INR, PROTIME in the last 168 hours. Cardiac Enzymes: No results for input(s): CKTOTAL, CKMB, CKMBINDEX, TROPONINI in the last 168 hours. BNP (last 3 results) No results for input(s): PROBNP in the last 8760 hours. HbA1C: No results for input(s): HGBA1C in the last 72 hours. CBG: No results for input(s): GLUCAP in the last 168 hours. Lipid Profile: No results for input(s): CHOL, HDL, LDLCALC, TRIG, CHOLHDL, LDLDIRECT in the last 72 hours. Thyroid Function Tests: No results for input(s): TSH, T4TOTAL, FREET4, T3FREE, THYROIDAB in the last 72 hours. Anemia Panel: No results for  input(s): VITAMINB12, FOLATE, FERRITIN, TIBC, IRON, RETICCTPCT in the last 72 hours. Urine analysis:    Component Value Date/Time   COLORURINE ORANGE (A) 09/28/2011 1846   APPEARANCEUR Cloudy (A) 02/04/2014 1313   LABSPEC 1.036 (H) 09/28/2011 1846   PHURINE 6.5 09/28/2011 1846   GLUCOSEU Negative 02/04/2014 1313   HGBUR MODERATE (A) 09/28/2011 1846   BILIRUBINUR Negative 02/04/2014 1313   KETONESUR 15 (A) 09/28/2011 1846   PROTEINUR Negative 02/04/2014 1313   PROTEINUR 100 (A) 09/28/2011 1846   UROBILINOGEN 2.0 (H) 09/28/2011 1846   NITRITE Negative 02/04/2014 1313   NITRITE POSITIVE (A) 09/28/2011 1846   LEUKOCYTESUR Negative 02/04/2014 1313   Sepsis Labs: @LABRCNTIP (procalcitonin:4,lacticidven:4)  ) Recent Results (from the past 240 hour(s))  Surgical PCR screen     Status: Abnormal   Collection Time: 10/17/18  5:12 AM  Result Value Ref Range Status   MRSA, PCR NEGATIVE NEGATIVE Final   Staphylococcus aureus POSITIVE (A) NEGATIVE Final    Comment: (NOTE) The Xpert SA Assay (FDA  approved for NASAL specimens in patients 82 years of age and older), is one component of a comprehensive surveillance program. It is not intended to diagnose infection nor to guide or monitor treatment. Performed at [facility], [place] [address] [place], [place] [address]       Radiology Studies: No results found.   Scheduled Meds: . amLODipine  10 mg Oral Daily  . atorvastatin  40 mg Oral Daily  . baclofen  10 mg Oral TID  . buPROPion  300 mg Oral Daily  . carbamazepine  300 mg Oral Q breakfast   And  . carbamazepine  200 mg Oral BID  . docusate sodium  100 mg Oral BID  . donepezil  5 mg Oral Daily  . enoxaparin (LOVENOX) injection  40 mg Subcutaneous Daily  . fesoterodine  4 mg Oral Daily  . Interferon Beta-1a  30 mcg Intramuscular Weekly  . labetalol  100 mg Oral BID  . liver oil-zinc oxide   Topical QID  . mupirocin ointment   Nasal BID  . potassium chloride  40 mEq Oral Daily  . pregabalin  150 mg Oral BID  . sertraline  75 mg Oral Daily   Continuous Infusions: . sodium chloride Stopped (10/22/18 0431)  . lactated ringers 10 mL/hr at 10/17/18 1408  . methocarbamol (ROBAXIN) IV       LOS: 11 days   Time Spent in minutes   30 minutes  Jaidynn Balster D.O. on 10/23/2018 at 3:29 PM  Between 7am to 7pm - Please see pager noted on [email or web address]  After 7pm go to [email or web address]  And look for the night coverage person covering for me after hours  [facility] Office  [phone]

## 2018-10-23 NOTE — Social Work (Signed)
CSW continuing to follow for placement pending insurance authorization.  Westley Hummer, MSW, [place] Work [phone]

## 2018-10-23 NOTE — Discharge Summary (Signed)
Physician Discharge Summary  [name] LTJ:[id number] DOB: 1946/01/07 DOA: 10/11/2018  PCP: Meagan Austin, MD (Inactive)  Admit date: 10/11/2018 Discharge date: 10/24/2018  Time spent: 45 minutes  Recommendations for Outpatient Follow-up:  Patient will be discharged to skilled nursing facility, continue physical and occupational.  Patient will need to follow up with primary care provider within one week of discharge.  Follow up with Dr. [name], orthopedics. Patient should continue medications as prescribed.  Patient should follow a heart healthy diet.   Discharge Diagnoses:  Principle problem: Right ankle wound/osteomyelitis Multiple sclerosis Essential hypertension Dementia History of trigeminal neuralgia Chronic microcytic anemia Hypokalemia  Discharge Condition: Stable  Diet recommendation: heart healthy  Filed Weights   10/13/18 0354 10/17/18 1359  Weight: 86.2 kg 86.2 kg    History of present illness:  on 10/12/2018 by Dr. Sharol Chen a 73 y.o.femalewith medical history significant of severe MS, HTN, trigeminal neuralgia, gait disturbance, dementia, DVT; who presents with complaints of nonhealing right ankle wound. Which of history comes from the patient's son is her primary caregiver. At baselinepatient is fairly debilitatedneeds assistance with transfers. 2 weeks ago she hit her right foot on a wooden door frame while trying to transfer and sustained a cut. Son notes that they dressed the wound with a bandage. They initially thought that the wound was healing up until last week when they noticed foul smelling drainage coming from the wound. Since that timehe has noted that the wound was not healing and it has turned black. Trying to get her into her primary care provider, but they were all booked up. The son noted that she was intermittently more confused than normal and questioned if it was related to the wound. Denies any reports of fever,  cough, shortness of breath, chest pain, nausea, vomiting, diarrhea, or dysuria symptoms. Patient denies any complaints currently at this time.  Hospital Course:  Right ankle wound/osteomyelitis -Patient placed on vancomycin and Rocephin, failed conservative measures -Dr. [name], orthopedic surgery, consulted and appreciated, status post right BKA. Wound VAC in place, continue for 1 week -Continue pain control -Per ortho, may discontinue antibiotics 24hrs post operatively -PT/OT recommended SNF -Discussed SNF with son- he agrees.  -Social work consulted  Multiple sclerosis -With baseline bilateral lower extremity paralysis -Continue baclofen, interferon injections every Wednesday -At baseline, patient has significant difficulties with mobility related to ADLs  Essential hypertension -Continue labetalol, amlodipine  Dementia -Continue Aricept  History of trigeminal neuralgia -Continues to complain of facial pain and dental pain -Continue Lyrica and Tegretol -Continue orajel PRN  Chronic microcytic anemia -Hemoglobin appears to be stable  Hypokalemia -Resolved  Consultants Orthopedic surgery, Dr. [name]  Procedures  Transtibial amputation  Discharge Exam: Vitals:   10/23/18 2158 10/24/18 0638  BP: (!) 150/74 133/64  Pulse: 84 78  Resp: 19 20  Temp:  98.4 F (36.9 C)  SpO2: 99% 98%     General: Well developed, well nourished, NAD, appears stated age  HEENT: NCAT, mucous membranes moist.  Neck: Supple  Cardiovascular: S1 S2 auscultated, no rubs, murmurs or gallops. Regular rate and rhythm.  Respiratory: Clear to auscultation bilaterally with equal chest rise  Abdomen: Soft, nontender, nondistended, + bowel sounds  Extremities: warm dry without cyanosis clubbing of LLE. R BKA with wound vac  Neuro: AAOx2, nonfocal  Psych: Pleasant, appropriate mood and affect  Discharge Instructions Discharge Instructions    Discharge instructions   Complete by:   As directed    Patient will be  discharged to skilled nursing facility, continue physical and occupational.  Patient will need to follow up with primary care provider within one week of discharge.  Follow up with Dr. [name], orthopedics. Patient should continue medications as prescribed.  Patient should follow a heart healthy diet.   Negative Pressure Wound Therapy - Incisional   Complete by:  As directed      Allergies as of 10/24/2018      Reactions   Prednisone    Had gotten thrush   Shellfish Allergy    Hives      Medication List    STOP taking these medications   dexamethasone 2 MG tablet Commonly known as:  DECADRON   levETIRAcetam 500 MG tablet Commonly known as:  KEPPRA   losartan-hydrochlorothiazide 100-12.5 MG tablet Commonly known as:  HYZAAR     TAKE these medications   acetaminophen 500 MG tablet Commonly known as:  TYLENOL Take 500 mg by mouth every 6 (six) hours as needed for mild pain.   amLODipine 10 MG tablet Commonly known as:  NORVASC Take 1 tablet (10 mg total) by mouth daily.   APPLE CIDER VINEGAR PO Take 1 capsule by mouth daily. 450mg    atorvastatin 40 MG tablet Commonly known as:  LIPITOR Take 40 mg by mouth daily.   B-complex with vitamin C tablet Take 1 tablet by mouth daily.   baclofen 10 MG tablet Commonly known as:  LIORESAL ONE TABLET TWICE DURING THE DAY AND 2 AT NIGHT What changed:  See the new instructions.   buPROPion 300 MG 24 hr tablet Commonly known as:  WELLBUTRIN XL Take 300 mg by mouth daily.   carbamazepine 200 MG tablet Commonly known as:  TEGRETOL TAKE 1 TABLET BY MOUTH 3 TIMES A DAY AND 1/2 TABLET AT BEDTIME AT BEDTIME What changed:  See the new instructions.   clotrimazole 10 MG troche Commonly known as:  MYCELEX Take 1 tablet (10 mg total) by mouth 5 (five) times daily.   [employer] VIT D 5000 HIGH-POTENCY PO Take 5,000 Int'l Units/L by mouth daily.   docusate sodium 100 MG capsule Commonly known as:   COLACE Take 1 capsule (100 mg total) by mouth 2 (two) times daily.   donepezil 5 MG tablet Commonly known as:  ARICEPT TAKE 1 TABLET (5 MG TOTAL) BY MOUTH DAILY.   FIBER SELECT GUMMIES PO Take 5 g by mouth 2 (two) times daily.   fish oil-omega-3 fatty acids 1000 MG capsule Take 2 g by mouth 3 (three) times daily.   hydrALAZINE 10 MG tablet Commonly known as:  APRESOLINE Take 10 mg by mouth 2 (two) times daily.   Interferon Beta-1a 30 MCG/0.5ML Ajkt Commonly known as:  AVONEX PEN Inject 30 mcg into the muscle every 7 (seven) days.   KLOR-CON PO Take 20 mEq by mouth 3 (three) times daily.   labetalol 100 MG tablet Commonly known as:  NORMODYNE Take 100 mg by mouth 2 (two) times daily.   multivitamins ther. w/minerals Tabs tablet Take 1 tablet by mouth daily.   oxyCODONE 5 MG immediate release tablet Commonly known as:  Oxy IR/ROXICODONE Take 1 tablet (5 mg total) by mouth every 4 (four) hours as needed for moderate pain (pain score 4-6).   petrolatum-hydrophilic-aloe vera ointment Apply topically 2 (two) times daily as needed for wound care. What changed:  Another medication with the same name was changed. Make sure you understand how and when to take each.   white petrolatum-zinc oxide cream Apply  topically as needed. What changed:    how much to take  when to take this  reasons to take this   pregabalin 150 MG capsule Commonly known as:  LYRICA Take 1 capsule (150 mg total) by mouth 2 (two) times daily.   sertraline 50 MG tablet Commonly known as:  ZOLOFT Take 75 mg by mouth daily. One and one half tablet once a day in morning.   tolterodine 2 MG 24 hr capsule Commonly known as:  DETROL LA TAKE 1 CAPSULE BY MOUTH EVERYDAY AT BEDTIME What changed:  See the new instructions.   traMADol 50 MG tablet Commonly known as:  ULTRAM Take 1 tablet (50 mg total) by mouth every 6 (six) hours as needed for moderate pain. What changed:  See the new instructions.       Allergies  Allergen Reactions  . Prednisone     Had gotten thrush  . Shellfish Allergy     Hives     Contact information for follow-up providers    [name], MD In 1 week.   Specialty:  Orthopedic Surgery Contact information: [place] [address] [phone]        Meagan Austin, MD. Schedule an appointment as soon as possible for a visit in 1 week(s).   Specialty:  Family Medicine Why:  Hospital follow up Contact information: [place] [address] [phone]            Contact information for after-discharge care    [facility] .   Service:  Skilled Nursing Contact information: [place] [phone]                   The results of significant diagnostics from this hospitalization (including imaging, microbiology, ancillary and laboratory) are listed below for reference.    Significant Diagnostic Studies: Dg Tibia/fibula Right  Result Date: 10/11/2018 CLINICAL DATA:  Ankle wound. Struck foot on wooden door 2 weeks ago. Redness around wound spreading up leg. EXAM: RIGHT TIBIA AND FIBULA - 2 VIEW COMPARISON:  Concurrent ankle in foot radiographs. FINDINGS: Soft tissue defect about the lateral ankle, better assessed on concurrent ankle radiographs. Diffuse soft tissue edema of the lower leg. No fracture, dislocation, or bony destructive change. Degenerative change of the knee is partially included. IMPRESSION: 1. Soft tissue defect/wound about the lateral ankle, better assessed on concurrent ankle radiographs. Diffuse soft tissue edema of the lower leg. 2. No acute osseous abnormality. Electronically Signed   By: Keith Rake M.D.   On: 10/11/2018 23:06   Dg Ankle Complete Right  Result Date: 10/11/2018 CLINICAL DATA:  Ankle wound. Struck foot on wooden door 2 weeks ago. Redness around wound spreading up leg. EXAM: RIGHT  ANKLE - COMPLETE 3+ VIEW COMPARISON:  None. FINDINGS: Soft tissue defect about the lateral ankle. No tracking soft tissue air. Diffuse soft tissue edema about the ankle. No bony destructive change, periosteal reaction, fracture or dislocation. The ankle mortise is preserved. IMPRESSION: Soft tissue defect about the lateral ankle with diffuse soft tissue edema. No tracking soft tissue air. No radiographic findings of osteomyelitis or acute osseous abnormality. Electronically Signed   By: Keith Rake M.D.   On: 10/11/2018 23:07   Mr Ankle Right W Wo Contrast  Result Date: 10/12/2018 CLINICAL DATA:  Nonhealing lateral ankle wound following injury 2 weeks ago. Increasing lower extremity erythema. EXAM: MRI OF THE  RIGHT ANKLE WITHOUT AND WITH CONTRAST TECHNIQUE: Multiplanar, multisequence MR imaging of the ankle was performed before and after the administration of intravenous contrast. CONTRAST:  8 cc Gadavist COMPARISON:  Radiographs 10/11/2018. FINDINGS: TENDONS Peroneal: Intact and normally positioned. No significant tenosynovitis or abnormal enhancement. Posteromedial: Intact and normally positioned. Anterior: Intact and normally positioned. Achilles: Intact. Plantar Fascia: Intact. LIGAMENTS Lateral: The anterior and posterior talofibular and calcaneofibular ligaments are intact. Medial: The deltoid and visualized portions of the spring ligament appear intact. CARTILAGE AND BONES Ankle Joint: No significant ankle joint effusion or abnormal synovial enhancement. Mild tibiotalar degenerative changes. Subtalar Joints/Sinus Tarsi: Unremarkable. Bones: There is a large skin defect lateral to the lateral malleolus with an underlying large area of increased T2 signal and absent enhancement in the subjacent soft tissues. The area of absent enhancement measures 2.8 x 2.4 x 1.1 cm and abuts the lateral malleolus. There is an underlying small subperiosteal abscess measuring up to 17 mm on coronal image 19/5. This is  associated with cortical destruction of the distal fibula laterally, underlying marrow edema and enhancement. No other suspicious marrow lesions. Other: In addition to the focal ulcer lateral to the lateral malleolus, there is generalized subcutaneous edema in the lower leg, extending into the dorsum of the foot. No other focal fluid collections are identified. IMPRESSION: IMPRESSION 1. Large soft tissue ulcer lateral to the lateral malleolus with underlying soft tissue necrosis and a small subperiosteal abscess of the distal fibula. 2. Mild osteomyelitis of the lateral malleolus. No evidence of septic joint. 3. Generalized soft tissue edema consistent with cellulitis. No focal fluid collections. 4. Intact ankle tendons and ligaments. 5. Preliminary results of this case were discussed between Dr. Radene Knee and Dr. Tamala Julian at 0416 hours on 10/12/2018. Electronically Signed   By: Richardean Sale M.D.   On: 10/12/2018 07:49   Dg Foot Complete Right  Result Date: 10/11/2018 CLINICAL DATA:  Ankle wound. Struck foot on wooden door 2 weeks ago. Redness around wound spreading up leg. EXAM: RIGHT FOOT COMPLETE - 3+ VIEW COMPARISON:  Concurrent ankle radiographs. FINDINGS: Soft tissue defect about the lateral ankle better assessed on concurrent ankle exam. Diffuse soft tissue edema about the foot. No fracture, dislocation, or bony destructive change. Scattered osteoarthritis in the midfoot and about the digits. IMPRESSION: Soft tissue defect about the lateral ankle better assessed on concurrent ankle radiographs. Diffuse soft tissue edema of the foot. No acute osseous abnormality. Electronically Signed   By: Keith Rake M.D.   On: 10/11/2018 23:08    Microbiology: Recent Results (from the past 240 hour(s))  Surgical PCR screen     Status: Abnormal   Collection Time: 10/17/18  5:12 AM  Result Value Ref Range Status   MRSA, PCR NEGATIVE NEGATIVE Final   Staphylococcus aureus POSITIVE (A) NEGATIVE Final    Comment:  (NOTE) The Xpert SA Assay (FDA approved for NASAL specimens in patients 34 years of age and older), is one component of a comprehensive surveillance program. It is not intended to diagnose infection nor to guide or monitor treatment. Performed at [facility], [place] [address], [place], [place] [address]      Labs: Basic Metabolic Panel: Recent Labs  Lab 10/18/18 0335 10/19/18 0528 10/21/18 0319 10/22/18 0428 10/24/18 0302  NA 139 138 138 138 140  K 3.3* 3.6 3.3* 3.7 4.2  CL 105 104 102 102 104  CO2 26 25 28 27 27   GLUCOSE 102* 96 93 96 91  BUN 7*  8 6* 8 10  CREATININE 0.68 0.70 0.59 0.64 0.79  CALCIUM 8.5* 8.4* 8.5* 8.6* 8.7*   Liver Function Tests: No results for input(s): AST, ALT, ALKPHOS, BILITOT, PROT, ALBUMIN in the last 168 hours. No results for input(s): LIPASE, AMYLASE in the last 168 hours. No results for input(s): AMMONIA in the last 168 hours. CBC: Recent Labs  Lab 10/18/18 0335 10/19/18 0528 10/22/18 0428  WBC 8.1  --   --   HGB 9.3* 9.4* 9.4*  HCT 28.4* 29.9* 30.2*  MCV 94.7  --   --   PLT 244  --   --    Cardiac Enzymes: No results for input(s): CKTOTAL, CKMB, CKMBINDEX, TROPONINI in the last 168 hours. BNP: BNP (last 3 results) No results for input(s): BNP in the last 8760 hours.  ProBNP (last 3 results) No results for input(s): PROBNP in the last 8760 hours.  CBG: No results for input(s): GLUCAP in the last 168 hours.     Signed:  Cristal Ford  [facility] 10/24/2018, 10:14 AM

## 2018-10-24 LAB — BASIC METABOLIC PANEL
ANION GAP: 9 (ref 5–15)
BUN: 10 mg/dL (ref 8–23)
CALCIUM: 8.7 mg/dL — AB (ref 8.9–10.3)
CO2: 27 mmol/L (ref 22–32)
Chloride: 104 mmol/L (ref 98–111)
Creatinine, Ser: 0.79 mg/dL (ref 0.44–1.00)
GFR calc non Af Amer: >60 mL/min (ref >60)
Glucose, Bld: 91 mg/dL (ref 70–99)
Potassium: 4.2 mmol/L (ref 3.5–5.1)
Sodium: 140 mmol/L (ref 135–145)

## 2018-10-24 MED ORDER — TRAMADOL HCL 50 MG PO TABS: 50.0000 mg | ORAL_TABLET | Freq: Four times a day (QID) | ORAL | 0 refills | Status: DC | PRN

## 2018-10-24 MED ORDER — DOCUSATE SODIUM 100 MG PO CAPS: 100.0000 mg | ORAL_CAPSULE | Freq: Two times a day (BID) | ORAL | 0 refills | Status: AC

## 2018-10-24 MED ORDER — OXYCODONE HCL 5 MG PO TABS: 5.0000 mg | ORAL_TABLET | ORAL | 0 refills | Status: DC | PRN

## 2018-10-24 NOTE — Plan of Care (Signed)
  Problem: Clinical Measurements: Goal: Will remain free from infection Outcome: Progressing   Problem: Nutrition: Goal: Adequate nutrition will be maintained Outcome: Progressing   Problem: Coping: Goal: Level of anxiety will decrease Outcome: Progressing

## 2018-10-24 NOTE — Social Work (Signed)
Continue to await authorization confirmation. Have contacted facility for updates at 12:03pm, 2:00pm, 3:47pm, 4:27pm. Await confirmation.  Westley Hummer, MSW, [place] Work [phone]

## 2018-10-24 NOTE — Progress Notes (Signed)
Occupational Therapy Treatment Patient Details Name: [name] MRN: [id number] DOB: 1946/02/12 Today's Date: 10/24/2018    History of present illness Patient is a 73 y/o female presenting to the ED on 10/12/18 due to R ankle wound. Past medical history significant of severe MS, HTN, trigeminal neuralgia, gait disturbance, dementia, DVT. S/p R Transtibial amputation and Application of Prevena wound VAC on 10/17/18.    OT comments  Pt seen for progress session. Pt performing bed mobility with MaxA today coming to EOB with poor to fair sitting balance intermittently for 5 mins. Pt fatigues easily and requires cues to sit upright. Pt with extensor patterns in LUE and unable to control gross motor coordination at this time. Pt maxA to totalA for sitting to supine and scooting to HOB. Pt set-upA for grooming and pt following all commands for grooming using LUE mostly. Pt continues to be limited by pain and immobility and would benefit from continued OT skilled services for ADL, mobility and safety in SNF setting.   Follow Up Recommendations  SNF;Supervision/Assistance - 24 hour    Equipment Recommendations  None recommended by OT    Recommendations for Other Services      Precautions / Restrictions Precautions Precautions: Fall Precaution Comments: RLE wound vac Restrictions Weight Bearing Restrictions: Yes RLE Weight Bearing: Non weight bearing       Mobility Bed Mobility Overal bed mobility: Needs Assistance Bed Mobility: Rolling;Sidelying to Sit;Sit to Supine Rolling: Max assist Sidelying to sit: Max assist(only used +1 for sitting EOB; for efficiency, +2) Supine to sit: Max assist;HOB elevated(only used +1 for sitting EOB; for efficiency, +2) Sit to supine: Max assist;HOB elevated(only used +1 for sitting EOB; for efficiency, +2)      Transfers Overall transfer level: Needs assistance Equipment used: 2 person hand held assist             General transfer comment:  Pt unable to scoot to head of bed on EOB; difficulty following commands    Balance     Sitting balance-Leahy Scale: Poor Sitting balance - Comments: fair to poor balance at EOB; pt inconsistently using BUEs for wt bearing to assist with bed mobility. Postural control: Posterior lean                                 ADL either performed or assessed with clinical judgement   ADL Overall ADL's : Needs assistance/impaired Eating/Feeding: Supervision/ safety;Set up;Cueing for safety;Cueing for sequencing;Bed level   Grooming: Set up;Cueing for safety;Cueing for sequencing;Bed level                               Functional mobility during ADLs: Maximal assistance;+2 for physical assistance;+2 for safety/equipment(to sit EOB; lift for transfers) General ADL Comments: Increased alertness today for grooming, set-upA only required     Vision   Vision Assessment?: No apparent visual deficits   Perception     Praxis      Cognition Arousal/Alertness: Awake/alert Behavior During Therapy: Flat affect Overall Cognitive Status: History of cognitive impairments - at baseline                                          Exercises     Shoulder Instructions       General  Comments      Pertinent Vitals/ Pain       Pain Assessment: Faces Faces Pain Scale: Hurts a little bit Pain Location: Noted grimace with movement Pain Descriptors / Indicators: Grimacing Pain Intervention(s): Repositioned  Home Living                                          Prior Functioning/Environment              Frequency  Min 2X/week        Progress Toward Goals  OT Goals(current goals can now be found in the care plan section)  Progress towards OT goals: Progressing toward goals  Acute Rehab OT Goals Patient Stated Goal: Did not sepcifically state, but agreeable to work  OT Goal Formulation: With patient Time For Goal Achievement:  11/05/18 Potential to Achieve Goals: Good ADL Goals Pt Will Perform Grooming: with min guard assist;sitting Pt Will Perform Upper Body Dressing: with min assist Pt Will Perform Lower Body Dressing: with max assist Pt Will Transfer to Toilet: bedside commode;squat pivot transfer;with max assist Additional ADL Goal #1: Pt will performing bed mobility with ModA to sit EOB x5 mins for ADLs.  Plan Discharge plan remains appropriate    Co-evaluation                 AM-PAC OT "6 Clicks" Daily Activity     Outcome Measure   Help from another person eating meals?: A Little Help from another person taking care of personal grooming?: A Little Help from another person toileting, which includes using toliet, bedpan, or urinal?: Total Help from another person bathing (including washing, rinsing, drying)?: A Lot Help from another person to put on and taking off regular upper body clothing?: A Lot Help from another person to put on and taking off regular lower body clothing?: Total 6 Click Score: 12    End of Session    OT Visit Diagnosis: Unsteadiness on feet (R26.81);Muscle weakness (generalized) (M62.81);Other abnormalities of gait and mobility (R26.89)   Activity Tolerance Treatment limited secondary to medical complications (Comment);Patient limited by fatigue   Patient Left in bed;with call bell/phone within reach;with bed alarm set   Nurse Communication Mobility status;Need for lift equipment        Time: 1040-1106 OT Time Calculation (min): 26 min  Charges: OT General Charges $OT Visit: 1 Visit OT Treatments $Self Care/Home Management : 8-22 mins $Neuromuscular Re-education: 8-22 mins  Darryl Nestle) Marsa Aris OTR/L Acute Rehabilitation Services Pager: [phone] Office: [phone]    Fredda Hammed 10/24/2018, 11:48 AM

## 2018-10-25 DIAGNOSIS — G500 Trigeminal neuralgia: Secondary | ICD-10-CM | POA: Diagnosis not present

## 2018-10-25 DIAGNOSIS — Z23 Encounter for immunization: Secondary | ICD-10-CM | POA: Diagnosis not present

## 2018-10-25 DIAGNOSIS — W2209XA Striking against other stationary object, initial encounter: Secondary | ICD-10-CM | POA: Diagnosis not present

## 2018-10-25 DIAGNOSIS — Z7401 Bed confinement status: Secondary | ICD-10-CM | POA: Diagnosis not present

## 2018-10-25 DIAGNOSIS — D649 Anemia, unspecified: Secondary | ICD-10-CM | POA: Diagnosis not present

## 2018-10-25 DIAGNOSIS — S88111D Complete traumatic amputation at level between knee and ankle, right lower leg, subsequent encounter: Secondary | ICD-10-CM | POA: Diagnosis not present

## 2018-10-25 DIAGNOSIS — I82409 Acute embolism and thrombosis of unspecified deep veins of unspecified lower extremity: Secondary | ICD-10-CM | POA: Diagnosis not present

## 2018-10-25 DIAGNOSIS — F0390 Unspecified dementia without behavioral disturbance: Secondary | ICD-10-CM | POA: Diagnosis not present

## 2018-10-25 DIAGNOSIS — G35 Multiple sclerosis: Secondary | ICD-10-CM | POA: Diagnosis not present

## 2018-10-25 DIAGNOSIS — F29 Unspecified psychosis not due to a substance or known physiological condition: Secondary | ICD-10-CM | POA: Diagnosis not present

## 2018-10-25 DIAGNOSIS — I739 Peripheral vascular disease, unspecified: Secondary | ICD-10-CM | POA: Diagnosis not present

## 2018-10-25 DIAGNOSIS — Z89519 Acquired absence of unspecified leg below knee: Secondary | ICD-10-CM | POA: Diagnosis not present

## 2018-10-25 DIAGNOSIS — D509 Iron deficiency anemia, unspecified: Secondary | ICD-10-CM | POA: Diagnosis not present

## 2018-10-25 DIAGNOSIS — Z4789 Encounter for other orthopedic aftercare: Secondary | ICD-10-CM | POA: Diagnosis not present

## 2018-10-25 DIAGNOSIS — F322 Major depressive disorder, single episode, severe without psychotic features: Secondary | ICD-10-CM | POA: Diagnosis not present

## 2018-10-25 DIAGNOSIS — I10 Essential (primary) hypertension: Secondary | ICD-10-CM | POA: Diagnosis not present

## 2018-10-25 DIAGNOSIS — F0280 Dementia in other diseases classified elsewhere without behavioral disturbance: Secondary | ICD-10-CM | POA: Diagnosis not present

## 2018-10-25 DIAGNOSIS — M2550 Pain in unspecified joint: Secondary | ICD-10-CM | POA: Diagnosis not present

## 2018-10-25 DIAGNOSIS — R2689 Other abnormalities of gait and mobility: Secondary | ICD-10-CM | POA: Diagnosis not present

## 2018-10-25 NOTE — Progress Notes (Signed)
Physical Therapy Treatment Patient Details Name: [name] MRN: [id number] DOB: January 14, 1946 Today's Date: 10/25/2018    History of Present Illness Patient is a 73 y/o female presenting to the ED on 10/12/18 due to R ankle wound. Past medical history significant of severe MS, HTN, trigeminal neuralgia, gait disturbance, dementia, DVT. S/p R Transtibial amputation and Application of Prevena wound VAC on 10/17/18.     PT Comments    Patient received in bed, ongoing flat affect but pleasant and willing to participate in PT. Poor initiation noted today and she required totalAx2 to come to sitting at EOB, then Mod-MaxAx1 with intermittent periods of Min guardx1 to maintain sitting at EOB for 8 minutes with strong posterior lean noted today. Fatigued after sitting for this long. Able to perform sit to supine with maxAx2 due to fatigue, also totalAx2 for repositioning in bed. She was left in bed positioned to comfort with bed alarm active and all needs otherwise met this morning. Continue to recommend SNF and 24/7 S.     Follow Up Recommendations  SNF;Supervision/Assistance - 24 hour     Equipment Recommendations  Other (comment)(defer to next venue )    Recommendations for Other Services       Precautions / Restrictions Precautions Precautions: Fall Precaution Comments: RLE wound vac Restrictions Weight Bearing Restrictions: Yes RLE Weight Bearing: Non weight bearing    Mobility  Bed Mobility Overal bed mobility: Needs Assistance Bed Mobility: Supine to Sit;Sit to Supine     Supine to sit: Total assist;+2 for physical assistance Sit to supine: Max assist;+2 for physical assistance   General bed mobility comments: patient with strong posterior lean and poor initiation today requiring totalAx2 for supine to sit; once up, able to maintain sitting balance with mod-MaxA and intermittent/inconsistent periods of Min guard. Continues to have posterior lean/trunk extension. Able to  maintain sitting at EOB for 8 minutes today. MaxAx2 for return to supine  Transfers                 General transfer comment: deferred  Ambulation/Gait             General Gait Details: unable    Stairs             Wheelchair Mobility    Modified Rankin (Stroke Patients Only)       Balance Overall balance assessment: Needs assistance Sitting-balance support: Bilateral upper extremity supported;Feet supported Sitting balance-Leahy Scale: Poor Sitting balance - Comments: strong posterior lean  Postural control: Posterior lean                                  Cognition Arousal/Alertness: Awake/alert Behavior During Therapy: Flat affect Overall Cognitive Status: History of cognitive impairments - at baseline                                 General Comments: son stating history of cognitive deficits; likely at baseline      Exercises      General Comments        Pertinent Vitals/Pain Pain Assessment: Faces Faces Pain Scale: Hurts a little bit Pain Location: Noted grimace with movement Pain Descriptors / Indicators: Grimacing Pain Intervention(s): Repositioned;Limited activity within patient's tolerance;Monitored during session    Home Living  Prior Function            PT Goals (current goals can now be found in the care plan section) Acute Rehab PT Goals Patient Stated Goal: Did not sepcifically state, but agreeable to work  PT Goal Formulation: With patient Time For Goal Achievement: 11/01/18 Potential to Achieve Goals: Fair Progress towards PT goals: Progressing toward goals    Frequency    Min 2X/week      PT Plan Current plan remains appropriate    Co-evaluation              AM-PAC PT "6 Clicks" Mobility   Outcome Measure  Help needed turning from your back to your side while in a flat bed without using bedrails?: A Lot Help needed moving from lying on your  back to sitting on the side of a flat bed without using bedrails?: Total Help needed moving to and from a bed to a chair (including a wheelchair)?: Total Help needed standing up from a chair using your arms (e.g., wheelchair or bedside chair)?: Total Help needed to walk in hospital room?: Total Help needed climbing 3-5 steps with a railing? : Total 6 Click Score: 7    End of Session   Activity Tolerance: Patient tolerated treatment well Patient left: in bed;with call bell/phone within reach;with bed alarm set   PT Visit Diagnosis: Unsteadiness on feet (R26.81);Other abnormalities of gait and mobility (R26.89);Muscle weakness (generalized) (M62.81)     Time: 6553-7482 PT Time Calculation (min) (ACUTE ONLY): 16 min  Charges:  $Therapeutic Activity: 8-22 mins                     Deniece Ree PT, DPT, CBIS  Supplemental Physical Therapist [place]    Pager [phone] Acute Rehab Office [phone]

## 2018-10-25 NOTE — Plan of Care (Signed)
  Problem: Clinical Measurements: Goal: Ability to maintain clinical measurements within normal limits will improve Outcome: Progressing Goal: Will remain free from infection Outcome: Progressing   Problem: Nutrition: Goal: Adequate nutrition will be maintained Outcome: Progressing   Problem: Coping: Goal: Level of anxiety will decrease Outcome: Progressing   Problem: Pain Managment: Goal: General experience of comfort will improve Outcome: Progressing   

## 2018-10-25 NOTE — Social Work (Addendum)
10:08- f/u with [facility], authorization continues to be under review  8:50am- CSW spoke with pt son, we discussed that he had received feedback from family about facilities and had "gotten them mixed up which facility my cousin was in." Pt son states he felt rushed to make decision. CSW reviewed that pt son had gotten offers on Friday 1/10 and had 48 hours to review facilities and tour. Pt son was requested on Monday to make an offer due to pt medical stability. Pt son at that time chose [facility] and they initiated insurance approval. Given that pt is medically stable and ready for discharge and that insurance authorization has been started for the facility that pt son chose we are unable to change facility. We discussed that son can pursue change of facility if he still was unhappy with facility when pt left. However CSW encouraged pt son to speak with facility liaisons before making any moves regarding any concerns.  Continue to await insurance approval.  Westley Hummer, MSW, [place] Work [phone]

## 2018-10-25 NOTE — Progress Notes (Signed)
Patient seen and examined this morning. Complaint of some dental pain on the left upper teeth yesterday, otherwise no nausea, vomiting, chest pain, fever or chills. I reviewed her charts, labs, and personally examined the patient. Lungs are clear bilaterally, s1/s2, right BKA with wound VAC, left lower extremity warm with palpable pulses Briefly this is a 73 year old female with severe MS, hypertension, trigeminal neuralgia, gait disturbance, dementia, DVT history who was brought with nonhealing right ankle wound, status post right BKA, wound is healing with wound VAC in place. Continue pain control with oral opiates, Remains afebrile T-max 99, routine labs with last hemoglobin 9.4 g 1/13, creatinine 0.7, 1/15. Patient medically stable for discharge pending SNF bed availability Discussed with the nursing staff regarding the plan. Per RN Son wanting to talk with social worker as he is not happy with current choice of skilled nursing facility. Please refer to previous discharge summary/progress note for more detail.

## 2018-10-25 NOTE — Progress Notes (Signed)
Pt. son called and requested to speak to CSW in the morning with regards to SNF placement for the pt. Pt. son states that he does not want to send the pt. to the SNF that was picked out.

## 2018-10-25 NOTE — Consult Note (Signed)
[facility] CM Primary Care Navigator  10/25/2018  [name] 11-14-1945 [id number]    Met with patientat the bedside and spoke with her son [name]) over the phone to identify possible discharge needs. Patient is alert and oriented to self only (dementia) per RN report.  Son reportsthatpatient had a "cut to right ankle that got infected bad" whichresultedto thisadmission/ surgery. (Right ankle wound/ osteomyelitis, status post right below the knee amputation)  Patient's son endorsesDr. Butch Penny Gates(retired) or whoever took her place as patient's primary careprovider.   [facility] [address] [facility].  Patient's son has been managing medications for patient at home with use of "pill box" system filled weekly.  Patient's son hasbeen providing transportationfor her to doctors' appointments.  Patientlives with son who is her primary caregiver (full-time) at home. He mentioned that patient also has health aide from [place] who comes to assist her M-F, 2 hours/ day.   Anticipated discharge planisSNF- skilled nursing facility per therapy recommendation,prior to returning back home.Son mentioned that he had made the Inpatient social worker aware of the skilled nursing facility preference he wants her mother to go.  Patient's son expressedunderstanding to call primary care provider's office once patient returns back home for a post discharge follow-upwithin1- 2 weeksor sooner if needs arise.Patient letter (with PCP's contact number) was provided asareminder at the bedside and son was made aware of it.   Explained to patient's son about [facility] CM services available for health managementand resources,buthe states having no current needs or concernsat this time. Son mentioned that he is with patient 24/7 and has been helping manage patient's health needs at home.  Patient's son  expressed understanding to seekreferral to [facility] care management from primary care provider if deemed necessary and appropriateforanyservicesinthenear future.   [facility] care management information provided for future needs that patient may have.  Primary care provider's office is listed as providing transition of care (TOC).   For additional questions please contact:  Edwena Felty A. Kaytee Taliercio, BSN, RN-BC [facility] PRIMARY CARE Navigator Cell: [phone]

## 2018-10-25 NOTE — Plan of Care (Signed)
  Problem: Clinical Measurements: Goal: Will remain free from infection Outcome: Progressing   Problem: Nutrition: Goal: Adequate nutrition will be maintained Outcome: Progressing   Problem: Coping: Goal: Level of anxiety will decrease Outcome: Progressing   Problem: Pain Managment: Goal: General experience of comfort will improve Outcome: Progressing   Problem: Safety: Goal: Ability to remain free from injury will improve Outcome: Progressing   Problem: Skin Integrity: Goal: Risk for impaired skin integrity will decrease Outcome: Progressing   

## 2018-10-25 NOTE — Social Work (Addendum)
3:08pm- CSW spoke with Freda Munro at [place] received through [place]. Freda Munro will call son to complete paperwork.   Clinical Social Worker facilitated patient discharge including contacting patient family and facility to confirm patient discharge plans.  Clinical information faxed to facility and family agreeable with plan.  CSW arranged ambulance transport via [facility] to [facility] at 5:30 pm.  RN to call [phone]  with report prior to discharge.  CSW spoke with pt son and he continues to state "I haven't been to the facility yet so I told them I'd come check it out." Again reinforced that the pt has been discharged and is medically ready for discharge.   If pt gets to facility and pt son feels that he would like pt transitioned to another SNF that he could coordinate with facility. Again spoke with son regarding that he had not mentioned a change of facility until this morning and that authorization that been initiated for pt on Monday when we discussed medical readiness.   Pt son states he will go by facility now, made pt son aware that I had scheduled [facility] for 5:30pm and that RN Mendel Ryder would assist with discharge of pt.     12:47pm- Additional therapy note sent to [facility] for [employer].  Westley Hummer, MSW, [place] Work [phone]

## 2018-10-30 ENCOUNTER — Encounter (INDEPENDENT_AMBULATORY_CARE_PROVIDER_SITE_OTHER): Payer: Self-pay | Admitting: Physician Assistant

## 2018-10-30 ENCOUNTER — Ambulatory Visit (INDEPENDENT_AMBULATORY_CARE_PROVIDER_SITE_OTHER): Payer: Medicare HMO | Admitting: Physician Assistant

## 2018-10-30 VITALS — Ht 63.0 in | Wt 190.0 lb

## 2018-10-30 DIAGNOSIS — G35 Multiple sclerosis: Secondary | ICD-10-CM

## 2018-10-30 DIAGNOSIS — E440 Moderate protein-calorie malnutrition: Secondary | ICD-10-CM

## 2018-10-30 DIAGNOSIS — Z89511 Acquired absence of right leg below knee: Secondary | ICD-10-CM

## 2018-10-30 NOTE — Progress Notes (Signed)
Office Visit Note   Patient: [name]           Date of Birth: 06-24-1946           MRN: [id number] Visit Date: 10/30/2018              Requested by: No referring provider defined for this encounter. PCP: [name], MD (Inactive)  Chief Complaint  Patient presents with  . Right Leg - Routine Post Op    Right BKA      HPI: The patient is a 73 yo woman who is seen for post operative follow up following a right below the knee amputation for osteomyelitis of the right foot and ankle 10/17/2018.  She was discharged to [facility] for rehab.  She is here with her son who helps with her caregiving when she is at home.  The Praveena VAC was on postoperatively and was removed today as it may have not been working for the past 24 hours.  The son reports increased spasticity over the patient's left lower leg and he is going to contact the patient's neurologist concerning some increased spasticity with her history of MS.  The patient did not walk much but was able to weight-bear for transfers prior to her hospitalization.  Assessment & Plan: Visit Diagnoses:  1. Acquired absence of right lower extremity below knee ([place])   2. Moderate protein malnutrition ([place])   3. Multiple sclerosis ([place])     Plan: The Praveena VAC was removed this visit.  We will leave the staples in place for at least another week perhaps more.  The incisional area was dressed with gauze and Ace wrapping in figure-of-eight fashion.  They were given orders for [facility] to see the patient for stump shrinker stockings.  We will hold off on prosthetic ordering for now.  The patient will follow-up in 1 week.  Follow-Up Instructions: Return in about 1 week (around 11/06/2018).   Ortho Exam  Patient is alert, oriented, no adenopathy, well-dressed, normal affect, normal respiratory effort. The Praveena VAC was removed.  There is minimal bloody drainage from the central incisional area but staples are intact.  She  has mild discomfort to palpation over the distal stump.  She does have full knee extension and good flexion.  She has increased severe spasticity to the left lower extremity with the knee and ankle both in extension and difficulty ranging either of these and the son is going to contact her neurologist concerning this.  Imaging: No results found. No images are attached to the encounter.  Labs: Lab Results  Component Value Date   ESRSEDRATE 93 (H) 10/11/2018   CRP 9.8 (H) 10/11/2018   REPTSTATUS 10/16/2018 FINAL 10/11/2018   CULT  10/11/2018    NO GROWTH 5 DAYS Performed at [facility], [place] [address] [place], [place] [address]    LABORGA ESCHERICHIA COLI 08/02/2009     Lab Results  Component Value Date   ALBUMIN 3.1 (L) 10/11/2018   ALBUMIN 4.2 07/09/2018   ALBUMIN 4.2 12/26/2017    Body mass index is 33.66 kg/m.  Orders:  No orders of the defined types were placed in this encounter.  No orders of the defined types were placed in this encounter.    Procedures: No procedures performed  Clinical Data: No additional findings.  ROS:  All other systems negative, except as noted in the HPI. Review of Systems  Objective: Vital Signs: Ht 5\' 3"  (1.6 m)  Wt 190 lb 0.6 oz (86.2 kg)   BMI 33.66 kg/m   Specialty Comments:  No specialty comments available.  PMFS History: Patient Active Problem List   Diagnosis Date Noted  . Bedbound 10/13/2018  . Cellulitis of right lower extremity   . Wound infection 10/12/2018  . Subacute osteomyelitis, right ankle and foot ([place])   . Neurogenic bladder 06/16/2016  . Memory difficulties 12/10/2014  . Gait disorder 07/01/2013  . Tooth pain 11/02/2011  . Protein calorie malnutrition ([place]) 10/07/2011  . Constipation 09/23/2011  . Failure to thrive in childhood 09/22/2011  . Decubitus ulcer, buttock 09/22/2011  . Fibroid uterus 09/22/2011  . Abdominal pain, lower 09/22/2011  . Multiple sclerosis ([place])   . Hypertension    . Depression   . Organic brain syndrome   . Trigeminal neuralgia    Past Medical History:  Diagnosis Date  . Abnormality of gait   . Depression   . DVT (deep venous thrombosis) (HCC)    Left leg  . Dyslipidemia   . Fibroids   . Hypertension   . Memory difficulties 12/10/2014  . Multiple sclerosis (HCC)    Right sided weakness and gait disorder, bladder and bowel problems   . Neurogenic bladder   . 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 Overdose of muscle relaxant    Unintentional baclofen overdose   . Trigeminal neuralgia   . Trigeminal neuralgia    Right  . Wound infection 10/12/2018    Family History  Problem Relation Age of Onset  . Multiple sclerosis Sister   . Cervical cancer Mother   . Liver disease Father   . Hyperlipidemia Brother   . Pancreatitis Brother   . Hypertension Other   . Heart disease Other   . Uterine cancer Other     Past Surgical History:  Procedure Laterality Date  . AMPUTATION Right 10/17/2018   Procedure: RIGHT BELOW KNEE AMPUTATION;  Surgeon: [name], MD;  Location: [place];  Service: Orthopedics;  Laterality: Right;  . gamma knife procedure for rt. trigeminal neuralgia Right   . left leg DVT Left   . STRABISMUS SURGERY     Right eye    Social History   Occupational History  . Occupation: disabled Education officer, museum  Tobacco Use  . Smoking status: Never Smoker  . Smokeless tobacco: Never Used  Substance and Sexual Activity  . Alcohol use: No  . Drug use: No  . Sexual activity: Not Currently    Birth control/protection: Post-menopausal

## 2018-11-01 DIAGNOSIS — G500 Trigeminal neuralgia: Secondary | ICD-10-CM | POA: Diagnosis not present

## 2018-11-01 DIAGNOSIS — I739 Peripheral vascular disease, unspecified: Secondary | ICD-10-CM | POA: Diagnosis not present

## 2018-11-01 DIAGNOSIS — Z89519 Acquired absence of unspecified leg below knee: Secondary | ICD-10-CM | POA: Diagnosis not present

## 2018-11-01 DIAGNOSIS — F322 Major depressive disorder, single episode, severe without psychotic features: Secondary | ICD-10-CM | POA: Diagnosis not present

## 2018-11-01 DIAGNOSIS — I82409 Acute embolism and thrombosis of unspecified deep veins of unspecified lower extremity: Secondary | ICD-10-CM | POA: Diagnosis not present

## 2018-11-01 DIAGNOSIS — F0390 Unspecified dementia without behavioral disturbance: Secondary | ICD-10-CM | POA: Diagnosis not present

## 2018-11-01 DIAGNOSIS — D649 Anemia, unspecified: Secondary | ICD-10-CM | POA: Diagnosis not present

## 2018-11-01 DIAGNOSIS — I10 Essential (primary) hypertension: Secondary | ICD-10-CM | POA: Diagnosis not present

## 2018-11-01 DIAGNOSIS — G35 Multiple sclerosis: Secondary | ICD-10-CM | POA: Diagnosis not present

## 2018-11-06 ENCOUNTER — Ambulatory Visit (INDEPENDENT_AMBULATORY_CARE_PROVIDER_SITE_OTHER): Payer: Medicare HMO | Admitting: Physician Assistant

## 2018-11-08 ENCOUNTER — Ambulatory Visit (INDEPENDENT_AMBULATORY_CARE_PROVIDER_SITE_OTHER): Payer: Medicare HMO | Admitting: Physician Assistant

## 2018-11-08 ENCOUNTER — Telehealth: Payer: Self-pay | Admitting: Adult Health

## 2018-11-08 ENCOUNTER — Encounter (INDEPENDENT_AMBULATORY_CARE_PROVIDER_SITE_OTHER): Payer: Self-pay | Admitting: Physician Assistant

## 2018-11-08 VITALS — Ht 63.0 in | Wt 190.0 lb

## 2018-11-08 DIAGNOSIS — E440 Moderate protein-calorie malnutrition: Secondary | ICD-10-CM

## 2018-11-08 DIAGNOSIS — Z89511 Acquired absence of right leg below knee: Secondary | ICD-10-CM

## 2018-11-08 DIAGNOSIS — G35 Multiple sclerosis: Secondary | ICD-10-CM

## 2018-11-08 NOTE — Progress Notes (Signed)
Office Visit Note   Patient: [name]           Date of Birth: 1946/05/06           MRN: [id number] Visit Date: 11/08/2018              Requested by: No referring provider defined for this encounter. PCP: [name], MD (Inactive)  Chief Complaint  Patient presents with  . Right Leg - Routine Post Op    BKA right leg 10/17/2018      HPI: The patient is a 73 year old woman who is seen for postoperative follow-up following a right below the knee amputation for osteomyelitis of the right foot and ankle on 10/17/2018.  She is currently at [place] [facility] for rehabilitation.  She is here with her son who helps care for her in the home setting due to a history of MS. She comes in today with her stump shrinker stocking on.  She reports minimal pain over the area.  Assessment & Plan: Visit Diagnoses:  1. Acquired absence of right lower extremity below knee ([place])   2. Moderate protein malnutrition ([place])   3. Multiple sclerosis ([place])     Plan: Staples were harvested today.  Orders were sent for the nursing facility to wash the residual limb daily and apply her stump shrinker stocking.  She should continue to work on full knee extension and flexion.  She currently has a lot of spasticity in her left lower extremity and we have asked that she be evaluated by her neurologist for this.  She will follow-up here in 4 weeks.  Follow-Up Instructions: Return in about 4 weeks (around 12/06/2018).   Ortho Exam  Patient is alert, oriented, no adenopathy, well-dressed, normal affect, normal respiratory effort. The right transtibial amputation site is healing well and staples were removed today.  There is no sign of infection or cellulitis.  She has full knee extension and good flexion but some increase in spasticity noted bilaterally.  She was not a functional ambulator prior to her hospitalization but did weight-bear on her extremities for transfers.    Imaging: No results  found. No images are attached to the encounter.  Labs: Lab Results  Component Value Date   ESRSEDRATE 93 (H) 10/11/2018   CRP 9.8 (H) 10/11/2018   REPTSTATUS 10/16/2018 FINAL 10/11/2018   CULT  10/11/2018    NO GROWTH 5 DAYS Performed at [facility], [place] [address], [place], [place] [address]    LABORGA ESCHERICHIA COLI 08/02/2009     Lab Results  Component Value Date   ALBUMIN 3.1 (L) 10/11/2018   ALBUMIN 4.2 07/09/2018   ALBUMIN 4.2 12/26/2017    Body mass index is 33.66 kg/m.  Orders:  No orders of the defined types were placed in this encounter.  No orders of the defined types were placed in this encounter.    Procedures: No procedures performed  Clinical Data: No additional findings.  ROS:  All other systems negative, except as noted in the HPI. Review of Systems  Objective: Vital Signs: Ht 5\' 3"  (1.6 m)   Wt 190 lb 0.6 oz (86.2 kg)   BMI 33.66 kg/m   Specialty Comments:  No specialty comments available.  PMFS History: Patient Active Problem List   Diagnosis Date Noted  . Bedbound 10/13/2018  . Cellulitis of right lower extremity   . Wound infection 10/12/2018  . Subacute osteomyelitis, right ankle and foot ([place])   . Neurogenic  bladder 06/16/2016  . Memory difficulties 12/10/2014  . Gait disorder 07/01/2013  . Tooth pain 11/02/2011  . Protein calorie malnutrition ([place]) 10/07/2011  . Constipation 09/23/2011  . Failure to thrive in childhood 09/22/2011  . Decubitus ulcer, buttock 09/22/2011  . Fibroid uterus 09/22/2011  . Abdominal pain, lower 09/22/2011  . Multiple sclerosis ([place])   . Hypertension   . Depression   . Organic brain syndrome   . Trigeminal neuralgia    Past Medical History:  Diagnosis Date  . Abnormality of gait   . Depression   . DVT (deep venous thrombosis) (HCC)    Left leg  . Dyslipidemia   . Fibroids   . Hypertension   . Memory difficulties 12/10/2014  . Multiple sclerosis (HCC)    Right sided weakness  and gait disorder, bladder and bowel problems   . Neurogenic bladder   . 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 Overdose of muscle relaxant    Unintentional baclofen overdose   . Trigeminal neuralgia   . Trigeminal neuralgia    Right  . Wound infection 10/12/2018    Family History  Problem Relation Age of Onset  . Multiple sclerosis Sister   . Cervical cancer Mother   . Liver disease Father   . Hyperlipidemia Brother   . Pancreatitis Brother   . Hypertension Other   . Heart disease Other   . Uterine cancer Other     Past Surgical History:  Procedure Laterality Date  . AMPUTATION Right 10/17/2018   Procedure: RIGHT BELOW KNEE AMPUTATION;  Surgeon: [name], MD;  Location: [facility];  Service: Orthopedics;  Laterality: Right;  . gamma knife procedure for rt. trigeminal neuralgia Right   . left leg DVT Left   . STRABISMUS SURGERY     Right eye    Social History   Occupational History  . Occupation: disabled Education officer, museum  Tobacco Use  . Smoking status: Never Smoker  . Smokeless tobacco: Never Used  Substance and Sexual Activity  . Alcohol use: No  . Drug use: No  . Sexual activity: Not Currently    Birth control/protection: Post-menopausal

## 2018-11-08 NOTE — Telephone Encounter (Signed)
Can you get more info- when is spasticity worse. Currently its prescribed 1 tab in the morning and 2 at night is this how she is taking it.

## 2018-11-08 NOTE — Telephone Encounter (Signed)
Patients son [name] calling to let us know that mother recently had herankle amputated by [facility]. Dr [name] has noticed more spasticity & is asking if Baclofen dose we prescribe can be increased. Please call son to advise. Patient currently in rehab center at [facility] on [address] [facility] in [place]

## 2018-11-08 NOTE — Telephone Encounter (Addendum)
Called [name] who stated she's taking baclofen 10 mg, one tab three x a day. He's unsure when the spasticity occurs, stated she saw Dr [name] a week ago for post op and dr mentioned increasing Baclofen then. She saw Dr [name] again today, and he mentioned the Baclofen again. That is why [name] called. She is currently at [facility] [phone]. I advised will let NP know and only call him back if she has further questions.  He verbalized understanding, appreciation.

## 2018-11-09 ENCOUNTER — Encounter (INDEPENDENT_AMBULATORY_CARE_PROVIDER_SITE_OTHER): Payer: Self-pay | Admitting: Physician Assistant

## 2018-11-09 ENCOUNTER — Telehealth (INDEPENDENT_AMBULATORY_CARE_PROVIDER_SITE_OTHER): Payer: Self-pay | Admitting: Orthopedic Surgery

## 2018-11-09 NOTE — Telephone Encounter (Signed)
[name]  [name]  [phone] Closes at noon today   Patient son called stated Dr.Duda wanted the baclofen medicine increased. The nurse practitioner at [facility] hasn't treated patient in 6 mnths, would like Dr.Duda to change medicine to whatever he feels most beneficial for the patient. [name] is currently taking 10 mg of baclofen 1 tablet 3 times a day.

## 2018-11-09 NOTE — Telephone Encounter (Signed)
I called and sw pt's son to advise of below. To call with any other questions.

## 2018-11-09 NOTE — Telephone Encounter (Signed)
Patient needs to follow up with neurology as this is not a drug we prescribe routinely for spasticity. Would recommend they follow up with a visit to neurology.

## 2018-11-09 NOTE — Telephone Encounter (Signed)
Since Dr. [name] has seen her recently. I am ok if they adjust the dose to what they feel would be beneficial. Maybe call Dr. [name] and let them know.

## 2018-11-09 NOTE — Telephone Encounter (Addendum)
Called Dr Jess Barters office, spoke with [facility] and advised her that I needed to get message to Dr [name] or his RN. Chenise stated she'd take information and route to RN. I advised her of son, [name]'s request and that the NP would agree with Dr [name] increasing Baclofen dose as he feels appropriate. The NP last saw patient Sept 2019. I gave Chenise her current dose and schedule of baclofen, my call back # and that we close at 12 today. Chenise verbalized understanding.

## 2018-11-09 NOTE — Telephone Encounter (Signed)
Will you please review below and advise?

## 2018-11-14 ENCOUNTER — Telehealth: Payer: Self-pay | Admitting: *Deleted

## 2018-11-14 NOTE — Telephone Encounter (Signed)
CMM PA for Avonex initiated 11-14-2018.  KEY [id number].  Determination pending.

## 2018-11-14 NOTE — Telephone Encounter (Addendum)
I faxed to [facility] [phone] approval for avonex from [place] [id number].  Good until 10/10/2019.  [phone].

## 2018-11-14 NOTE — Telephone Encounter (Signed)
Approval from [place] [id number] for [place] until 10/10/2019.  Fax confirmation received to [facility] [phone].

## 2018-11-14 NOTE — Telephone Encounter (Signed)
Elberta Fortis @ [facility] has called asking if the PA for the Interferon Beta-1a (AVONEX PEN) 30 MCG/0.5ML AJKT has been received.  Please call [phone]

## 2018-11-14 NOTE — Telephone Encounter (Signed)
See other phone note today. Avonex approved.

## 2018-11-22 DIAGNOSIS — D649 Anemia, unspecified: Secondary | ICD-10-CM | POA: Diagnosis not present

## 2018-11-22 DIAGNOSIS — R2689 Other abnormalities of gait and mobility: Secondary | ICD-10-CM | POA: Diagnosis not present

## 2018-11-22 DIAGNOSIS — G35 Multiple sclerosis: Secondary | ICD-10-CM | POA: Diagnosis not present

## 2018-11-22 DIAGNOSIS — I10 Essential (primary) hypertension: Secondary | ICD-10-CM | POA: Diagnosis not present

## 2018-11-22 DIAGNOSIS — F322 Major depressive disorder, single episode, severe without psychotic features: Secondary | ICD-10-CM | POA: Diagnosis not present

## 2018-12-06 ENCOUNTER — Ambulatory Visit (INDEPENDENT_AMBULATORY_CARE_PROVIDER_SITE_OTHER): Payer: Medicare HMO | Admitting: Physician Assistant

## 2018-12-10 ENCOUNTER — Ambulatory Visit (INDEPENDENT_AMBULATORY_CARE_PROVIDER_SITE_OTHER): Payer: Medicare HMO | Admitting: Physician Assistant

## 2018-12-13 ENCOUNTER — Other Ambulatory Visit: Payer: Self-pay | Admitting: *Deleted

## 2018-12-13 NOTE — Patient Outreach (Signed)
[facility]) Care Management  12/13/2018  [name] 06-Oct-1946 [id number]  Transition of care post d/c SNF 3/1  RN spoke with pt who provided permission to speak with son [name]). RN introduced the [facility] services and purpose for today's call. Caregiver provided details on pt's ongoing management of care indicating pt has a CNA ([facility]) for 10 hrs weekly which is offered in a program through the county and may obtain services with a previous [facility] for ongoing PT. Caregiver will request this information upon pt's follow up appointment. Caregiver said due to pt's orientation status (drowiness) upon arriving home she was not able to make the appointments already scheduled with her provider so he cancelled but states he will call today and re scheduled with the orthopedic and primary providers.  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 RN encouraged caregiver to present his request on his follow up appointments for the provider to write prescriptions if appropriate for the pt at this time.  RN inquired on any other needs related to preventive care as caregiver indicates pt's HTN is controlled with no needs. None presented at this time. RN offered to follow up with ongoing transition of care call over the next few weeks as pt recovers from her recent discharged from the SNF however this offer was declined. Caregiver feels with all that's going on with the pt and the support of the in home aide services pt has what she needs at this time. RN has informed caregiver that the pt's provider will be notified of pt's disposition with [facility] services at this time and provided a contact name and number if assistance is needed in the future. Will also sent [facility] packet concerning the call placed today. No additional needs as this case will be inactive.   Patient was recently discharged from hospital and all medications have  been reviewed.  Raina Mina, RN Care Management Coordinator [place] Office [phone]

## 2018-12-14 ENCOUNTER — Ambulatory Visit (INDEPENDENT_AMBULATORY_CARE_PROVIDER_SITE_OTHER): Payer: Medicare HMO | Admitting: Physician Assistant

## 2018-12-14 ENCOUNTER — Encounter (INDEPENDENT_AMBULATORY_CARE_PROVIDER_SITE_OTHER): Payer: Self-pay | Admitting: Physician Assistant

## 2018-12-14 VITALS — Ht 63.0 in | Wt 190.0 lb

## 2018-12-14 DIAGNOSIS — Z89511 Acquired absence of right leg below knee: Secondary | ICD-10-CM

## 2018-12-14 DIAGNOSIS — G35 Multiple sclerosis: Secondary | ICD-10-CM

## 2018-12-14 NOTE — Progress Notes (Signed)
Office Visit Note   Patient: [name]           Date of Birth: 1945-11-06           MRN: [id number] Visit Date: 12/14/2018              Requested by: No referring provider defined for this encounter. PCP: [name], MD (Inactive)  Chief Complaint  Patient presents with  . Right Leg - Routine Post Op    10/17/2018 right BKA      HPI: The patient is a 73 year old woman who is seen for postoperative follow-up following a right transtibial amputation on 10/17/2018.  She has been discharged from skilled nursing and is back at home.  Her son reports that they did increase her baclofen for her spasticity and her range of motion in the right knee is much improved.  She is working with [employer] clinic and is wearing her stump shrinker.  They would like to try to get a prosthesis for her to stand in order to transfer more easily.  They are pleased with her progress otherwise.  Assessment & Plan: Visit Diagnoses:  1. Acquired absence of right lower extremity below knee ([place])   2. Multiple sclerosis ([place])     Plan: They were given a prescription for [facility] to fabricate a K1 prosthetic.  The patient will be a limited household ambulator and utilize her prosthesis for transfers with assistance.  She will follow-up in here in several months following when they obtain the prosthesis.  Follow-Up Instructions: Return in about 3 months (around 03/16/2019).   Ortho Exam  Patient is alert, oriented, no adenopathy, well-dressed, normal affect, normal respiratory effort. The patient presents in the wheelchair.  Her spasticity in both lower extremities is much improved.  She has full right knee extension and good flexion.  There is no signs of skin breakdown or ulceration.  She is wearing her stump shrinker and has good consolidation of the residual limb.    Imaging: No results found. No images are attached to the encounter.  Labs: Lab Results  Component Value Date   ESRSEDRATE 93  (H) 10/11/2018   CRP 9.8 (H) 10/11/2018   REPTSTATUS 10/16/2018 FINAL 10/11/2018   CULT  10/11/2018    NO GROWTH 5 DAYS Performed at [facility], [place] [address], [place], [place] [address]    LABORGA ESCHERICHIA COLI 08/02/2009     Lab Results  Component Value Date   ALBUMIN 3.1 (L) 10/11/2018   ALBUMIN 4.2 07/09/2018   ALBUMIN 4.2 12/26/2017    Body mass index is 33.66 kg/m.  Orders:  No orders of the defined types were placed in this encounter.  No orders of the defined types were placed in this encounter.    Procedures: No procedures performed  Clinical Data: No additional findings.  ROS:  All other systems negative, except as noted in the HPI. Review of Systems  Objective: Vital Signs: Ht 5\' 3"  (1.6 m)   Wt 190 lb 0.6 oz (86.2 kg)   BMI 33.66 kg/m   Specialty Comments:  No specialty comments available.  PMFS History: Patient Active Problem List   Diagnosis Date Noted  . Bedbound 10/13/2018  . Cellulitis of right lower extremity   . Wound infection 10/12/2018  . Subacute osteomyelitis, right ankle and foot ([place])   . Neurogenic bladder 06/16/2016  . Memory difficulties 12/10/2014  . Gait disorder 07/01/2013  . Tooth pain 11/02/2011  . Protein calorie  malnutrition ([place]) 10/07/2011  . Constipation 09/23/2011  . Failure to thrive in childhood 09/22/2011  . Decubitus ulcer, buttock 09/22/2011  . Fibroid uterus 09/22/2011  . Abdominal pain, lower 09/22/2011  . Multiple sclerosis ([place])   . Hypertension   . Depression   . Organic brain syndrome   . Trigeminal neuralgia    Past Medical History:  Diagnosis Date  . Abnormality of gait   . Depression   . DVT (deep venous thrombosis) (HCC)    Left leg  . Dyslipidemia   . Fibroids   . Hypertension   . Memory difficulties 12/10/2014  . Multiple sclerosis (HCC)    Right sided weakness and gait disorder, bladder and bowel problems   . Neurogenic bladder   . 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 Overdose of muscle relaxant    Unintentional baclofen overdose   . Trigeminal neuralgia   . Trigeminal neuralgia    Right  . Wound infection 10/12/2018    Family History  Problem Relation Age of Onset  . Multiple sclerosis Sister   . Cervical cancer Mother   . Liver disease Father   . Hyperlipidemia Brother   . Pancreatitis Brother   . Hypertension Other   . Heart disease Other   . Uterine cancer Other     Past Surgical History:  Procedure Laterality Date  . AMPUTATION Right 10/17/2018   Procedure: RIGHT BELOW KNEE AMPUTATION;  Surgeon: [name], MD;  Location: [facility];  Service: Orthopedics;  Laterality: Right;  . gamma knife procedure for rt. trigeminal neuralgia Right   . left leg DVT Left   . STRABISMUS SURGERY     Right eye    Social History   Occupational History  . Occupation: disabled Education officer, [employer]  Tobacco Use  . Smoking status: Never Smoker  . Smokeless tobacco: Never Used  Substance and Sexual Activity  . Alcohol use: No  . Drug use: No  . Sexual activity: Not Currently    Birth control/protection: Post-menopausal

## 2018-12-21 DIAGNOSIS — L89152 Pressure ulcer of sacral region, stage 2: Secondary | ICD-10-CM | POA: Diagnosis not present

## 2018-12-21 DIAGNOSIS — G35 Multiple sclerosis: Secondary | ICD-10-CM | POA: Diagnosis not present

## 2018-12-21 DIAGNOSIS — F0391 Unspecified dementia with behavioral disturbance: Secondary | ICD-10-CM | POA: Diagnosis not present

## 2018-12-21 DIAGNOSIS — Z89511 Acquired absence of right leg below knee: Secondary | ICD-10-CM | POA: Diagnosis not present

## 2018-12-21 DIAGNOSIS — Z09 Encounter for follow-up examination after completed treatment for conditions other than malignant neoplasm: Secondary | ICD-10-CM | POA: Diagnosis not present

## 2018-12-25 ENCOUNTER — Telehealth: Payer: Self-pay | Admitting: Adult Health Nurse Practitioner

## 2018-12-25 NOTE — Telephone Encounter (Signed)
Spoke with son, [name], to set up initial visit on 12/27/2018 at 1pm [name] K. Olena Heckle NP

## 2018-12-27 ENCOUNTER — Other Ambulatory Visit: Payer: Medicare HMO | Admitting: Adult Health Nurse Practitioner

## 2018-12-27 ENCOUNTER — Other Ambulatory Visit: Payer: Self-pay

## 2018-12-27 ENCOUNTER — Telehealth: Payer: Self-pay | Admitting: Adult Health Nurse Practitioner

## 2018-12-27 DIAGNOSIS — F0280 Dementia in other diseases classified elsewhere without behavioral disturbance: Secondary | ICD-10-CM | POA: Diagnosis not present

## 2018-12-27 DIAGNOSIS — Z515 Encounter for palliative care: Secondary | ICD-10-CM | POA: Diagnosis not present

## 2018-12-27 DIAGNOSIS — G500 Trigeminal neuralgia: Secondary | ICD-10-CM | POA: Diagnosis not present

## 2018-12-27 DIAGNOSIS — I10 Essential (primary) hypertension: Secondary | ICD-10-CM | POA: Diagnosis not present

## 2018-12-27 DIAGNOSIS — Z89511 Acquired absence of right leg below knee: Secondary | ICD-10-CM | POA: Diagnosis not present

## 2018-12-27 DIAGNOSIS — F419 Anxiety disorder, unspecified: Secondary | ICD-10-CM | POA: Diagnosis not present

## 2018-12-27 DIAGNOSIS — F09 Unspecified mental disorder due to known physiological condition: Secondary | ICD-10-CM | POA: Diagnosis not present

## 2018-12-27 DIAGNOSIS — L89892 Pressure ulcer of other site, stage 2: Secondary | ICD-10-CM | POA: Diagnosis not present

## 2018-12-27 DIAGNOSIS — L89154 Pressure ulcer of sacral region, stage 4: Secondary | ICD-10-CM | POA: Diagnosis not present

## 2018-12-27 DIAGNOSIS — G35 Multiple sclerosis: Secondary | ICD-10-CM | POA: Diagnosis not present

## 2018-12-27 DIAGNOSIS — E7800 Pure hypercholesterolemia, unspecified: Secondary | ICD-10-CM | POA: Diagnosis not present

## 2018-12-27 DIAGNOSIS — E785 Hyperlipidemia, unspecified: Secondary | ICD-10-CM | POA: Diagnosis not present

## 2018-12-27 DIAGNOSIS — F325 Major depressive disorder, single episode, in full remission: Secondary | ICD-10-CM | POA: Diagnosis not present

## 2018-12-27 NOTE — Telephone Encounter (Signed)
Called for COVID screening.  Everything negative. Bryley Chrisman K. Olena Heckle NP

## 2018-12-27 NOTE — Progress Notes (Signed)
[facility] Consult Note Telephone: [phone]  Fax: [phone]  PATIENT NAME: [name] DOB: 1946-03-06 MRN: [id number]  PRIMARY CARE PROVIDER:  Maurice Small REFERRING PROVIDER:  Maurice Small, MD [address] [place], [place] [address]  RESPONSIBLE PARTY:   Charletha Dalpe, son [phone]    RECOMMENDATIONS and PLAN:  1. MS.  Patient has had MS for several years and is practically paralyzed in her lower extremities.  Son states that she was able to stand and pivot, which helped when he toileted her and had to clean her up.  In January patient had right BKA after wound to right ankle got infected resulting in osteomyelitis.  Son does state that earlier this week she had fitting for prosthesis for right leg and next week she also has another visit.  Son is hoping after PT she will be able to help with stand and pivot again.  2.  Wound Care.  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  Spoke with home health nurse who states that she is using calcium alginate on the wound and is recommending a referral to wound clinic.  I concur with using a wound clinic to help patient and son manage the wound so that it does not get infected.  Recommended repositioning every 2 hours.  Son does state that home health nurse was going to ask PCP for order for hospital bed with air mattress.  This would be quite beneficial to help relieve some of the pressure off the wound to help with healing and prevent future wounds  3.  Trigeminal neuralgia.  Patient at times will have difficulty chewing secondary to her trigeminal neuralgia.  Son states that her appetite is good and when her neuralgia he feeds her soft foods and makes sure she drinks Ensure.  States that she did lose some weight while in the hospital because she looks thinner but overall since being home appetite has been good.  4.  Caregiver strain.  Son is very  overwhelmed with the extra care his mother is needing right now.  He is the only caregiver and does have an aide that comes in for 2 hours Monday through Friday. Have reached out to palliative social worker for any resources that he could benefit from.    5.  Goals of Care.  Full Code.  Son wants everything that can be done to keep his mother alive to be done.  Filled out MOST form indicating full code with full interventions and left at home.  Overall goals for patient is to heal current wound and to prevent any further wounds and to give caregiver some assistance   I spent 60 minutes providing this consultation,  from 1:00 to 2:00. More than 50% of the time in this consultation was spent coordinating communication.   HISTORY OF PRESENT ILLNESS:  [name] is a 73 y.o. year old female with multiple medical problems including MS, trigeminal neuralgia right side, HTN . Palliative Care was asked to help address goals of care.   CODE STATUS: Full Code  PPS: 40% HOSPICE ELIGIBILITY/DIAGNOSIS: TBD  PHYSICAL EXAM:  General:patient sitting in NAD though she was shifting her weight due to discomfort of sacral wound, frail appearing Extremities: no edema, right BKA Skin: has stage IV decubitus ulcer to sacrum being followed by home health RN Neurological: patient has lower extremity weakness due to MS.  Son does state that prior to  amputation she was able to stand and pivot.  At times has difficulty chewing due to trigeminal neuralgia.  Does have intermittent forgetfulness and confusion secondary to MS  PAST MEDICAL HISTORY:  Past Medical History:  Diagnosis Date  . Abnormality of gait   . Depression   . DVT (deep venous thrombosis) (HCC)    Left leg  . Dyslipidemia   . Fibroids   . Hypertension   . Memory difficulties 12/10/2014  . Multiple sclerosis (HCC)    Right sided weakness and gait disorder, bladder and bowel problems   . Neurogenic bladder   . Organic brain syndrome    Due to  MS   . Overdose of muscle relaxant    Unintentional baclofen overdose   . Trigeminal neuralgia   . Trigeminal neuralgia    Right  . Wound infection 10/12/2018    SOCIAL HX:  Social History   Tobacco Use  . Smoking status: Never Smoker  . Smokeless tobacco: Never Used  Substance Use Topics  . Alcohol use: No    ALLERGIES:  Allergies  Allergen Reactions  . Prednisone     Had gotten thrush  . Shellfish Allergy     Hives      PERTINENT MEDICATIONS:  Outpatient Encounter Medications as of 12/27/2018  Medication Sig  . acetaminophen (TYLENOL) 500 MG tablet Take 500 mg by mouth every 6 (six) hours as needed for mild pain.   Marland Kitchen amLODipine (NORVASC) 10 MG tablet Take 1 tablet (10 mg total) by mouth daily.  . APPLE CIDER VINEGAR PO Take 1 capsule by mouth daily. 450mg   . atorvastatin (LIPITOR) 40 MG tablet Take 40 mg by mouth daily.    . B Complex-C (B-COMPLEX WITH VITAMIN C) tablet Take 1 tablet by mouth daily.  . baclofen (LIORESAL) 10 MG tablet ONE TABLET TWICE DURING THE DAY AND 2 AT NIGHT (Patient taking differently: Take 10 mg by mouth 3 (three) times daily. )  . buPROPion (WELLBUTRIN XL) 300 MG 24 hr tablet Take 300 mg by mouth daily.  . carbamazepine (TEGRETOL) 200 MG tablet TAKE 1 TABLET BY MOUTH 3 TIMES A DAY AND 1/2 TABLET AT BEDTIME AT BEDTIME (Patient taking differently: Take 200-300 mg by mouth 3 (three) times daily. 300 mg in the morning and 200 mg in the afternoon and bedtime.)  . Cholecalciferol ([employer] VIT D 5000 HIGH-POTENCY PO) Take 5,000 Int'l Units/L by mouth daily.  . clotrimazole (MYCELEX) 10 MG troche Take 1 tablet (10 mg total) by mouth 5 (five) times daily.  Marland Kitchen docusate sodium (COLACE) 100 MG capsule Take 1 capsule (100 mg total) by mouth 2 (two) times daily.  Marland Kitchen donepezil (ARICEPT) 5 MG tablet TAKE 1 TABLET (5 MG TOTAL) BY MOUTH DAILY.  Marland Kitchen FIBER SELECT GUMMIES PO Take 5 g by mouth 2 (two) times daily.   . fish oil-omega-3 fatty acids 1000 MG capsule Take 2 g by  mouth 3 (three) times daily.  . hydrALAZINE (APRESOLINE) 10 MG tablet Take 10 mg by mouth 2 (two) times daily.  . Interferon Beta-1a (AVONEX PEN) 30 MCG/0.5ML AJKT Inject 30 mcg into the muscle every 7 (seven) days.  Marland Kitchen labetalol (NORMODYNE) 100 MG tablet Take 100 mg by mouth 2 (two) times daily.  . Multiple Vitamins-Minerals (MULTIVITAMINS THER. W/MINERALS) TABS Take 1 tablet by mouth daily.    Marland Kitchen oxyCODONE (OXY IR/ROXICODONE) 5 MG immediate release tablet Take 1 tablet (5 mg total) by mouth every 4 (four) hours as needed for moderate pain (pain  score 4-6).  Marland Kitchen petrolatum-hydrophilic-aloe vera (ALOE VESTA) ointment Apply topically 2 (two) times daily as needed for wound care.  . Potassium Chloride (KLOR-CON PO) Take 20 mEq by mouth 3 (three) times daily.   . pregabalin (LYRICA) 150 MG capsule Take 1 capsule (150 mg total) by mouth 2 (two) times daily.  . sertraline (ZOLOFT) 50 MG tablet Take 75 mg by mouth daily. One and one half tablet once a day in morning.   . Skin Protectants, Misc. (WHITE PETROLATUM-ZINC OXIDE) cream Apply topically as needed. (Patient taking differently: Apply 1 application topically daily as needed for dry skin or wound care. )  . tolterodine (DETROL LA) 2 MG 24 hr capsule TAKE 1 CAPSULE BY MOUTH EVERYDAY AT BEDTIME (Patient taking differently: Take 2 mg by mouth at bedtime. )  . traMADol (ULTRAM) 50 MG tablet Take 1 tablet (50 mg total) by mouth every 6 (six) hours as needed for moderate pain.   No facility-administered encounter medications on file as of 12/27/2018.      Braylen Staller Jenetta Downer, NP

## 2018-12-31 DIAGNOSIS — E7800 Pure hypercholesterolemia, unspecified: Secondary | ICD-10-CM | POA: Diagnosis not present

## 2018-12-31 DIAGNOSIS — F325 Major depressive disorder, single episode, in full remission: Secondary | ICD-10-CM | POA: Diagnosis not present

## 2018-12-31 DIAGNOSIS — L89154 Pressure ulcer of sacral region, stage 4: Secondary | ICD-10-CM | POA: Diagnosis not present

## 2018-12-31 DIAGNOSIS — G500 Trigeminal neuralgia: Secondary | ICD-10-CM | POA: Diagnosis not present

## 2018-12-31 DIAGNOSIS — G35 Multiple sclerosis: Secondary | ICD-10-CM | POA: Diagnosis not present

## 2018-12-31 DIAGNOSIS — F419 Anxiety disorder, unspecified: Secondary | ICD-10-CM | POA: Diagnosis not present

## 2018-12-31 DIAGNOSIS — F0280 Dementia in other diseases classified elsewhere without behavioral disturbance: Secondary | ICD-10-CM | POA: Diagnosis not present

## 2018-12-31 DIAGNOSIS — F09 Unspecified mental disorder due to known physiological condition: Secondary | ICD-10-CM | POA: Diagnosis not present

## 2018-12-31 DIAGNOSIS — I10 Essential (primary) hypertension: Secondary | ICD-10-CM | POA: Diagnosis not present

## 2019-01-01 DIAGNOSIS — I10 Essential (primary) hypertension: Secondary | ICD-10-CM | POA: Diagnosis not present

## 2019-01-01 DIAGNOSIS — F0280 Dementia in other diseases classified elsewhere without behavioral disturbance: Secondary | ICD-10-CM | POA: Diagnosis not present

## 2019-01-01 DIAGNOSIS — G35 Multiple sclerosis: Secondary | ICD-10-CM | POA: Diagnosis not present

## 2019-01-01 DIAGNOSIS — L89309 Pressure ulcer of unspecified buttock, unspecified stage: Secondary | ICD-10-CM | POA: Diagnosis not present

## 2019-01-01 DIAGNOSIS — F09 Unspecified mental disorder due to known physiological condition: Secondary | ICD-10-CM | POA: Diagnosis not present

## 2019-01-01 DIAGNOSIS — F325 Major depressive disorder, single episode, in full remission: Secondary | ICD-10-CM | POA: Diagnosis not present

## 2019-01-01 DIAGNOSIS — F419 Anxiety disorder, unspecified: Secondary | ICD-10-CM | POA: Diagnosis not present

## 2019-01-01 DIAGNOSIS — R32 Unspecified urinary incontinence: Secondary | ICD-10-CM | POA: Diagnosis not present

## 2019-01-01 DIAGNOSIS — L89154 Pressure ulcer of sacral region, stage 4: Secondary | ICD-10-CM | POA: Diagnosis not present

## 2019-01-01 DIAGNOSIS — G500 Trigeminal neuralgia: Secondary | ICD-10-CM | POA: Diagnosis not present

## 2019-01-01 DIAGNOSIS — E7800 Pure hypercholesterolemia, unspecified: Secondary | ICD-10-CM | POA: Diagnosis not present

## 2019-01-01 DIAGNOSIS — L8992 Pressure ulcer of unspecified site, stage 2: Secondary | ICD-10-CM | POA: Diagnosis not present

## 2019-01-02 DIAGNOSIS — E7800 Pure hypercholesterolemia, unspecified: Secondary | ICD-10-CM | POA: Diagnosis not present

## 2019-01-02 DIAGNOSIS — I10 Essential (primary) hypertension: Secondary | ICD-10-CM | POA: Diagnosis not present

## 2019-01-02 DIAGNOSIS — F0280 Dementia in other diseases classified elsewhere without behavioral disturbance: Secondary | ICD-10-CM | POA: Diagnosis not present

## 2019-01-02 DIAGNOSIS — L89154 Pressure ulcer of sacral region, stage 4: Secondary | ICD-10-CM | POA: Diagnosis not present

## 2019-01-02 DIAGNOSIS — F419 Anxiety disorder, unspecified: Secondary | ICD-10-CM | POA: Diagnosis not present

## 2019-01-02 DIAGNOSIS — G500 Trigeminal neuralgia: Secondary | ICD-10-CM | POA: Diagnosis not present

## 2019-01-02 DIAGNOSIS — G35 Multiple sclerosis: Secondary | ICD-10-CM | POA: Diagnosis not present

## 2019-01-02 DIAGNOSIS — F09 Unspecified mental disorder due to known physiological condition: Secondary | ICD-10-CM | POA: Diagnosis not present

## 2019-01-02 DIAGNOSIS — F325 Major depressive disorder, single episode, in full remission: Secondary | ICD-10-CM | POA: Diagnosis not present

## 2019-01-03 DIAGNOSIS — I10 Essential (primary) hypertension: Secondary | ICD-10-CM | POA: Diagnosis not present

## 2019-01-03 DIAGNOSIS — F09 Unspecified mental disorder due to known physiological condition: Secondary | ICD-10-CM | POA: Diagnosis not present

## 2019-01-03 DIAGNOSIS — L89154 Pressure ulcer of sacral region, stage 4: Secondary | ICD-10-CM | POA: Diagnosis not present

## 2019-01-03 DIAGNOSIS — F0280 Dementia in other diseases classified elsewhere without behavioral disturbance: Secondary | ICD-10-CM | POA: Diagnosis not present

## 2019-01-03 DIAGNOSIS — F419 Anxiety disorder, unspecified: Secondary | ICD-10-CM | POA: Diagnosis not present

## 2019-01-03 DIAGNOSIS — G500 Trigeminal neuralgia: Secondary | ICD-10-CM | POA: Diagnosis not present

## 2019-01-03 DIAGNOSIS — F325 Major depressive disorder, single episode, in full remission: Secondary | ICD-10-CM | POA: Diagnosis not present

## 2019-01-03 DIAGNOSIS — G35 Multiple sclerosis: Secondary | ICD-10-CM | POA: Diagnosis not present

## 2019-01-03 DIAGNOSIS — E7800 Pure hypercholesterolemia, unspecified: Secondary | ICD-10-CM | POA: Diagnosis not present

## 2019-01-04 DIAGNOSIS — G500 Trigeminal neuralgia: Secondary | ICD-10-CM | POA: Diagnosis not present

## 2019-01-04 DIAGNOSIS — G35 Multiple sclerosis: Secondary | ICD-10-CM | POA: Diagnosis not present

## 2019-01-04 DIAGNOSIS — E7800 Pure hypercholesterolemia, unspecified: Secondary | ICD-10-CM | POA: Diagnosis not present

## 2019-01-04 DIAGNOSIS — F09 Unspecified mental disorder due to known physiological condition: Secondary | ICD-10-CM | POA: Diagnosis not present

## 2019-01-04 DIAGNOSIS — F0280 Dementia in other diseases classified elsewhere without behavioral disturbance: Secondary | ICD-10-CM | POA: Diagnosis not present

## 2019-01-04 DIAGNOSIS — F325 Major depressive disorder, single episode, in full remission: Secondary | ICD-10-CM | POA: Diagnosis not present

## 2019-01-04 DIAGNOSIS — I10 Essential (primary) hypertension: Secondary | ICD-10-CM | POA: Diagnosis not present

## 2019-01-04 DIAGNOSIS — F419 Anxiety disorder, unspecified: Secondary | ICD-10-CM | POA: Diagnosis not present

## 2019-01-04 DIAGNOSIS — L89154 Pressure ulcer of sacral region, stage 4: Secondary | ICD-10-CM | POA: Diagnosis not present

## 2019-01-07 DIAGNOSIS — G35 Multiple sclerosis: Secondary | ICD-10-CM | POA: Diagnosis not present

## 2019-01-07 DIAGNOSIS — G500 Trigeminal neuralgia: Secondary | ICD-10-CM | POA: Diagnosis not present

## 2019-01-07 DIAGNOSIS — L89154 Pressure ulcer of sacral region, stage 4: Secondary | ICD-10-CM | POA: Diagnosis not present

## 2019-01-07 DIAGNOSIS — F09 Unspecified mental disorder due to known physiological condition: Secondary | ICD-10-CM | POA: Diagnosis not present

## 2019-01-07 DIAGNOSIS — F419 Anxiety disorder, unspecified: Secondary | ICD-10-CM | POA: Diagnosis not present

## 2019-01-07 DIAGNOSIS — F325 Major depressive disorder, single episode, in full remission: Secondary | ICD-10-CM | POA: Diagnosis not present

## 2019-01-07 DIAGNOSIS — E7800 Pure hypercholesterolemia, unspecified: Secondary | ICD-10-CM | POA: Diagnosis not present

## 2019-01-07 DIAGNOSIS — I10 Essential (primary) hypertension: Secondary | ICD-10-CM | POA: Diagnosis not present

## 2019-01-07 DIAGNOSIS — F0280 Dementia in other diseases classified elsewhere without behavioral disturbance: Secondary | ICD-10-CM | POA: Diagnosis not present

## 2019-01-09 DIAGNOSIS — L89154 Pressure ulcer of sacral region, stage 4: Secondary | ICD-10-CM | POA: Diagnosis not present

## 2019-01-09 DIAGNOSIS — F419 Anxiety disorder, unspecified: Secondary | ICD-10-CM | POA: Diagnosis not present

## 2019-01-09 DIAGNOSIS — F325 Major depressive disorder, single episode, in full remission: Secondary | ICD-10-CM | POA: Diagnosis not present

## 2019-01-09 DIAGNOSIS — F0280 Dementia in other diseases classified elsewhere without behavioral disturbance: Secondary | ICD-10-CM | POA: Diagnosis not present

## 2019-01-09 DIAGNOSIS — G500 Trigeminal neuralgia: Secondary | ICD-10-CM | POA: Diagnosis not present

## 2019-01-09 DIAGNOSIS — F09 Unspecified mental disorder due to known physiological condition: Secondary | ICD-10-CM | POA: Diagnosis not present

## 2019-01-09 DIAGNOSIS — E7800 Pure hypercholesterolemia, unspecified: Secondary | ICD-10-CM | POA: Diagnosis not present

## 2019-01-09 DIAGNOSIS — G35 Multiple sclerosis: Secondary | ICD-10-CM | POA: Diagnosis not present

## 2019-01-09 DIAGNOSIS — I10 Essential (primary) hypertension: Secondary | ICD-10-CM | POA: Diagnosis not present

## 2019-01-10 DIAGNOSIS — F0280 Dementia in other diseases classified elsewhere without behavioral disturbance: Secondary | ICD-10-CM | POA: Diagnosis not present

## 2019-01-10 DIAGNOSIS — I10 Essential (primary) hypertension: Secondary | ICD-10-CM | POA: Diagnosis not present

## 2019-01-10 DIAGNOSIS — L89154 Pressure ulcer of sacral region, stage 4: Secondary | ICD-10-CM | POA: Diagnosis not present

## 2019-01-10 DIAGNOSIS — E7800 Pure hypercholesterolemia, unspecified: Secondary | ICD-10-CM | POA: Diagnosis not present

## 2019-01-10 DIAGNOSIS — F325 Major depressive disorder, single episode, in full remission: Secondary | ICD-10-CM | POA: Diagnosis not present

## 2019-01-10 DIAGNOSIS — G500 Trigeminal neuralgia: Secondary | ICD-10-CM | POA: Diagnosis not present

## 2019-01-10 DIAGNOSIS — G35 Multiple sclerosis: Secondary | ICD-10-CM | POA: Diagnosis not present

## 2019-01-10 DIAGNOSIS — F419 Anxiety disorder, unspecified: Secondary | ICD-10-CM | POA: Diagnosis not present

## 2019-01-10 DIAGNOSIS — F09 Unspecified mental disorder due to known physiological condition: Secondary | ICD-10-CM | POA: Diagnosis not present

## 2019-01-11 DIAGNOSIS — I10 Essential (primary) hypertension: Secondary | ICD-10-CM | POA: Diagnosis not present

## 2019-01-11 DIAGNOSIS — G35 Multiple sclerosis: Secondary | ICD-10-CM | POA: Diagnosis not present

## 2019-01-11 DIAGNOSIS — F0280 Dementia in other diseases classified elsewhere without behavioral disturbance: Secondary | ICD-10-CM | POA: Diagnosis not present

## 2019-01-11 DIAGNOSIS — F419 Anxiety disorder, unspecified: Secondary | ICD-10-CM | POA: Diagnosis not present

## 2019-01-11 DIAGNOSIS — L89154 Pressure ulcer of sacral region, stage 4: Secondary | ICD-10-CM | POA: Diagnosis not present

## 2019-01-11 DIAGNOSIS — E7800 Pure hypercholesterolemia, unspecified: Secondary | ICD-10-CM | POA: Diagnosis not present

## 2019-01-11 DIAGNOSIS — G500 Trigeminal neuralgia: Secondary | ICD-10-CM | POA: Diagnosis not present

## 2019-01-11 DIAGNOSIS — F325 Major depressive disorder, single episode, in full remission: Secondary | ICD-10-CM | POA: Diagnosis not present

## 2019-01-11 DIAGNOSIS — F09 Unspecified mental disorder due to known physiological condition: Secondary | ICD-10-CM | POA: Diagnosis not present

## 2019-01-14 DIAGNOSIS — R399 Unspecified symptoms and signs involving the genitourinary system: Secondary | ICD-10-CM | POA: Diagnosis not present

## 2019-01-15 DIAGNOSIS — L89154 Pressure ulcer of sacral region, stage 4: Secondary | ICD-10-CM | POA: Diagnosis not present

## 2019-01-15 DIAGNOSIS — F0280 Dementia in other diseases classified elsewhere without behavioral disturbance: Secondary | ICD-10-CM | POA: Diagnosis not present

## 2019-01-15 DIAGNOSIS — G500 Trigeminal neuralgia: Secondary | ICD-10-CM | POA: Diagnosis not present

## 2019-01-15 DIAGNOSIS — F325 Major depressive disorder, single episode, in full remission: Secondary | ICD-10-CM | POA: Diagnosis not present

## 2019-01-15 DIAGNOSIS — E7800 Pure hypercholesterolemia, unspecified: Secondary | ICD-10-CM | POA: Diagnosis not present

## 2019-01-15 DIAGNOSIS — I10 Essential (primary) hypertension: Secondary | ICD-10-CM | POA: Diagnosis not present

## 2019-01-15 DIAGNOSIS — F09 Unspecified mental disorder due to known physiological condition: Secondary | ICD-10-CM | POA: Diagnosis not present

## 2019-01-15 DIAGNOSIS — F419 Anxiety disorder, unspecified: Secondary | ICD-10-CM | POA: Diagnosis not present

## 2019-01-15 DIAGNOSIS — G35 Multiple sclerosis: Secondary | ICD-10-CM | POA: Diagnosis not present

## 2019-01-18 DIAGNOSIS — G500 Trigeminal neuralgia: Secondary | ICD-10-CM | POA: Diagnosis not present

## 2019-01-18 DIAGNOSIS — L89154 Pressure ulcer of sacral region, stage 4: Secondary | ICD-10-CM | POA: Diagnosis not present

## 2019-01-18 DIAGNOSIS — E7800 Pure hypercholesterolemia, unspecified: Secondary | ICD-10-CM | POA: Diagnosis not present

## 2019-01-18 DIAGNOSIS — F325 Major depressive disorder, single episode, in full remission: Secondary | ICD-10-CM | POA: Diagnosis not present

## 2019-01-18 DIAGNOSIS — F419 Anxiety disorder, unspecified: Secondary | ICD-10-CM | POA: Diagnosis not present

## 2019-01-18 DIAGNOSIS — F09 Unspecified mental disorder due to known physiological condition: Secondary | ICD-10-CM | POA: Diagnosis not present

## 2019-01-18 DIAGNOSIS — I10 Essential (primary) hypertension: Secondary | ICD-10-CM | POA: Diagnosis not present

## 2019-01-18 DIAGNOSIS — G35 Multiple sclerosis: Secondary | ICD-10-CM | POA: Diagnosis not present

## 2019-01-18 DIAGNOSIS — F0280 Dementia in other diseases classified elsewhere without behavioral disturbance: Secondary | ICD-10-CM | POA: Diagnosis not present

## 2019-01-21 DIAGNOSIS — G35 Multiple sclerosis: Secondary | ICD-10-CM | POA: Diagnosis not present

## 2019-01-21 DIAGNOSIS — E7800 Pure hypercholesterolemia, unspecified: Secondary | ICD-10-CM | POA: Diagnosis not present

## 2019-01-21 DIAGNOSIS — F09 Unspecified mental disorder due to known physiological condition: Secondary | ICD-10-CM | POA: Diagnosis not present

## 2019-01-21 DIAGNOSIS — G500 Trigeminal neuralgia: Secondary | ICD-10-CM | POA: Diagnosis not present

## 2019-01-21 DIAGNOSIS — F0280 Dementia in other diseases classified elsewhere without behavioral disturbance: Secondary | ICD-10-CM | POA: Diagnosis not present

## 2019-01-21 DIAGNOSIS — F419 Anxiety disorder, unspecified: Secondary | ICD-10-CM | POA: Diagnosis not present

## 2019-01-21 DIAGNOSIS — L89154 Pressure ulcer of sacral region, stage 4: Secondary | ICD-10-CM | POA: Diagnosis not present

## 2019-01-21 DIAGNOSIS — I10 Essential (primary) hypertension: Secondary | ICD-10-CM | POA: Diagnosis not present

## 2019-01-21 DIAGNOSIS — F325 Major depressive disorder, single episode, in full remission: Secondary | ICD-10-CM | POA: Diagnosis not present

## 2019-01-23 ENCOUNTER — Telehealth: Payer: Self-pay

## 2019-01-23 NOTE — Telephone Encounter (Signed)
Patient contacted for Palliative Care visit.  Due to the current COVID-19 infection/crises, the patient and family prefer, and have given their verbal consent for, a provider visit via telemedicine. HIPPA policies of confidentially were discussed and patient/family expressed understanding.  Visit scheduled for 01/24/2019 @ 1pm

## 2019-01-24 ENCOUNTER — Other Ambulatory Visit: Payer: Medicare HMO | Admitting: Adult Health Nurse Practitioner

## 2019-01-24 ENCOUNTER — Other Ambulatory Visit: Payer: Self-pay

## 2019-01-24 DIAGNOSIS — Z515 Encounter for palliative care: Secondary | ICD-10-CM

## 2019-01-24 NOTE — Progress Notes (Signed)
[facility] Consult Note Telephone: [phone]  Fax: [phone]  PATIENT NAME: [name] DOB: 02-17-1946 MRN: [id number]  PRIMARY CARE PROVIDER:  Maurice Chen REFERRING PROVIDER:  Maurice Small, MD [address] [place], [place] [address]  RESPONSIBLE PARTY:   [name], son [phone]    Due to the COVID-19 crisis, this visit was done via telemedicine and it was initiated and consent by this patient and or family.  Video-audio (telehealth) contact was unable to be done due technical barriers from the patient's side  RECOMMENDATIONS and PLAN:  1. MS.  Patient has had MS for several years and is practically paralyzed in her lower extremities.  She had right BKA in January this year.  PT was working with her but discharged her because she does not have prothesis yet and she was not improving much.  Son does state that she was less engaged and more drowsy.  Discussed with PCP who put her on an antibiotic for possible UTI.  Son states that she improved after taking the antibiotics and her urine is more yellow instead of the brownish color it was.  Son states that she has appointment on Monday for prosthesis fitting.    2.  Wound Care.  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  Son states that it has improved considerably and is much smaller now.  States that the [facility] RN comes in twice a week to change dressings.  Patient now has alternating pressure mattress and cushion for her wheelchair.  Continue current wound management   3.  Trigeminal neuralgia.  Patient at times will have difficulty chewing secondary to her trigeminal neuralgia.  Son states that her appetite is good and when her neuralgia is acting up he feeds her soft foods and makes sure she drinks Ensure.  Son states that she does eat good when it is something she wants to eat.  She does have some days that she doesn't want to eat and he makes  sure she drinks Ensure.  Does state that her neuralgia has not been acting up for a while.  Does not have means to weigh her at home but has not noticed any obvious weight loss.  4. Goals of care.  Patient is full code.  No changes today.  Son states that he wants to update her POA but has not been leaving the house since the statewide stay at home order.  Have encouraged him to call to see if they are using alternatives, such as virtual methods, for getting things like this done.  I spent 25 minutes providing this consultation,  from 1:00 to 1:25. More than 50% of the time in this consultation was spent coordinating communication.   HISTORY OF PRESENT ILLNESS:  [name] is a 73 y.o. year old female with multiple medical problems including MS, trigeminal neuralgia right side, HTN . Palliative Care was asked to help address goals of care.   CODE STATUS: Full code  PPS: 40% HOSPICE ELIGIBILITY/DIAGNOSIS: TBD  PAST MEDICAL HISTORY:  Past Medical History:  Diagnosis Date  . Abnormality of gait   . Depression   . DVT (deep venous thrombosis) (HCC)    Left leg  . Dyslipidemia   . Fibroids   . Hypertension   . Memory difficulties 12/10/2014  . Multiple sclerosis (HCC)    Right sided weakness and gait disorder, bladder and bowel problems   . Neurogenic bladder   .  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 Overdose of muscle relaxant    Unintentional baclofen overdose   . Trigeminal neuralgia   . Trigeminal neuralgia    Right  . Wound infection 10/12/2018    SOCIAL HX:  Social History   Tobacco Use  . Smoking status: Never Smoker  . Smokeless tobacco: Never Used  Substance Use Topics  . Alcohol use: No    ALLERGIES:  Allergies  Allergen Reactions  . Prednisone     Had gotten thrush  . Shellfish Allergy     Hives      PERTINENT MEDICATIONS:  Outpatient Encounter Medications as of 01/24/2019  Medication Sig  . acetaminophen (TYLENOL) 500 MG tablet Take 500 mg by  mouth every 6 (six) hours as needed for mild pain.   Marland Kitchen amLODipine (NORVASC) 10 MG tablet Take 1 tablet (10 mg total) by mouth daily.  . APPLE CIDER VINEGAR PO Take 1 capsule by mouth daily. 450mg   . atorvastatin (LIPITOR) 40 MG tablet Take 40 mg by mouth daily.    . B Complex-C (B-COMPLEX WITH VITAMIN C) tablet Take 1 tablet by mouth daily.  . baclofen (LIORESAL) 10 MG tablet ONE TABLET TWICE DURING THE DAY AND 2 AT NIGHT (Patient taking differently: Take 10 mg by mouth 3 (three) times daily. )  . buPROPion (WELLBUTRIN XL) 300 MG 24 hr tablet Take 300 mg by mouth daily.  . carbamazepine (TEGRETOL) 200 MG tablet TAKE 1 TABLET BY MOUTH 3 TIMES A DAY AND 1/2 TABLET AT BEDTIME AT BEDTIME (Patient taking differently: Take 200-300 mg by mouth 3 (three) times daily. 300 mg in the morning and 200 mg in the afternoon and bedtime.)  . Cholecalciferol (CVS VIT D 5000 HIGH-POTENCY PO) Take 5,000 Int'l Units/L by mouth daily.  . clotrimazole (MYCELEX) 10 MG troche Take 1 tablet (10 mg total) by mouth 5 (five) times daily.  Marland Kitchen docusate sodium (COLACE) 100 MG capsule Take 1 capsule (100 mg total) by mouth 2 (two) times daily.  Marland Kitchen donepezil (ARICEPT) 5 MG tablet TAKE 1 TABLET (5 MG TOTAL) BY MOUTH DAILY.  Marland Kitchen FIBER SELECT GUMMIES PO Take 5 g by mouth 2 (two) times daily.   . fish oil-omega-3 fatty acids 1000 MG capsule Take 2 g by mouth 3 (three) times daily.  . hydrALAZINE (APRESOLINE) 10 MG tablet Take 10 mg by mouth 2 (two) times daily.  . Interferon Beta-1a (AVONEX PEN) 30 MCG/0.5ML AJKT Inject 30 mcg into the muscle every 7 (seven) days.  Marland Kitchen labetalol (NORMODYNE) 100 MG tablet Take 100 mg by mouth 2 (two) times daily.  . Multiple Vitamins-Minerals (MULTIVITAMINS THER. W/MINERALS) TABS Take 1 tablet by mouth daily.    Marland Kitchen oxyCODONE (OXY IR/ROXICODONE) 5 MG immediate release tablet Take 1 tablet (5 mg total) by mouth every 4 (four) hours as needed for moderate pain (pain score 4-6).  Marland Kitchen petrolatum-hydrophilic-aloe  vera (ALOE VESTA) ointment Apply topically 2 (two) times daily as needed for wound care.  . Potassium Chloride (KLOR-CON PO) Take 20 mEq by mouth 3 (three) times daily.   . pregabalin (LYRICA) 150 MG capsule Take 1 capsule (150 mg total) by mouth 2 (two) times daily.  . sertraline (ZOLOFT) 50 MG tablet Take 75 mg by mouth daily. One and one half tablet once a day in morning.   . Skin Protectants, Misc. (WHITE PETROLATUM-ZINC OXIDE) cream Apply topically as needed. (Patient taking differently: Apply 1 application topically daily as needed for dry  skin or wound care. )  . tolterodine (DETROL LA) 2 MG 24 hr capsule TAKE 1 CAPSULE BY MOUTH EVERYDAY AT BEDTIME (Patient taking differently: Take 2 mg by mouth at bedtime. )  . traMADol (ULTRAM) 50 MG tablet Take 1 tablet (50 mg total) by mouth every 6 (six) hours as needed for moderate pain.   No facility-administered encounter medications on file as of 01/24/2019.        Laurita Peron Jenetta Downer, NP

## 2019-01-25 DIAGNOSIS — G35 Multiple sclerosis: Secondary | ICD-10-CM | POA: Diagnosis not present

## 2019-01-25 DIAGNOSIS — L89154 Pressure ulcer of sacral region, stage 4: Secondary | ICD-10-CM | POA: Diagnosis not present

## 2019-01-25 DIAGNOSIS — F419 Anxiety disorder, unspecified: Secondary | ICD-10-CM | POA: Diagnosis not present

## 2019-01-25 DIAGNOSIS — F09 Unspecified mental disorder due to known physiological condition: Secondary | ICD-10-CM | POA: Diagnosis not present

## 2019-01-25 DIAGNOSIS — F325 Major depressive disorder, single episode, in full remission: Secondary | ICD-10-CM | POA: Diagnosis not present

## 2019-01-25 DIAGNOSIS — F0280 Dementia in other diseases classified elsewhere without behavioral disturbance: Secondary | ICD-10-CM | POA: Diagnosis not present

## 2019-01-25 DIAGNOSIS — E7800 Pure hypercholesterolemia, unspecified: Secondary | ICD-10-CM | POA: Diagnosis not present

## 2019-01-25 DIAGNOSIS — G500 Trigeminal neuralgia: Secondary | ICD-10-CM | POA: Diagnosis not present

## 2019-01-25 DIAGNOSIS — I10 Essential (primary) hypertension: Secondary | ICD-10-CM | POA: Diagnosis not present

## 2019-01-28 DIAGNOSIS — Z89511 Acquired absence of right leg below knee: Secondary | ICD-10-CM | POA: Diagnosis not present

## 2019-01-29 ENCOUNTER — Telehealth: Payer: Self-pay | Admitting: Neurology

## 2019-01-29 DIAGNOSIS — F419 Anxiety disorder, unspecified: Secondary | ICD-10-CM | POA: Diagnosis not present

## 2019-01-29 DIAGNOSIS — F0280 Dementia in other diseases classified elsewhere without behavioral disturbance: Secondary | ICD-10-CM | POA: Diagnosis not present

## 2019-01-29 DIAGNOSIS — L89154 Pressure ulcer of sacral region, stage 4: Secondary | ICD-10-CM | POA: Diagnosis not present

## 2019-01-29 DIAGNOSIS — G35 Multiple sclerosis: Secondary | ICD-10-CM | POA: Diagnosis not present

## 2019-01-29 DIAGNOSIS — F09 Unspecified mental disorder due to known physiological condition: Secondary | ICD-10-CM | POA: Diagnosis not present

## 2019-01-29 DIAGNOSIS — E7800 Pure hypercholesterolemia, unspecified: Secondary | ICD-10-CM | POA: Diagnosis not present

## 2019-01-29 DIAGNOSIS — I10 Essential (primary) hypertension: Secondary | ICD-10-CM | POA: Diagnosis not present

## 2019-01-29 DIAGNOSIS — G500 Trigeminal neuralgia: Secondary | ICD-10-CM | POA: Diagnosis not present

## 2019-01-29 DIAGNOSIS — F325 Major depressive disorder, single episode, in full remission: Secondary | ICD-10-CM | POA: Diagnosis not present

## 2019-01-29 NOTE — Telephone Encounter (Signed)
Due to current COVID 19 pandemic, our office is severely reducing in office visits for at least the next 2 weeks, in order to minimize the risk to our patients and healthcare providers. Pt understands that although there may be some limitations with this type of visit, we will take all precautions to reduce any security or privacy concerns.  Pt understands that this will be treated like an in office visit and we will file with pt's insurance, and there may be a patient responsible charge related to this service. Pt's email is [email or web address]. Pt understands that the [employer] webex software must be downloaded and operational on the device pt plans to use for the visit.  FYI spoke with pts son [id number]$) to schedule appt said he would be able to help pt have everything set up.

## 2019-01-30 ENCOUNTER — Encounter: Payer: Self-pay | Admitting: Neurology

## 2019-01-30 NOTE — Addendum Note (Signed)
Addended by: Verlin Grills T on: 01/30/2019 01:39 PM   Modules accepted: Orders

## 2019-01-30 NOTE — Telephone Encounter (Signed)
I contacted the pt's son and updated allergies meds and PMH for virtual video visit for tomorrow with Dr. Margette Fast.

## 2019-01-31 ENCOUNTER — Encounter: Payer: Self-pay | Admitting: Neurology

## 2019-01-31 ENCOUNTER — Ambulatory Visit (INDEPENDENT_AMBULATORY_CARE_PROVIDER_SITE_OTHER): Payer: Medicare HMO | Admitting: Neurology

## 2019-01-31 ENCOUNTER — Other Ambulatory Visit: Payer: Self-pay

## 2019-01-31 DIAGNOSIS — F09 Unspecified mental disorder due to known physiological condition: Secondary | ICD-10-CM

## 2019-01-31 DIAGNOSIS — G35 Multiple sclerosis: Secondary | ICD-10-CM

## 2019-01-31 DIAGNOSIS — G500 Trigeminal neuralgia: Secondary | ICD-10-CM

## 2019-01-31 MED ORDER — PREGABALIN 100 MG PO CAPS: 100.0000 mg | ORAL_CAPSULE | Freq: Two times a day (BID) | ORAL | 5 refills | Status: DC

## 2019-01-31 NOTE — Progress Notes (Signed)
     Virtual Visit via Video Note  I connected with [name] on 01/31/19 at  1:30 PM EDT by a video enabled telemedicine application and verified that I am speaking with the correct person using two identifiers.   I discussed the limitations of evaluation and management by telemedicine and the availability of in person appointments. The patient expressed understanding and agreed to proceed.  The patient is at home, physician in office.  History of Present Illness: [name] is a 73 year old right-handed black female with a history of multiple sclerosis on Avonex.  She has had a an ongoing progressive process with her multiple sclerosis, she has developed a severe gait disorder and a dementia.  She lives at home with her son, her son is her caretaker, she does have some assistance coming in 10 hours a week as well.  She has a history of trigeminal neuralgia that may flare on occasion, more recently this has been doing well.  She is treated with Lyrica and carbamazepine for this.  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  She has not been able to regain her ability to stand up, she just recently got a prosthetic limb.  She is not eating well, she has become more zoned out, she may drool more.  The patient is overall more confused.  She will take in fluids fairly well, but she only eats about 1 good meal a day and may get Ensure for breakfast and lunch.  She has not had any falls.   Observations/Objective: The WebEx evaluation reveals that the patient appears to be alert but is confused, minimally verbal.  No dysarthria is noted.  Extraocular movements are full.  The patient is unable to ambulate.  Assessment and Plan: 1.  Multiple sclerosis  2.  Dementia  3.  Gait disorder  4.  Trigeminal neuralgia  The patient has had some worsening of her mental status, she appears to be zoned out, not responding as much as normal.  This likely is a medication side  effect, she is on a multitude of medications that may result in drowsiness and confusion including carbamazepine, baclofen, Lyrica, and oxybutynin.  We will reduce the Lyrica dose to 100 mg twice daily.  She will follow-up in 4 months, we will need to check a carbamazepine level at that time.  The son will contact our office if the trigeminal neuralgia worsens on the lower dose of Lyrica.  Follow Up Instructions: 71-month follow-up, may see nurse practitioner.   I discussed the assessment and treatment plan with the patient. The patient was provided an opportunity to ask questions and all were answered. The patient agreed with the plan and demonstrated an understanding of the instructions.   The patient was advised to call back or seek an in-person evaluation if the symptoms worsen or if the condition fails to improve as anticipated.  I provided 25 minutes of non-face-to-face time during this encounter.   Kathrynn Ducking, MD

## 2019-02-01 DIAGNOSIS — I10 Essential (primary) hypertension: Secondary | ICD-10-CM | POA: Diagnosis not present

## 2019-02-01 DIAGNOSIS — L89309 Pressure ulcer of unspecified buttock, unspecified stage: Secondary | ICD-10-CM | POA: Diagnosis not present

## 2019-02-01 DIAGNOSIS — F09 Unspecified mental disorder due to known physiological condition: Secondary | ICD-10-CM | POA: Diagnosis not present

## 2019-02-01 DIAGNOSIS — F419 Anxiety disorder, unspecified: Secondary | ICD-10-CM | POA: Diagnosis not present

## 2019-02-01 DIAGNOSIS — R32 Unspecified urinary incontinence: Secondary | ICD-10-CM | POA: Diagnosis not present

## 2019-02-01 DIAGNOSIS — E7800 Pure hypercholesterolemia, unspecified: Secondary | ICD-10-CM | POA: Diagnosis not present

## 2019-02-01 DIAGNOSIS — F0280 Dementia in other diseases classified elsewhere without behavioral disturbance: Secondary | ICD-10-CM | POA: Diagnosis not present

## 2019-02-01 DIAGNOSIS — L89154 Pressure ulcer of sacral region, stage 4: Secondary | ICD-10-CM | POA: Diagnosis not present

## 2019-02-01 DIAGNOSIS — F325 Major depressive disorder, single episode, in full remission: Secondary | ICD-10-CM | POA: Diagnosis not present

## 2019-02-01 DIAGNOSIS — L8992 Pressure ulcer of unspecified site, stage 2: Secondary | ICD-10-CM | POA: Diagnosis not present

## 2019-02-01 DIAGNOSIS — G500 Trigeminal neuralgia: Secondary | ICD-10-CM | POA: Diagnosis not present

## 2019-02-01 DIAGNOSIS — G35 Multiple sclerosis: Secondary | ICD-10-CM | POA: Diagnosis not present

## 2019-02-05 DIAGNOSIS — F419 Anxiety disorder, unspecified: Secondary | ICD-10-CM | POA: Diagnosis not present

## 2019-02-05 DIAGNOSIS — G500 Trigeminal neuralgia: Secondary | ICD-10-CM | POA: Diagnosis not present

## 2019-02-05 DIAGNOSIS — F325 Major depressive disorder, single episode, in full remission: Secondary | ICD-10-CM | POA: Diagnosis not present

## 2019-02-05 DIAGNOSIS — I10 Essential (primary) hypertension: Secondary | ICD-10-CM | POA: Diagnosis not present

## 2019-02-05 DIAGNOSIS — G35 Multiple sclerosis: Secondary | ICD-10-CM | POA: Diagnosis not present

## 2019-02-05 DIAGNOSIS — F0280 Dementia in other diseases classified elsewhere without behavioral disturbance: Secondary | ICD-10-CM | POA: Diagnosis not present

## 2019-02-05 DIAGNOSIS — L89154 Pressure ulcer of sacral region, stage 4: Secondary | ICD-10-CM | POA: Diagnosis not present

## 2019-02-05 DIAGNOSIS — F09 Unspecified mental disorder due to known physiological condition: Secondary | ICD-10-CM | POA: Diagnosis not present

## 2019-02-05 DIAGNOSIS — E7800 Pure hypercholesterolemia, unspecified: Secondary | ICD-10-CM | POA: Diagnosis not present

## 2019-02-07 ENCOUNTER — Other Ambulatory Visit: Payer: Self-pay

## 2019-02-07 ENCOUNTER — Other Ambulatory Visit: Payer: Medicare HMO | Admitting: Adult Health Nurse Practitioner

## 2019-02-07 DIAGNOSIS — E7800 Pure hypercholesterolemia, unspecified: Secondary | ICD-10-CM | POA: Diagnosis not present

## 2019-02-07 DIAGNOSIS — F419 Anxiety disorder, unspecified: Secondary | ICD-10-CM | POA: Diagnosis not present

## 2019-02-07 DIAGNOSIS — Z515 Encounter for palliative care: Secondary | ICD-10-CM | POA: Diagnosis not present

## 2019-02-07 DIAGNOSIS — L89154 Pressure ulcer of sacral region, stage 4: Secondary | ICD-10-CM | POA: Diagnosis not present

## 2019-02-07 DIAGNOSIS — F325 Major depressive disorder, single episode, in full remission: Secondary | ICD-10-CM | POA: Diagnosis not present

## 2019-02-07 DIAGNOSIS — G500 Trigeminal neuralgia: Secondary | ICD-10-CM | POA: Diagnosis not present

## 2019-02-07 DIAGNOSIS — I10 Essential (primary) hypertension: Secondary | ICD-10-CM | POA: Diagnosis not present

## 2019-02-07 DIAGNOSIS — G35 Multiple sclerosis: Secondary | ICD-10-CM | POA: Diagnosis not present

## 2019-02-07 DIAGNOSIS — F0280 Dementia in other diseases classified elsewhere without behavioral disturbance: Secondary | ICD-10-CM | POA: Diagnosis not present

## 2019-02-07 DIAGNOSIS — F09 Unspecified mental disorder due to known physiological condition: Secondary | ICD-10-CM | POA: Diagnosis not present

## 2019-02-07 NOTE — Progress Notes (Signed)
[facility] Consult Note Telephone: [phone]  Fax: [phone]  PATIENT NAME: [name] DOB: 1946/01/01 MRN: [id number]  PRIMARY CARE PROVIDER:   Maurice Small, MD  REFERRING PROVIDER:  Maurice Small, MD [address] [place], [place] [address]  RESPONSIBLE PARTY:   Jozlin Bently, son [phone]    Due to the COVID-19 crisis, this visit was done via telemedicine and it was initiated and consent by this patient and or family.  Video-audio (telehealth) contact was unable to be done due technical barriers from the patient's side      RECOMMENDATIONS and PLAN:  1.  MS. Patient has had MS for several years and is practically paralyzed in her lower extremities. She had right BKA in January this year.  Son states that she now has her prosthesis.  States that [facility] nurse has encouraged that they continue PT after her wound heals.  Son states that she is able to tolerate wearing the prosthesis and it is not causing any skin breakdown.    2. Wound Care. 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  Son states that some days the wound is bigger than other days when the wound nurse comes to change the dressing. States that the nurse has recommended some things for them to do, such as not sitting up in wheelchair as much and repositioning to keep weight off the wound for better healing.  States that the [facility] RN comes in twice a week to change dressings.  Patient has alternating pressure mattress and cushion for her wheelchair.  Continue current wound management   3. Trigeminal neuralgia. Patient at times will have difficulty chewing secondary to her trigeminal neuralgia. Son states that her appetite is good and when her neuralgia is acting up he feeds her soft foods and makes sure she drinks Ensure. Son states that she has days she eats better than others.  States that the past few days she has eaten good and he  has been giving her protein shakes as well. Neurologist has suggested reducing lyrica to 100 mg PO BID.  Son states that he has not started doing this yet as she complains of her jaw hurting in the mornings.  The reason for reducing the lyrica is due to her sleeping more.  Son states that she is sleeping more because she is in bed more to keep weight off the wound.  He states that he is going to try to start decreasing the lyrica this weekend  Overall son has no new concerns and states that she is doing well.  Have appointment scheduled for Mar 07, 2019 at 1pm.      I spent 30 minutes providing this consultation,  from 1:00 to 1:30. More than 50% of the time in this consultation was spent coordinating communication.   HISTORY OF PRESENT ILLNESS:  [name] is a 73 y.o. year old female with multiple medical problems including MS, trigeminal neuralgia right side, HTN. Palliative Care was asked to help address goals of care.   CODE STATUS: Full code  PPS: 40% HOSPICE ELIGIBILITY/DIAGNOSIS: TBD  PAST MEDICAL HISTORY:  Past Medical History:  Diagnosis Date  . Abnormality of gait   . Depression   . DVT (deep venous thrombosis) (HCC)    Left leg  . Dyslipidemia   . Fibroids   . Hypertension   . Memory difficulties 12/10/2014  . Multiple sclerosis (HCC)    Right  sided weakness and gait disorder, bladder and bowel problems   . Neurogenic bladder   . 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 Overdose of muscle relaxant    Unintentional baclofen overdose   . Trigeminal neuralgia   . Trigeminal neuralgia    Right  . Wound infection 10/12/2018    SOCIAL HX:  Social History   Tobacco Use  . Smoking status: Never Smoker  . Smokeless tobacco: Never Used  Substance Use Topics  . Alcohol use: No    ALLERGIES:  Allergies  Allergen Reactions  . Prednisone     Had gotten thrush  . Shellfish Allergy     Hives      PERTINENT MEDICATIONS:  Outpatient Encounter Medications as  of 02/07/2019  Medication Sig  . acetaminophen (TYLENOL) 500 MG tablet Take 500 mg by mouth every 6 (six) hours as needed for mild pain.   Marland Kitchen amLODipine (NORVASC) 10 MG tablet Take 1 tablet (10 mg total) by mouth daily.  . APPLE CIDER VINEGAR PO Take 1 capsule by mouth daily. 450mg   . atorvastatin (LIPITOR) 40 MG tablet Take 40 mg by mouth daily.    . B Complex-C (B-COMPLEX WITH VITAMIN C) tablet Take 1 tablet by mouth daily.  . baclofen (LIORESAL) 10 MG tablet ONE TABLET TWICE DURING THE DAY AND 2 AT NIGHT (Patient taking differently: Take 10 mg by mouth 4 (four) times daily. )  . buPROPion (WELLBUTRIN XL) 300 MG 24 hr tablet Take 300 mg by mouth daily.  . carbamazepine (TEGRETOL) 200 MG tablet TAKE 1 TABLET BY MOUTH 3 TIMES A DAY AND 1/2 TABLET AT BEDTIME AT BEDTIME (Patient taking differently: Take 200-300 mg by mouth 3 (three) times daily. 300 mg in the morning and 200 mg in the afternoon and bedtime.)  . Cholecalciferol (CVS VIT D 5000 HIGH-POTENCY PO) Take 5,000 Int'l Units/L by mouth daily.  Marland Kitchen CRANBERRY PO Take by mouth.  . Diaper Rash Products (DESITIN CLEAR EX) Apply topically.  . docusate sodium (COLACE) 100 MG capsule Take 1 capsule (100 mg total) by mouth 2 (two) times daily.  Marland Kitchen donepezil (ARICEPT) 5 MG tablet TAKE 1 TABLET (5 MG TOTAL) BY MOUTH DAILY.  Marland Kitchen FIBER SELECT GUMMIES PO Take 5 g by mouth 2 (two) times daily.   . fish oil-omega-3 fatty acids 1000 MG capsule Take 2 g by mouth 3 (three) times daily.  . hydrochlorothiazide (HYDRODIURIL) 25 MG tablet TAKE 1 TABLET BY MOUTH EVERY DAY IN THE MORNING  . Interferon Beta-1a (AVONEX PEN) 30 MCG/0.5ML AJKT Inject 30 mcg into the muscle every 7 (seven) days.  Marland Kitchen labetalol (NORMODYNE) 100 MG tablet Take 100 mg by mouth 2 (two) times daily.  . Multiple Vitamins-Minerals (MULTIVITAMINS THER. W/MINERALS) TABS Take 1 tablet by mouth daily.    . Potassium Chloride (KLOR-CON PO) Take 20 mEq by mouth 3 (three) times daily.   . pregabalin (LYRICA)  100 MG capsule Take 1 capsule (100 mg total) by mouth 2 (two) times daily.  . Probiotic Product (PROBIOTIC PO) Take by mouth.  . sertraline (ZOLOFT) 50 MG tablet Take 75 mg by mouth daily. One and one half tablet once a day in morning.   . Skin Protectants, Misc. (WHITE PETROLATUM-ZINC OXIDE) cream Apply topically as needed. (Patient taking differently: Apply 1 application topically daily as needed for dry skin or wound care. )  . tolterodine (DETROL LA) 2 MG 24 hr capsule TAKE 1 CAPSULE BY MOUTH EVERYDAY AT BEDTIME (  Patient taking differently: Take 2 mg by mouth at bedtime. )  . traMADol (ULTRAM) 50 MG tablet Take 1 tablet (50 mg total) by mouth every 6 (six) hours as needed for moderate pain.  . TURMERIC PO Take 450 mg by mouth.   No facility-administered encounter medications on file as of 02/07/2019.       Treniyah Lynn Jenetta Downer, NP

## 2019-02-08 DIAGNOSIS — F325 Major depressive disorder, single episode, in full remission: Secondary | ICD-10-CM | POA: Diagnosis not present

## 2019-02-08 DIAGNOSIS — G35 Multiple sclerosis: Secondary | ICD-10-CM | POA: Diagnosis not present

## 2019-02-08 DIAGNOSIS — E7800 Pure hypercholesterolemia, unspecified: Secondary | ICD-10-CM | POA: Diagnosis not present

## 2019-02-08 DIAGNOSIS — F0280 Dementia in other diseases classified elsewhere without behavioral disturbance: Secondary | ICD-10-CM | POA: Diagnosis not present

## 2019-02-08 DIAGNOSIS — I10 Essential (primary) hypertension: Secondary | ICD-10-CM | POA: Diagnosis not present

## 2019-02-08 DIAGNOSIS — G500 Trigeminal neuralgia: Secondary | ICD-10-CM | POA: Diagnosis not present

## 2019-02-08 DIAGNOSIS — F09 Unspecified mental disorder due to known physiological condition: Secondary | ICD-10-CM | POA: Diagnosis not present

## 2019-02-08 DIAGNOSIS — L89154 Pressure ulcer of sacral region, stage 4: Secondary | ICD-10-CM | POA: Diagnosis not present

## 2019-02-08 DIAGNOSIS — F419 Anxiety disorder, unspecified: Secondary | ICD-10-CM | POA: Diagnosis not present

## 2019-02-12 DIAGNOSIS — F419 Anxiety disorder, unspecified: Secondary | ICD-10-CM | POA: Diagnosis not present

## 2019-02-12 DIAGNOSIS — G35 Multiple sclerosis: Secondary | ICD-10-CM | POA: Diagnosis not present

## 2019-02-12 DIAGNOSIS — I10 Essential (primary) hypertension: Secondary | ICD-10-CM | POA: Diagnosis not present

## 2019-02-12 DIAGNOSIS — G500 Trigeminal neuralgia: Secondary | ICD-10-CM | POA: Diagnosis not present

## 2019-02-12 DIAGNOSIS — F09 Unspecified mental disorder due to known physiological condition: Secondary | ICD-10-CM | POA: Diagnosis not present

## 2019-02-12 DIAGNOSIS — L89154 Pressure ulcer of sacral region, stage 4: Secondary | ICD-10-CM | POA: Diagnosis not present

## 2019-02-12 DIAGNOSIS — F325 Major depressive disorder, single episode, in full remission: Secondary | ICD-10-CM | POA: Diagnosis not present

## 2019-02-12 DIAGNOSIS — F0280 Dementia in other diseases classified elsewhere without behavioral disturbance: Secondary | ICD-10-CM | POA: Diagnosis not present

## 2019-02-12 DIAGNOSIS — E7800 Pure hypercholesterolemia, unspecified: Secondary | ICD-10-CM | POA: Diagnosis not present

## 2019-02-15 DIAGNOSIS — L89154 Pressure ulcer of sacral region, stage 4: Secondary | ICD-10-CM | POA: Diagnosis not present

## 2019-02-15 DIAGNOSIS — F325 Major depressive disorder, single episode, in full remission: Secondary | ICD-10-CM | POA: Diagnosis not present

## 2019-02-15 DIAGNOSIS — G35 Multiple sclerosis: Secondary | ICD-10-CM | POA: Diagnosis not present

## 2019-02-15 DIAGNOSIS — F419 Anxiety disorder, unspecified: Secondary | ICD-10-CM | POA: Diagnosis not present

## 2019-02-15 DIAGNOSIS — E7800 Pure hypercholesterolemia, unspecified: Secondary | ICD-10-CM | POA: Diagnosis not present

## 2019-02-15 DIAGNOSIS — G500 Trigeminal neuralgia: Secondary | ICD-10-CM | POA: Diagnosis not present

## 2019-02-15 DIAGNOSIS — F09 Unspecified mental disorder due to known physiological condition: Secondary | ICD-10-CM | POA: Diagnosis not present

## 2019-02-15 DIAGNOSIS — F0280 Dementia in other diseases classified elsewhere without behavioral disturbance: Secondary | ICD-10-CM | POA: Diagnosis not present

## 2019-02-15 DIAGNOSIS — I10 Essential (primary) hypertension: Secondary | ICD-10-CM | POA: Diagnosis not present

## 2019-02-19 DIAGNOSIS — G35 Multiple sclerosis: Secondary | ICD-10-CM | POA: Diagnosis not present

## 2019-02-19 DIAGNOSIS — E7800 Pure hypercholesterolemia, unspecified: Secondary | ICD-10-CM | POA: Diagnosis not present

## 2019-02-19 DIAGNOSIS — F325 Major depressive disorder, single episode, in full remission: Secondary | ICD-10-CM | POA: Diagnosis not present

## 2019-02-19 DIAGNOSIS — F09 Unspecified mental disorder due to known physiological condition: Secondary | ICD-10-CM | POA: Diagnosis not present

## 2019-02-19 DIAGNOSIS — G500 Trigeminal neuralgia: Secondary | ICD-10-CM | POA: Diagnosis not present

## 2019-02-19 DIAGNOSIS — L89154 Pressure ulcer of sacral region, stage 4: Secondary | ICD-10-CM | POA: Diagnosis not present

## 2019-02-19 DIAGNOSIS — F0280 Dementia in other diseases classified elsewhere without behavioral disturbance: Secondary | ICD-10-CM | POA: Diagnosis not present

## 2019-02-19 DIAGNOSIS — I10 Essential (primary) hypertension: Secondary | ICD-10-CM | POA: Diagnosis not present

## 2019-02-19 DIAGNOSIS — F419 Anxiety disorder, unspecified: Secondary | ICD-10-CM | POA: Diagnosis not present

## 2019-02-21 DIAGNOSIS — G35 Multiple sclerosis: Secondary | ICD-10-CM | POA: Diagnosis not present

## 2019-02-21 DIAGNOSIS — G500 Trigeminal neuralgia: Secondary | ICD-10-CM | POA: Diagnosis not present

## 2019-02-21 DIAGNOSIS — I10 Essential (primary) hypertension: Secondary | ICD-10-CM | POA: Diagnosis not present

## 2019-02-21 DIAGNOSIS — F419 Anxiety disorder, unspecified: Secondary | ICD-10-CM | POA: Diagnosis not present

## 2019-02-21 DIAGNOSIS — F325 Major depressive disorder, single episode, in full remission: Secondary | ICD-10-CM | POA: Diagnosis not present

## 2019-02-21 DIAGNOSIS — F09 Unspecified mental disorder due to known physiological condition: Secondary | ICD-10-CM | POA: Diagnosis not present

## 2019-02-21 DIAGNOSIS — L89154 Pressure ulcer of sacral region, stage 4: Secondary | ICD-10-CM | POA: Diagnosis not present

## 2019-02-21 DIAGNOSIS — F0280 Dementia in other diseases classified elsewhere without behavioral disturbance: Secondary | ICD-10-CM | POA: Diagnosis not present

## 2019-02-21 DIAGNOSIS — E7800 Pure hypercholesterolemia, unspecified: Secondary | ICD-10-CM | POA: Diagnosis not present

## 2019-02-26 DIAGNOSIS — F0280 Dementia in other diseases classified elsewhere without behavioral disturbance: Secondary | ICD-10-CM | POA: Diagnosis not present

## 2019-02-26 DIAGNOSIS — I10 Essential (primary) hypertension: Secondary | ICD-10-CM | POA: Diagnosis not present

## 2019-02-26 DIAGNOSIS — L89154 Pressure ulcer of sacral region, stage 4: Secondary | ICD-10-CM | POA: Diagnosis not present

## 2019-02-26 DIAGNOSIS — G35 Multiple sclerosis: Secondary | ICD-10-CM | POA: Diagnosis not present

## 2019-02-26 DIAGNOSIS — G500 Trigeminal neuralgia: Secondary | ICD-10-CM | POA: Diagnosis not present

## 2019-02-26 DIAGNOSIS — F419 Anxiety disorder, unspecified: Secondary | ICD-10-CM | POA: Diagnosis not present

## 2019-02-26 DIAGNOSIS — L89892 Pressure ulcer of other site, stage 2: Secondary | ICD-10-CM | POA: Diagnosis not present

## 2019-02-26 DIAGNOSIS — F325 Major depressive disorder, single episode, in full remission: Secondary | ICD-10-CM | POA: Diagnosis not present

## 2019-02-26 DIAGNOSIS — F09 Unspecified mental disorder due to known physiological condition: Secondary | ICD-10-CM | POA: Diagnosis not present

## 2019-02-28 DIAGNOSIS — G35 Multiple sclerosis: Secondary | ICD-10-CM | POA: Diagnosis not present

## 2019-02-28 DIAGNOSIS — L89892 Pressure ulcer of other site, stage 2: Secondary | ICD-10-CM | POA: Diagnosis not present

## 2019-02-28 DIAGNOSIS — L89154 Pressure ulcer of sacral region, stage 4: Secondary | ICD-10-CM | POA: Diagnosis not present

## 2019-02-28 DIAGNOSIS — I10 Essential (primary) hypertension: Secondary | ICD-10-CM | POA: Diagnosis not present

## 2019-02-28 DIAGNOSIS — F0280 Dementia in other diseases classified elsewhere without behavioral disturbance: Secondary | ICD-10-CM | POA: Diagnosis not present

## 2019-02-28 DIAGNOSIS — F09 Unspecified mental disorder due to known physiological condition: Secondary | ICD-10-CM | POA: Diagnosis not present

## 2019-02-28 DIAGNOSIS — F325 Major depressive disorder, single episode, in full remission: Secondary | ICD-10-CM | POA: Diagnosis not present

## 2019-02-28 DIAGNOSIS — F419 Anxiety disorder, unspecified: Secondary | ICD-10-CM | POA: Diagnosis not present

## 2019-02-28 DIAGNOSIS — G500 Trigeminal neuralgia: Secondary | ICD-10-CM | POA: Diagnosis not present

## 2019-03-03 DIAGNOSIS — L8992 Pressure ulcer of unspecified site, stage 2: Secondary | ICD-10-CM | POA: Diagnosis not present

## 2019-03-03 DIAGNOSIS — G35 Multiple sclerosis: Secondary | ICD-10-CM | POA: Diagnosis not present

## 2019-03-03 DIAGNOSIS — R32 Unspecified urinary incontinence: Secondary | ICD-10-CM | POA: Diagnosis not present

## 2019-03-03 DIAGNOSIS — L89309 Pressure ulcer of unspecified buttock, unspecified stage: Secondary | ICD-10-CM | POA: Diagnosis not present

## 2019-03-05 DIAGNOSIS — G35 Multiple sclerosis: Secondary | ICD-10-CM | POA: Diagnosis not present

## 2019-03-05 DIAGNOSIS — I10 Essential (primary) hypertension: Secondary | ICD-10-CM | POA: Diagnosis not present

## 2019-03-05 DIAGNOSIS — F419 Anxiety disorder, unspecified: Secondary | ICD-10-CM | POA: Diagnosis not present

## 2019-03-05 DIAGNOSIS — F09 Unspecified mental disorder due to known physiological condition: Secondary | ICD-10-CM | POA: Diagnosis not present

## 2019-03-05 DIAGNOSIS — L89892 Pressure ulcer of other site, stage 2: Secondary | ICD-10-CM | POA: Diagnosis not present

## 2019-03-05 DIAGNOSIS — F325 Major depressive disorder, single episode, in full remission: Secondary | ICD-10-CM | POA: Diagnosis not present

## 2019-03-05 DIAGNOSIS — L89154 Pressure ulcer of sacral region, stage 4: Secondary | ICD-10-CM | POA: Diagnosis not present

## 2019-03-05 DIAGNOSIS — G500 Trigeminal neuralgia: Secondary | ICD-10-CM | POA: Diagnosis not present

## 2019-03-05 DIAGNOSIS — F0280 Dementia in other diseases classified elsewhere without behavioral disturbance: Secondary | ICD-10-CM | POA: Diagnosis not present

## 2019-03-07 ENCOUNTER — Other Ambulatory Visit: Payer: Self-pay | Admitting: Adult Health Nurse Practitioner

## 2019-03-07 DIAGNOSIS — Z515 Encounter for palliative care: Secondary | ICD-10-CM

## 2019-03-08 DIAGNOSIS — I10 Essential (primary) hypertension: Secondary | ICD-10-CM | POA: Diagnosis not present

## 2019-03-08 DIAGNOSIS — F09 Unspecified mental disorder due to known physiological condition: Secondary | ICD-10-CM | POA: Diagnosis not present

## 2019-03-08 DIAGNOSIS — F0280 Dementia in other diseases classified elsewhere without behavioral disturbance: Secondary | ICD-10-CM | POA: Diagnosis not present

## 2019-03-08 DIAGNOSIS — G35 Multiple sclerosis: Secondary | ICD-10-CM | POA: Diagnosis not present

## 2019-03-08 DIAGNOSIS — L89154 Pressure ulcer of sacral region, stage 4: Secondary | ICD-10-CM | POA: Diagnosis not present

## 2019-03-08 DIAGNOSIS — L89892 Pressure ulcer of other site, stage 2: Secondary | ICD-10-CM | POA: Diagnosis not present

## 2019-03-08 DIAGNOSIS — F325 Major depressive disorder, single episode, in full remission: Secondary | ICD-10-CM | POA: Diagnosis not present

## 2019-03-08 DIAGNOSIS — F419 Anxiety disorder, unspecified: Secondary | ICD-10-CM | POA: Diagnosis not present

## 2019-03-08 DIAGNOSIS — G500 Trigeminal neuralgia: Secondary | ICD-10-CM | POA: Diagnosis not present

## 2019-03-08 NOTE — Progress Notes (Signed)
[facility] Consult Note Telephone: [phone]  Fax: [phone]  PATIENT NAME: TIAH HECKEL DOB: [birth year] MRN: [id number]  PRIMARY CARE PROVIDER:   Maurice Small, MD  REFERRING PROVIDER:  Maurice Small, MD [address] [place], [place] [address]  RESPONSIBLE PARTY:   Kaileen Bronkema, son [phone]     RECOMMENDATIONS and PLAN:  1.  MS. Patient has had MS for several years and is practically paralyzed in her lower extremities.She had right BKA in January this year. she now has her new prosthesis but HH RN wants to wait till her sacral wound heals more before restarting PT   2. Wound Care.Son has been following [facility] RN's advice with not sitting up as much and frequent repositioning.  States that the wound is getting better.  States that the [facility] RN comes in twice a week to change dressings. Patient has alternating pressure mattress and cushion for her wheelchair. Continue current wound management   3. Trigeminal neuralgia. Patient at times will have difficulty chewing secondary to her trigeminal neuralgia. Son states that her appetite is good and when her neuralgiais acting uphe feeds her soft foods and makes sure she drinks Ensure. Son states that her Lyrica has been decreased and her she will some jaw pain more often than before.  States he is able to give her tylenol or tramadol with relief.  States that with the decreased lyrica she is more alert and focused.  Continue current dose of lyrica and pain regimen.  I spent 30 minutes providing this consultation,  from 1:00 to 1:30. More than 50% of the time in this consultation was spent coordinating communication.   HISTORY OF PRESENT ILLNESS:  LATEEFA CROSBY is a 73 y.o. year old female with multiple medical problems including MS, trigeminal neuralgia right side, HTN. Palliative Care was asked to help address goals of care.   CODE STATUS: Full code   PPS: 40% HOSPICE ELIGIBILITY/DIAGNOSIS: TBD  PHYSICAL EXAM:  General:patient lying in bed in NAD Extremities: no edema, right BKA Skin: decubitus ulcer to sacrum.  Did not take off bandage for exam.  Son reports that it is getting better Neurological: patient has lower extremity weakness due to MS.   PAST MEDICAL HISTORY:  Past Medical History:  Diagnosis Date  . Abnormality of gait   . Depression   . DVT (deep venous thrombosis) (HCC)    Left leg  . Dyslipidemia   . Fibroids   . Hypertension   . Memory difficulties 12/10/2014  . Multiple sclerosis (HCC)    Right sided weakness and gait disorder, bladder and bowel problems   . Neurogenic bladder   . 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 Overdose of muscle relaxant    Unintentional baclofen overdose   . Trigeminal neuralgia   . Trigeminal neuralgia    Right  . Wound infection 10/12/2018    SOCIAL HX:  Social History   Tobacco Use  . Smoking status: Never Smoker  . Smokeless tobacco: Never Used  Substance Use Topics  . Alcohol use: No    ALLERGIES:  Allergies  Allergen Reactions  . Prednisone     Had gotten thrush  . Shellfish Allergy     Hives      PERTINENT MEDICATIONS:  Outpatient Encounter Medications as of 03/07/2019  Medication Sig  . acetaminophen (TYLENOL) 500 MG tablet Take 500 mg by mouth every 6 (six) hours as needed  for mild pain.   Marland Kitchen amLODipine (NORVASC) 10 MG tablet Take 1 tablet (10 mg total) by mouth daily.  . APPLE CIDER VINEGAR PO Take 1 capsule by mouth daily. 450mg   . atorvastatin (LIPITOR) 40 MG tablet Take 40 mg by mouth daily.    . B Complex-C (B-COMPLEX WITH VITAMIN C) tablet Take 1 tablet by mouth daily.  . baclofen (LIORESAL) 10 MG tablet ONE TABLET TWICE DURING THE DAY AND 2 AT NIGHT (Patient taking differently: Take 10 mg by mouth 4 (four) times daily. )  . buPROPion (WELLBUTRIN XL) 300 MG 24 hr tablet Take 300 mg by mouth daily.  . carbamazepine (TEGRETOL) 200 MG tablet TAKE 1  TABLET BY MOUTH 3 TIMES A DAY AND 1/2 TABLET AT BEDTIME AT BEDTIME (Patient taking differently: Take 200-300 mg by mouth 3 (three) times daily. 300 mg in the morning and 200 mg in the afternoon and bedtime.)  . Cholecalciferol (CVS VIT D 5000 HIGH-POTENCY PO) Take 5,000 Int'l Units/L by mouth daily.  Marland Kitchen CRANBERRY PO Take by mouth.  . Diaper Rash Products (DESITIN CLEAR EX) Apply topically.  . docusate sodium (COLACE) 100 MG capsule Take 1 capsule (100 mg total) by mouth 2 (two) times daily.  Marland Kitchen donepezil (ARICEPT) 5 MG tablet TAKE 1 TABLET (5 MG TOTAL) BY MOUTH DAILY.  Marland Kitchen FIBER SELECT GUMMIES PO Take 5 g by mouth 2 (two) times daily.   . fish oil-omega-3 fatty acids 1000 MG capsule Take 2 g by mouth 3 (three) times daily.  . hydrochlorothiazide (HYDRODIURIL) 25 MG tablet TAKE 1 TABLET BY MOUTH EVERY DAY IN THE MORNING  . Interferon Beta-1a (AVONEX PEN) 30 MCG/0.5ML AJKT Inject 30 mcg into the muscle every 7 (seven) days.  Marland Kitchen labetalol (NORMODYNE) 100 MG tablet Take 100 mg by mouth 2 (two) times daily.  . Multiple Vitamins-Minerals (MULTIVITAMINS THER. W/MINERALS) TABS Take 1 tablet by mouth daily.    . Potassium Chloride (KLOR-CON PO) Take 20 mEq by mouth 3 (three) times daily.   . pregabalin (LYRICA) 100 MG capsule Take 1 capsule (100 mg total) by mouth 2 (two) times daily.  . Probiotic Product (PROBIOTIC PO) Take by mouth.  . sertraline (ZOLOFT) 50 MG tablet Take 75 mg by mouth daily. One and one half tablet once a day in morning.   . Skin Protectants, Misc. (WHITE PETROLATUM-ZINC OXIDE) cream Apply topically as needed. (Patient taking differently: Apply 1 application topically daily as needed for dry skin or wound care. )  . tolterodine (DETROL LA) 2 MG 24 hr capsule TAKE 1 CAPSULE BY MOUTH EVERYDAY AT BEDTIME (Patient taking differently: Take 2 mg by mouth at bedtime. )  . traMADol (ULTRAM) 50 MG tablet Take 1 tablet (50 mg total) by mouth every 6 (six) hours as needed for moderate pain.  .  TURMERIC PO Take 450 mg by mouth.   No facility-administered encounter medications on file as of 03/07/2019.     PHYSICAL EXAM:   General: NAD, frail appearing, thin Cardiovascular: regular rate and rhythm Pulmonary: clear ant fields Abdomen: soft, nontender, + bowel sounds GU: no suprapubic tenderness Extremities: no edema, no joint deformities Skin: no rashes Neurological: Weakness but otherwise nonfocal  Susie Pousson Jenetta Downer, NP

## 2019-03-12 DIAGNOSIS — I10 Essential (primary) hypertension: Secondary | ICD-10-CM | POA: Diagnosis not present

## 2019-03-12 DIAGNOSIS — F09 Unspecified mental disorder due to known physiological condition: Secondary | ICD-10-CM | POA: Diagnosis not present

## 2019-03-12 DIAGNOSIS — F419 Anxiety disorder, unspecified: Secondary | ICD-10-CM | POA: Diagnosis not present

## 2019-03-12 DIAGNOSIS — G35 Multiple sclerosis: Secondary | ICD-10-CM | POA: Diagnosis not present

## 2019-03-12 DIAGNOSIS — L89892 Pressure ulcer of other site, stage 2: Secondary | ICD-10-CM | POA: Diagnosis not present

## 2019-03-12 DIAGNOSIS — L89154 Pressure ulcer of sacral region, stage 4: Secondary | ICD-10-CM | POA: Diagnosis not present

## 2019-03-12 DIAGNOSIS — G500 Trigeminal neuralgia: Secondary | ICD-10-CM | POA: Diagnosis not present

## 2019-03-12 DIAGNOSIS — F325 Major depressive disorder, single episode, in full remission: Secondary | ICD-10-CM | POA: Diagnosis not present

## 2019-03-12 DIAGNOSIS — F0280 Dementia in other diseases classified elsewhere without behavioral disturbance: Secondary | ICD-10-CM | POA: Diagnosis not present

## 2019-03-13 ENCOUNTER — Other Ambulatory Visit: Payer: Self-pay | Admitting: Neurology

## 2019-03-15 DIAGNOSIS — I10 Essential (primary) hypertension: Secondary | ICD-10-CM | POA: Diagnosis not present

## 2019-03-15 DIAGNOSIS — F0280 Dementia in other diseases classified elsewhere without behavioral disturbance: Secondary | ICD-10-CM | POA: Diagnosis not present

## 2019-03-15 DIAGNOSIS — G35 Multiple sclerosis: Secondary | ICD-10-CM | POA: Diagnosis not present

## 2019-03-15 DIAGNOSIS — L89154 Pressure ulcer of sacral region, stage 4: Secondary | ICD-10-CM | POA: Diagnosis not present

## 2019-03-15 DIAGNOSIS — L89892 Pressure ulcer of other site, stage 2: Secondary | ICD-10-CM | POA: Diagnosis not present

## 2019-03-15 DIAGNOSIS — G500 Trigeminal neuralgia: Secondary | ICD-10-CM | POA: Diagnosis not present

## 2019-03-15 DIAGNOSIS — F325 Major depressive disorder, single episode, in full remission: Secondary | ICD-10-CM | POA: Diagnosis not present

## 2019-03-15 DIAGNOSIS — F419 Anxiety disorder, unspecified: Secondary | ICD-10-CM | POA: Diagnosis not present

## 2019-03-15 DIAGNOSIS — F09 Unspecified mental disorder due to known physiological condition: Secondary | ICD-10-CM | POA: Diagnosis not present

## 2019-03-18 ENCOUNTER — Ambulatory Visit (INDEPENDENT_AMBULATORY_CARE_PROVIDER_SITE_OTHER): Payer: Medicare HMO | Admitting: Physician Assistant

## 2019-03-18 ENCOUNTER — Encounter: Payer: Self-pay | Admitting: Physician Assistant

## 2019-03-18 ENCOUNTER — Other Ambulatory Visit: Payer: Self-pay

## 2019-03-18 VITALS — Ht 63.0 in | Wt 190.0 lb

## 2019-03-18 DIAGNOSIS — Z89511 Acquired absence of right leg below knee: Secondary | ICD-10-CM | POA: Diagnosis not present

## 2019-03-18 NOTE — Progress Notes (Signed)
Office Visit Note   Patient: [name]           Date of Birth: April 09, 1946           MRN: [id number] Visit Date: 03/18/2019              Requested by: Maurice Small, MD [place], [place] [address] PCP: Maurice Small, MD  Chief Complaint  Patient presents with  . Right Leg - Routine Post Op    10/17/18 right BKA      HPI: Patient is a 73 year old woman who presents approximately 5 months status post right transtibial amputation.  She has been fit by [employer] for a prosthesis.  She currently has an aide assistance at home to help with ADLs and has a wound care nurse at home as well currently working with [place]: Visit Diagnoses:  1. Acquired absence of right lower extremity below knee [facility])     Plan: We will set her up with home health physical therapy for gait training with her prosthesis.  Once patient is safe to leave the home we could set her up with [facility] for outpatient therapy.  Follow-Up Instructions: Return if symptoms worsen or fail to improve.   Ortho Exam  Patient is alert, oriented, no adenopathy, well-dressed, normal affect, normal respiratory effort. Examination patient is ambulating in a wheelchair she has a good fitting prosthesis no signs of infection no open wounds.  Imaging: No results found. No images are attached to the encounter.  Labs: Lab Results  Component Value Date   ESRSEDRATE 93 (H) 10/11/2018   CRP 9.8 (H) 10/11/2018   REPTSTATUS 10/16/2018 FINAL 10/11/2018   CULT  10/11/2018    NO GROWTH 5 DAYS Performed at [facility], [place] [address] [place], [place] [address]    LABORGA ESCHERICHIA COLI 08/02/2009     Lab Results  Component Value Date   ALBUMIN 3.1 (L) 10/11/2018   ALBUMIN 4.2 07/09/2018   ALBUMIN 4.2 12/26/2017    Body mass index is 33.66 kg/m.  Orders:  No orders of the defined types were placed in this encounter.  No orders of the defined types were placed  in this encounter.    Procedures: No procedures performed  Clinical Data: No additional findings.  ROS:  All other systems negative, except as noted in the HPI. Review of Systems  Objective: Vital Signs: Ht 5\' 3"  (1.6 m)   Wt 190 lb 0.6 oz (86.2 kg)   BMI 33.66 kg/m   Specialty Comments:  No specialty comments available.  PMFS History: Patient Active Problem List   Diagnosis Date Noted  . Bedbound 10/13/2018  . Cellulitis of right lower extremity   . Wound infection 10/12/2018  . Subacute osteomyelitis, right ankle and foot ([place])   . Neurogenic bladder 06/16/2016  . Memory difficulties 12/10/2014  . Gait disorder 07/01/2013  . Tooth pain 11/02/2011  . Protein calorie malnutrition ([place]) 10/07/2011  . Constipation 09/23/2011  . Failure to thrive in childhood 09/22/2011  . Decubitus ulcer, buttock 09/22/2011  . Fibroid uterus 09/22/2011  . Abdominal pain, lower 09/22/2011  . Multiple sclerosis ([place])   . Hypertension   . Depression   . Organic brain syndrome   . Trigeminal neuralgia    Past Medical History:  Diagnosis Date  . Abnormality of gait   . Depression   . DVT (deep venous thrombosis) (HCC)    Left leg  . Dyslipidemia   .  Fibroids   . Hypertension   . Memory difficulties 12/10/2014  . Multiple sclerosis (HCC)    Right sided weakness and gait disorder, bladder and bowel problems   . Neurogenic bladder   . 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 Overdose of muscle relaxant    Unintentional baclofen overdose   . Trigeminal neuralgia   . Trigeminal neuralgia    Right  . Wound infection 10/12/2018    Family History  Problem Relation Age of Onset  . Multiple sclerosis Sister   . Cervical cancer Mother   . Liver disease Father   . Hyperlipidemia Brother   . Pancreatitis Brother   . Hypertension Other   . Heart disease Other   . Uterine cancer Other     Past Surgical History:  Procedure Laterality Date  . AMPUTATION Right 10/17/2018    Procedure: RIGHT BELOW KNEE AMPUTATION;  Surgeon: [name], MD;  Location: [facility];  Service: Orthopedics;  Laterality: Right;  . gamma knife procedure for rt. trigeminal neuralgia Right   . left leg DVT Left   . STRABISMUS SURGERY     Right eye    Social History   Occupational History  . Occupation: disabled Education officer, museum  Tobacco Use  . Smoking status: Never Smoker  . Smokeless tobacco: Never Used  Substance and Sexual Activity  . Alcohol use: No  . Drug use: No  . Sexual activity: Not Currently    Birth control/protection: Post-menopausal

## 2019-03-19 DIAGNOSIS — G35 Multiple sclerosis: Secondary | ICD-10-CM | POA: Diagnosis not present

## 2019-03-19 DIAGNOSIS — L89154 Pressure ulcer of sacral region, stage 4: Secondary | ICD-10-CM | POA: Diagnosis not present

## 2019-03-19 DIAGNOSIS — G500 Trigeminal neuralgia: Secondary | ICD-10-CM | POA: Diagnosis not present

## 2019-03-19 DIAGNOSIS — F419 Anxiety disorder, unspecified: Secondary | ICD-10-CM | POA: Diagnosis not present

## 2019-03-19 DIAGNOSIS — F09 Unspecified mental disorder due to known physiological condition: Secondary | ICD-10-CM | POA: Diagnosis not present

## 2019-03-19 DIAGNOSIS — F0280 Dementia in other diseases classified elsewhere without behavioral disturbance: Secondary | ICD-10-CM | POA: Diagnosis not present

## 2019-03-19 DIAGNOSIS — F325 Major depressive disorder, single episode, in full remission: Secondary | ICD-10-CM | POA: Diagnosis not present

## 2019-03-19 DIAGNOSIS — I10 Essential (primary) hypertension: Secondary | ICD-10-CM | POA: Diagnosis not present

## 2019-03-19 DIAGNOSIS — L89892 Pressure ulcer of other site, stage 2: Secondary | ICD-10-CM | POA: Diagnosis not present

## 2019-03-21 DIAGNOSIS — G35 Multiple sclerosis: Secondary | ICD-10-CM | POA: Diagnosis not present

## 2019-03-21 DIAGNOSIS — F325 Major depressive disorder, single episode, in full remission: Secondary | ICD-10-CM | POA: Diagnosis not present

## 2019-03-21 DIAGNOSIS — F0280 Dementia in other diseases classified elsewhere without behavioral disturbance: Secondary | ICD-10-CM | POA: Diagnosis not present

## 2019-03-21 DIAGNOSIS — F09 Unspecified mental disorder due to known physiological condition: Secondary | ICD-10-CM | POA: Diagnosis not present

## 2019-03-21 DIAGNOSIS — G500 Trigeminal neuralgia: Secondary | ICD-10-CM | POA: Diagnosis not present

## 2019-03-21 DIAGNOSIS — L89154 Pressure ulcer of sacral region, stage 4: Secondary | ICD-10-CM | POA: Diagnosis not present

## 2019-03-21 DIAGNOSIS — I10 Essential (primary) hypertension: Secondary | ICD-10-CM | POA: Diagnosis not present

## 2019-03-21 DIAGNOSIS — L89892 Pressure ulcer of other site, stage 2: Secondary | ICD-10-CM | POA: Diagnosis not present

## 2019-03-21 DIAGNOSIS — F419 Anxiety disorder, unspecified: Secondary | ICD-10-CM | POA: Diagnosis not present

## 2019-03-26 DIAGNOSIS — F325 Major depressive disorder, single episode, in full remission: Secondary | ICD-10-CM | POA: Diagnosis not present

## 2019-03-26 DIAGNOSIS — I10 Essential (primary) hypertension: Secondary | ICD-10-CM | POA: Diagnosis not present

## 2019-03-26 DIAGNOSIS — G500 Trigeminal neuralgia: Secondary | ICD-10-CM | POA: Diagnosis not present

## 2019-03-26 DIAGNOSIS — L89154 Pressure ulcer of sacral region, stage 4: Secondary | ICD-10-CM | POA: Diagnosis not present

## 2019-03-26 DIAGNOSIS — G35 Multiple sclerosis: Secondary | ICD-10-CM | POA: Diagnosis not present

## 2019-03-26 DIAGNOSIS — F09 Unspecified mental disorder due to known physiological condition: Secondary | ICD-10-CM | POA: Diagnosis not present

## 2019-03-26 DIAGNOSIS — L89892 Pressure ulcer of other site, stage 2: Secondary | ICD-10-CM | POA: Diagnosis not present

## 2019-03-26 DIAGNOSIS — F0280 Dementia in other diseases classified elsewhere without behavioral disturbance: Secondary | ICD-10-CM | POA: Diagnosis not present

## 2019-03-26 DIAGNOSIS — F419 Anxiety disorder, unspecified: Secondary | ICD-10-CM | POA: Diagnosis not present

## 2019-03-28 DIAGNOSIS — I10 Essential (primary) hypertension: Secondary | ICD-10-CM | POA: Diagnosis not present

## 2019-03-28 DIAGNOSIS — L89154 Pressure ulcer of sacral region, stage 4: Secondary | ICD-10-CM | POA: Diagnosis not present

## 2019-03-28 DIAGNOSIS — F419 Anxiety disorder, unspecified: Secondary | ICD-10-CM | POA: Diagnosis not present

## 2019-03-28 DIAGNOSIS — F325 Major depressive disorder, single episode, in full remission: Secondary | ICD-10-CM | POA: Diagnosis not present

## 2019-03-28 DIAGNOSIS — L89892 Pressure ulcer of other site, stage 2: Secondary | ICD-10-CM | POA: Diagnosis not present

## 2019-03-28 DIAGNOSIS — G500 Trigeminal neuralgia: Secondary | ICD-10-CM | POA: Diagnosis not present

## 2019-03-28 DIAGNOSIS — F09 Unspecified mental disorder due to known physiological condition: Secondary | ICD-10-CM | POA: Diagnosis not present

## 2019-03-28 DIAGNOSIS — F0280 Dementia in other diseases classified elsewhere without behavioral disturbance: Secondary | ICD-10-CM | POA: Diagnosis not present

## 2019-03-28 DIAGNOSIS — G35 Multiple sclerosis: Secondary | ICD-10-CM | POA: Diagnosis not present

## 2019-04-01 ENCOUNTER — Other Ambulatory Visit: Payer: Self-pay | Admitting: Neurology

## 2019-04-02 DIAGNOSIS — I10 Essential (primary) hypertension: Secondary | ICD-10-CM | POA: Diagnosis not present

## 2019-04-02 DIAGNOSIS — F419 Anxiety disorder, unspecified: Secondary | ICD-10-CM | POA: Diagnosis not present

## 2019-04-02 DIAGNOSIS — L89892 Pressure ulcer of other site, stage 2: Secondary | ICD-10-CM | POA: Diagnosis not present

## 2019-04-02 DIAGNOSIS — F09 Unspecified mental disorder due to known physiological condition: Secondary | ICD-10-CM | POA: Diagnosis not present

## 2019-04-02 DIAGNOSIS — F0280 Dementia in other diseases classified elsewhere without behavioral disturbance: Secondary | ICD-10-CM | POA: Diagnosis not present

## 2019-04-02 DIAGNOSIS — F325 Major depressive disorder, single episode, in full remission: Secondary | ICD-10-CM | POA: Diagnosis not present

## 2019-04-02 DIAGNOSIS — G500 Trigeminal neuralgia: Secondary | ICD-10-CM | POA: Diagnosis not present

## 2019-04-02 DIAGNOSIS — L89154 Pressure ulcer of sacral region, stage 4: Secondary | ICD-10-CM | POA: Diagnosis not present

## 2019-04-02 DIAGNOSIS — G35 Multiple sclerosis: Secondary | ICD-10-CM | POA: Diagnosis not present

## 2019-04-03 DIAGNOSIS — L89309 Pressure ulcer of unspecified buttock, unspecified stage: Secondary | ICD-10-CM | POA: Diagnosis not present

## 2019-04-03 DIAGNOSIS — R32 Unspecified urinary incontinence: Secondary | ICD-10-CM | POA: Diagnosis not present

## 2019-04-03 DIAGNOSIS — L8992 Pressure ulcer of unspecified site, stage 2: Secondary | ICD-10-CM | POA: Diagnosis not present

## 2019-04-03 DIAGNOSIS — G35 Multiple sclerosis: Secondary | ICD-10-CM | POA: Diagnosis not present

## 2019-04-04 DIAGNOSIS — F325 Major depressive disorder, single episode, in full remission: Secondary | ICD-10-CM | POA: Diagnosis not present

## 2019-04-04 DIAGNOSIS — G35 Multiple sclerosis: Secondary | ICD-10-CM | POA: Diagnosis not present

## 2019-04-04 DIAGNOSIS — F0280 Dementia in other diseases classified elsewhere without behavioral disturbance: Secondary | ICD-10-CM | POA: Diagnosis not present

## 2019-04-04 DIAGNOSIS — G500 Trigeminal neuralgia: Secondary | ICD-10-CM | POA: Diagnosis not present

## 2019-04-04 DIAGNOSIS — F09 Unspecified mental disorder due to known physiological condition: Secondary | ICD-10-CM | POA: Diagnosis not present

## 2019-04-04 DIAGNOSIS — L89892 Pressure ulcer of other site, stage 2: Secondary | ICD-10-CM | POA: Diagnosis not present

## 2019-04-04 DIAGNOSIS — L89154 Pressure ulcer of sacral region, stage 4: Secondary | ICD-10-CM | POA: Diagnosis not present

## 2019-04-04 DIAGNOSIS — F419 Anxiety disorder, unspecified: Secondary | ICD-10-CM | POA: Diagnosis not present

## 2019-04-04 DIAGNOSIS — I10 Essential (primary) hypertension: Secondary | ICD-10-CM | POA: Diagnosis not present

## 2019-04-09 ENCOUNTER — Telehealth: Payer: Self-pay | Admitting: Neurology

## 2019-04-09 DIAGNOSIS — F09 Unspecified mental disorder due to known physiological condition: Secondary | ICD-10-CM | POA: Diagnosis not present

## 2019-04-09 DIAGNOSIS — L89154 Pressure ulcer of sacral region, stage 4: Secondary | ICD-10-CM | POA: Diagnosis not present

## 2019-04-09 DIAGNOSIS — G500 Trigeminal neuralgia: Secondary | ICD-10-CM | POA: Diagnosis not present

## 2019-04-09 DIAGNOSIS — L89892 Pressure ulcer of other site, stage 2: Secondary | ICD-10-CM | POA: Diagnosis not present

## 2019-04-09 DIAGNOSIS — F325 Major depressive disorder, single episode, in full remission: Secondary | ICD-10-CM | POA: Diagnosis not present

## 2019-04-09 DIAGNOSIS — G35 Multiple sclerosis: Secondary | ICD-10-CM | POA: Diagnosis not present

## 2019-04-09 DIAGNOSIS — F0280 Dementia in other diseases classified elsewhere without behavioral disturbance: Secondary | ICD-10-CM | POA: Diagnosis not present

## 2019-04-09 DIAGNOSIS — F419 Anxiety disorder, unspecified: Secondary | ICD-10-CM | POA: Diagnosis not present

## 2019-04-09 DIAGNOSIS — I10 Essential (primary) hypertension: Secondary | ICD-10-CM | POA: Diagnosis not present

## 2019-04-09 MED ORDER — TRAMADOL HCL 50 MG PO TABS: 50.0000 mg | ORAL_TABLET | Freq: Four times a day (QID) | ORAL | 0 refills | Status: DC | PRN

## 2019-04-09 NOTE — Telephone Encounter (Signed)
The prescription for Ultram will be refilled.

## 2019-04-09 NOTE — Telephone Encounter (Signed)
Pt is needing a refill on her traMADol (ULTRAM) 50 MG [facility]

## 2019-04-11 ENCOUNTER — Other Ambulatory Visit: Payer: Self-pay | Admitting: Neurology

## 2019-04-11 DIAGNOSIS — L89154 Pressure ulcer of sacral region, stage 4: Secondary | ICD-10-CM | POA: Diagnosis not present

## 2019-04-11 DIAGNOSIS — F0280 Dementia in other diseases classified elsewhere without behavioral disturbance: Secondary | ICD-10-CM | POA: Diagnosis not present

## 2019-04-11 DIAGNOSIS — F325 Major depressive disorder, single episode, in full remission: Secondary | ICD-10-CM | POA: Diagnosis not present

## 2019-04-11 DIAGNOSIS — G500 Trigeminal neuralgia: Secondary | ICD-10-CM | POA: Diagnosis not present

## 2019-04-11 DIAGNOSIS — F419 Anxiety disorder, unspecified: Secondary | ICD-10-CM | POA: Diagnosis not present

## 2019-04-11 DIAGNOSIS — L89892 Pressure ulcer of other site, stage 2: Secondary | ICD-10-CM | POA: Diagnosis not present

## 2019-04-11 DIAGNOSIS — G35 Multiple sclerosis: Secondary | ICD-10-CM | POA: Diagnosis not present

## 2019-04-11 DIAGNOSIS — I10 Essential (primary) hypertension: Secondary | ICD-10-CM | POA: Diagnosis not present

## 2019-04-11 DIAGNOSIS — F09 Unspecified mental disorder due to known physiological condition: Secondary | ICD-10-CM | POA: Diagnosis not present

## 2019-04-15 DIAGNOSIS — F09 Unspecified mental disorder due to known physiological condition: Secondary | ICD-10-CM | POA: Diagnosis not present

## 2019-04-15 DIAGNOSIS — F419 Anxiety disorder, unspecified: Secondary | ICD-10-CM | POA: Diagnosis not present

## 2019-04-15 DIAGNOSIS — F325 Major depressive disorder, single episode, in full remission: Secondary | ICD-10-CM | POA: Diagnosis not present

## 2019-04-15 DIAGNOSIS — G500 Trigeminal neuralgia: Secondary | ICD-10-CM | POA: Diagnosis not present

## 2019-04-15 DIAGNOSIS — F0280 Dementia in other diseases classified elsewhere without behavioral disturbance: Secondary | ICD-10-CM | POA: Diagnosis not present

## 2019-04-15 DIAGNOSIS — I10 Essential (primary) hypertension: Secondary | ICD-10-CM | POA: Diagnosis not present

## 2019-04-15 DIAGNOSIS — L89892 Pressure ulcer of other site, stage 2: Secondary | ICD-10-CM | POA: Diagnosis not present

## 2019-04-15 DIAGNOSIS — G35 Multiple sclerosis: Secondary | ICD-10-CM | POA: Diagnosis not present

## 2019-04-15 DIAGNOSIS — L89154 Pressure ulcer of sacral region, stage 4: Secondary | ICD-10-CM | POA: Diagnosis not present

## 2019-04-18 DIAGNOSIS — F09 Unspecified mental disorder due to known physiological condition: Secondary | ICD-10-CM | POA: Diagnosis not present

## 2019-04-18 DIAGNOSIS — G35 Multiple sclerosis: Secondary | ICD-10-CM | POA: Diagnosis not present

## 2019-04-18 DIAGNOSIS — F0280 Dementia in other diseases classified elsewhere without behavioral disturbance: Secondary | ICD-10-CM | POA: Diagnosis not present

## 2019-04-18 DIAGNOSIS — F325 Major depressive disorder, single episode, in full remission: Secondary | ICD-10-CM | POA: Diagnosis not present

## 2019-04-18 DIAGNOSIS — L89154 Pressure ulcer of sacral region, stage 4: Secondary | ICD-10-CM | POA: Diagnosis not present

## 2019-04-18 DIAGNOSIS — F419 Anxiety disorder, unspecified: Secondary | ICD-10-CM | POA: Diagnosis not present

## 2019-04-18 DIAGNOSIS — I10 Essential (primary) hypertension: Secondary | ICD-10-CM | POA: Diagnosis not present

## 2019-04-18 DIAGNOSIS — L89892 Pressure ulcer of other site, stage 2: Secondary | ICD-10-CM | POA: Diagnosis not present

## 2019-04-18 DIAGNOSIS — G500 Trigeminal neuralgia: Secondary | ICD-10-CM | POA: Diagnosis not present

## 2019-04-23 DIAGNOSIS — G500 Trigeminal neuralgia: Secondary | ICD-10-CM | POA: Diagnosis not present

## 2019-04-23 DIAGNOSIS — L89154 Pressure ulcer of sacral region, stage 4: Secondary | ICD-10-CM | POA: Diagnosis not present

## 2019-04-23 DIAGNOSIS — G35 Multiple sclerosis: Secondary | ICD-10-CM | POA: Diagnosis not present

## 2019-04-23 DIAGNOSIS — F325 Major depressive disorder, single episode, in full remission: Secondary | ICD-10-CM | POA: Diagnosis not present

## 2019-04-23 DIAGNOSIS — I10 Essential (primary) hypertension: Secondary | ICD-10-CM | POA: Diagnosis not present

## 2019-04-23 DIAGNOSIS — F0280 Dementia in other diseases classified elsewhere without behavioral disturbance: Secondary | ICD-10-CM | POA: Diagnosis not present

## 2019-04-23 DIAGNOSIS — F419 Anxiety disorder, unspecified: Secondary | ICD-10-CM | POA: Diagnosis not present

## 2019-04-23 DIAGNOSIS — L89892 Pressure ulcer of other site, stage 2: Secondary | ICD-10-CM | POA: Diagnosis not present

## 2019-04-23 DIAGNOSIS — F09 Unspecified mental disorder due to known physiological condition: Secondary | ICD-10-CM | POA: Diagnosis not present

## 2019-04-26 DIAGNOSIS — G35 Multiple sclerosis: Secondary | ICD-10-CM | POA: Diagnosis not present

## 2019-04-26 DIAGNOSIS — F0280 Dementia in other diseases classified elsewhere without behavioral disturbance: Secondary | ICD-10-CM | POA: Diagnosis not present

## 2019-04-26 DIAGNOSIS — G500 Trigeminal neuralgia: Secondary | ICD-10-CM | POA: Diagnosis not present

## 2019-04-26 DIAGNOSIS — I10 Essential (primary) hypertension: Secondary | ICD-10-CM | POA: Diagnosis not present

## 2019-04-26 DIAGNOSIS — F325 Major depressive disorder, single episode, in full remission: Secondary | ICD-10-CM | POA: Diagnosis not present

## 2019-04-26 DIAGNOSIS — L89154 Pressure ulcer of sacral region, stage 4: Secondary | ICD-10-CM | POA: Diagnosis not present

## 2019-04-26 DIAGNOSIS — F419 Anxiety disorder, unspecified: Secondary | ICD-10-CM | POA: Diagnosis not present

## 2019-04-26 DIAGNOSIS — F09 Unspecified mental disorder due to known physiological condition: Secondary | ICD-10-CM | POA: Diagnosis not present

## 2019-04-26 DIAGNOSIS — L89892 Pressure ulcer of other site, stage 2: Secondary | ICD-10-CM | POA: Diagnosis not present

## 2019-04-30 DIAGNOSIS — F419 Anxiety disorder, unspecified: Secondary | ICD-10-CM | POA: Diagnosis not present

## 2019-04-30 DIAGNOSIS — G35 Multiple sclerosis: Secondary | ICD-10-CM | POA: Diagnosis not present

## 2019-04-30 DIAGNOSIS — G500 Trigeminal neuralgia: Secondary | ICD-10-CM | POA: Diagnosis not present

## 2019-04-30 DIAGNOSIS — L89892 Pressure ulcer of other site, stage 2: Secondary | ICD-10-CM | POA: Diagnosis not present

## 2019-04-30 DIAGNOSIS — F325 Major depressive disorder, single episode, in full remission: Secondary | ICD-10-CM | POA: Diagnosis not present

## 2019-04-30 DIAGNOSIS — L89154 Pressure ulcer of sacral region, stage 4: Secondary | ICD-10-CM | POA: Diagnosis not present

## 2019-04-30 DIAGNOSIS — F09 Unspecified mental disorder due to known physiological condition: Secondary | ICD-10-CM | POA: Diagnosis not present

## 2019-04-30 DIAGNOSIS — F0280 Dementia in other diseases classified elsewhere without behavioral disturbance: Secondary | ICD-10-CM | POA: Diagnosis not present

## 2019-04-30 DIAGNOSIS — I10 Essential (primary) hypertension: Secondary | ICD-10-CM | POA: Diagnosis not present

## 2019-05-03 DIAGNOSIS — L89154 Pressure ulcer of sacral region, stage 4: Secondary | ICD-10-CM | POA: Diagnosis not present

## 2019-05-03 DIAGNOSIS — I10 Essential (primary) hypertension: Secondary | ICD-10-CM | POA: Diagnosis not present

## 2019-05-03 DIAGNOSIS — R32 Unspecified urinary incontinence: Secondary | ICD-10-CM | POA: Diagnosis not present

## 2019-05-03 DIAGNOSIS — F419 Anxiety disorder, unspecified: Secondary | ICD-10-CM | POA: Diagnosis not present

## 2019-05-03 DIAGNOSIS — L89892 Pressure ulcer of other site, stage 2: Secondary | ICD-10-CM | POA: Diagnosis not present

## 2019-05-03 DIAGNOSIS — L89309 Pressure ulcer of unspecified buttock, unspecified stage: Secondary | ICD-10-CM | POA: Diagnosis not present

## 2019-05-03 DIAGNOSIS — L8992 Pressure ulcer of unspecified site, stage 2: Secondary | ICD-10-CM | POA: Diagnosis not present

## 2019-05-03 DIAGNOSIS — F0280 Dementia in other diseases classified elsewhere without behavioral disturbance: Secondary | ICD-10-CM | POA: Diagnosis not present

## 2019-05-03 DIAGNOSIS — F325 Major depressive disorder, single episode, in full remission: Secondary | ICD-10-CM | POA: Diagnosis not present

## 2019-05-03 DIAGNOSIS — G35 Multiple sclerosis: Secondary | ICD-10-CM | POA: Diagnosis not present

## 2019-05-03 DIAGNOSIS — G500 Trigeminal neuralgia: Secondary | ICD-10-CM | POA: Diagnosis not present

## 2019-05-03 DIAGNOSIS — F09 Unspecified mental disorder due to known physiological condition: Secondary | ICD-10-CM | POA: Diagnosis not present

## 2019-05-07 DIAGNOSIS — L89892 Pressure ulcer of other site, stage 2: Secondary | ICD-10-CM | POA: Diagnosis not present

## 2019-05-07 DIAGNOSIS — G500 Trigeminal neuralgia: Secondary | ICD-10-CM | POA: Diagnosis not present

## 2019-05-07 DIAGNOSIS — F09 Unspecified mental disorder due to known physiological condition: Secondary | ICD-10-CM | POA: Diagnosis not present

## 2019-05-07 DIAGNOSIS — F419 Anxiety disorder, unspecified: Secondary | ICD-10-CM | POA: Diagnosis not present

## 2019-05-07 DIAGNOSIS — F0280 Dementia in other diseases classified elsewhere without behavioral disturbance: Secondary | ICD-10-CM | POA: Diagnosis not present

## 2019-05-07 DIAGNOSIS — I10 Essential (primary) hypertension: Secondary | ICD-10-CM | POA: Diagnosis not present

## 2019-05-07 DIAGNOSIS — F325 Major depressive disorder, single episode, in full remission: Secondary | ICD-10-CM | POA: Diagnosis not present

## 2019-05-07 DIAGNOSIS — L89154 Pressure ulcer of sacral region, stage 4: Secondary | ICD-10-CM | POA: Diagnosis not present

## 2019-05-07 DIAGNOSIS — G35 Multiple sclerosis: Secondary | ICD-10-CM | POA: Diagnosis not present

## 2019-05-10 DIAGNOSIS — I10 Essential (primary) hypertension: Secondary | ICD-10-CM | POA: Diagnosis not present

## 2019-05-10 DIAGNOSIS — L89154 Pressure ulcer of sacral region, stage 4: Secondary | ICD-10-CM | POA: Diagnosis not present

## 2019-05-10 DIAGNOSIS — F325 Major depressive disorder, single episode, in full remission: Secondary | ICD-10-CM | POA: Diagnosis not present

## 2019-05-10 DIAGNOSIS — G500 Trigeminal neuralgia: Secondary | ICD-10-CM | POA: Diagnosis not present

## 2019-05-10 DIAGNOSIS — G35 Multiple sclerosis: Secondary | ICD-10-CM | POA: Diagnosis not present

## 2019-05-10 DIAGNOSIS — F419 Anxiety disorder, unspecified: Secondary | ICD-10-CM | POA: Diagnosis not present

## 2019-05-10 DIAGNOSIS — L89892 Pressure ulcer of other site, stage 2: Secondary | ICD-10-CM | POA: Diagnosis not present

## 2019-05-10 DIAGNOSIS — F0280 Dementia in other diseases classified elsewhere without behavioral disturbance: Secondary | ICD-10-CM | POA: Diagnosis not present

## 2019-05-10 DIAGNOSIS — F09 Unspecified mental disorder due to known physiological condition: Secondary | ICD-10-CM | POA: Diagnosis not present

## 2019-05-14 DIAGNOSIS — F0280 Dementia in other diseases classified elsewhere without behavioral disturbance: Secondary | ICD-10-CM | POA: Diagnosis not present

## 2019-05-14 DIAGNOSIS — L89154 Pressure ulcer of sacral region, stage 4: Secondary | ICD-10-CM | POA: Diagnosis not present

## 2019-05-14 DIAGNOSIS — L89892 Pressure ulcer of other site, stage 2: Secondary | ICD-10-CM | POA: Diagnosis not present

## 2019-05-14 DIAGNOSIS — G500 Trigeminal neuralgia: Secondary | ICD-10-CM | POA: Diagnosis not present

## 2019-05-14 DIAGNOSIS — I10 Essential (primary) hypertension: Secondary | ICD-10-CM | POA: Diagnosis not present

## 2019-05-14 DIAGNOSIS — F419 Anxiety disorder, unspecified: Secondary | ICD-10-CM | POA: Diagnosis not present

## 2019-05-14 DIAGNOSIS — F325 Major depressive disorder, single episode, in full remission: Secondary | ICD-10-CM | POA: Diagnosis not present

## 2019-05-14 DIAGNOSIS — F09 Unspecified mental disorder due to known physiological condition: Secondary | ICD-10-CM | POA: Diagnosis not present

## 2019-05-14 DIAGNOSIS — G35 Multiple sclerosis: Secondary | ICD-10-CM | POA: Diagnosis not present

## 2019-05-21 DIAGNOSIS — F325 Major depressive disorder, single episode, in full remission: Secondary | ICD-10-CM | POA: Diagnosis not present

## 2019-05-21 DIAGNOSIS — L89154 Pressure ulcer of sacral region, stage 4: Secondary | ICD-10-CM | POA: Diagnosis not present

## 2019-05-21 DIAGNOSIS — F0280 Dementia in other diseases classified elsewhere without behavioral disturbance: Secondary | ICD-10-CM | POA: Diagnosis not present

## 2019-05-21 DIAGNOSIS — I10 Essential (primary) hypertension: Secondary | ICD-10-CM | POA: Diagnosis not present

## 2019-05-21 DIAGNOSIS — F419 Anxiety disorder, unspecified: Secondary | ICD-10-CM | POA: Diagnosis not present

## 2019-05-21 DIAGNOSIS — F09 Unspecified mental disorder due to known physiological condition: Secondary | ICD-10-CM | POA: Diagnosis not present

## 2019-05-21 DIAGNOSIS — G500 Trigeminal neuralgia: Secondary | ICD-10-CM | POA: Diagnosis not present

## 2019-05-21 DIAGNOSIS — G35 Multiple sclerosis: Secondary | ICD-10-CM | POA: Diagnosis not present

## 2019-05-21 DIAGNOSIS — L89892 Pressure ulcer of other site, stage 2: Secondary | ICD-10-CM | POA: Diagnosis not present

## 2019-05-28 DIAGNOSIS — F419 Anxiety disorder, unspecified: Secondary | ICD-10-CM | POA: Diagnosis not present

## 2019-05-28 DIAGNOSIS — L89154 Pressure ulcer of sacral region, stage 4: Secondary | ICD-10-CM | POA: Diagnosis not present

## 2019-05-28 DIAGNOSIS — G35 Multiple sclerosis: Secondary | ICD-10-CM | POA: Diagnosis not present

## 2019-05-28 DIAGNOSIS — G500 Trigeminal neuralgia: Secondary | ICD-10-CM | POA: Diagnosis not present

## 2019-05-28 DIAGNOSIS — I10 Essential (primary) hypertension: Secondary | ICD-10-CM | POA: Diagnosis not present

## 2019-05-28 DIAGNOSIS — F325 Major depressive disorder, single episode, in full remission: Secondary | ICD-10-CM | POA: Diagnosis not present

## 2019-05-28 DIAGNOSIS — F09 Unspecified mental disorder due to known physiological condition: Secondary | ICD-10-CM | POA: Diagnosis not present

## 2019-05-28 DIAGNOSIS — F0280 Dementia in other diseases classified elsewhere without behavioral disturbance: Secondary | ICD-10-CM | POA: Diagnosis not present

## 2019-05-28 DIAGNOSIS — L89892 Pressure ulcer of other site, stage 2: Secondary | ICD-10-CM | POA: Diagnosis not present

## 2019-06-03 DIAGNOSIS — R32 Unspecified urinary incontinence: Secondary | ICD-10-CM | POA: Diagnosis not present

## 2019-06-03 DIAGNOSIS — G35 Multiple sclerosis: Secondary | ICD-10-CM | POA: Diagnosis not present

## 2019-06-03 DIAGNOSIS — L89309 Pressure ulcer of unspecified buttock, unspecified stage: Secondary | ICD-10-CM | POA: Diagnosis not present

## 2019-06-03 DIAGNOSIS — L8992 Pressure ulcer of unspecified site, stage 2: Secondary | ICD-10-CM | POA: Diagnosis not present

## 2019-06-03 NOTE — Progress Notes (Signed)
PATIENT: [name] DOB: May 30, 1946  REASON FOR VISIT: follow up HISTORY FROM: patient  HISTORY OF PRESENT ILLNESS: Today 06/04/19  Ms. Bechtel is a 73 year old female with history of multiple sclerosis on Avonex.  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  She has history of trigeminal neuralgia that may flare on occasion.  She is treated with Lyrica and carbamazepine.  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  She has a prosthetic limb.  At last visit her dose of Lyrica was reduced to 100 mg twice a day due to the report of her being "zoned out". She is more alert with the dose reduction. She had a trigeminal neuralgia flare on the right side last week. She got a refill on Tramadol to get through the flare. It has since calmed down. She lives with her son. Her memory is stable. She may forget that she doesn't live in [place] any more. She has a home health aid that comes M-F for 2 hours. She needs assistance with ADLs. She has a sacral ulcer that is healing. She wears adult briefs and is incontinent of urine. She also has a nurse coming weekly to check her ulcer. She was never set up for physical therapy for the BKA due to [place]. Is not able to weight bear at this point. Her appetite is fair. She does seem to drool more often.  She presents today for follow-up in a wheelchair, accompanied by her son.  HISTORY 01/31/2019 Dr. Jannifer Franklin: [name] is a 73 year old right-handed black female with a history of multiple sclerosis on Avonex.  She has had a an ongoing progressive process with her multiple sclerosis, she has developed a severe gait disorder and a dementia.  She lives at home with her son, her son is her caretaker, she does have some assistance coming in 10 hours a week as well.  She has a history of trigeminal neuralgia that may flare on occasion, more recently this has  been doing well.  She is treated with Lyrica and carbamazepine for this.  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  She has not been able to regain her ability to stand up, she just recently got a prosthetic limb.  She is not eating well, she has become more zoned out, she may drool more.  The patient is overall more confused.  She will take in fluids fairly well, but she only eats about 1 good meal a day and may get Ensure for breakfast and lunch.  She has not had any falls.   REVIEW OF SYSTEMS: Out of a complete 14 system review of symptoms, the patient complains only of the following symptoms, and all other reviewed systems are negative.  Memory loss, headache, weakness, and drooling  ALLERGIES: Allergies  Allergen Reactions  . Prednisone     Had gotten thrush  . Shellfish Allergy     Hives     HOME MEDICATIONS: Outpatient Medications Prior to Visit  Medication Sig Dispense Refill  . acetaminophen (TYLENOL) 500 MG tablet Take 500 mg by mouth every 6 (six) hours as needed for mild pain.     Marland Kitchen amLODipine (NORVASC) 10 MG tablet Take 1 tablet (10 mg total) by mouth daily. 30 tablet 3  . APPLE CIDER VINEGAR PO Take 1 capsule by mouth daily. 450mg     . atorvastatin (  LIPITOR) 40 MG tablet Take 40 mg by mouth daily.      . B Complex-C (B-COMPLEX WITH VITAMIN C) tablet Take 1 tablet by mouth daily.    . baclofen (LIORESAL) 10 MG tablet ONE TABLET TWICE DURING THE DAY AND 2 AT NIGHT (Patient taking differently: Take 10 mg by mouth 4 (four) times daily. ) 360 tablet 3  . buPROPion (WELLBUTRIN XL) 300 MG 24 hr tablet Take 300 mg by mouth daily.    . carbamazepine (TEGRETOL) 200 MG tablet TAKE 1 TABLET BY MOUTH 3 TIMES A DAY AND 1/2 TABLET AT BEDTIME AT BEDTIME 315 tablet 1  . Cholecalciferol ([facility] VIT D 5000 HIGH-POTENCY PO) Take 5,000 Int'l Units/L by mouth daily.    Marland Kitchen CRANBERRY PO Take by mouth.    . Diaper Rash Products (DESITIN CLEAR EX) Apply topically.    . docusate  sodium (COLACE) 100 MG capsule Take 1 capsule (100 mg total) by mouth 2 (two) times daily. 10 capsule 0  . donepezil (ARICEPT) 5 MG tablet TAKE 1 TABLET BY MOUTH EVERY DAY 90 tablet 4  . FIBER SELECT GUMMIES PO Take 5 g by mouth 2 (two) times daily.     . fish oil-omega-3 fatty acids 1000 MG capsule Take 2 g by mouth 3 (three) times daily.    . hydrALAZINE (APRESOLINE) 10 MG tablet     . hydrochlorothiazide (HYDRODIURIL) 25 MG tablet TAKE 1 TABLET BY MOUTH EVERY DAY IN THE MORNING    . Interferon Beta-1a (AVONEX PEN) 30 MCG/0.5ML AJKT Inject 30 mcg into the muscle every 7 (seven) days. 12 each 3  . labetalol (NORMODYNE) 100 MG tablet Take 100 mg by mouth 2 (two) times daily.    . Multiple Vitamins-Minerals (MULTIVITAMINS THER. W/MINERALS) TABS Take 1 tablet by mouth daily.      . Potassium Chloride (KLOR-CON PO) Take 20 mEq by mouth 3 (three) times daily.     . pregabalin (LYRICA) 100 MG capsule Take 1 capsule (100 mg total) by mouth 2 (two) times daily. 60 capsule 5  . Probiotic Product (PROBIOTIC PO) Take by mouth.    . sertraline (ZOLOFT) 50 MG tablet Take 75 mg by mouth daily. One and one half tablet once a day in morning.     . Skin Protectants, Misc. (WHITE PETROLATUM-ZINC OXIDE) cream Apply topically as needed. (Patient taking differently: Apply 1 application topically daily as needed for dry skin or wound care. ) 98 g 0  . tolterodine (DETROL LA) 2 MG 24 hr capsule Take 1 capsule (2 mg total) by mouth at bedtime. 90 capsule 1  . traMADol (ULTRAM) 50 MG tablet Take 1 tablet (50 mg total) by mouth every 6 (six) hours as needed for moderate pain. 30 tablet 0  . TURMERIC PO Take 450 mg by mouth.     No facility-administered medications prior to visit.     PAST MEDICAL HISTORY: Past Medical History:  Diagnosis Date  . Abnormality of gait   . Depression   . DVT (deep venous thrombosis) (HCC)    Left leg  . Dyslipidemia   . Fibroids   . Hypertension   . Memory difficulties 12/10/2014   . Multiple sclerosis (HCC)    Right sided weakness and gait disorder, bladder and bowel problems   . Neurogenic bladder   . 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 Overdose of muscle relaxant    Unintentional baclofen overdose   . Trigeminal neuralgia   .  Trigeminal neuralgia    Right  . Wound infection 10/12/2018    PAST SURGICAL HISTORY: Past Surgical History:  Procedure Laterality Date  . AMPUTATION Right 10/17/2018   Procedure: RIGHT BELOW KNEE AMPUTATION;  Surgeon: [name], MD;  Location: [facility];  Service: Orthopedics;  Laterality: Right;  . gamma knife procedure for rt. trigeminal neuralgia Right   . left leg DVT Left   . STRABISMUS SURGERY     Right eye     FAMILY HISTORY: Family History  Problem Relation Age of Onset  . Multiple sclerosis Sister   . Cervical cancer Mother   . Liver disease Father   . Hyperlipidemia Brother   . Pancreatitis Brother   . Hypertension Other   . Heart disease Other   . Uterine cancer Other     SOCIAL HISTORY: Social History   Socioeconomic History  . Marital status: Divorced    Spouse name: Not on file  . Number of children: 2  . Years of education: 52  . Highest education level: Not on file  Occupational History  . Occupation: disabled Education officer, museum  Social Needs  . Financial resource strain: Not on file  . Food insecurity    Worry: Not on file    Inability: Not on file  . Transportation needs    Medical: Not on file    Non-medical: Not on file  Tobacco Use  . Smoking status: Never Smoker  . Smokeless tobacco: Never Used  Substance and Sexual Activity  . Alcohol use: No  . Drug use: No  . Sexual activity: Not Currently    Birth control/protection: Post-menopausal  Lifestyle  . Physical activity    Days per week: Not on file    Minutes per session: Not on file  . Stress: Not on file  Relationships  . Social Herbalist on phone: Not on file    Gets together: Not on file    Attends religious  service: Not on file    Active member of club or organization: Not on file    Attends meetings of clubs or organizations: Not on file    Relationship status: Not on file  . Intimate partner violence    Fear of current or ex partner: Not on file    Emotionally abused: Not on file    Physically abused: Not on file    Forced sexual activity: Not on file  Other Topics Concern  . Not on file  Social History Narrative   Divorced, has a son and daughter. [name] is very involved in her care. [phone] and [phone]   Living with her son, [name] as FT caregiver.  02-09-15 ssy      Patient drinks about 2 sodas daily.   Patient is right handed.     PHYSICAL EXAM  Vitals:   06/04/19 1505  BP: 101/60  Pulse: 69  Temp: 97.8 F (36.6 C)   There is no height or weight on file to calculate BMI.  Generalized: Well developed, in no acute distress  MMSE - Mini Mental State Exam 06/04/2019 07/09/2018 12/26/2017  Orientation to time 1 2 0  Orientation to Place 3 5 3   Registration 3 2 3   Attention/ Calculation 0 0 3  Recall 3 1 2   Language- name 2 objects 2 2 2   Language- repeat 1 0 1  Language- follow 3 step command 3 3 3   Language- read & follow direction 1 1 1   Write  a sentence 0 0 0  Copy design 0 0 0  Total score 17 16 18     Neurological examination  Mentation: Alert oriented, disoriented to time, history provided by son. Follows all commands speech and language fluent Cranial nerve II-XII: Pupils were equal round reactive to light. Extraocular movements were full, visual field were full on confrontational test. Facial sensation and strength were normal.  Head turning and shoulder shrug were normal and symmetric. Motor: The motor testing reveals greater than weakness in the right arm than the left 2/5 RUE, 4/5 LUE, very limited mobility of bilateral lower extremities, greater on the right, prosthesis right lower leg Sensory: Sensory testing is intact to soft touch on all 4 extremities. No  evidence of extinction is noted.  Coordination: Cerebellar testing reveals good finger-nose-finger, unable to perform heel-to-shin Gait and station: In a wheelchair, is not able to stand and bear weight.  Reflexes: Deep tendon reflexes are symmetric  DIAGNOSTIC DATA (LABS, IMAGING, TESTING) - I reviewed patient records, labs, notes, testing and imaging myself where available.  Lab Results  Component Value Date   WBC 8.1 10/18/2018   HGB 9.4 (L) 10/22/2018   HCT 30.2 (L) 10/22/2018   MCV 94.7 10/18/2018   PLT 244 10/18/2018      Component Value Date/Time   NA 140 10/24/2018 0302   NA 146 (H) 07/09/2018 1457   K 4.2 10/24/2018 0302   CL 104 10/24/2018 0302   CO2 27 10/24/2018 0302   GLUCOSE 91 10/24/2018 0302   BUN 10 10/24/2018 0302   BUN 12 07/09/2018 1457   CREATININE 0.79 10/24/2018 0302   CALCIUM 8.7 (L) 10/24/2018 0302   PROT 7.3 10/11/2018 1504   PROT 6.7 07/09/2018 1457   ALBUMIN 3.1 (L) 10/11/2018 1504   ALBUMIN 4.2 07/09/2018 1457   AST 27 10/11/2018 1504   ALT 23 10/11/2018 1504   ALKPHOS 72 10/11/2018 1504   BILITOT 0.2 (L) 10/11/2018 1504   BILITOT <0.2 07/09/2018 1457   GFRNONAA >60 10/24/2018 0302   GFRAA >60 10/24/2018 0302   No results found for: CHOL, HDL, LDLCALC, LDLDIRECT, TRIG, CHOLHDL No results found for: HGBA1C Lab Results  Component Value Date   VITAMINB12 1,624 (H) 10/12/2018   No results found for: TSH  ASSESSMENT AND PLAN 73 y.o. year old female  has a past medical history of Abnormality of gait, Depression, DVT (deep venous thrombosis) ([place]), Dyslipidemia, Fibroids, Hypertension, Memory difficulties (12/10/2014), Multiple sclerosis ([place]), Neurogenic bladder, Organic brain syndrome, Overdose of muscle relaxant, Trigeminal neuralgia, Trigeminal neuralgia, and Wound infection (10/12/2018). here with:  1.  Multiple sclerosis -She has chronic right-sided weakness in her arm; weakness, inability to stand and bear weight in her lower extremities,  urinary incontinence -Continue Avonex injections -Continue baclofen 10 mg tablet, 1 tablet twice during the day, 2 at night -Continue Detrol LA, 2 mg capsule, 1 at bedtime  2.  Dementia -MMSE 17/30 today, stable -Continue Aricept 5 mg at bedtime, may increase in the future  3.  Gait disorder -She is in a wheelchair, nonambulatory -She has a healing sacral ulcer -May start PT, post right BKA amputation now that she has a prosthesis  4.  Trigeminal neuralgia -She had a flare about 1 week ago, used tramadol with benefit, flare subsided -Has done well with dose reduction of Lyrica, continue Lyrica 100 mg twice a day, is much more alert, less side effect -Carbamazepine 200 mg tablet, 1 tablet 3 times a day, 1/2 tablet at bedtime -I  will check lab work today -She will follow-up in 6 months or sooner if needed.  I did advise if her symptoms worsen or she develops any new symptoms she should let us know. Only needs refills on baclofen at this time.    I spent 25 minutes with the patient. 50% of this time was spent discussing her plan of care.   Butler Denmark, AGNP-C, DNP 06/04/2019, 3:11 PM [facility] [address] [place], [place] [address] [phone]

## 2019-06-04 ENCOUNTER — Other Ambulatory Visit: Payer: Self-pay

## 2019-06-04 ENCOUNTER — Ambulatory Visit (INDEPENDENT_AMBULATORY_CARE_PROVIDER_SITE_OTHER): Payer: Medicare HMO | Admitting: Neurology

## 2019-06-04 ENCOUNTER — Encounter: Payer: Self-pay | Admitting: Neurology

## 2019-06-04 VITALS — BP 101/60 | HR 69 | Temp 97.8°F

## 2019-06-04 DIAGNOSIS — F09 Unspecified mental disorder due to known physiological condition: Secondary | ICD-10-CM | POA: Diagnosis not present

## 2019-06-04 DIAGNOSIS — G500 Trigeminal neuralgia: Secondary | ICD-10-CM

## 2019-06-04 DIAGNOSIS — G35 Multiple sclerosis: Secondary | ICD-10-CM | POA: Diagnosis not present

## 2019-06-04 DIAGNOSIS — I10 Essential (primary) hypertension: Secondary | ICD-10-CM | POA: Diagnosis not present

## 2019-06-04 DIAGNOSIS — L89892 Pressure ulcer of other site, stage 2: Secondary | ICD-10-CM | POA: Diagnosis not present

## 2019-06-04 DIAGNOSIS — L89154 Pressure ulcer of sacral region, stage 4: Secondary | ICD-10-CM | POA: Diagnosis not present

## 2019-06-04 DIAGNOSIS — F0280 Dementia in other diseases classified elsewhere without behavioral disturbance: Secondary | ICD-10-CM | POA: Diagnosis not present

## 2019-06-04 DIAGNOSIS — F419 Anxiety disorder, unspecified: Secondary | ICD-10-CM | POA: Diagnosis not present

## 2019-06-04 DIAGNOSIS — F325 Major depressive disorder, single episode, in full remission: Secondary | ICD-10-CM | POA: Diagnosis not present

## 2019-06-04 MED ORDER — BACLOFEN 10 MG PO TABS: 10.0000 mg | ORAL_TABLET | Freq: Four times a day (QID) | ORAL | 1 refills | Status: DC

## 2019-06-04 NOTE — Patient Instructions (Signed)
1. Continue current medications 2. I will check lab work today  3. Return in 6 months

## 2019-06-04 NOTE — Progress Notes (Signed)
I have read the note, and I agree with the clinical assessment and plan.  Emiyah Spraggins K Delmus Warwick   

## 2019-06-05 ENCOUNTER — Telehealth: Payer: Self-pay | Admitting: *Deleted

## 2019-06-05 LAB — CBC WITH DIFFERENTIAL/PLATELET
Basophils Absolute: 0 10*3/uL (ref 0.0–0.2)
Basos: 0 %
EOS (ABSOLUTE): 0 10*3/uL (ref 0.0–0.4)
Eos: 0 %
Hematocrit: 33.8 % — ABNORMAL LOW (ref 34.0–46.6)
Hemoglobin: 11.2 g/dL (ref 11.1–15.9)
Immature Grans (Abs): 0 10*3/uL (ref 0.0–0.1)
Immature Granulocytes: 0 %
Lymphocytes Absolute: 1.2 10*3/uL (ref 0.7–3.1)
Lymphs: 27 %
MCH: 30.8 pg (ref 26.6–33.0)
MCHC: 33.1 g/dL (ref 31.5–35.7)
MCV: 93 fL (ref 79–97)
Monocytes Absolute: 0.6 10*3/uL (ref 0.1–0.9)
Monocytes: 13 %
Neutrophils Absolute: 2.7 10*3/uL (ref 1.4–7.0)
Neutrophils: 60 %
Platelets: 202 10*3/uL (ref 150–450)
RBC: 3.64 x10E6/uL — ABNORMAL LOW (ref 3.77–5.28)
RDW: 12.8 % (ref 11.7–15.4)
WBC: 4.5 10*3/uL (ref 3.4–10.8)

## 2019-06-05 LAB — COMPREHENSIVE METABOLIC PANEL
ALT: 16 IU/L (ref 0–32)
AST: 22 IU/L (ref 0–40)
Albumin/Globulin Ratio: 1.4 (ref 1.2–2.2)
Albumin: 3.9 g/dL (ref 3.8–4.8)
Alkaline Phosphatase: 115 IU/L (ref 39–117)
BUN/Creatinine Ratio: 35 — ABNORMAL HIGH (ref 12–28)
BUN: 21 mg/dL (ref 8–27)
Bilirubin Total: <0.2 mg/dL (ref 0.0–1.2)
CO2: 22 mmol/L (ref 20–29)
Calcium: 9.6 mg/dL (ref 8.7–10.3)
Chloride: 104 mmol/L (ref 96–106)
Creatinine, Ser: 0.6 mg/dL (ref 0.57–1.00)
GFR calc Af Amer: 107 mL/min/{1.73_m2} (ref >59)
GFR calc non Af Amer: 93 mL/min/{1.73_m2} (ref >59)
Globulin, Total: 2.7 g/dL (ref 1.5–4.5)
Glucose: 106 mg/dL — ABNORMAL HIGH (ref 65–99)
Potassium: 4 mmol/L (ref 3.5–5.2)
Sodium: 142 mmol/L (ref 134–144)
Total Protein: 6.6 g/dL (ref 6.0–8.5)

## 2019-06-05 LAB — CARBAMAZEPINE LEVEL, TOTAL: Carbamazepine (Tegretol), S: 10.9 ug/mL (ref 4.0–12.0)

## 2019-06-05 NOTE — Telephone Encounter (Signed)
I called the pt and LVM (ok per DPR) advising her blood work results are unremarkable, no concerns. I advised per Dr. Jannifer Franklin, the carbamazepine level is in therapeutic range so we do not need to make any changes to her dosage at this time. I reviewed the pt's current dosage and instructions and asked for a call back if she has any questions. Left office number in message.

## 2019-06-05 NOTE — Telephone Encounter (Signed)
-----   Message from Kathrynn Ducking, MD sent at 06/05/2019 12:57 PM EDT -----  The blood work results are unremarkable.  Carbamazepine level is in the therapeutic range, no alteration in dosing at this time. Please call the patient. ----- Message ----- From: Brandon Melnick, RN Sent: 06/05/2019  11:54 AM EDT To: Kathrynn Ducking, MD   ----- Message ----- From: Lavone Neri Lab Results In Sent: 06/05/2019   5:38 AM EDT To: Suzzanne Cloud, NP

## 2019-06-11 DIAGNOSIS — F419 Anxiety disorder, unspecified: Secondary | ICD-10-CM | POA: Diagnosis not present

## 2019-06-11 DIAGNOSIS — G35 Multiple sclerosis: Secondary | ICD-10-CM | POA: Diagnosis not present

## 2019-06-11 DIAGNOSIS — F09 Unspecified mental disorder due to known physiological condition: Secondary | ICD-10-CM | POA: Diagnosis not present

## 2019-06-11 DIAGNOSIS — F325 Major depressive disorder, single episode, in full remission: Secondary | ICD-10-CM | POA: Diagnosis not present

## 2019-06-11 DIAGNOSIS — L89154 Pressure ulcer of sacral region, stage 4: Secondary | ICD-10-CM | POA: Diagnosis not present

## 2019-06-11 DIAGNOSIS — L89892 Pressure ulcer of other site, stage 2: Secondary | ICD-10-CM | POA: Diagnosis not present

## 2019-06-11 DIAGNOSIS — G500 Trigeminal neuralgia: Secondary | ICD-10-CM | POA: Diagnosis not present

## 2019-06-11 DIAGNOSIS — F0280 Dementia in other diseases classified elsewhere without behavioral disturbance: Secondary | ICD-10-CM | POA: Diagnosis not present

## 2019-06-11 DIAGNOSIS — I10 Essential (primary) hypertension: Secondary | ICD-10-CM | POA: Diagnosis not present

## 2019-06-18 DIAGNOSIS — G500 Trigeminal neuralgia: Secondary | ICD-10-CM | POA: Diagnosis not present

## 2019-06-18 DIAGNOSIS — L89154 Pressure ulcer of sacral region, stage 4: Secondary | ICD-10-CM | POA: Diagnosis not present

## 2019-06-18 DIAGNOSIS — F419 Anxiety disorder, unspecified: Secondary | ICD-10-CM | POA: Diagnosis not present

## 2019-06-18 DIAGNOSIS — I10 Essential (primary) hypertension: Secondary | ICD-10-CM | POA: Diagnosis not present

## 2019-06-18 DIAGNOSIS — F325 Major depressive disorder, single episode, in full remission: Secondary | ICD-10-CM | POA: Diagnosis not present

## 2019-06-18 DIAGNOSIS — G35 Multiple sclerosis: Secondary | ICD-10-CM | POA: Diagnosis not present

## 2019-06-18 DIAGNOSIS — L89892 Pressure ulcer of other site, stage 2: Secondary | ICD-10-CM | POA: Diagnosis not present

## 2019-06-18 DIAGNOSIS — F09 Unspecified mental disorder due to known physiological condition: Secondary | ICD-10-CM | POA: Diagnosis not present

## 2019-06-18 DIAGNOSIS — F0280 Dementia in other diseases classified elsewhere without behavioral disturbance: Secondary | ICD-10-CM | POA: Diagnosis not present

## 2019-06-24 DIAGNOSIS — F325 Major depressive disorder, single episode, in full remission: Secondary | ICD-10-CM | POA: Diagnosis not present

## 2019-06-24 DIAGNOSIS — L89892 Pressure ulcer of other site, stage 2: Secondary | ICD-10-CM | POA: Diagnosis not present

## 2019-06-24 DIAGNOSIS — F09 Unspecified mental disorder due to known physiological condition: Secondary | ICD-10-CM | POA: Diagnosis not present

## 2019-06-24 DIAGNOSIS — L89154 Pressure ulcer of sacral region, stage 4: Secondary | ICD-10-CM | POA: Diagnosis not present

## 2019-06-24 DIAGNOSIS — F0280 Dementia in other diseases classified elsewhere without behavioral disturbance: Secondary | ICD-10-CM | POA: Diagnosis not present

## 2019-06-24 DIAGNOSIS — I10 Essential (primary) hypertension: Secondary | ICD-10-CM | POA: Diagnosis not present

## 2019-06-24 DIAGNOSIS — G35 Multiple sclerosis: Secondary | ICD-10-CM | POA: Diagnosis not present

## 2019-06-24 DIAGNOSIS — F419 Anxiety disorder, unspecified: Secondary | ICD-10-CM | POA: Diagnosis not present

## 2019-06-24 DIAGNOSIS — G500 Trigeminal neuralgia: Secondary | ICD-10-CM | POA: Diagnosis not present

## 2019-07-01 DIAGNOSIS — L89154 Pressure ulcer of sacral region, stage 4: Secondary | ICD-10-CM | POA: Diagnosis not present

## 2019-07-01 DIAGNOSIS — E7800 Pure hypercholesterolemia, unspecified: Secondary | ICD-10-CM | POA: Diagnosis not present

## 2019-07-01 DIAGNOSIS — R32 Unspecified urinary incontinence: Secondary | ICD-10-CM | POA: Diagnosis not present

## 2019-07-01 DIAGNOSIS — F419 Anxiety disorder, unspecified: Secondary | ICD-10-CM | POA: Diagnosis not present

## 2019-07-01 DIAGNOSIS — G35 Multiple sclerosis: Secondary | ICD-10-CM | POA: Diagnosis not present

## 2019-07-01 DIAGNOSIS — L89152 Pressure ulcer of sacral region, stage 2: Secondary | ICD-10-CM | POA: Diagnosis not present

## 2019-07-01 DIAGNOSIS — G500 Trigeminal neuralgia: Secondary | ICD-10-CM | POA: Diagnosis not present

## 2019-07-01 DIAGNOSIS — F09 Unspecified mental disorder due to known physiological condition: Secondary | ICD-10-CM | POA: Diagnosis not present

## 2019-07-01 DIAGNOSIS — F325 Major depressive disorder, single episode, in full remission: Secondary | ICD-10-CM | POA: Diagnosis not present

## 2019-07-01 DIAGNOSIS — F0391 Unspecified dementia with behavioral disturbance: Secondary | ICD-10-CM | POA: Diagnosis not present

## 2019-07-01 DIAGNOSIS — F0280 Dementia in other diseases classified elsewhere without behavioral disturbance: Secondary | ICD-10-CM | POA: Diagnosis not present

## 2019-07-01 DIAGNOSIS — I10 Essential (primary) hypertension: Secondary | ICD-10-CM | POA: Diagnosis not present

## 2019-07-02 DIAGNOSIS — Z1231 Encounter for screening mammogram for malignant neoplasm of breast: Secondary | ICD-10-CM | POA: Diagnosis not present

## 2019-07-04 DIAGNOSIS — R32 Unspecified urinary incontinence: Secondary | ICD-10-CM | POA: Diagnosis not present

## 2019-07-04 DIAGNOSIS — G35 Multiple sclerosis: Secondary | ICD-10-CM | POA: Diagnosis not present

## 2019-07-04 DIAGNOSIS — L89309 Pressure ulcer of unspecified buttock, unspecified stage: Secondary | ICD-10-CM | POA: Diagnosis not present

## 2019-07-04 DIAGNOSIS — L8992 Pressure ulcer of unspecified site, stage 2: Secondary | ICD-10-CM | POA: Diagnosis not present

## 2019-07-09 DIAGNOSIS — N6002 Solitary cyst of left breast: Secondary | ICD-10-CM | POA: Diagnosis not present

## 2019-07-11 DIAGNOSIS — I10 Essential (primary) hypertension: Secondary | ICD-10-CM | POA: Diagnosis not present

## 2019-07-11 DIAGNOSIS — G500 Trigeminal neuralgia: Secondary | ICD-10-CM | POA: Diagnosis not present

## 2019-07-11 DIAGNOSIS — L89154 Pressure ulcer of sacral region, stage 4: Secondary | ICD-10-CM | POA: Diagnosis not present

## 2019-07-11 DIAGNOSIS — F0280 Dementia in other diseases classified elsewhere without behavioral disturbance: Secondary | ICD-10-CM | POA: Diagnosis not present

## 2019-07-11 DIAGNOSIS — F419 Anxiety disorder, unspecified: Secondary | ICD-10-CM | POA: Diagnosis not present

## 2019-07-11 DIAGNOSIS — F325 Major depressive disorder, single episode, in full remission: Secondary | ICD-10-CM | POA: Diagnosis not present

## 2019-07-11 DIAGNOSIS — E7800 Pure hypercholesterolemia, unspecified: Secondary | ICD-10-CM | POA: Diagnosis not present

## 2019-07-11 DIAGNOSIS — F09 Unspecified mental disorder due to known physiological condition: Secondary | ICD-10-CM | POA: Diagnosis not present

## 2019-07-11 DIAGNOSIS — G35 Multiple sclerosis: Secondary | ICD-10-CM | POA: Diagnosis not present

## 2019-07-18 DIAGNOSIS — F09 Unspecified mental disorder due to known physiological condition: Secondary | ICD-10-CM | POA: Diagnosis not present

## 2019-07-18 DIAGNOSIS — G500 Trigeminal neuralgia: Secondary | ICD-10-CM | POA: Diagnosis not present

## 2019-07-18 DIAGNOSIS — F0280 Dementia in other diseases classified elsewhere without behavioral disturbance: Secondary | ICD-10-CM | POA: Diagnosis not present

## 2019-07-18 DIAGNOSIS — G35 Multiple sclerosis: Secondary | ICD-10-CM | POA: Diagnosis not present

## 2019-07-18 DIAGNOSIS — F325 Major depressive disorder, single episode, in full remission: Secondary | ICD-10-CM | POA: Diagnosis not present

## 2019-07-18 DIAGNOSIS — E7800 Pure hypercholesterolemia, unspecified: Secondary | ICD-10-CM | POA: Diagnosis not present

## 2019-07-18 DIAGNOSIS — F419 Anxiety disorder, unspecified: Secondary | ICD-10-CM | POA: Diagnosis not present

## 2019-07-18 DIAGNOSIS — L89154 Pressure ulcer of sacral region, stage 4: Secondary | ICD-10-CM | POA: Diagnosis not present

## 2019-07-18 DIAGNOSIS — I10 Essential (primary) hypertension: Secondary | ICD-10-CM | POA: Diagnosis not present

## 2019-07-24 ENCOUNTER — Telehealth: Payer: Self-pay | Admitting: Neurology

## 2019-07-24 MED ORDER — TRAMADOL HCL 50 MG PO TABS: 50.0000 mg | ORAL_TABLET | Freq: Four times a day (QID) | ORAL | 1 refills | Status: DC | PRN

## 2019-07-24 NOTE — Telephone Encounter (Signed)
I will send in the prescription for Ultram.

## 2019-07-24 NOTE — Telephone Encounter (Signed)
Pt is requesting a refill of traMADol (ULTRAM) 50 MG tablet , [id number] [place] [place] [facility] [address]

## 2019-07-24 NOTE — Telephone Encounter (Signed)
[place] drug registry has been verified. Last refill was 04/09/2019 # 30. Pt has been compliant with f/u's.

## 2019-07-25 DIAGNOSIS — F419 Anxiety disorder, unspecified: Secondary | ICD-10-CM | POA: Diagnosis not present

## 2019-07-25 DIAGNOSIS — L89154 Pressure ulcer of sacral region, stage 4: Secondary | ICD-10-CM | POA: Diagnosis not present

## 2019-07-25 DIAGNOSIS — G35 Multiple sclerosis: Secondary | ICD-10-CM | POA: Diagnosis not present

## 2019-07-25 DIAGNOSIS — E7800 Pure hypercholesterolemia, unspecified: Secondary | ICD-10-CM | POA: Diagnosis not present

## 2019-07-25 DIAGNOSIS — G500 Trigeminal neuralgia: Secondary | ICD-10-CM | POA: Diagnosis not present

## 2019-07-25 DIAGNOSIS — F09 Unspecified mental disorder due to known physiological condition: Secondary | ICD-10-CM | POA: Diagnosis not present

## 2019-07-25 DIAGNOSIS — F325 Major depressive disorder, single episode, in full remission: Secondary | ICD-10-CM | POA: Diagnosis not present

## 2019-07-25 DIAGNOSIS — I10 Essential (primary) hypertension: Secondary | ICD-10-CM | POA: Diagnosis not present

## 2019-07-25 DIAGNOSIS — F0280 Dementia in other diseases classified elsewhere without behavioral disturbance: Secondary | ICD-10-CM | POA: Diagnosis not present

## 2019-07-29 ENCOUNTER — Telehealth: Payer: Self-pay | Admitting: Neurology

## 2019-07-29 MED ORDER — PREGABALIN 50 MG PO CAPS: ORAL_CAPSULE | ORAL | 0 refills | Status: DC

## 2019-07-29 NOTE — Telephone Encounter (Signed)
Phone rep checked office voicemail; Pt son(on DPR) called asking for a call to discuss shooting nerve pain pt is experiencing.  Please call   this voicemail was left @9 :28a.m.

## 2019-07-29 NOTE — Addendum Note (Signed)
Addended by: Suzzanne Cloud on: 07/29/2019 03:51 PM   Modules accepted: Orders

## 2019-07-29 NOTE — Telephone Encounter (Signed)
I called pt spoke to son, doug.  Pt had flare this am, breathing thru nose causing lot of pain.  Carbamazepine takes 300mg  po am then 200mg  noon, then night,  lyrica 100mg  po bid.  Gave tramadol.  Pt sleeping now.  Asking if they can increase carbamazepine any when she has a flare (max dose) along with other meds?

## 2019-07-29 NOTE — Telephone Encounter (Signed)
I called the patient and spoke to her son.  He indicates for the last several days she has been complaining of intermittent trigeminal neuralgia pain, like a flare pain.  He will give her tramadol with relief.  In the past she has been treated with Decadron, but this causes thrush and he is hesitant to try this.  She remains on carbamazepine 200 mg tablet, 1 tablet 3 times a day, 1/2 tablet at bedtime.  Recent blood draw showed a high end of normal carbamazepine level of 10.9. I will not increase this at this time.  She has been on Lyrica 150 mg twice daily, her dose was decreased 100 mg twice a day in April of this year due to drowsiness.  I will go up on Lyrica taking 100 mg in the morning, 150 mg in the evening (I have sent in 50 mg capsules to take along with the 100 mg).  She has tried Keppra in the past but this made her too sleepy.  If she is not better in a couple days, we may have to consider Decadron.

## 2019-07-30 ENCOUNTER — Other Ambulatory Visit: Payer: Self-pay | Admitting: Neurology

## 2019-07-31 ENCOUNTER — Other Ambulatory Visit: Payer: Self-pay | Admitting: Neurology

## 2019-07-31 ENCOUNTER — Telehealth: Payer: Self-pay | Admitting: Neurology

## 2019-07-31 MED ORDER — PREGABALIN 100 MG PO CAPS: 100.0000 mg | ORAL_CAPSULE | Freq: Two times a day (BID) | ORAL | 5 refills | Status: DC

## 2019-07-31 NOTE — Telephone Encounter (Signed)
You did this for only a month.  Did you want to do give more refills?  I sent you pended for her 100mg  po bid that she needed too.

## 2019-07-31 NOTE — Progress Notes (Signed)
The Lyrica 100 mg twice daily will be refilled. She is taking Lyrica 50 mg along with the 100 mg evening dose while she is dealing with a flare.

## 2019-07-31 NOTE — Telephone Encounter (Signed)
Pt's son called stating that the pt is needing a letter to excuse her from [employer] and also that she is needing a refill on her pregabalin (LYRICA) 100 MG capsule [facility] [address]

## 2019-07-31 NOTE — Telephone Encounter (Signed)
Drug registry checked  Last fill 06-27-19 #60. Lyrica 100mg  caps

## 2019-08-01 DIAGNOSIS — I10 Essential (primary) hypertension: Secondary | ICD-10-CM | POA: Diagnosis not present

## 2019-08-01 DIAGNOSIS — F325 Major depressive disorder, single episode, in full remission: Secondary | ICD-10-CM | POA: Diagnosis not present

## 2019-08-01 DIAGNOSIS — F0391 Unspecified dementia with behavioral disturbance: Secondary | ICD-10-CM | POA: Diagnosis not present

## 2019-08-01 DIAGNOSIS — E7800 Pure hypercholesterolemia, unspecified: Secondary | ICD-10-CM | POA: Diagnosis not present

## 2019-08-01 DIAGNOSIS — L89154 Pressure ulcer of sacral region, stage 4: Secondary | ICD-10-CM | POA: Diagnosis not present

## 2019-08-01 DIAGNOSIS — F0280 Dementia in other diseases classified elsewhere without behavioral disturbance: Secondary | ICD-10-CM | POA: Diagnosis not present

## 2019-08-01 DIAGNOSIS — G500 Trigeminal neuralgia: Secondary | ICD-10-CM | POA: Diagnosis not present

## 2019-08-01 DIAGNOSIS — L89152 Pressure ulcer of sacral region, stage 2: Secondary | ICD-10-CM | POA: Diagnosis not present

## 2019-08-01 DIAGNOSIS — G35 Multiple sclerosis: Secondary | ICD-10-CM | POA: Diagnosis not present

## 2019-08-01 DIAGNOSIS — F09 Unspecified mental disorder due to known physiological condition: Secondary | ICD-10-CM | POA: Diagnosis not present

## 2019-08-01 DIAGNOSIS — Z1211 Encounter for screening for malignant neoplasm of colon: Secondary | ICD-10-CM | POA: Diagnosis not present

## 2019-08-01 DIAGNOSIS — F419 Anxiety disorder, unspecified: Secondary | ICD-10-CM | POA: Diagnosis not present

## 2019-08-01 NOTE — Telephone Encounter (Signed)
error 

## 2019-08-03 DIAGNOSIS — R32 Unspecified urinary incontinence: Secondary | ICD-10-CM | POA: Diagnosis not present

## 2019-08-03 DIAGNOSIS — L8992 Pressure ulcer of unspecified site, stage 2: Secondary | ICD-10-CM | POA: Diagnosis not present

## 2019-08-03 DIAGNOSIS — L89309 Pressure ulcer of unspecified buttock, unspecified stage: Secondary | ICD-10-CM | POA: Diagnosis not present

## 2019-08-03 DIAGNOSIS — G35 Multiple sclerosis: Secondary | ICD-10-CM | POA: Diagnosis not present

## 2019-08-05 ENCOUNTER — Encounter: Payer: Self-pay | Admitting: *Deleted

## 2019-08-05 ENCOUNTER — Telehealth: Payer: Self-pay | Admitting: *Deleted

## 2019-08-05 NOTE — Telephone Encounter (Signed)
I have signed the jury duty excuse letter. Given to medical records.

## 2019-08-05 NOTE — Telephone Encounter (Signed)
Received letter for jury summons on pt.  Wrote letter for pt, relaying she is not able to report to jury duty.  To SS/NP for review and signature.

## 2019-08-06 ENCOUNTER — Telehealth: Payer: Self-pay | Admitting: *Deleted

## 2019-08-06 ENCOUNTER — Other Ambulatory Visit: Payer: Self-pay | Admitting: *Deleted

## 2019-08-06 MED ORDER — AVONEX PEN 30 MCG/0.5ML IM AJKT: 30.0000 ug | AUTO-INJECTOR | INTRAMUSCULAR | 1 refills | Status: DC

## 2019-08-06 NOTE — Telephone Encounter (Signed)
Pt jury summons mailed out on 08/06/19 to [address] [place] [id number]

## 2019-08-12 ENCOUNTER — Telehealth: Payer: Self-pay | Admitting: Neurology

## 2019-08-12 MED ORDER — PREGABALIN 50 MG PO CAPS: ORAL_CAPSULE | ORAL | 0 refills | Status: DC

## 2019-08-12 NOTE — Telephone Encounter (Signed)
I called her son, [name]. She has had increased right trigeminal neuralgia pain over the weekend.  We will increase the Lyrica to 150 mg twice a day.  If the pain does not improve, we should try a Decadron taper along with clotrimazole because she is prone to thrush or Keppra.  Her son does not want to try the Decadron at this time, because of thrush.  Keppra made her very sleepy. She is taking Tramadol. She will remain on Carbamazepine, drug level was on higher end of normal at 10.9 in August. She had gamma knife in 2010. She may need to be referred to [facility] for gamma knife again.

## 2019-08-12 NOTE — Telephone Encounter (Signed)
Pts son [name](not on DPR) called in and stated that pt has been experiencing some bad nerve pain

## 2019-08-15 DIAGNOSIS — G500 Trigeminal neuralgia: Secondary | ICD-10-CM | POA: Diagnosis not present

## 2019-08-15 DIAGNOSIS — L89154 Pressure ulcer of sacral region, stage 4: Secondary | ICD-10-CM | POA: Diagnosis not present

## 2019-08-15 DIAGNOSIS — E7800 Pure hypercholesterolemia, unspecified: Secondary | ICD-10-CM | POA: Diagnosis not present

## 2019-08-15 DIAGNOSIS — F419 Anxiety disorder, unspecified: Secondary | ICD-10-CM | POA: Diagnosis not present

## 2019-08-15 DIAGNOSIS — G35 Multiple sclerosis: Secondary | ICD-10-CM | POA: Diagnosis not present

## 2019-08-15 DIAGNOSIS — I10 Essential (primary) hypertension: Secondary | ICD-10-CM | POA: Diagnosis not present

## 2019-08-15 DIAGNOSIS — F09 Unspecified mental disorder due to known physiological condition: Secondary | ICD-10-CM | POA: Diagnosis not present

## 2019-08-15 DIAGNOSIS — F0280 Dementia in other diseases classified elsewhere without behavioral disturbance: Secondary | ICD-10-CM | POA: Diagnosis not present

## 2019-08-15 DIAGNOSIS — F325 Major depressive disorder, single episode, in full remission: Secondary | ICD-10-CM | POA: Diagnosis not present

## 2019-08-26 ENCOUNTER — Other Ambulatory Visit: Payer: Medicare HMO | Admitting: Hospice

## 2019-08-26 DIAGNOSIS — G35 Multiple sclerosis: Secondary | ICD-10-CM

## 2019-08-26 DIAGNOSIS — Z515 Encounter for palliative care: Secondary | ICD-10-CM | POA: Diagnosis not present

## 2019-08-26 NOTE — Progress Notes (Signed)
[facility] Consult Note Telephone: [phone]  Fax: [phone]  PATIENT NAME: [name] DOB: 06/26/1946 MRN: [id number]  PRIMARY CARE PROVIDER:   Maurice Small, MD  REFERRING PROVIDER:  Maurice Small, MD [place],   [address]  RESPONSIBLE PARTY:   [name], son [phone]   TELEHEALTH VISIT STATEMENT Due to the COVID-19 crisis, this visit was done via telephone from my office. It was initiated and consented to by this patient and/or family.  RECOMMENDATIONS/PLAN:   Advance Care Planning/Goals of Care: Telehealth Visit consisted of building trust and discussions on Palliative Medicine as specialized medical care for people living with serious illness, aimed at facilitating advance care plan, symptoms relief and establishing goals of care. Goals of care include to maximize quality of life and symptom management. Patient remains a full code. Patient and son [name] are bored at home; tired of watching movies and TVs. Therapeutic listening provided. Discussed other activities like indoor games instead of only watching TV. Symptom management: [name] reported sacral wound resolved/closed up and [facility] nurse stopped coming 2 weeks ago. Now uses skin protectant Desitin in her sacrum. Education provided on need for repositioning, regular hygiene care and use of Desitin cream as ordered. She continues to use air mattress to relieve pressure and help prevent pressure wounds. CNAs from [place] help provide care 5 days a week and [name] does same during the weekends. Patient had a trigeminal neuralgia flare; Lyrica recently increased to 100mg  TID, effective.  Follow up: Palliative care will continue to follow patient for goals of care clarification and symptom management  I spent 30 minutes providing this consultation, from 4.30pm to 5.00pm. More than 50% of the time in this consultation was spent on  coordinating communication HISTORY OF PRESENT ILLNESS:  [name] is a 73 y.o. year old female with multiple medical problems including MS, trigeminal neuralgia right side, HTN . Palliative Care was asked to help address goals of care.   CODE STATUS: Full  PPS: 40% HOSPICE ELIGIBILITY/DIAGNOSIS: TBD  PAST MEDICAL HISTORY:  Past Medical History:  Diagnosis Date  . Abnormality of gait   . Depression   . DVT (deep venous thrombosis) (HCC)    Left leg  . Dyslipidemia   . Fibroids   . Hypertension   . Memory difficulties 12/10/2014  . Multiple sclerosis (HCC)    Right sided weakness and gait disorder, bladder and bowel problems   . Neurogenic bladder   . 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 Overdose of muscle relaxant    Unintentional baclofen overdose   . Trigeminal neuralgia   . Trigeminal neuralgia    Right  . Wound infection 10/12/2018    SOCIAL HX:  Social History   Tobacco Use  . Smoking status: Never Smoker  . Smokeless tobacco: Never Used  Substance Use Topics  . Alcohol use: No    ALLERGIES:  Allergies  Allergen Reactions  . Prednisone     Had gotten thrush  . Shellfish Allergy     Hives      PERTINENT MEDICATIONS:  Outpatient Encounter Medications as of 08/26/2019  Medication Sig  . acetaminophen (TYLENOL) 500 MG tablet Take 500 mg by mouth every 6 (six) hours as needed for mild pain.   Marland Kitchen amLODipine (NORVASC) 10 MG tablet Take 1 tablet (10 mg total) by mouth daily.  . APPLE CIDER VINEGAR PO Take 1 capsule by  mouth daily. 450mg   . atorvastatin (LIPITOR) 40 MG tablet Take 40 mg by mouth daily.    . B Complex-C (B-COMPLEX WITH VITAMIN C) tablet Take 1 tablet by mouth daily.  . baclofen (LIORESAL) 10 MG tablet Take 1 tablet (10 mg total) by mouth 4 (four) times daily.  Marland Kitchen buPROPion (WELLBUTRIN XL) 300 MG 24 hr tablet Take 300 mg by mouth daily.  . carbamazepine (TEGRETOL) 200 MG tablet TAKE 1 TABLET BY MOUTH 3 TIMES A DAY AND 1/2 TABLET AT BEDTIME  AT BEDTIME  . Cholecalciferol ([facility]S VIT D 5000 HIGH-POTENCY PO) Take 5,000 Int'l Units/L by mouth daily.  Marland Kitchen CRANBERRY PO Take by mouth.  . Diaper Rash Products (DESITIN CLEAR EX) Apply topically.  . docusate sodium (COLACE) 100 MG capsule Take 1 capsule (100 mg total) by mouth 2 (two) times daily.  Marland Kitchen donepezil (ARICEPT) 5 MG tablet TAKE 1 TABLET BY MOUTH EVERY DAY  . FIBER SELECT GUMMIES PO Take 5 g by mouth 2 (two) times daily.   . fish oil-omega-3 fatty acids 1000 MG capsule Take 2 g by mouth 3 (three) times daily.  . hydrALAZINE (APRESOLINE) 10 MG tablet   . hydrochlorothiazide (HYDRODIURIL) 25 MG tablet TAKE 1 TABLET BY MOUTH EVERY DAY IN THE MORNING  . Interferon Beta-1a (AVONEX PEN) 30 MCG/0.5ML AJKT Inject 30 mcg into the muscle every 7 (seven) days.  Marland Kitchen labetalol (NORMODYNE) 100 MG tablet Take 100 mg by mouth 2 (two) times daily.  . Multiple Vitamins-Minerals (MULTIVITAMINS THER. W/MINERALS) TABS Take 1 tablet by mouth daily.    . Potassium Chloride (KLOR-CON PO) Take 20 mEq by mouth 3 (three) times daily.   . pregabalin (LYRICA) 100 MG capsule Take 1 capsule (100 mg total) by mouth 2 (two) times daily.  . pregabalin (LYRICA) 50 MG capsule Take 1 capsule twice a day along with her 100 mg dose to total 150 mg twice daily  . Probiotic Product (PROBIOTIC PO) Take by mouth.  . sertraline (ZOLOFT) 50 MG tablet Take 75 mg by mouth daily. One and one half tablet once a day in morning.   . Skin Protectants, Misc. (WHITE PETROLATUM-ZINC OXIDE) cream Apply topically as needed. (Patient taking differently: Apply 1 application topically daily as needed for dry skin or wound care. )  . tolterodine (DETROL LA) 2 MG 24 hr capsule Take 1 capsule (2 mg total) by mouth at bedtime.  . traMADol (ULTRAM) 50 MG tablet Take 1 tablet (50 mg total) by mouth every 6 (six) hours as needed for moderate pain.  . TURMERIC PO Take 450 mg by mouth.   No facility-administered encounter medications on file as of  08/26/2019.     Teodoro Spray, NP

## 2019-08-27 ENCOUNTER — Other Ambulatory Visit: Payer: Self-pay

## 2019-08-27 ENCOUNTER — Other Ambulatory Visit: Payer: Self-pay | Admitting: Neurology

## 2019-08-27 NOTE — Telephone Encounter (Signed)
Called son, [name] on [place] and advised him the lyrica 100 mg was refilled 10/21 x 6 months,  50 mg refilled on 11/2 x 1 month. He stated she only has dose for tonight, and [facility]. I advised will [facility]. [facility], spoke with [name] who stated that she shouldn't have run out. [name] can refill 50 mg tonight and 100 mg tomorrow. She stated to have son call before he comes to get refill tomorrow. I called son and advised him of conversation with [name] and recommended he call for refills 2 days before due. He  verbalized understanding, appreciation.

## 2019-08-27 NOTE — Telephone Encounter (Signed)
Patient's son, [name], stopped by the office today stating patient is out of Lyrica.  He had called the phone staff who indicated there was not a DPR on file to talk to him. There was one in her chart for 2015 but he did sign a new one today as he is her POA and dropped off POA papers to be scanned.  [facility] [address]. Son would like a call back regarding medication Lyrica [phone]

## 2019-09-02 DIAGNOSIS — Z1211 Encounter for screening for malignant neoplasm of colon: Secondary | ICD-10-CM | POA: Diagnosis not present

## 2019-09-03 DIAGNOSIS — L8992 Pressure ulcer of unspecified site, stage 2: Secondary | ICD-10-CM | POA: Diagnosis not present

## 2019-09-03 DIAGNOSIS — R32 Unspecified urinary incontinence: Secondary | ICD-10-CM | POA: Diagnosis not present

## 2019-09-03 DIAGNOSIS — L89309 Pressure ulcer of unspecified buttock, unspecified stage: Secondary | ICD-10-CM | POA: Diagnosis not present

## 2019-09-03 DIAGNOSIS — G35 Multiple sclerosis: Secondary | ICD-10-CM | POA: Diagnosis not present

## 2019-09-17 ENCOUNTER — Other Ambulatory Visit: Payer: Self-pay | Admitting: Neurology

## 2019-09-24 ENCOUNTER — Other Ambulatory Visit: Payer: Self-pay | Admitting: Neurology

## 2019-09-26 NOTE — Telephone Encounter (Signed)
Please call the patient's son to see if she needs the higher dose, if so I am happy to refill.

## 2019-09-26 NOTE — Telephone Encounter (Signed)
I called her son. The medications are working for her. He doesn't want to try the Decadron due to thrush, he doesn't want the referral for gamma knife. She is drowsy, but her pain is controlled. He thinks what they are doing is working. I have sent in enough Lyrica 50 mg for 1 month to take in addition to the 100 mg tablet.

## 2019-09-26 NOTE — Telephone Encounter (Signed)
I called pts son [name] about how his mom is tolerating pregablin. He stated pt is tolerating it well.She is taking pregablin 150am and 150 in the evening. After he gives her the morning dosage of pregablin pt starts to complain of pain and he gives her tramadol. He gives her the tramadol twice a day every day. Sometimes he may give her tylenol. HE reported she is groggy and sleepy most of the day. But she is tolerating the medication. I stated message will be sent to Sarah NP for review.

## 2019-10-03 DIAGNOSIS — R32 Unspecified urinary incontinence: Secondary | ICD-10-CM | POA: Diagnosis not present

## 2019-10-03 DIAGNOSIS — L89309 Pressure ulcer of unspecified buttock, unspecified stage: Secondary | ICD-10-CM | POA: Diagnosis not present

## 2019-10-03 DIAGNOSIS — G35 Multiple sclerosis: Secondary | ICD-10-CM | POA: Diagnosis not present

## 2019-10-03 DIAGNOSIS — L8992 Pressure ulcer of unspecified site, stage 2: Secondary | ICD-10-CM | POA: Diagnosis not present

## 2019-10-09 ENCOUNTER — Other Ambulatory Visit: Payer: Self-pay

## 2019-10-09 ENCOUNTER — Other Ambulatory Visit: Payer: Medicare HMO | Admitting: Hospice

## 2019-10-09 DIAGNOSIS — Z515 Encounter for palliative care: Secondary | ICD-10-CM | POA: Diagnosis not present

## 2019-10-09 DIAGNOSIS — G35 Multiple sclerosis: Secondary | ICD-10-CM

## 2019-10-09 NOTE — Progress Notes (Signed)
[facility] Consult Note Telephone: [phone]  Fax: [phone]  PATIENT NAME: [name] DOB: June 14, 1946 MRN: [id number]  PRIMARY CARE PROVIDER:   Maurice Small, MD  REFERRING PROVIDER:  Maurice Small, MD [address] [place],  [place] [address]  RESPONSIBLE PARTY:  Danajia Goodner, son [phone]   TELEHEALTH VISIT STATEMENT Due to the COVID-19 crisis, this visit was done via telephone from my office. It was initiated and consented to by this patient and/or family.  RECOMMENDATIONS/PLAN:   Advance Care Planning/Goals of Care: Telehealth Visit consisted of building trust and follow up on patient's health status. Patient stable at this time with no complaints/concerns.  Patient remains a full code. With goals of care including to maximize quality of life and symptom management.  Symptom management:  Patient denied pain/discomfort. [name] confirmed patient's pain is now controlled; no trigeminal neuralgia flare; she continues on Lyrica as ordered - 100mg  TID.  Also sacral wound resolved/closed up and [place] stopped coming . Continues to apply Desitin cream protectant BID.Marland Kitchen She continues to use air mattress to relieve pressure and help prevent pressure wounds. CNAs from [place] help provide care 5 days a week and [name] does same during the weekends.  Follow up: Palliative care will continue to follow patient for goals of care clarification and symptom management. I spent 30 minutes providing this consultation, from 1pm to 1.30pm.  More than 50% of the time in this consultation was spent on coordinating communication [place] a 73 y.o.year oldfemalewith multiple medical problems including MS, trigeminal neuralgia right side, HTN. Palliative Care was asked to help address goals of care.   CODE STATUS: Full  PPS: 40% HOSPICE ELIGIBILITY/DIAGNOSIS: TBD  PAST MEDICAL  HISTORY:  Past Medical History:  Diagnosis Date  . Abnormality of gait   . Depression   . DVT (deep venous thrombosis) (HCC)    Left leg  . Dyslipidemia   . Fibroids   . Hypertension   . Memory difficulties 12/10/2014  . Multiple sclerosis (HCC)    Right sided weakness and gait disorder, bladder and bowel problems   . Neurogenic bladder   . 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 Overdose of muscle relaxant    Unintentional baclofen overdose   . Trigeminal neuralgia   . Trigeminal neuralgia    Right  . Wound infection 10/12/2018    SOCIAL HX:  Social History   Tobacco Use  . Smoking status: Never Smoker  . Smokeless tobacco: Never Used  Substance Use Topics  . Alcohol use: No    ALLERGIES:  Allergies  Allergen Reactions  . Prednisone     Had gotten thrush  . Shellfish Allergy     Hives      PERTINENT MEDICATIONS:  Outpatient Encounter Medications as of 10/09/2019  Medication Sig  . acetaminophen (TYLENOL) 500 MG tablet Take 500 mg by mouth every 6 (six) hours as needed for mild pain.   Marland Kitchen amLODipine (NORVASC) 10 MG tablet Take 1 tablet (10 mg total) by mouth daily.  . APPLE CIDER VINEGAR PO Take 1 capsule by mouth daily. 450mg   . atorvastatin (LIPITOR) 40 MG tablet Take 40 mg by mouth daily.    . B Complex-C (B-COMPLEX WITH VITAMIN C) tablet Take 1 tablet by mouth daily.  . baclofen (LIORESAL) 10 MG tablet Take 1 tablet (10 mg total) by mouth 4 (four) times daily.  Marland Kitchen  buPROPion (WELLBUTRIN XL) 300 MG 24 hr tablet Take 300 mg by mouth daily.  . carbamazepine (TEGRETOL) 200 MG tablet TAKE 1 TABLET BY MOUTH 3 TIMES A DAY AND 1/2 TABLET AT BEDTIME AT BEDTIME  . Cholecalciferol (CVS VIT D 5000 HIGH-POTENCY PO) Take 5,000 Int'l Units/L by mouth daily.  Marland Kitchen CRANBERRY PO Take by mouth.  . Diaper Rash Products (DESITIN CLEAR EX) Apply topically.  . docusate sodium (COLACE) 100 MG capsule Take 1 capsule (100 mg total) by mouth 2 (two) times daily.  Marland Kitchen donepezil (ARICEPT) 5 MG  tablet TAKE 1 TABLET BY MOUTH EVERY DAY  . FIBER SELECT GUMMIES PO Take 5 g by mouth 2 (two) times daily.   . fish oil-omega-3 fatty acids 1000 MG capsule Take 2 g by mouth 3 (three) times daily.  . hydrALAZINE (APRESOLINE) 10 MG tablet   . hydrochlorothiazide (HYDRODIURIL) 25 MG tablet TAKE 1 TABLET BY MOUTH EVERY DAY IN THE MORNING  . Interferon Beta-1a (AVONEX PEN) 30 MCG/0.5ML AJKT Inject 30 mcg into the muscle every 7 (seven) days.  Marland Kitchen labetalol (NORMODYNE) 100 MG tablet Take 100 mg by mouth 2 (two) times daily.  . Multiple Vitamins-Minerals (MULTIVITAMINS THER. W/MINERALS) TABS Take 1 tablet by mouth daily.    . Potassium Chloride (KLOR-CON PO) Take 20 mEq by mouth 3 (three) times daily.   . pregabalin (LYRICA) 100 MG capsule Take 1 capsule (100 mg total) by mouth 2 (two) times daily.  . pregabalin (LYRICA) 50 MG capsule TAKE 1 CAPSULE TWICE A DAY ALONG WITH HER 100 MG DOSE TO TOTAL 150 MG TWICE DAILY  . Probiotic Product (PROBIOTIC PO) Take by mouth.  . sertraline (ZOLOFT) 50 MG tablet Take 75 mg by mouth daily. One and one half tablet once a day in morning.   . Skin Protectants, Misc. (WHITE PETROLATUM-ZINC OXIDE) cream Apply topically as needed. (Patient taking differently: Apply 1 application topically daily as needed for dry skin or wound care. )  . tolterodine (DETROL LA) 2 MG 24 hr capsule Take 1 capsule (2 mg total) by mouth at bedtime.  . traMADol (ULTRAM) 50 MG tablet TAKE 1 TABLET (50 MG TOTAL) BY MOUTH EVERY 6 (SIX) HOURS AS NEEDED FOR MODERATE PAIN.  . TURMERIC PO Take 450 mg by mouth.   No facility-administered encounter medications on file as of 10/09/2019.   Teodoro Spray, NP

## 2019-11-03 DIAGNOSIS — L89309 Pressure ulcer of unspecified buttock, unspecified stage: Secondary | ICD-10-CM | POA: Diagnosis not present

## 2019-11-03 DIAGNOSIS — R32 Unspecified urinary incontinence: Secondary | ICD-10-CM | POA: Diagnosis not present

## 2019-11-03 DIAGNOSIS — G35 Multiple sclerosis: Secondary | ICD-10-CM | POA: Diagnosis not present

## 2019-11-03 DIAGNOSIS — L8992 Pressure ulcer of unspecified site, stage 2: Secondary | ICD-10-CM | POA: Diagnosis not present

## 2019-11-04 DIAGNOSIS — F325 Major depressive disorder, single episode, in full remission: Secondary | ICD-10-CM | POA: Diagnosis not present

## 2019-11-04 DIAGNOSIS — F0391 Unspecified dementia with behavioral disturbance: Secondary | ICD-10-CM | POA: Diagnosis not present

## 2019-11-04 DIAGNOSIS — Z89511 Acquired absence of right leg below knee: Secondary | ICD-10-CM | POA: Diagnosis not present

## 2019-11-04 DIAGNOSIS — I10 Essential (primary) hypertension: Secondary | ICD-10-CM | POA: Diagnosis not present

## 2019-11-04 DIAGNOSIS — G35 Multiple sclerosis: Secondary | ICD-10-CM | POA: Diagnosis not present

## 2019-11-06 ENCOUNTER — Telehealth: Payer: Self-pay | Admitting: *Deleted

## 2019-11-06 NOTE — Telephone Encounter (Signed)
Received fax from [facility] to complete PA for Avonex. When information entered CMM stated:  Authorization already on file for this request.

## 2019-11-11 ENCOUNTER — Other Ambulatory Visit: Payer: Self-pay

## 2019-11-11 ENCOUNTER — Other Ambulatory Visit: Payer: Medicare HMO | Admitting: Hospice

## 2019-11-11 DIAGNOSIS — G35 Multiple sclerosis: Secondary | ICD-10-CM

## 2019-11-11 DIAGNOSIS — Z515 Encounter for palliative care: Secondary | ICD-10-CM

## 2019-11-11 NOTE — Progress Notes (Signed)
[facility] Consult Note Telephone: [phone]  Fax: [phone]  PATIENT NAME: [name] DOB: 1946/05/31 MRN: [id number]  PRIMARY CARE PROVIDER:   Dr Eulah Pont REFERRING PROVIDER:  Maurice Small MD RESPONSIBLE PARTY:  Ryla Stovall, son [phone]  TELEHEALTH VISIT STATEMENT Due to the COVID-19 crisis, this visit was done via telephone from my office. It was initiated and consented to by this patient and/or family.  RECOMMENDATIONS/PLAN:  Advance Care Planning/Goals of Care:  Patient remains a full code;  goals of care include to maximize quality of life and symptom management.  Symptom management: Neuropatic pain related to Trigeminal Neuralgia and MS well managed with Lyrica. Patient was initially on 300mg  daily but reduced to an effective dose of 100mg  BID. Patient denied pain/discomfort. [name] confirmed patient's pain is now controlled; no trigeminal neuralgia flare. Also sacral wound resolved/closed up and [place] stopped coming . Continues to apply Desitin cream protectant BID and to use air mattress to relieve pressure and help prevent pressure wounds. CNAs from [place] help provide care 5 days a week and [name] does same during the weekends. Patient is bedbound; max assist to wheelcahair. She appointment with PCP end of this month for check p and blood work.  Follow [id number] care will continue to follow patient for goals of care clarification and symptom management. I spent71minutes providing this consultation.More than 50% of the time in this consultation was spent on coordinating communication [place] a 74 y.o.year oldfemalewith multiple medical problems including MS, trigeminal neuralgia right side, HTN, R BKA. Palliative Care was asked to help address goals of care.    CODE STATUS: Full  PPS: 30% HOSPICE ELIGIBILITY/DIAGNOSIS: TBD  PAST  MEDICAL HISTORY:  Past Medical History:  Diagnosis Date  . Abnormality of gait   . Depression   . DVT (deep venous thrombosis) (HCC)    Left leg  . Dyslipidemia   . Fibroids   . Hypertension   . Memory difficulties 12/10/2014  . Multiple sclerosis (HCC)    Right sided weakness and gait disorder, bladder and bowel problems   . Neurogenic bladder   . 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 Overdose of muscle relaxant    Unintentional baclofen overdose   . Trigeminal neuralgia   . Trigeminal neuralgia    Right  . Wound infection 10/12/2018    SOCIAL HX:  Social History   Tobacco Use  . Smoking status: Never Smoker  . Smokeless tobacco: Never Used  Substance Use Topics  . Alcohol use: No    ALLERGIES:  Allergies  Allergen Reactions  . Prednisone     Had gotten thrush  . Shellfish Allergy     Hives      PERTINENT MEDICATIONS:  Outpatient Encounter Medications as of 11/11/2019  Medication Sig  . acetaminophen (TYLENOL) 500 MG tablet Take 500 mg by mouth every 6 (six) hours as needed for mild pain.   Marland Kitchen amLODipine (NORVASC) 10 MG tablet Take 1 tablet (10 mg total) by mouth daily.  . APPLE CIDER VINEGAR PO Take 1 capsule by mouth daily. 450mg   . atorvastatin (LIPITOR) 40 MG tablet Take 40 mg by mouth daily.    . B Complex-C (B-COMPLEX WITH VITAMIN C) tablet Take 1 tablet by mouth daily.  . baclofen (LIORESAL) 10 MG tablet Take 1 tablet (10 mg total) by mouth 4 (four) times daily.  Marland Kitchen buPROPion (WELLBUTRIN XL) 300 MG  24 hr tablet Take 300 mg by mouth daily.  . carbamazepine (TEGRETOL) 200 MG tablet TAKE 1 TABLET BY MOUTH 3 TIMES A DAY AND 1/2 TABLET AT BEDTIME AT BEDTIME  . Cholecalciferol (CVS VIT D 5000 HIGH-POTENCY PO) Take 5,000 Int'l Units/L by mouth daily.  Marland Kitchen CRANBERRY PO Take by mouth.  . Diaper Rash Products (DESITIN CLEAR EX) Apply topically.  . docusate sodium (COLACE) 100 MG capsule Take 1 capsule (100 mg total) by mouth 2 (two) times daily.  Marland Kitchen donepezil (ARICEPT)  5 MG tablet TAKE 1 TABLET BY MOUTH EVERY DAY  . FIBER SELECT GUMMIES PO Take 5 g by mouth 2 (two) times daily.   . fish oil-omega-3 fatty acids 1000 MG capsule Take 2 g by mouth 3 (three) times daily.  . hydrALAZINE (APRESOLINE) 10 MG tablet   . hydrochlorothiazide (HYDRODIURIL) 25 MG tablet TAKE 1 TABLET BY MOUTH EVERY DAY IN THE MORNING  . Interferon Beta-1a (AVONEX PEN) 30 MCG/0.5ML AJKT Inject 30 mcg into the muscle every 7 (seven) days.  Marland Kitchen labetalol (NORMODYNE) 100 MG tablet Take 100 mg by mouth 2 (two) times daily.  . Multiple Vitamins-Minerals (MULTIVITAMINS THER. W/MINERALS) TABS Take 1 tablet by mouth daily.    . Potassium Chloride (KLOR-CON PO) Take 20 mEq by mouth 3 (three) times daily.   . pregabalin (LYRICA) 100 MG capsule Take 1 capsule (100 mg total) by mouth 2 (two) times daily.  . pregabalin (LYRICA) 50 MG capsule TAKE 1 CAPSULE TWICE A DAY ALONG WITH HER 100 MG DOSE TO TOTAL 150 MG TWICE DAILY  . Probiotic Product (PROBIOTIC PO) Take by mouth.  . sertraline (ZOLOFT) 50 MG tablet Take 75 mg by mouth daily. One and one half tablet once a day in morning.   . Skin Protectants, Misc. (WHITE PETROLATUM-ZINC OXIDE) cream Apply topically as needed. (Patient taking differently: Apply 1 application topically daily as needed for dry skin or wound care. )  . tolterodine (DETROL LA) 2 MG 24 hr capsule Take 1 capsule (2 mg total) by mouth at bedtime.  . traMADol (ULTRAM) 50 MG tablet TAKE 1 TABLET (50 MG TOTAL) BY MOUTH EVERY 6 (SIX) HOURS AS NEEDED FOR MODERATE PAIN.  . TURMERIC PO Take 450 mg by mouth.   No facility-administered encounter medications on file as of 11/11/2019.    Teodoro Spray, NP

## 2019-12-04 ENCOUNTER — Telehealth: Payer: Self-pay | Admitting: Neurology

## 2019-12-04 DIAGNOSIS — G35 Multiple sclerosis: Secondary | ICD-10-CM | POA: Diagnosis not present

## 2019-12-04 DIAGNOSIS — L8992 Pressure ulcer of unspecified site, stage 2: Secondary | ICD-10-CM | POA: Diagnosis not present

## 2019-12-04 DIAGNOSIS — R32 Unspecified urinary incontinence: Secondary | ICD-10-CM | POA: Diagnosis not present

## 2019-12-04 DIAGNOSIS — L89309 Pressure ulcer of unspecified buttock, unspecified stage: Secondary | ICD-10-CM | POA: Diagnosis not present

## 2019-12-04 MED ORDER — AVONEX PEN 30 MCG/0.5ML IM AJKT: 30.0000 ug | AUTO-INJECTOR | INTRAMUSCULAR | 1 refills | Status: DC

## 2019-12-04 NOTE — Telephone Encounter (Signed)
Pt has appt in December 10, 2019, prescribed avonex for 6 months to [name].

## 2019-12-04 NOTE — Telephone Encounter (Signed)
Patient son doug called to provide patient assistance number states they are needing a prescription faxed over in order to fill   [facility] Fax [phone]

## 2019-12-05 DIAGNOSIS — G35 Multiple sclerosis: Secondary | ICD-10-CM | POA: Diagnosis not present

## 2019-12-05 DIAGNOSIS — F0391 Unspecified dementia with behavioral disturbance: Secondary | ICD-10-CM | POA: Diagnosis not present

## 2019-12-05 DIAGNOSIS — K429 Umbilical hernia without obstruction or gangrene: Secondary | ICD-10-CM | POA: Diagnosis not present

## 2019-12-05 DIAGNOSIS — F325 Major depressive disorder, single episode, in full remission: Secondary | ICD-10-CM | POA: Diagnosis not present

## 2019-12-05 DIAGNOSIS — I10 Essential (primary) hypertension: Secondary | ICD-10-CM | POA: Diagnosis not present

## 2019-12-05 DIAGNOSIS — Z89511 Acquired absence of right leg below knee: Secondary | ICD-10-CM | POA: Diagnosis not present

## 2019-12-05 DIAGNOSIS — K5901 Slow transit constipation: Secondary | ICD-10-CM | POA: Diagnosis not present

## 2019-12-06 ENCOUNTER — Other Ambulatory Visit: Payer: Self-pay | Admitting: Neurology

## 2019-12-10 ENCOUNTER — Encounter: Payer: Self-pay | Admitting: Neurology

## 2019-12-10 ENCOUNTER — Other Ambulatory Visit: Payer: Self-pay

## 2019-12-10 ENCOUNTER — Ambulatory Visit: Payer: Medicare HMO | Admitting: Neurology

## 2019-12-10 VITALS — BP 142/67 | HR 62 | Temp 97.2°F | Ht 65.0 in

## 2019-12-10 DIAGNOSIS — G500 Trigeminal neuralgia: Secondary | ICD-10-CM | POA: Diagnosis not present

## 2019-12-10 DIAGNOSIS — R413 Other amnesia: Secondary | ICD-10-CM | POA: Diagnosis not present

## 2019-12-10 DIAGNOSIS — G35 Multiple sclerosis: Secondary | ICD-10-CM | POA: Diagnosis not present

## 2019-12-10 MED ORDER — CARBAMAZEPINE 200 MG PO TABS: ORAL_TABLET | ORAL | 1 refills | Status: DC

## 2019-12-10 NOTE — Patient Instructions (Signed)
Continue current medications  See you back in 6 months, check blood work then

## 2019-12-10 NOTE — Progress Notes (Signed)
PATIENT: [name] DOB: [birth year]  REASON FOR VISIT: follow up HISTORY FROM: patient  HISTORY OF PRESENT ILLNESS: Today 12/10/19  [name] is a 74 year old female with history of multiple sclerosis on Avonex.  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  She has history of trigeminal neuralgia, that may flare occasionally.  She is taking Lyrica and carbamazepine.  She will take tramadol for breakthrough flares.  Her last flare occurred in November, she has been doing well.  She stopped the Detrol.  Her sacral ulcer has healed.  She has a prosthetic for a right BKA.  She did not do physical therapy yet.  She has reduced her dose of Lyrica back to 100 mg twice daily.  Her mental status has been good, memory is stable, takes Aricept.  She has a home health aide who comes 10 hours a week.  She denies any new problems or concerns.  She presents today accompanied by her son.  HISTORY  06/04/2019 SS: [name] is a 74 year old female with history of multiple sclerosis on Avonex.  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  She has history of trigeminal neuralgia that may flare on occasion.  She is treated with Lyrica and carbamazepine.  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  She has a prosthetic limb.  At last visit her dose of Lyrica was reduced to 100 mg twice a day due to the report of her being "zoned out". She is more alert with the dose reduction. She had a trigeminal neuralgia flare on the right side last week. She got a refill on Tramadol to get through the flare. It has since calmed down. She lives with her son. Her memory is stable. She may forget that she doesn't live in [place] any more. She has a home health aid that comes M-F for 2 hours. She needs assistance with ADLs. She has a sacral ulcer that is healing.  She wears adult briefs and is incontinent of urine. She also has a nurse coming weekly to check her ulcer. She was never set up for physical therapy for the BKA due to [place]. Is not able to weight bear at this point. Her appetite is fair. She does seem to drool more often.  She presents today for follow-up in a wheelchair, accompanied by her son.  REVIEW OF SYSTEMS: Out of a complete 14 system review of symptoms, the patient complains only of the following symptoms, and all other reviewed systems are negative.  Memory loss, gait abnormality  ALLERGIES: Allergies  Allergen Reactions  . Prednisone     Had gotten thrush  . Shellfish Allergy     Hives     HOME MEDICATIONS: Outpatient Medications Prior to Visit  Medication Sig Dispense Refill  . acetaminophen (TYLENOL) 500 MG tablet Take 500 mg by mouth every 6 (six) hours as needed for mild pain.     Marland Kitchen amLODipine (NORVASC) 10 MG tablet Take 1 tablet (10 mg total) by mouth daily. 30 tablet 3  . APPLE CIDER VINEGAR PO Take 1 capsule by mouth daily. 450mg     . atorvastatin (LIPITOR) 40 MG tablet Take 40 mg by mouth daily.      . B Complex-C (B-COMPLEX WITH VITAMIN C) tablet Take 1 tablet by mouth daily.    . baclofen (LIORESAL)  10 MG tablet TAKE 1 TABLET (10 MG TOTAL) BY MOUTH 4 (FOUR) TIMES DAILY. 360 tablet 1  . buPROPion (WELLBUTRIN XL) 300 MG 24 hr tablet Take 300 mg by mouth daily.    . carbamazepine (TEGRETOL) 200 MG tablet TAKE 1 TABLET BY MOUTH 3 TIMES A DAY AND 1/2 TABLET AT BEDTIME AT BEDTIME 315 tablet 1  . Cholecalciferol (CVS VIT D 5000 HIGH-POTENCY PO) Take 5,000 Int'l Units/L by mouth daily.    Marland Kitchen CRANBERRY PO Take by mouth.    . Diaper Rash Products (DESITIN CLEAR EX) Apply topically.    . docusate sodium (COLACE) 100 MG capsule Take 1 capsule (100 mg total) by mouth 2 (two) times daily. 10 capsule 0  . donepezil (ARICEPT) 5 MG tablet TAKE 1 TABLET BY MOUTH EVERY DAY 90 tablet 4  . FIBER SELECT GUMMIES PO Take 5 g by mouth 2  (two) times daily.     . fish oil-omega-3 fatty acids 1000 MG capsule Take 2 g by mouth 3 (three) times daily.    . hydrochlorothiazide (HYDRODIURIL) 25 MG tablet TAKE 1 TABLET BY MOUTH EVERY DAY IN THE MORNING    . Interferon Beta-1a (AVONEX PEN) 30 MCG/0.5ML AJKT Inject 30 mcg into the muscle every 7 (seven) days. 12 each 1  . labetalol (NORMODYNE) 100 MG tablet Take 100 mg by mouth 2 (two) times daily.    . Multiple Vitamins-Minerals (MULTIVITAMINS THER. W/MINERALS) TABS Take 1 tablet by mouth daily.      . Potassium Chloride (KLOR-CON PO) Take 20 mEq by mouth 3 (three) times daily.     . Probiotic Product (PROBIOTIC PO) Take by mouth.    . sertraline (ZOLOFT) 50 MG tablet Take 75 mg by mouth daily. One and one half tablet once a day in morning.     . Skin Protectants, Misc. (WHITE PETROLATUM-ZINC OXIDE) cream Apply topically as needed. (Patient taking differently: Apply 1 application topically daily as needed for dry skin or wound care. ) 98 g 0  . traMADol (ULTRAM) 50 MG tablet TAKE 1 TABLET (50 MG TOTAL) BY MOUTH EVERY 6 (SIX) HOURS AS NEEDED FOR MODERATE PAIN. 30 tablet 1  . TURMERIC PO Take 450 mg by mouth.    . hydrALAZINE (APRESOLINE) 10 MG tablet     . pregabalin (LYRICA) 100 MG capsule Take 1 capsule (100 mg total) by mouth 2 (two) times daily. 60 capsule 5  . pregabalin (LYRICA) 50 MG capsule TAKE 1 CAPSULE TWICE A DAY ALONG WITH HER 100 MG DOSE TO TOTAL 150 MG TWICE DAILY 60 capsule 0  . tolterodine (DETROL LA) 2 MG 24 hr capsule Take 1 capsule (2 mg total) by mouth at bedtime. 90 capsule 1   No facility-administered medications prior to visit.    PAST MEDICAL HISTORY: Past Medical History:  Diagnosis Date  . Abnormality of gait   . Depression   . DVT (deep venous thrombosis) (HCC)    Left leg  . Dyslipidemia   . Fibroids   . Hypertension   . Memory difficulties 12/10/2014  . Multiple sclerosis (HCC)    Right sided weakness and gait disorder, bladder and bowel problems     . Neurogenic bladder   . 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 Overdose of muscle relaxant    Unintentional baclofen overdose   . Trigeminal neuralgia   . Trigeminal neuralgia    Right  . Wound infection 10/12/2018    PAST SURGICAL HISTORY:  Past Surgical History:  Procedure Laterality Date  . AMPUTATION Right 10/17/2018   Procedure: RIGHT BELOW KNEE AMPUTATION;  Surgeon: [name], MD;  Location: [facility];  Service: Orthopedics;  Laterality: Right;  . gamma knife procedure for rt. trigeminal neuralgia Right   . left leg DVT Left   . STRABISMUS SURGERY     Right eye     FAMILY HISTORY: Family History  Problem Relation Age of Onset  . Multiple sclerosis Sister   . Cervical cancer Mother   . Liver disease Father   . Hyperlipidemia Brother   . Pancreatitis Brother   . Hypertension Other   . Heart disease Other   . Uterine cancer Other     SOCIAL HISTORY: Social History   Socioeconomic History  . Marital status: Divorced    Spouse name: Not on file  . Number of children: 2  . Years of education: 68  . Highest education level: Not on file  Occupational History  . Occupation: disabled Education officer, museum  Tobacco Use  . Smoking status: Never Smoker  . Smokeless tobacco: Never Used  Substance and Sexual Activity  . Alcohol use: No  . Drug use: No  . Sexual activity: Not Currently    Birth control/protection: Post-menopausal  Other Topics Concern  . Not on file  Social History Narrative   Divorced, has a son and daughter. [name] is very involved in her care. [phone] and [phone]   Living with her son, [name] as FT caregiver.  02-09-15 ssy      Patient drinks about 2 sodas daily.   Patient is right handed.    Social Determinants of Health   Financial Resource Strain:   . Difficulty of Paying Living Expenses: Not on file  Food Insecurity:   . Worried About Charity fundraiser in the Last Year: Not on file  . Ran Out of Food in the Last Year: Not on file   Transportation Needs:   . Lack of Transportation (Medical): Not on file  . Lack of Transportation (Non-Medical): Not on file  Physical Activity:   . Days of Exercise per Week: Not on file  . Minutes of Exercise per Session: Not on file  Stress:   . Feeling of Stress : Not on file  Social Connections:   . Frequency of Communication with Friends and Family: Not on file  . Frequency of Social Gatherings with Friends and Family: Not on file  . Attends Religious Services: Not on file  . Active Member of Clubs or Organizations: Not on file  . Attends Archivist Meetings: Not on file  . Marital Status: Not on file  Intimate Partner Violence:   . Fear of Current or Ex-Partner: Not on file  . Emotionally Abused: Not on file  . Physically Abused: Not on file  . Sexually Abused: Not on file      PHYSICAL EXAM  Vitals:   12/10/19 1546  BP: (!) 142/67  Pulse: 62  Temp: (!) 97.2 F (36.2 C)  Height: 5\' 5"  (1.651 m)   Body mass index is 31.62 kg/m.  Generalized: Well developed, in no acute distress  MMSE - Mini Mental State Exam 06/04/2019 07/09/2018 12/26/2017  Orientation to time 1 2 0  Orientation to Place 3 5 3   Registration 3 2 3   Attention/ Calculation 0 0 3  Recall 3 1 2   Language- name 2 objects 2 2 2   Language- repeat 1 0 1  Language-  follow 3 step command 3 3 3   Language- read & follow direction 1 1 1   Write a sentence 0 0 0  Copy design 0 0 0  Total score 17 16 18     Neurological examination  Mentation: Alert oriented, history is provided by son.  Follows all commands speech and language fluent Cranial nerve II-XII: Pupils were equal round reactive to light. Extraocular movements were full, visual field were full on confrontational test. Facial sensation and strength were normal. Head turning and shoulder shrug were normal and symmetric. Motor: Grader weakness in the right arm than the left, right BKA, RUE 3/5, very limited mobility of bilateral lower  extremities Sensory: Sensory testing is intact to soft touch on all 4 extremities. No evidence of extinction is noted.  Coordination: Slight ataxia with right finger-nose-finger, cannot perform heel-to-shin Gait and station: In a wheelchair, nonambulatory Reflexes: Deep tendon reflexes are symmetric   DIAGNOSTIC DATA (LABS, IMAGING, TESTING) - I reviewed patient records, labs, notes, testing and imaging myself where available.  Lab Results  Component Value Date   WBC 4.5 06/04/2019   HGB 11.2 06/04/2019   HCT 33.8 (L) 06/04/2019   MCV 93 06/04/2019   PLT 202 06/04/2019      Component Value Date/Time   NA 142 06/04/2019 1549   K 4.0 06/04/2019 1549   CL 104 06/04/2019 1549   CO2 22 06/04/2019 1549   GLUCOSE 106 (H) 06/04/2019 1549   GLUCOSE 91 10/24/2018 0302   BUN 21 06/04/2019 1549   CREATININE 0.60 06/04/2019 1549   CALCIUM 9.6 06/04/2019 1549   PROT 6.6 06/04/2019 1549   ALBUMIN 3.9 06/04/2019 1549   AST 22 06/04/2019 1549   ALT 16 06/04/2019 1549   ALKPHOS 115 06/04/2019 1549   BILITOT <0.2 06/04/2019 1549   GFRNONAA 93 06/04/2019 1549   GFRAA 107 06/04/2019 1549   No results found for: CHOL, HDL, LDLCALC, LDLDIRECT, TRIG, CHOLHDL No results found for: HGBA1C Lab Results  Component Value Date   VITAMINB12 1,624 (H) 10/12/2018   No results found for: TSH  ASSESSMENT AND PLAN 74 y.o. year old female  has a past medical history of Abnormality of gait, Depression, DVT (deep venous thrombosis) ([place]), Dyslipidemia, Fibroids, Hypertension, Memory difficulties (12/10/2014), Multiple sclerosis ([place]), Neurogenic bladder, Organic brain syndrome, Overdose of muscle relaxant, Trigeminal neuralgia, Trigeminal neuralgia, and Wound infection (10/12/2018). here with:  1.  Multiple sclerosis -Chronic right-sided weakness in her arm, generalized weakness, nonambulatory -Continue Avonex injections -Continue baclofen 10 mg tablet, 1 tablet 4 times a day -Detrol has been  discontinued  2.  Dementia -Memory score has been stable -Continue Aricept 5 mg at bedtime  3.  Gait disorder -She is in a wheelchair, nonambulatory -Sacral ulcer has healed -Has right BKA amputation, now has prosthetic  4.  Trigeminal neuralgia -Had a flare in November, increased Lyrica 150 mg twice a day, used tramadol for breakthrough pain, flare subsided -Continue Lyrica 100 mg twice a day, higher doses, felt zoned out -Continue tramadol as needed for breakthrough pain -Continue carbamazepine 200 mg, 1 tablet 3 times a day, 1/2 tablet at bedtime -Check lab work at next visit, in August carbamazepine level was 10.9, normal liver function -Follow-up in 6 months or sooner if needed  I spent 25 minutes with the patient. 50% of this time was spent discussing her plan of care.  Butler Denmark, AGNP-C, DNP 12/10/2019, 3:53 PM [facility] [address], [place], [place] [address] [phone]

## 2019-12-10 NOTE — Progress Notes (Signed)
I have read the note, and I agree with the clinical assessment and plan.  Halayna Blane K Jaxsyn Azam   

## 2020-01-01 DIAGNOSIS — R32 Unspecified urinary incontinence: Secondary | ICD-10-CM | POA: Diagnosis not present

## 2020-01-01 DIAGNOSIS — L89309 Pressure ulcer of unspecified buttock, unspecified stage: Secondary | ICD-10-CM | POA: Diagnosis not present

## 2020-01-01 DIAGNOSIS — G35 Multiple sclerosis: Secondary | ICD-10-CM | POA: Diagnosis not present

## 2020-01-01 DIAGNOSIS — L8992 Pressure ulcer of unspecified site, stage 2: Secondary | ICD-10-CM | POA: Diagnosis not present

## 2020-01-04 DIAGNOSIS — H2513 Age-related nuclear cataract, bilateral: Secondary | ICD-10-CM | POA: Diagnosis not present

## 2020-01-04 DIAGNOSIS — H40033 Anatomical narrow angle, bilateral: Secondary | ICD-10-CM | POA: Diagnosis not present

## 2020-01-07 DIAGNOSIS — N6002 Solitary cyst of left breast: Secondary | ICD-10-CM | POA: Diagnosis not present

## 2020-01-07 DIAGNOSIS — R928 Other abnormal and inconclusive findings on diagnostic imaging of breast: Secondary | ICD-10-CM | POA: Diagnosis not present

## 2020-01-07 DIAGNOSIS — Z0100 Encounter for examination of eyes and vision without abnormal findings: Secondary | ICD-10-CM | POA: Diagnosis not present

## 2020-02-03 ENCOUNTER — Other Ambulatory Visit: Payer: Self-pay

## 2020-02-03 ENCOUNTER — Other Ambulatory Visit: Payer: Medicare HMO | Admitting: Hospice

## 2020-02-03 DIAGNOSIS — G35 Multiple sclerosis: Secondary | ICD-10-CM

## 2020-02-03 DIAGNOSIS — Z515 Encounter for palliative care: Secondary | ICD-10-CM | POA: Diagnosis not present

## 2020-02-03 IMAGING — MR MR ANKLE*R* WO/W CM
7 of 8 series · 37 of 40 positions shown · IV contrast (Gadavist)
Comparison: Radiographs 10/11/2018.

CLINICAL DATA: Nonhealing lateral ankle wound following injury 2
weeks ago. Increasing lower extremity erythema.

EXAM:
MRI OF THE RIGHT ANKLE WITHOUT AND WITH CONTRAST
TECHNIQUE: Multiplanar, multisequence MR imaging of the ankle was performed
before and after the administration of intravenous contrast.
CONTRAST:  8 cc Gadavist

[Series 3: PD fat-sat · axial · right · 4.0mm · 0.31mm/px · z∈[-47,+132]mm · 6 of 36 slices shown]
[im 1/36]
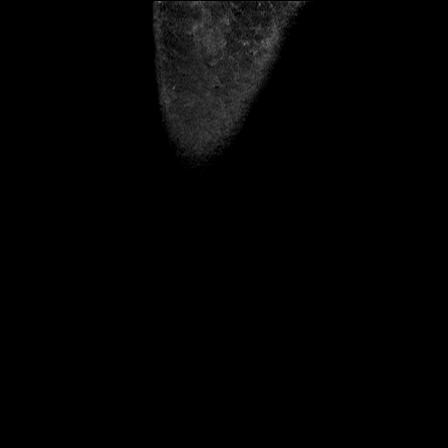
[im 8/36]
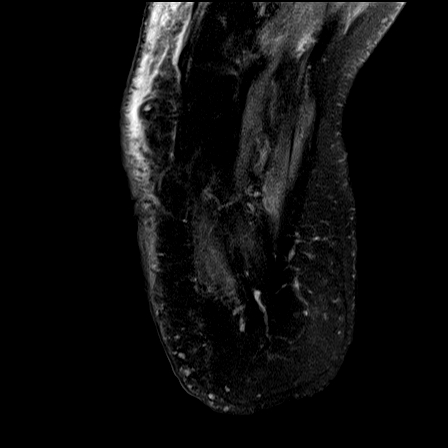
[im 15/36]
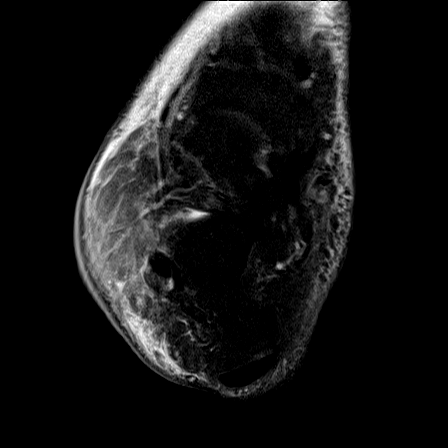
[im 22/36]
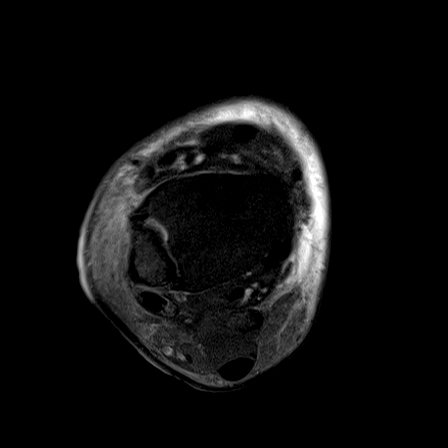
[im 29/36]
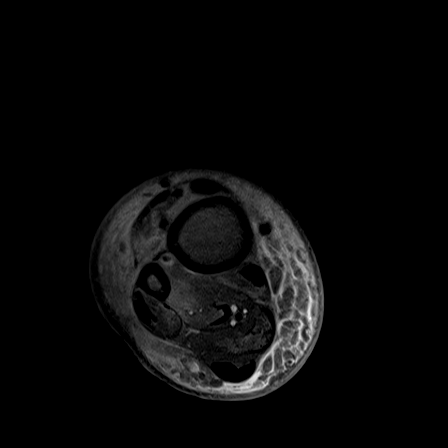
[im 36/36]
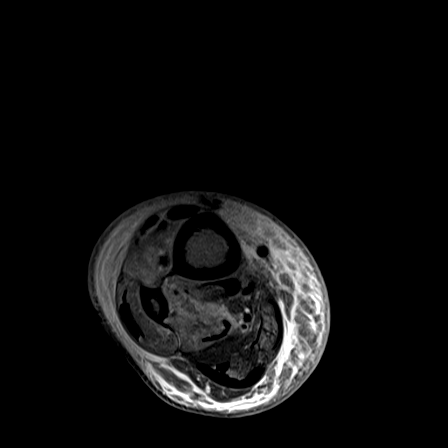

[Series 4: T2 fat-sat · axial · right · 4.0mm · 0.44mm/px · z∈[-46,+133]mm · 6 of 36 slices shown (1 of 2)]
[im 1/36]
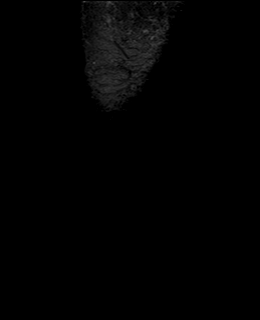
[im 8/36]
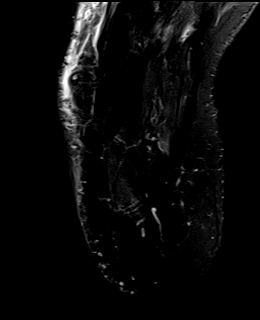
[im 15/36]
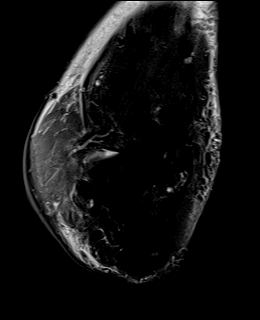
[im 22/36]
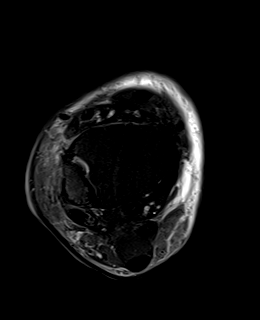
[im 29/36]
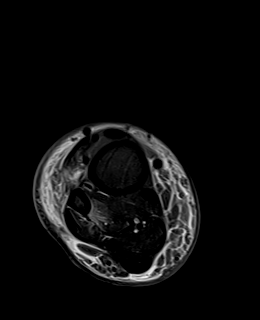
[im 36/36]
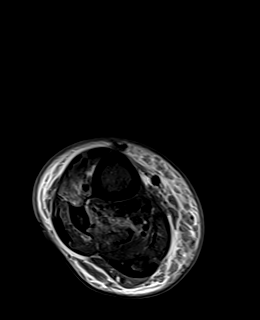

[Series 5: T2 fat-sat · coronal · right · 3.5mm · 0.45mm/px · 5 of 30 slices shown (2 of 2)]
[im 1/30]
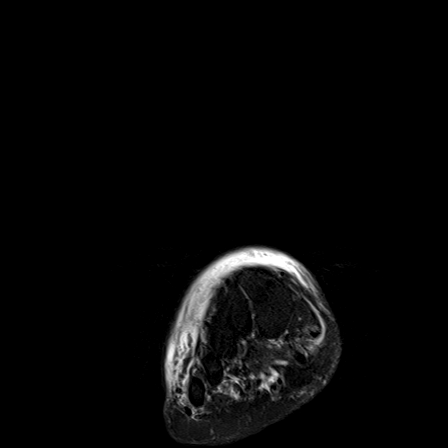
[im 8/30]
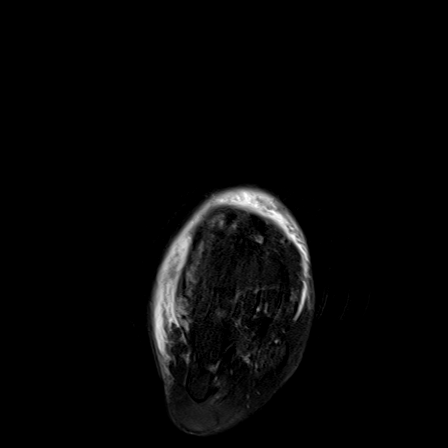
[im 15/30]
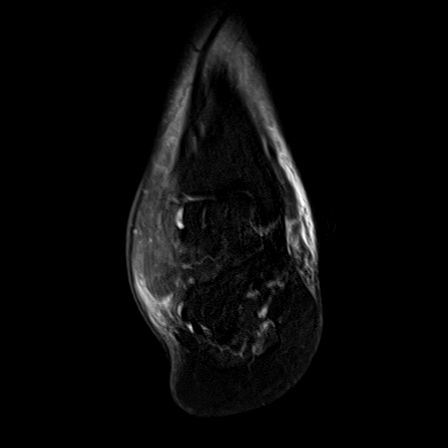
[im 22/30]
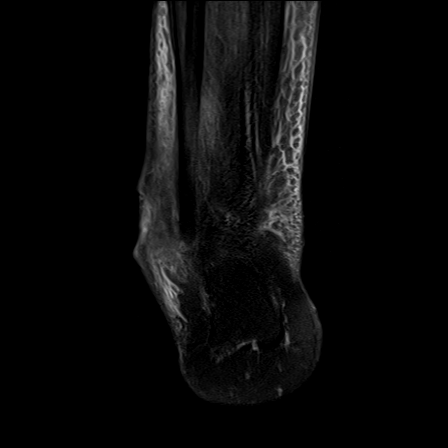
[im 30/30]
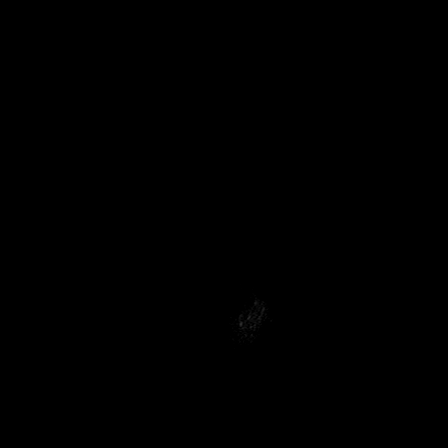

[Series 6: T1 · sagittal · right · 4.0mm · 0.52mm/px · 3 of 21 slices shown]
[im 1/21]
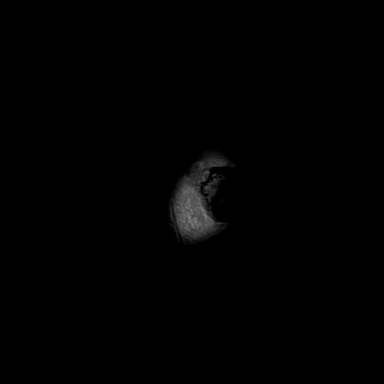
[im 11/21]
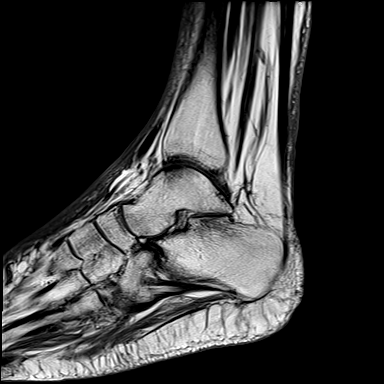
[im 21/21]
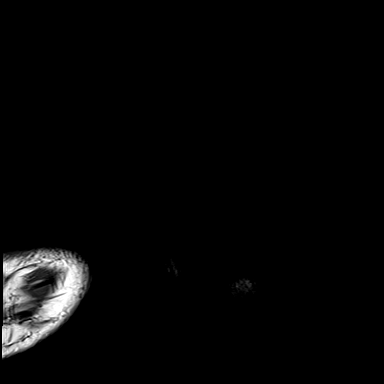

[Series 8: T1 fat-sat · axial · non-contrast · right · 4.0mm · 0.43mm/px · z∈[-47,+133]mm · 6 of 36 slices shown]
[im 1/36]
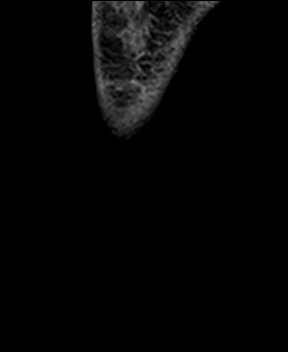
[im 8/36]
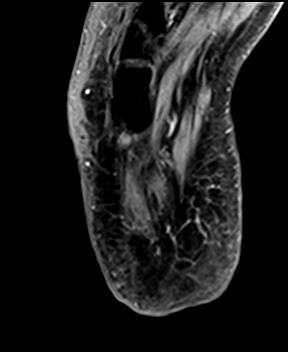
[im 15/36]
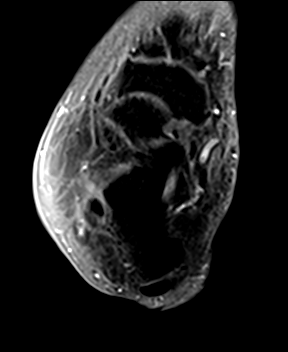
[im 22/36]
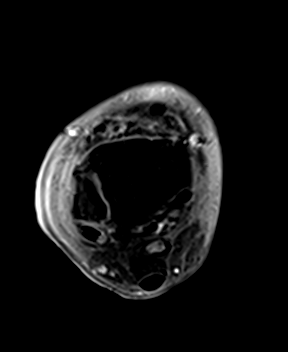
[im 29/36]
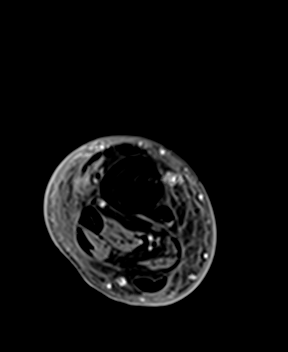
[im 36/36]
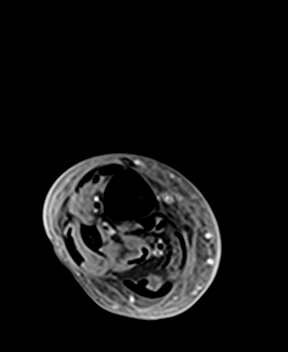

[Series 9: T1 fat-sat post-contrast · axial · right · 4.0mm · 0.43mm/px · z∈[-47,+133]mm · 6 of 36 slices shown (1 of 2)]
[im 1/36]
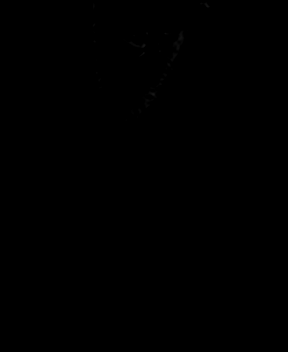
[im 8/36]
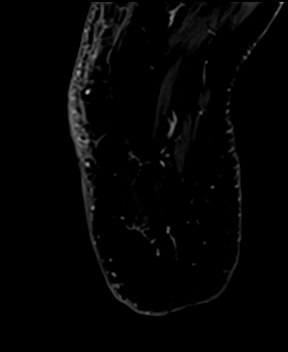
[im 15/36]
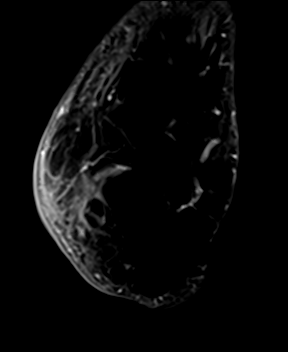
[im 22/36]
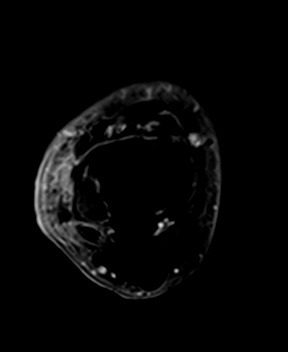
[im 29/36]
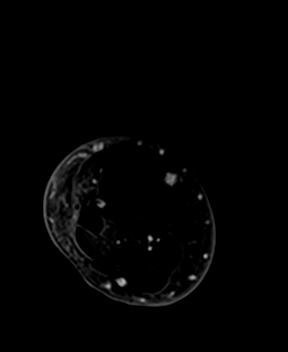
[im 36/36]
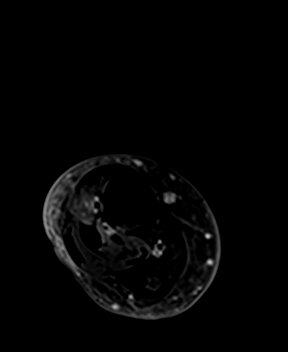

[Series 10: T1 fat-sat post-contrast · coronal · right · 3.5mm · 0.49mm/px · 5 of 30 slices shown (2 of 2)]
[im 1/30]
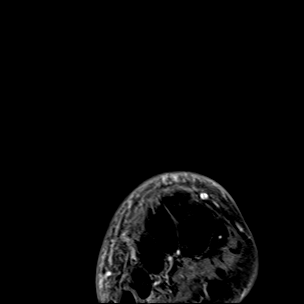
[im 8/30]
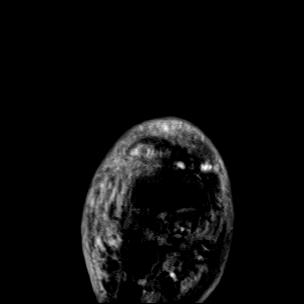
[im 15/30]
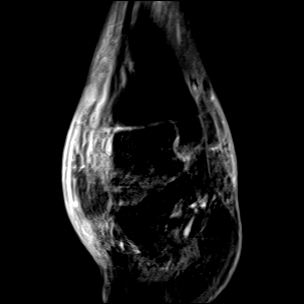
[im 22/30]
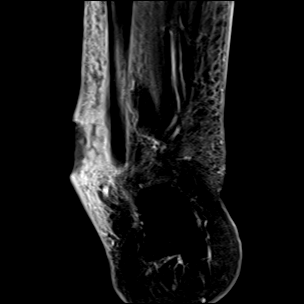
[im 30/30]
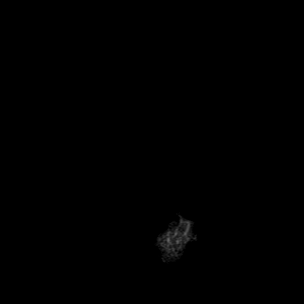

[37 of 40 positions shown; findings below may reference images not displayed]

FINDINGS: TENDONS

Peroneal: Intact and normally positioned. No significant
tenosynovitis or abnormal enhancement.

Posteromedial: Intact and normally positioned.

Anterior: Intact and normally positioned.

Achilles: Intact.

Plantar Fascia: Intact.

LIGAMENTS

Lateral: The anterior and posterior talofibular and calcaneofibular
ligaments are intact.

Medial: The deltoid and visualized portions of the spring ligament
appear intact.

CARTILAGE AND BONES

Ankle Joint: No significant ankle joint effusion or abnormal
synovial enhancement. Mild tibiotalar degenerative changes.

Subtalar Joints/Sinus Tarsi: Unremarkable.

Bones: There is a large skin defect lateral to the lateral malleolus
with an underlying large area of increased T2 signal and absent
enhancement in the subjacent soft tissues. The area of absent
enhancement measures 2.8 x 2.4 x 1.1 cm and abuts the lateral
malleolus. There is an underlying small subperiosteal abscess
measuring up to 17 mm on coronal image [DATE]. This is associated with
cortical destruction of the distal fibula laterally, underlying
marrow edema and enhancement. No other suspicious marrow lesions.

Other: In addition to the focal ulcer lateral to the lateral
malleolus, there is generalized subcutaneous edema in the lower leg,
extending into the dorsum of the foot. No other focal fluid
collections are identified.
IMPRESSION: IMPRESSION
1. Large soft tissue ulcer lateral to the lateral malleolus with
underlying soft tissue necrosis and a small subperiosteal abscess of
the distal fibula.
2. Mild osteomyelitis of the lateral malleolus. No evidence of
septic joint.
3. Generalized soft tissue edema consistent with cellulitis. No
focal fluid collections.
4. Intact ankle tendons and ligaments.
5. Preliminary results of this case were discussed between Dr. Joshjax
and Dr. Chambe at 0503 hours on 10/12/2018.

## 2020-02-03 NOTE — Progress Notes (Signed)
[facility] Consult Note Telephone: [phone]  Fax: [phone]  PATIENT NAME: [name] DOB: 30-Aug-1946 MRN: [id number]  PRIMARY CARE PROVIDER:   Glenis Smoker, MD REFERRING PROVIDER:  Maurice Small MD RESPONSIBLE PARTY:[name] Thatch, son [phone]  TELEHEALTH VISIT STATEMENT Due to the COVID-19 crisis, this visit was done via telephone from my office. It was initiated and consented to by this patient and/or family.  RECOMMENDATIONS/PLAN:  Advance Care Planning/Goals of Care: Patient remains a full code; son explained he wants everything done to save patient's life; he is willing to have in-person visit in the future for goals of care clarification. Goals of care include to maximize quality of life and symptom management. Symptom management:Patient denies pain/discomfort, no medical acuity. Chart review shows no falls or hospitalization since last visit. Son reports no changes in functional status since last visit and no changes to her medication. She continues on Lyrica for managing Neuropatic pain related to Trigeminal Neuralgia and MS. [name] confirmed patient's pain is now controlled; no trigeminal neuralgia flare. Also sacral wound resolved/closed. Patient continues on  Desitin cream protectant BID and air mattress in use to relieve pressure. [name] said he is making arrangement for the immunization of patient against COVID 19.  CNAs from [place] help provide care 5 days a week and [name] does same during the weekends. Patient is bedbound; max assist to wheelcahair. She has appointment with PCP by July '21. [name] and patient with no complaints or concerns. NP encouraged ongoing care Follow [id number] care will continue to follow patient for goals of care clarification and symptom management. I spent50 minutes providing this consultation; time includes chart review and documentation. More than 50% of the time  in this consultation was spent on coordinating communication [name] a 74 y.o.year oldfemalewith multiple medical problems including MS, trigeminal neuralgia right side, HTN, R BKA. Palliative Care was asked to help address goals of care. CODE STATUS: Full  PPS: 30% HOSPICE ELIGIBILITY/DIAGNOSIS: TBD  PAST MEDICAL HISTORY:  Past Medical History:  Diagnosis Date  . Abnormality of gait   . Depression   . DVT (deep venous thrombosis) (HCC)    Left leg  . Dyslipidemia   . Fibroids   . Hypertension   . Memory difficulties 12/10/2014  . Multiple sclerosis (HCC)    Right sided weakness and gait disorder, bladder and bowel problems   . Neurogenic bladder   . 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 Overdose of muscle relaxant    Unintentional baclofen overdose   . Trigeminal neuralgia   . Trigeminal neuralgia    Right  . Wound infection 10/12/2018    SOCIAL HX:  Social History   Tobacco Use  . Smoking status: Never Smoker  . Smokeless tobacco: Never Used  Substance Use Topics  . Alcohol use: No    ALLERGIES:  Allergies  Allergen Reactions  . Prednisone     Had gotten thrush  . Shellfish Allergy     Hives      PERTINENT MEDICATIONS:  Outpatient Encounter Medications as of 02/03/2020  Medication Sig  . acetaminophen (TYLENOL) 500 MG tablet Take 500 mg by mouth every 6 (six) hours as needed for mild pain.   Marland Kitchen amLODipine (NORVASC) 10 MG tablet Take 1 tablet (10 mg total) by mouth daily.  . APPLE CIDER VINEGAR PO Take 1 capsule by mouth daily. 450mg   . atorvastatin (LIPITOR) 40 MG  tablet Take 40 mg by mouth daily.    . B Complex-C (B-COMPLEX WITH VITAMIN C) tablet Take 1 tablet by mouth daily.  . baclofen (LIORESAL) 10 MG tablet TAKE 1 TABLET (10 MG TOTAL) BY MOUTH 4 (FOUR) TIMES DAILY.  Marland Kitchen buPROPion (WELLBUTRIN XL) 300 MG 24 hr tablet Take 300 mg by mouth daily.  . carbamazepine (TEGRETOL) 200 MG tablet TAKE 1 TABLET BY MOUTH 3  TIMES A DAY AND 1/2 TABLET AT BEDTIME AT BEDTIME  . Cholecalciferol (CVS VIT D 5000 HIGH-POTENCY PO) Take 5,000 Int'l Units/L by mouth daily.  Marland Kitchen CRANBERRY PO Take by mouth.  . Diaper Rash Products (DESITIN CLEAR EX) Apply topically.  . docusate sodium (COLACE) 100 MG capsule Take 1 capsule (100 mg total) by mouth 2 (two) times daily.  Marland Kitchen donepezil (ARICEPT) 5 MG tablet TAKE 1 TABLET BY MOUTH EVERY DAY  . FIBER SELECT GUMMIES PO Take 5 g by mouth 2 (two) times daily.   . fish oil-omega-3 fatty acids 1000 MG capsule Take 2 g by mouth 3 (three) times daily.  . hydrALAZINE (APRESOLINE) 10 MG tablet   . hydrochlorothiazide (HYDRODIURIL) 25 MG tablet TAKE 1 TABLET BY MOUTH EVERY DAY IN THE MORNING  . Interferon Beta-1a (AVONEX PEN) 30 MCG/0.5ML AJKT Inject 30 mcg into the muscle every 7 (seven) days.  Marland Kitchen labetalol (NORMODYNE) 100 MG tablet Take 100 mg by mouth 2 (two) times daily.  . Multiple Vitamins-Minerals (MULTIVITAMINS THER. W/MINERALS) TABS Take 1 tablet by mouth daily.    . Potassium Chloride (KLOR-CON PO) Take 20 mEq by mouth 3 (three) times daily.   . pregabalin (LYRICA) 100 MG capsule Take 1 capsule (100 mg total) by mouth 2 (two) times daily.  . Probiotic Product (PROBIOTIC PO) Take by mouth.  . sertraline (ZOLOFT) 50 MG tablet Take 75 mg by mouth daily. One and one half tablet once a day in morning.   . Skin Protectants, Misc. (WHITE PETROLATUM-ZINC OXIDE) cream Apply topically as needed. (Patient taking differently: Apply 1 application topically daily as needed for dry skin or wound care. )  . traMADol (ULTRAM) 50 MG tablet TAKE 1 TABLET (50 MG TOTAL) BY MOUTH EVERY 6 (SIX) HOURS AS NEEDED FOR MODERATE PAIN.  . TURMERIC PO Take 450 mg by mouth.   No facility-administered encounter medications on file as of 02/03/2020.    Teodoro Spray, NP

## 2020-02-05 ENCOUNTER — Other Ambulatory Visit: Payer: Self-pay | Admitting: Neurology

## 2020-03-16 ENCOUNTER — Other Ambulatory Visit: Payer: Medicare HMO | Admitting: Hospice

## 2020-03-16 ENCOUNTER — Other Ambulatory Visit: Payer: Self-pay

## 2020-03-16 DIAGNOSIS — G35 Multiple sclerosis: Secondary | ICD-10-CM

## 2020-03-16 DIAGNOSIS — Z515 Encounter for palliative care: Secondary | ICD-10-CM | POA: Diagnosis not present

## 2020-03-16 NOTE — Progress Notes (Signed)
[facility] Consult Note Telephone: [phone]  Fax: [phone]  PATIENT NAME: [name] DOB: [birth year] MRN: [id number]  PRIMARY CARE PROVIDER:   Glenis Smoker, MD [place] MD [place], son [phone]  TELEHEALTH VISIT STATEMENT Due to the COVID-19 crisis, this visit was done via telephone from my office. It was initiated and consented to by this patient and/or family.  RECOMMENDATIONS/PLAN:  Advance Care Planning/Goals of Care: Telehealth visit to build trust and check on palliative care.  Patient remains a full code. Goals of care includeto maximize quality of life and symptom management. Responsible party requests to continue with ongoing telehealth visit until he is more comfortable with the state of COVID-19 pandemic.  NP assured him there will be no in person home visit until he is comfortable.  Visit consisted of discussions dealing with the complex and emotionally intense issues of symptom management and palliative care in the setting of serious and potentially life-threatening illness. Palliative care team will continue to support patient, patient's family, and medical team. Symptom management:No significant changes since last visit. Patient denies pain/discomfort, no medical acuity.  Lyrica is effective in managing neuropathic pain related to trigeminal neuralgia.  Chart review shows no falls or hospitalization since last visit. Son reports no changes in functional status since last visit and no changes to her medication.Also sacral wound resolved/closed. Patient continues on  Desitin cream protectant BIDand air mattress in use to relieve pressure.  Appetite is fair; patient continues on regular diet, regular texture. CNAs from [place] continue to provide care 5 days a week and [name] does same during the weekends. Patient is bedbound; max assist to  wheelcahair. [name] and patient with no complaints or concerns. NP encouraged ongoing care Follow [id number] care will continue to follow patient for goals of care clarification and symptom management. I spent 46 minutes providing this consultation; time includes chart review and documentation. More than 50% of the time in this consultation was spent on coordinating communication [place] a 74 y.o.year oldfemalewith multiple medical problems including MS, trigeminal neuralgia right side, HTN, R BKA. Palliative Care was asked to help address goals of care. CODE STATUS: Full  PPS: 30% HOSPICE ELIGIBILITY/DIAGNOSIS: TBD  PAST MEDICAL HISTORY:  Past Medical History:  Diagnosis Date  . Abnormality of gait   . Depression   . DVT (deep venous thrombosis) (HCC)    Left leg  . Dyslipidemia   . Fibroids   . Hypertension   . Memory difficulties 12/10/2014  . Multiple sclerosis (HCC)    Right sided weakness and gait disorder, bladder and bowel problems   . Neurogenic bladder   . 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 Overdose of muscle relaxant    Unintentional baclofen overdose   . Trigeminal neuralgia   . Trigeminal neuralgia    Right  . Wound infection 10/12/2018    SOCIAL HX:  Social History   Tobacco Use  . Smoking status: Never Smoker  . Smokeless tobacco: Never Used  Substance Use Topics  . Alcohol use: No    ALLERGIES:  Allergies  Allergen Reactions  . Prednisone     Had gotten thrush  . Shellfish Allergy     Hives      PERTINENT MEDICATIONS:  Outpatient Encounter Medications as of 03/16/2020  Medication Sig  . acetaminophen (TYLENOL) 500 MG tablet Take 500 mg by mouth every 6 (six)  hours as needed for mild pain.   Marland Kitchen amLODipine (NORVASC) 10 MG tablet Take 1 tablet (10 mg total) by mouth daily.  . APPLE CIDER VINEGAR PO Take 1 capsule by mouth daily. 450mg   . atorvastatin (LIPITOR) 40 MG tablet Take 40 mg by mouth  daily.    . B Complex-C (B-COMPLEX WITH VITAMIN C) tablet Take 1 tablet by mouth daily.  . baclofen (LIORESAL) 10 MG tablet TAKE 1 TABLET (10 MG TOTAL) BY MOUTH 4 (FOUR) TIMES DAILY.  Marland Kitchen buPROPion (WELLBUTRIN XL) 300 MG 24 hr tablet Take 300 mg by mouth daily.  . carbamazepine (TEGRETOL) 200 MG tablet TAKE 1 TABLET BY MOUTH 3 TIMES A DAY AND 1/2 TABLET AT BEDTIME AT BEDTIME  . Cholecalciferol ([employer] VIT D 5000 HIGH-POTENCY PO) Take 5,000 Int'l Units/L by mouth daily.  Marland Kitchen CRANBERRY PO Take by mouth.  . Diaper Rash Products (DESITIN CLEAR EX) Apply topically.  . docusate sodium (COLACE) 100 MG capsule Take 1 capsule (100 mg total) by mouth 2 (two) times daily.  Marland Kitchen donepezil (ARICEPT) 5 MG tablet TAKE 1 TABLET BY MOUTH EVERY DAY  . FIBER SELECT GUMMIES PO Take 5 g by mouth 2 (two) times daily.   . fish oil-omega-3 fatty acids 1000 MG capsule Take 2 g by mouth 3 (three) times daily.  . hydrALAZINE (APRESOLINE) 10 MG tablet   . hydrochlorothiazide (HYDRODIURIL) 25 MG tablet TAKE 1 TABLET BY MOUTH EVERY DAY IN THE MORNING  . Interferon Beta-1a (AVONEX PEN) 30 MCG/0.5ML AJKT Inject 30 mcg into the muscle every 7 (seven) days.  Marland Kitchen labetalol (NORMODYNE) 100 MG tablet Take 100 mg by mouth 2 (two) times daily.  . Multiple Vitamins-Minerals (MULTIVITAMINS THER. W/MINERALS) TABS Take 1 tablet by mouth daily.    . Potassium Chloride (KLOR-CON PO) Take 20 mEq by mouth 3 (three) times daily.   . pregabalin (LYRICA) 100 MG capsule TAKE 1 CAPSULE BY MOUTH TWICE A DAY  . Probiotic Product (PROBIOTIC PO) Take by mouth.  . sertraline (ZOLOFT) 50 MG tablet Take 75 mg by mouth daily. One and one half tablet once a day in morning.   . Skin Protectants, Misc. (WHITE PETROLATUM-ZINC OXIDE) cream Apply topically as needed. (Patient taking differently: Apply 1 application topically daily as needed for dry skin or wound care. )  . traMADol (ULTRAM) 50 MG tablet TAKE 1 TABLET (50 MG TOTAL) BY MOUTH EVERY 6 (SIX) HOURS AS NEEDED  FOR MODERATE PAIN.  . TURMERIC PO Take 450 mg by mouth.   No facility-administered encounter medications on file as of 03/16/2020.    Meagan Spray, NP

## 2020-04-07 ENCOUNTER — Telehealth: Payer: Self-pay | Admitting: Neurology

## 2020-04-07 NOTE — Telephone Encounter (Signed)
I called the patient's son. Recommended she take the COVID vaccine, she will probably get [name] for [employer] vaccine.

## 2020-04-07 NOTE — Telephone Encounter (Signed)
Joo,[name](son on [employer]) is asking for a call to discuss which vaccine for Covid is best for pt. Please call to discuss

## 2020-04-27 ENCOUNTER — Other Ambulatory Visit: Payer: Self-pay

## 2020-04-27 ENCOUNTER — Other Ambulatory Visit: Payer: Medicare HMO | Admitting: Hospice

## 2020-04-27 DIAGNOSIS — Z515 Encounter for palliative care: Secondary | ICD-10-CM | POA: Diagnosis not present

## 2020-04-27 DIAGNOSIS — G35 Multiple sclerosis: Secondary | ICD-10-CM

## 2020-04-27 NOTE — Progress Notes (Signed)
[facility] Consult Note Telephone: [phone]  Fax: [phone] PATIENT NAME:[name] DOB:1946/01/17 CBJ:[id number]  PRIMARY CARE PROVIDER:[place], Anastasia Pall, MD [place] MD [place], son [phone]  TELEHEALTH VISIT STATEMENT Due to the COVID-19 crisis, this visit was done via telephone from my office. It was initiated and consented to by this patient and/or family.  RECOMMENDATIONS/PLAN:  Advance Care Planning/Goals of Care: Telehealth visit to build trust and check on palliative care.  Patient remains a full code. Goals of care includeto maximize quality of life and symptom management. Responsible party requests to continue with ongoing telehealth visit until he is more comfortable with the state of COVID-19 pandemic.  NP assured him there will be no in person home visit until he is comfortable. Son indicated interest in patient getting the COVID-19 vaccination but regrets that this has been difficult because patient is bedbound.  NP will coordinate with [facility] outreach program that vaccinates patient's who are homebound, through [employer].  NP sent an email to [facility] for more information on vaccination for homebound patient. Visit consisted of discussions dealing with the complex and emotionally intense issues of symptom management and palliative care in the setting of serious and potentially life-threatening illness. Palliative care team will continue to support patient, patient's family, and medical team. Symptom management: Patient with blister to under the left foot.  NP reiterated education on not popping the blister, keeping it clean to prevent infection and continuing with antiseptic Desitin cream.  Patient denied pain, no warmth or redness to blister.  Son said blister is reducing in size; he will continue to monitor and call back PCP if patient deteriorates.  Patient denies pain/discomfort, no medical acuity.   She continues to use  Lyrica - effective in managing neuropathic pain related to trigeminal neuralgia.  Chart review shows no falls or hospitalization since last visit. Son reports no changes in functional status since last visit and no changes to her medication.Also sacral wound resolved/closed. Patient continues onDesitin cream protectant BIDand air mattressin useto relieve pressure.  Patient is bedbound; max assist to wheelchair. Appetite is fair; patient continues on regular diet, regular texture. CNAs from [place] continue to provide care 5 days a week and [name] does same during the weekends.  [name] and patient with no complaints or concerns. NP encouraged ongoing care Follow [id number] care will continue to follow patient for goals of care clarification and symptom management. I spent 61minutes providing this consultation; time includes chart review and documentation.More than 50% of the time in this consultation was spent on coordinating communication [place] a 74 y.o.year oldfemalewith multiple medical problems including MS, trigeminal neuralgia right side, HTN, R BKA. Palliative Care was asked to help address goals of care. CODE STATUS:Full  PPS:30% HOSPICE ELIGIBILITY/DIAGNOSIS: TBD  PAST MEDICAL HISTORY:  Past Medical History:  Diagnosis Date  . Abnormality of gait   . Depression   . DVT (deep venous thrombosis) (HCC)    Left leg  . Dyslipidemia   . Fibroids   . Hypertension   . Memory difficulties 12/10/2014  . Multiple sclerosis (HCC)    Right sided weakness and gait disorder, bladder and bowel problems   . Neurogenic bladder   . 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 Overdose of muscle relaxant    Unintentional baclofen overdose   . Trigeminal neuralgia   . Trigeminal neuralgia    Right  .  Wound infection 10/12/2018    SOCIAL HX:  Social History    Tobacco Use  . Smoking status: Never Smoker  . Smokeless tobacco: Never Used  Substance Use Topics  . Alcohol use: No    ALLERGIES:  Allergies  Allergen Reactions  . Prednisone     Had gotten thrush  . Shellfish Allergy     Hives      PERTINENT MEDICATIONS:  Outpatient Encounter Medications as of 04/27/2020  Medication Sig  . acetaminophen (TYLENOL) 500 MG tablet Take 500 mg by mouth every 6 (six) hours as needed for mild pain.   Marland Kitchen amLODipine (NORVASC) 10 MG tablet Take 1 tablet (10 mg total) by mouth daily.  . APPLE CIDER VINEGAR PO Take 1 capsule by mouth daily. 450mg   . atorvastatin (LIPITOR) 40 MG tablet Take 40 mg by mouth daily.    . B Complex-C (B-COMPLEX WITH VITAMIN C) tablet Take 1 tablet by mouth daily.  . baclofen (LIORESAL) 10 MG tablet TAKE 1 TABLET (10 MG TOTAL) BY MOUTH 4 (FOUR) TIMES DAILY.  Marland Kitchen buPROPion (WELLBUTRIN XL) 300 MG 24 hr tablet Take 300 mg by mouth daily.  . carbamazepine (TEGRETOL) 200 MG tablet TAKE 1 TABLET BY MOUTH 3 TIMES A DAY AND 1/2 TABLET AT BEDTIME AT BEDTIME  . Cholecalciferol (CVS VIT D 5000 HIGH-POTENCY PO) Take 5,000 Int'l Units/L by mouth daily.  Marland Kitchen CRANBERRY PO Take by mouth.  . Diaper Rash Products (DESITIN CLEAR EX) Apply topically.  . docusate sodium (COLACE) 100 MG capsule Take 1 capsule (100 mg total) by mouth 2 (two) times daily.  Marland Kitchen donepezil (ARICEPT) 5 MG tablet TAKE 1 TABLET BY MOUTH EVERY DAY  . FIBER SELECT GUMMIES PO Take 5 g by mouth 2 (two) times daily.   . fish oil-omega-3 fatty acids 1000 MG capsule Take 2 g by mouth 3 (three) times daily.  . hydrALAZINE (APRESOLINE) 10 MG tablet   . hydrochlorothiazide (HYDRODIURIL) 25 MG tablet TAKE 1 TABLET BY MOUTH EVERY DAY IN THE MORNING  . Interferon Beta-1a (AVONEX PEN) 30 MCG/0.5ML AJKT Inject 30 mcg into the muscle every 7 (seven) days.  Marland Kitchen labetalol (NORMODYNE) 100 MG tablet Take 100 mg by mouth 2 (two) times daily.  . Multiple Vitamins-Minerals (MULTIVITAMINS THER.  W/MINERALS) TABS Take 1 tablet by mouth daily.    . Potassium Chloride (KLOR-CON PO) Take 20 mEq by mouth 3 (three) times daily.   . pregabalin (LYRICA) 100 MG capsule TAKE 1 CAPSULE BY MOUTH TWICE A DAY  . Probiotic Product (PROBIOTIC PO) Take by mouth.  . sertraline (ZOLOFT) 50 MG tablet Take 75 mg by mouth daily. One and one half tablet once a day in morning.   . Skin Protectants, Misc. (WHITE PETROLATUM-ZINC OXIDE) cream Apply topically as needed. (Patient taking differently: Apply 1 application topically daily as needed for dry skin or wound care. )  . traMADol (ULTRAM) 50 MG tablet TAKE 1 TABLET (50 MG TOTAL) BY MOUTH EVERY 6 (SIX) HOURS AS NEEDED FOR MODERATE PAIN.  . TURMERIC PO Take 450 mg by mouth.   No facility-administered encounter medications on file as of 04/27/2020.    Teodoro Spray, NP

## 2020-05-11 ENCOUNTER — Other Ambulatory Visit: Payer: Self-pay | Admitting: Neurology

## 2020-05-12 NOTE — Telephone Encounter (Signed)
Per [place] registry, last filled tramadol 11/05/19 #[id number]

## 2020-06-10 ENCOUNTER — Other Ambulatory Visit: Payer: Self-pay | Admitting: Neurology

## 2020-06-16 ENCOUNTER — Other Ambulatory Visit: Payer: Self-pay

## 2020-06-16 ENCOUNTER — Encounter: Payer: Self-pay | Admitting: Neurology

## 2020-06-16 ENCOUNTER — Ambulatory Visit: Payer: Medicare HMO | Admitting: Neurology

## 2020-06-16 VITALS — BP 107/57 | HR 57

## 2020-06-16 DIAGNOSIS — R413 Other amnesia: Secondary | ICD-10-CM | POA: Diagnosis not present

## 2020-06-16 DIAGNOSIS — G35 Multiple sclerosis: Secondary | ICD-10-CM

## 2020-06-16 DIAGNOSIS — R269 Unspecified abnormalities of gait and mobility: Secondary | ICD-10-CM

## 2020-06-16 DIAGNOSIS — G500 Trigeminal neuralgia: Secondary | ICD-10-CM | POA: Diagnosis not present

## 2020-06-16 MED ORDER — PREGABALIN 75 MG PO CAPS
75.0000 mg | ORAL_CAPSULE | Freq: Two times a day (BID) | ORAL | 0 refills | Status: DC
Start: 2020-06-16 — End: 2020-12-21

## 2020-06-16 MED ORDER — DONEPEZIL HCL 5 MG PO TABS: 5.0000 mg | ORAL_TABLET | Freq: Every day | ORAL | 4 refills | Status: DC

## 2020-06-16 MED ORDER — CARBAMAZEPINE 200 MG PO TABS: ORAL_TABLET | ORAL | 1 refills | Status: DC

## 2020-06-16 NOTE — Patient Instructions (Signed)
Decrease dose of Lyrica to 75 mg twice daily-now taking 100 mg twice daily, drop the morning dose first  Check blood work today We will continue current medications Call if the drowsiness doesn't improve See you back in 6 months

## 2020-06-16 NOTE — Progress Notes (Signed)
PATIENT: [name] DOB: 1945/10/28  REASON FOR VISIT: follow up HISTORY FROM: patient  HISTORY OF PRESENT ILLNESS: Today 06/16/20 [name] is a 74 year old female history of multiple sclerosis on Avonex.  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  She has trigeminal neuralgia.  She is on Lyrica and carbamazepine, takes tramadol for breakthrough pain.  She has a prosthetic for right BKA.  Her son has noted, she is more drowsy, at times, speech may be garbled, she may drool. Sometimes feels her left eye is lazy.  Symptoms are all intermittent. Her TN has been well controlled, no recent flares, not taking tramadol.  She is taking Lyrica 100 mg twice a day.  She has an aide who comes 5 days a week for 2 hours in the morning to help with bathing.  She is essentially nonambulatory.  She had a blister to her left foot that is healing.  Is here today accompanied by her son, [name].  She is taking baclofen, carbamazepine, Aricept, Avonex, Lyrica, tramadol from this office.  HISTORY  12/10/2019 SS: [name] is a 74 year old female with history of multiple sclerosis on Avonex.  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  She has history of trigeminal neuralgia, that may flare occasionally.  She is taking Lyrica and carbamazepine.  She will take tramadol for breakthrough flares.  Her last flare occurred in November, she has been doing well.  She stopped the Detrol.  Her sacral ulcer has healed.  She has a prosthetic for a right BKA.  She did not do physical therapy yet.  She has reduced her dose of Lyrica back to 100 mg twice daily.  Her mental status has been good, memory is stable, takes Aricept.  She has a home health aide who comes 10 hours a week.  She denies any new problems or concerns.  She presents today accompanied by her son.  REVIEW OF SYSTEMS: Out of a complete 14  system review of symptoms, the patient complains only of the following symptoms, and all other reviewed systems are negative.  See HPI  ALLERGIES: Allergies  Allergen Reactions  . Prednisone     Had gotten thrush  . Shellfish Allergy     Hives     HOME MEDICATIONS: Outpatient Medications Prior to Visit  Medication Sig Dispense Refill  . traMADol (ULTRAM) 50 MG tablet TAKE 1 TABLET (50 MG TOTAL) BY MOUTH EVERY 6 (SIX) HOURS AS NEEDED FOR MODERATE PAIN. 30 tablet 0  . acetaminophen (TYLENOL) 500 MG tablet Take 500 mg by mouth every 6 (six) hours as needed for mild pain.     Marland Kitchen amLODipine (NORVASC) 10 MG tablet Take 1 tablet (10 mg total) by mouth daily. 30 tablet 3  . APPLE CIDER VINEGAR PO Take 1 capsule by mouth daily. 450mg     . atorvastatin (LIPITOR) 40 MG tablet Take 40 mg by mouth daily.      . B Complex-C (B-COMPLEX WITH VITAMIN C) tablet Take 1 tablet by mouth daily.    . baclofen (LIORESAL) 10 MG tablet TAKE 1 TABLET (10 MG TOTAL) BY MOUTH 4 (FOUR) TIMES DAILY. 360 tablet 1  . buPROPion (WELLBUTRIN XL) 300 MG 24 hr tablet Take 300 mg by mouth daily.    . Cholecalciferol (CVS VIT D 5000 HIGH-POTENCY PO) Take 5,000 Int'l Units/L by mouth daily.    Marland Kitchen  CRANBERRY PO Take by mouth.    . Diaper Rash Products (DESITIN CLEAR EX) Apply topically.    . docusate sodium (COLACE) 100 MG capsule Take 1 capsule (100 mg total) by mouth 2 (two) times daily. 10 capsule 0  . FIBER SELECT GUMMIES PO Take 5 g by mouth 2 (two) times daily.     . fish oil-omega-3 fatty acids 1000 MG capsule Take 2 g by mouth 3 (three) times daily.    . hydrALAZINE (APRESOLINE) 10 MG tablet     . hydrochlorothiazide (HYDRODIURIL) 25 MG tablet TAKE 1 TABLET BY MOUTH EVERY DAY IN THE MORNING    . Interferon Beta-1a (AVONEX PEN) 30 MCG/0.5ML AJKT Inject 30 mcg into the muscle every 7 (seven) days. 12 each 1  . labetalol (NORMODYNE) 100 MG tablet Take 100 mg by mouth 2 (two) times daily.    . Multiple Vitamins-Minerals  (MULTIVITAMINS THER. W/MINERALS) TABS Take 1 tablet by mouth daily.      . Potassium Chloride (KLOR-CON PO) Take 20 mEq by mouth 3 (three) times daily.     . Probiotic Product (PROBIOTIC PO) Take by mouth.    . sertraline (ZOLOFT) 50 MG tablet Take 75 mg by mouth daily. One and one half tablet once a day in morning.     . Skin Protectants, Misc. (WHITE PETROLATUM-ZINC OXIDE) cream Apply topically as needed. (Patient taking differently: Apply 1 application topically daily as needed for dry skin or wound care. ) 98 g 0  . TURMERIC PO Take 450 mg by mouth.    . carbamazepine (TEGRETOL) 200 MG tablet TAKE 1 TABLET BY MOUTH 3 TIMES A DAY AND 1/2 TABLET AT BEDTIME AT BEDTIME 315 tablet 1  . donepezil (ARICEPT) 5 MG tablet TAKE 1 TABLET BY MOUTH EVERY DAY 90 tablet 4  . pregabalin (LYRICA) 100 MG capsule TAKE 1 CAPSULE BY MOUTH TWICE A DAY 60 capsule 5   No facility-administered medications prior to visit.    PAST MEDICAL HISTORY: Past Medical History:  Diagnosis Date  . Abnormality of gait   . Depression   . DVT (deep venous thrombosis) (HCC)    Left leg  . Dyslipidemia   . Fibroids   . Hypertension   . Memory difficulties 12/10/2014  . Multiple sclerosis (HCC)    Right sided weakness and gait disorder, bladder and bowel problems   . Neurogenic bladder   . 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 Overdose of muscle relaxant    Unintentional baclofen overdose   . Trigeminal neuralgia   . Trigeminal neuralgia    Right  . Wound infection 10/12/2018    PAST SURGICAL HISTORY: Past Surgical History:  Procedure Laterality Date  . AMPUTATION Right 10/17/2018   Procedure: RIGHT BELOW KNEE AMPUTATION;  Surgeon: [name], MD;  Location: [facility];  Service: Orthopedics;  Laterality: Right;  . gamma knife procedure for rt. trigeminal neuralgia Right   . left leg DVT Left   . STRABISMUS SURGERY     Right eye     FAMILY HISTORY: Family History  Problem Relation Age of Onset  . Multiple  sclerosis Sister   . Cervical cancer Mother   . Liver disease Father   . Hyperlipidemia Brother   . Pancreatitis Brother   . Hypertension Other   . Heart disease Other   . Uterine cancer Other     SOCIAL HISTORY: Social History   Socioeconomic History  . Marital status: Divorced  Spouse name: Not on file  . Number of children: 2  . Years of education: 25  . Highest education level: Not on file  Occupational History  . Occupation: disabled Education officer, museum  Tobacco Use  . Smoking status: Never Smoker  . Smokeless tobacco: Never Used  Vaping Use  . Vaping Use: Never used  Substance and Sexual Activity  . Alcohol use: No  . Drug use: No  . Sexual activity: Not Currently    Birth control/protection: Post-menopausal  Other Topics Concern  . Not on file  Social History Narrative   Divorced, has a son and daughter. [name] is very involved in her care. [phone] and [phone]   Living with her son, [name] as FT caregiver.  02-09-15 ssy      Patient drinks about 2 sodas daily.   Patient is right handed.    Social Determinants of Health   Financial Resource Strain:   . Difficulty of Paying Living Expenses: Not on file  Food Insecurity:   . Worried About Charity fundraiser in the Last Year: Not on file  . Ran Out of Food in the Last Year: Not on file  Transportation Needs:   . Lack of Transportation (Medical): Not on file  . Lack of Transportation (Non-Medical): Not on file  Physical Activity:   . Days of Exercise per Week: Not on file  . Minutes of Exercise per Session: Not on file  Stress:   . Feeling of Stress : Not on file  Social Connections:   . Frequency of Communication with Friends and Family: Not on file  . Frequency of Social Gatherings with Friends and Family: Not on file  . Attends Religious Services: Not on file  . Active Member of Clubs or Organizations: Not on file  . Attends Archivist Meetings: Not on file  . Marital Status: Not on file    Intimate Partner Violence:   . Fear of Current or Ex-Partner: Not on file  . Emotionally Abused: Not on file  . Physically Abused: Not on file  . Sexually Abused: Not on file   PHYSICAL EXAM  Vitals:   06/16/20 1315  BP: (!) 107/57  Pulse: (!) 57   There is no height or weight on file to calculate BMI.  Generalized: Well developed, in no acute distress   Neurological examination  Mentation: Alert oriented to self, situation, history is mostly provided by her son. Follows all commands speech is slightly mumbled  Cranial nerve II-XII: Pupils were equal round reactive to light. Extraocular movements were full, visual field were full on confrontational test. Facial sensation and strength were normal.  Head turning and shoulder shrug  were normal and symmetric. Motor: Greater weakness in the right arm than the left, right BKA,RUE 3/5, very limited mobility of LLE  Sensory: Sensory testing is intact to soft touch on all 4 extremities. No evidence of extinction is noted.  Coordination: Slight ataxia with right finger-nose-finger, cannot perform heel-to-shin Gait and station: In a wheelchair, is nonambulatory Reflexes: Deep tendon reflexes are symmetric  DIAGNOSTIC DATA (LABS, IMAGING, TESTING) - I reviewed patient records, labs, notes, testing and imaging myself where available.  Lab Results  Component Value Date   WBC 4.5 06/04/2019   HGB 11.2 06/04/2019   HCT 33.8 (L) 06/04/2019   MCV 93 06/04/2019   PLT 202 06/04/2019      Component Value Date/Time   NA 142 06/04/2019 1549   K 4.0 06/04/2019 1549  CL 104 06/04/2019 1549   CO2 22 06/04/2019 1549   GLUCOSE 106 (H) 06/04/2019 1549   GLUCOSE 91 10/24/2018 0302   BUN 21 06/04/2019 1549   CREATININE 0.60 06/04/2019 1549   CALCIUM 9.6 06/04/2019 1549   PROT 6.6 06/04/2019 1549   ALBUMIN 3.9 06/04/2019 1549   AST 22 06/04/2019 1549   ALT 16 06/04/2019 1549   ALKPHOS 115 06/04/2019 1549   BILITOT <0.2 06/04/2019 1549    GFRNONAA 93 06/04/2019 1549   GFRAA 107 06/04/2019 1549   No results found for: CHOL, HDL, LDLCALC, LDLDIRECT, TRIG, CHOLHDL No results found for: HGBA1C Lab Results  Component Value Date   VITAMINB12 1,624 (H) 10/12/2018   No results found for: TSH  ASSESSMENT AND PLAN 74 y.o. year old female  has a past medical history of Abnormality of gait, Depression, DVT (deep venous thrombosis) ([place]), Dyslipidemia, Fibroids, Hypertension, Memory difficulties (12/10/2014), Multiple sclerosis ([place]), Neurogenic bladder, Organic brain syndrome, Overdose of muscle relaxant, Trigeminal neuralgia, Trigeminal neuralgia, and Wound infection (10/12/2018). here with:  1.  Multiple sclerosis -Chronic right-sided weakness in her arm, generalized weakness, nonambulatory -Continue Avonex injections -Continue baclofen  2.  Dementia -Overall stable, continue Aricept  3.  Gait disorder -Is in a wheelchair, nonambulatory  4.  Trigeminal neuralgia -Has become more drowsy, potentially overmedicated? -TN has been under good control, will try decrease Lyrica 75 mg twice daily -Keeps tramadol on hand as needed, not taken recently -Will check routine blood work today, carbamazepine level -Will see her back in 6 months, possibly sooner if we need to make dose adjustment  I spent 30 minutes of face-to-face and non-face-to-face time with patient.  This included previsit chart review, lab review, study review, order entry, electronic health record documentation, patient education.  Butler Denmark, AGNP-C, DNP 06/16/2020, 3:37 PM [facility] [address] [place], [place] [address] [phone]

## 2020-06-17 ENCOUNTER — Telehealth: Payer: Self-pay | Admitting: Neurology

## 2020-06-17 LAB — CBC WITH DIFFERENTIAL/PLATELET
Basophils Absolute: 0 10*3/uL (ref 0.0–0.2)
Basos: 1 %
EOS (ABSOLUTE): 0 10*3/uL (ref 0.0–0.4)
Eos: 0 %
Hematocrit: 35.1 % (ref 34.0–46.6)
Hemoglobin: 11.7 g/dL (ref 11.1–15.9)
Immature Grans (Abs): 0 10*3/uL (ref 0.0–0.1)
Immature Granulocytes: 1 %
Lymphocytes Absolute: 1.1 10*3/uL (ref 0.7–3.1)
Lymphs: 30 %
MCH: 31 pg (ref 26.6–33.0)
MCHC: 33.3 g/dL (ref 31.5–35.7)
MCV: 93 fL (ref 79–97)
Monocytes Absolute: 0.4 10*3/uL (ref 0.1–0.9)
Monocytes: 11 %
Neutrophils Absolute: 2.2 10*3/uL (ref 1.4–7.0)
Neutrophils: 57 %
Platelets: 150 10*3/uL (ref 150–450)
RBC: 3.78 x10E6/uL (ref 3.77–5.28)
RDW: 13.3 % (ref 11.7–15.4)
WBC: 3.7 10*3/uL (ref 3.4–10.8)

## 2020-06-17 LAB — COMPREHENSIVE METABOLIC PANEL
ALT: 14 IU/L (ref 0–32)
AST: 22 IU/L (ref 0–40)
Albumin/Globulin Ratio: 1.7 (ref 1.2–2.2)
Albumin: 4.1 g/dL (ref 3.7–4.7)
Alkaline Phosphatase: 119 IU/L (ref 48–121)
BUN/Creatinine Ratio: 20 (ref 12–28)
BUN: 13 mg/dL (ref 8–27)
Bilirubin Total: 0.2 mg/dL (ref 0.0–1.2)
CO2: 25 mmol/L (ref 20–29)
Calcium: 9.4 mg/dL (ref 8.7–10.3)
Chloride: 107 mmol/L — ABNORMAL HIGH (ref 96–106)
Creatinine, Ser: 0.65 mg/dL (ref 0.57–1.00)
GFR calc Af Amer: 103 mL/min/{1.73_m2} (ref >59)
GFR calc non Af Amer: 90 mL/min/{1.73_m2} (ref >59)
Globulin, Total: 2.4 g/dL (ref 1.5–4.5)
Glucose: 99 mg/dL (ref 65–99)
Potassium: 4.2 mmol/L (ref 3.5–5.2)
Sodium: 146 mmol/L — ABNORMAL HIGH (ref 134–144)
Total Protein: 6.5 g/dL (ref 6.0–8.5)

## 2020-06-17 LAB — CARBAMAZEPINE LEVEL, TOTAL: Carbamazepine (Tegretol), S: 12 ug/mL (ref 4.0–12.0)

## 2020-06-17 MED ORDER — CARBAMAZEPINE 200 MG PO TABS: ORAL_TABLET | ORAL | 1 refills | Status: DC

## 2020-06-17 NOTE — Progress Notes (Signed)
I have read the note, and I agree with the clinical assessment and plan.  [name] K [name]   

## 2020-06-17 NOTE — Telephone Encounter (Signed)
I called her son, carbamazepine level came back at 12.0, high end, currently taking 200 mg tablet, 1 tablet 3 times daily and 1/2 tablet at bedtime, she will eliminate 1/2 tablet at bedtime, see if this helps her drowsiness. We may need to further reduce the dosing.  We are planning to drop Lyrica from 100 mg twice a day, to 75 mg twice a day, her son is going to try the carbamazepine dose reduction first, call next week with update.  Sodium level was mildly elevated 146, he will make sure drinking enough water.

## 2020-06-25 DIAGNOSIS — Z23 Encounter for immunization: Secondary | ICD-10-CM | POA: Diagnosis not present

## 2020-06-25 DIAGNOSIS — Z1211 Encounter for screening for malignant neoplasm of colon: Secondary | ICD-10-CM | POA: Diagnosis not present

## 2020-06-25 DIAGNOSIS — I10 Essential (primary) hypertension: Secondary | ICD-10-CM | POA: Diagnosis not present

## 2020-06-26 NOTE — Telephone Encounter (Signed)
Pt's son called stating that they can not lower the dosage on the pt's carbamazepine (TEGRETOL) 200 MG tablet He states that when they tried the pt was in pain but they did change the dosage on her pregabalin (LYRICA) 75 MG capsule and she seems she is doing well with the change. Please advise. [place]

## 2020-06-29 ENCOUNTER — Other Ambulatory Visit: Payer: Self-pay

## 2020-06-29 ENCOUNTER — Other Ambulatory Visit: Payer: Medicare HMO | Admitting: Hospice

## 2020-06-29 DIAGNOSIS — G35 Multiple sclerosis: Secondary | ICD-10-CM

## 2020-06-29 DIAGNOSIS — Z515 Encounter for palliative care: Secondary | ICD-10-CM | POA: Diagnosis not present

## 2020-06-29 MED ORDER — TRAMADOL HCL 50 MG PO TABS: 50.0000 mg | ORAL_TABLET | Freq: Four times a day (QID) | ORAL | 0 refills | Status: DC | PRN

## 2020-06-29 NOTE — Progress Notes (Signed)
[facility] Pall[facility] Consult Note Telephone: [phone]  Fax: [phone]  PATIENT NAME: [name] DOB: August 20, 1946 MRN: [id number]  PRIMARY CARE PROVIDER:   Glenis Smoker, MD Meagan Smoker, MD [place],  [place] [address]  REFERRING PROVIDER: Glenis Smoker, MD Meagan Smoker, MD [place],  [place] [address]  RESPONSIBLE PARTY:   REFERRING PROVIDER:Webb Arbie Cookey MD [place], son [phone]  TELEHEALTH VISIT STATEMENT Due to the COVID-19 crisis, this visit was done via telephone from my office. It was initiated and consented to by this patient and/or family.  RECOMMENDATIONS/PLAN:  Advance Care Planning/Goals of Care:Telehealth visit to build trust and check on palliative care.  Advance care planning discussions included the value and importance of advance care planning, exploration of goals of care in the event of a sudden illness/injury all progresssion of ongoing disease.  It also included identification of the healthcare agent, exploration of personal/cultural/spiritual beliefs that might influence advance care planning/medical decisions, and review and updates or completion of advance directive document.  Code Status: CODE STATUS reviewed today.  Patient remains a FULL code.   Goals of care: Goals of care includeto maximize quality of life and symptom management. Patient wants everything done to keep her alive.  Family not interested in hospice service at this time. Responsible party requeststo continue withongoing telehealth visit until he is more comfortable with the state of COVID-19 pandemic. Both patient and responsible part are now vaccinated. Visit consisted of discussions dealing with the complex and emotionally intense issues of symptom management and palliative care in the setting of serious and potentially life-threatening  illness. Palliative care team will continue to support patient, patient's family, and medical team.  Follow up: Palliative care will continue to follow patient for goals of care clarification and symptom management.  Follow-up in 3 months.  Symptom management:  Son reported that patient's labetalol was discontinued last week by PCP because of patient's BP issues and lethargy. Patient saw her neurologist 2 weeks ago; her Lyrica was also decreased; neuropathic pain related to trigeminal neuralgia well managed with a decreased dose. Patient is now more alert and interactive; no BP issues at this time.    Blister to left foot and sacral wound resolved. Desitin cream protectant BIDand air mattressin useto relieve pressure. No recent falls/hospitalization; patient in no medical acuity.She denied pain/discomfort. She is compliant with her medications. Patient is bedbound; max assist to wheelchair. Appetite is fair; patient continues on regular diet, regular texture.NP encouraged ongoing care.  Palliative will continue to monitor for symptom management/decline and make recommendations as needed.  Community/caregiver/family support: Patient lives at home with son who is involved in her care.  CNAs from [facility] 5 days a week and [name] does same during the weekends.    I spent79minutes providing this consultation; time includes chart review and documentation.More than 50% of the time in this consultation was spent on coordinating communication  [name] a 74 y.o.year oldfemalewith multiple medical problems including MS, trigeminal neuralgia right side, HTN, R BKA. Palliative Care was asked to help address goals of care.  CODE STATUS:Full   PPS: 30%  HOSPICE ELIGIBILITY/DIAGNOSIS: TBD  PAST MEDICAL HISTORY:  Past Medical History:  Diagnosis Date  . Abnormality of gait   . Depression   . DVT (deep venous thrombosis)  (HCC)    Left leg  . Dyslipidemia   . Fibroids   .  Hypertension   . Memory difficulties 12/10/2014  . Multiple sclerosis (HCC)    Right sided weakness and gait disorder, bladder and bowel problems   . Neurogenic bladder   . 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 Overdose of muscle relaxant    Unintentional baclofen overdose   . Trigeminal neuralgia   . Trigeminal neuralgia    Right  . Wound infection 10/12/2018    SOCIAL HX:  Social History   Tobacco Use  . Smoking status: Never Chen  . Smokeless tobacco: Never Used  Substance Use Topics  . Alcohol use: No    ALLERGIES:  Allergies  Allergen Reactions  . Prednisone     Had gotten thrush  . Shellfish Allergy     Hives      PERTINENT MEDICATIONS:  Outpatient Encounter Medications as of 06/29/2020  Medication Sig  . acetaminophen (TYLENOL) 500 MG tablet Take 500 mg by mouth every 6 (six) hours as needed for mild pain.   Marland Kitchen amLODipine (NORVASC) 10 MG tablet Take 1 tablet (10 mg total) by mouth daily.  . APPLE CIDER VINEGAR PO Take 1 capsule by mouth daily. 450mg   . atorvastatin (LIPITOR) 40 MG tablet Take 40 mg by mouth daily.    . B Complex-C (B-COMPLEX WITH VITAMIN C) tablet Take 1 tablet by mouth daily.  . baclofen (LIORESAL) 10 MG tablet TAKE 1 TABLET (10 MG TOTAL) BY MOUTH 4 (FOUR) TIMES DAILY.  Marland Kitchen buPROPion (WELLBUTRIN XL) 300 MG 24 hr tablet Take 300 mg by mouth daily.  . carbamazepine (TEGRETOL) 200 MG tablet TAKE 1 TABLET BY MOUTH 3 TIMES A DAY  . Cholecalciferol (CVS VIT D 5000 HIGH-POTENCY PO) Take 5,000 Int'l Units/L by mouth daily.  Marland Kitchen CRANBERRY PO Take by mouth.  . Diaper Rash Products (DESITIN CLEAR EX) Apply topically.  . docusate sodium (COLACE) 100 MG capsule Take 1 capsule (100 mg total) by mouth 2 (two) times daily.  Marland Kitchen donepezil (ARICEPT) 5 MG tablet Take 1 tablet (5 mg total) by mouth daily.  Marland Kitchen FIBER SELECT GUMMIES PO Take 5 g by mouth 2 (two) times daily.   . fish oil-omega-3 fatty acids 1000 MG  capsule Take 2 g by mouth 3 (three) times daily.  . hydrALAZINE (APRESOLINE) 10 MG tablet   . hydrochlorothiazide (HYDRODIURIL) 25 MG tablet TAKE 1 TABLET BY MOUTH EVERY DAY IN THE MORNING  . Interferon Beta-1a (AVONEX PEN) 30 MCG/0.5ML AJKT Inject 30 mcg into the muscle every 7 (seven) days.  Marland Kitchen labetalol (NORMODYNE) 100 MG tablet Take 100 mg by mouth 2 (two) times daily.  . Multiple Vitamins-Minerals (MULTIVITAMINS THER. W/MINERALS) TABS Take 1 tablet by mouth daily.    . Potassium Chloride (KLOR-CON PO) Take 20 mEq by mouth 3 (three) times daily.   . pregabalin (LYRICA) 75 MG capsule Take 1 capsule (75 mg total) by mouth 2 (two) times daily.  . Probiotic Product (PROBIOTIC PO) Take by mouth.  . sertraline (ZOLOFT) 50 MG tablet Take 75 mg by mouth daily. One and one half tablet once a day in morning.   . Skin Protectants, Misc. (WHITE PETROLATUM-ZINC OXIDE) cream Apply topically as needed. (Patient taking differently: Apply 1 application topically daily as needed for dry skin or wound care. )  . traMADol (ULTRAM) 50 MG tablet TAKE 1 TABLET (50 MG TOTAL) BY MOUTH EVERY 6 (SIX) HOURS AS NEEDED FOR MODERATE PAIN.  . TURMERIC PO Take 450 mg by mouth.   No facility-administered encounter  medications on file as of 06/29/2020.    Teodoro Spray, NP

## 2020-06-29 NOTE — Addendum Note (Signed)
Addended by: Suzzanne Cloud on: 06/29/2020 03:00 PM   Modules accepted: Orders

## 2020-06-29 NOTE — Telephone Encounter (Signed)
How about go back to Lyrica 100 mg twice daily for a few days and then cut back out the carbamazepine 1/2 tablet at bedtime, keep 200 mg 3 times daily. I will send in Tramadol.

## 2020-06-29 NOTE — Addendum Note (Signed)
Addended by: Brandon Melnick on: 06/29/2020 02:05 PM   Modules accepted: Orders

## 2020-06-29 NOTE — Telephone Encounter (Signed)
Son, tried decreasing the carbamazepine by 1/2 in the am and pt after 2 hours was in pain.  Pt is taking 75mg  po bid.  Needs tramadol refill too.  Do you want to try again later per son?

## 2020-06-29 NOTE — Telephone Encounter (Signed)
Per the last telephone note, plan was to drop the carbamazepine dosing first before doing the Lyrica. I am okay with higher dosing of Lyrica if we can do lower dose of Carbamazepine.

## 2020-06-30 NOTE — Telephone Encounter (Signed)
I called son, doug.  I relayed that per SS/NP really wants pt to come off the 1/2 tablet carbamazepine in AM.  Try lyrica 100mg  AM and 75mg  PM. Continue with carbamazepine 200mg  po TID and see how she does.  Did not want to increase the tramadol as she already is so sleepy.  He verbalized understanding and will let us know if things not working out.

## 2020-07-08 ENCOUNTER — Telehealth: Payer: Self-pay | Admitting: Neurology

## 2020-07-08 MED ORDER — AVONEX PEN 30 MCG/0.5ML IM AJKT: 30.0000 ug | AUTO-INJECTOR | INTRAMUSCULAR | 1 refills | Status: DC

## 2020-07-08 NOTE — Telephone Encounter (Signed)
[name] is calling from [facility] and needing a refill on on Avonex telephone [phone] opt.  2

## 2020-07-08 NOTE — Addendum Note (Signed)
Addended by: Valerie Salts on: 07/08/2020 09:57 AM   Modules accepted: Orders

## 2020-07-08 NOTE — Telephone Encounter (Signed)
Refill sent electronically

## 2020-07-14 DIAGNOSIS — R928 Other abnormal and inconclusive findings on diagnostic imaging of breast: Secondary | ICD-10-CM | POA: Diagnosis not present

## 2020-07-14 DIAGNOSIS — N6002 Solitary cyst of left breast: Secondary | ICD-10-CM | POA: Diagnosis not present

## 2020-07-16 ENCOUNTER — Other Ambulatory Visit: Payer: Self-pay | Admitting: Neurology

## 2020-07-20 ENCOUNTER — Telehealth: Payer: Self-pay | Admitting: Neurology

## 2020-07-20 MED ORDER — PREGABALIN 75 MG PO CAPS: 75.0000 mg | ORAL_CAPSULE | Freq: Every evening | ORAL | 5 refills | Status: DC

## 2020-07-20 MED ORDER — PREGABALIN 100 MG PO CAPS: 100.0000 mg | ORAL_CAPSULE | Freq: Every morning | ORAL | 5 refills | Status: DC

## 2020-07-20 NOTE — Telephone Encounter (Signed)
Pt's son [name] on [place] called needing to discuss the pt's medications carbamazepine (TEGRETOL) 200 MG tablet and pregabalin (LYRICA) 75 MG capsule with RN. Please advise.

## 2020-07-20 NOTE — Telephone Encounter (Signed)
I called son, doug.  Pt has been taking the carbamazepine 200mg  po TID. lyrica 100mg  am and 75mg  po pm (has been out the last 3 days of the 75mg  ).  Needs refill.  Has been doing well.

## 2020-08-04 DIAGNOSIS — Z1211 Encounter for screening for malignant neoplasm of colon: Secondary | ICD-10-CM | POA: Diagnosis not present

## 2020-10-06 DIAGNOSIS — F325 Major depressive disorder, single episode, in full remission: Secondary | ICD-10-CM | POA: Diagnosis not present

## 2020-10-06 DIAGNOSIS — F0391 Unspecified dementia with behavioral disturbance: Secondary | ICD-10-CM | POA: Diagnosis not present

## 2020-10-06 DIAGNOSIS — L89152 Pressure ulcer of sacral region, stage 2: Secondary | ICD-10-CM | POA: Diagnosis not present

## 2020-10-06 DIAGNOSIS — E7800 Pure hypercholesterolemia, unspecified: Secondary | ICD-10-CM | POA: Diagnosis not present

## 2020-10-06 DIAGNOSIS — Z89511 Acquired absence of right leg below knee: Secondary | ICD-10-CM | POA: Diagnosis not present

## 2020-10-06 DIAGNOSIS — R7309 Other abnormal glucose: Secondary | ICD-10-CM | POA: Diagnosis not present

## 2020-10-06 DIAGNOSIS — I10 Essential (primary) hypertension: Secondary | ICD-10-CM | POA: Diagnosis not present

## 2020-10-06 DIAGNOSIS — G35 Multiple sclerosis: Secondary | ICD-10-CM | POA: Diagnosis not present

## 2020-10-06 DIAGNOSIS — Z0000 Encounter for general adult medical examination without abnormal findings: Secondary | ICD-10-CM | POA: Diagnosis not present

## 2020-10-12 ENCOUNTER — Other Ambulatory Visit: Payer: Self-pay

## 2020-10-12 ENCOUNTER — Other Ambulatory Visit: Payer: Medicare HMO | Admitting: Hospice

## 2020-10-12 DIAGNOSIS — G35 Multiple sclerosis: Secondary | ICD-10-CM

## 2020-10-12 DIAGNOSIS — Z515 Encounter for palliative care: Secondary | ICD-10-CM

## 2020-10-12 NOTE — Progress Notes (Signed)
Therapist, nutritional Palliative Care Consult Note Telephone: [phone]  Fax: [phone]  PATIENT NAME: [name] DOB: 01-21-1946 MRN: [id number]  PRIMARY CARE PROVIDER:   Shon Hale, MD Shon Hale, MD [address] [place],  [place] [address]  REFERRING PROVIDER: Shon Hale, MD Shon Hale, MD [address] [place],  [place] [address]  REFERRING PROVIDER:Webb Okey Regal MD RESPONSIBLE PARTY:[name] Hamblen, son [phone]  TELEHEALTH VISIT STATEMENT Due to the COVID-19 crisis, this visit was done via telephone from my office. It was initiated and consented to by this patient and/or family.  RECOMMENDATIONS/PLAN:  Advance Care Planning/Goals of Care:Telehealth visit to build trust and check on palliative care.   Code Status: CODE STATUS reviewed.  Patient remains a FULL code.   Goals of care: Goals of care includeto maximize quality of life and symptom management. Patient wants everything done to keep her alive.  Family not interested in hospice service at this time. Responsible party requeststo continue withongoing telehealth visit until he is more comfortable with the state of COVID-19 pandemic. Both patient and responsible part are now vaccinated. Palliative care team will continue to support patient, patient's family, and medical team.  Follow up: Palliative care will continue to follow patient for goals of care clarification and symptom management.  Follow-up in 3 months; [name] prefers telehealth because of his concerns about COVID-19.  Symptom management:Recurring sacral ulcer. Order from PCP for home health nurse to commence wound care service. Continue Desitin barrier cream and use of air mattress. Education on regular position changes to relieve pressure; adequate protein intake and proper nourishment to promote healing and skin repair. Patient is bedbound; max assist to  wheelchair. Neuropathic pain: Continue Lyrica as ordered .[name] reports no BP issues at this time.    No recent falls/hospitalization; patient in no medical acuity.She denied pain/discomfort. She is compliant with her medications. Palliative will continue to monitor for symptom management/decline and make recommendations as needed.  Community/caregiver/family support: Patient lives at home with son who is involved in her care - primary caregiver.  [facility] from [facility] toprovide care 5 days a week and [name] does same during the weekends.   I spent73minutes providing this consultation; time includes chart review and documentation.More than 50% of the time in this consultation was spent on coordinating communication  HISTORY OF PRESENT ILLNESS:Meagan A Fairleyis a 75 y.o.year oldfemalewith multiple medical problems including MS, trigeminal neuralgia right side, HTN, R BKA. Palliative Care was asked to help address goals of care.  CODE STATUS:Full   PPS: 30%  HOSPICE ELIGIBILITY/DIAGNOSIS: TBD  PAST MEDICAL HISTORY:  Past Medical History:  Diagnosis Date  . Abnormality of gait   . Depression   . DVT (deep venous thrombosis) (HCC)    Left leg  . Dyslipidemia   . Fibroids   . Hypertension   . Memory difficulties 12/10/2014  . Multiple sclerosis (HCC)    Right sided weakness and gait disorder, bladder and bowel problems   . Neurogenic bladder   . 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 Overdose of muscle relaxant    Unintentional baclofen overdose   . Trigeminal neuralgia   . Trigeminal neuralgia    Right  . Wound infection 10/12/2018    SOCIAL HX:  Social History   Tobacco Use  . Smoking status: Never Smoker  . Smokeless tobacco: Never Used  Substance Use Topics  . Alcohol use: No  ALLERGIES:  Allergies  Allergen Reactions  . Prednisone     Had gotten thrush  . Shellfish Allergy     Hives      PERTINENT MEDICATIONS:  Outpatient  Encounter Medications as of 10/12/2020  Medication Sig  . acetaminophen (TYLENOL) 500 MG tablet Take 500 mg by mouth every 6 (six) hours as needed for mild pain.   Marland Kitchen amLODipine (NORVASC) 10 MG tablet Take 1 tablet (10 mg total) by mouth daily.  . APPLE CIDER VINEGAR PO Take 1 capsule by mouth daily. 450mg   . atorvastatin (LIPITOR) 40 MG tablet Take 40 mg by mouth daily.    . B Complex-C (B-COMPLEX WITH VITAMIN C) tablet Take 1 tablet by mouth daily.  . baclofen (LIORESAL) 10 MG tablet TAKE 1 TABLET (10 MG TOTAL) BY MOUTH 4 (FOUR) TIMES DAILY.  Marland Kitchen buPROPion (WELLBUTRIN XL) 300 MG 24 hr tablet Take 300 mg by mouth daily.  . carbamazepine (TEGRETOL) 200 MG tablet TAKE 1 TABLET BY MOUTH 3 TIMES A DAY  . Cholecalciferol (CVS VIT D 5000 HIGH-POTENCY PO) Take 5,000 Int'l Units/L by mouth daily.  Marland Kitchen CRANBERRY PO Take by mouth.  . Diaper Rash Products (DESITIN CLEAR EX) Apply topically.  . docusate sodium (COLACE) 100 MG capsule Take 1 capsule (100 mg total) by mouth 2 (two) times daily.  Marland Kitchen donepezil (ARICEPT) 5 MG tablet Take 1 tablet (5 mg total) by mouth daily.  Marland Kitchen FIBER SELECT GUMMIES PO Take 5 g by mouth 2 (two) times daily.   . fish oil-omega-3 fatty acids 1000 MG capsule Take 2 g by mouth 3 (three) times daily.  . hydrALAZINE (APRESOLINE) 10 MG tablet   . hydrochlorothiazide (HYDRODIURIL) 25 MG tablet TAKE 1 TABLET BY MOUTH EVERY DAY IN THE MORNING  . Interferon Beta-1a (AVONEX PEN) 30 MCG/0.5ML AJKT Inject 30 mcg into the muscle every 7 (seven) days.  Marland Kitchen labetalol (NORMODYNE) 100 MG tablet Take 100 mg by mouth 2 (two) times daily.  . Multiple Vitamins-Minerals (MULTIVITAMINS THER. W/MINERALS) TABS Take 1 tablet by mouth daily.    . Potassium Chloride (KLOR-CON PO) Take 20 mEq by mouth 3 (three) times daily.   . pregabalin (LYRICA) 100 MG capsule Take 1 capsule (100 mg total) by mouth in the morning. Take in AM  . pregabalin (LYRICA) 75 MG capsule Take 1 capsule (75 mg total) by mouth 2 (two) times  daily.  . pregabalin (LYRICA) 75 MG capsule Take 1 capsule (75 mg total) by mouth every evening.  . Probiotic Product (PROBIOTIC PO) Take by mouth.  . sertraline (ZOLOFT) 50 MG tablet Take 75 mg by mouth daily. One and one half tablet once a day in morning.   . Skin Protectants, Misc. (WHITE PETROLATUM-ZINC OXIDE) cream Apply topically as needed. (Patient taking differently: Apply 1 application topically daily as needed for dry skin or wound care. )  . traMADol (ULTRAM) 50 MG tablet Take 1 tablet (50 mg total) by mouth every 6 (six) hours as needed for moderate pain.  . TURMERIC PO Take 450 mg by mouth.   No facility-administered encounter medications on file as of 10/12/2020.    Note:  Portions of this note were generated with Lobbyist. Dictation errors may occur despite attempts at proofreading.  Teodoro Spray, NP

## 2020-10-16 DIAGNOSIS — G35 Multiple sclerosis: Secondary | ICD-10-CM | POA: Diagnosis not present

## 2020-10-16 DIAGNOSIS — G309 Alzheimer's disease, unspecified: Secondary | ICD-10-CM | POA: Diagnosis not present

## 2020-10-16 DIAGNOSIS — I739 Peripheral vascular disease, unspecified: Secondary | ICD-10-CM | POA: Diagnosis not present

## 2020-10-16 DIAGNOSIS — I10 Essential (primary) hypertension: Secondary | ICD-10-CM | POA: Diagnosis not present

## 2020-10-16 DIAGNOSIS — L89312 Pressure ulcer of right buttock, stage 2: Secondary | ICD-10-CM | POA: Diagnosis not present

## 2020-10-16 DIAGNOSIS — F419 Anxiety disorder, unspecified: Secondary | ICD-10-CM | POA: Diagnosis not present

## 2020-10-16 DIAGNOSIS — L89322 Pressure ulcer of left buttock, stage 2: Secondary | ICD-10-CM | POA: Diagnosis not present

## 2020-10-16 DIAGNOSIS — F0281 Dementia in other diseases classified elsewhere with behavioral disturbance: Secondary | ICD-10-CM | POA: Diagnosis not present

## 2020-10-16 DIAGNOSIS — F325 Major depressive disorder, single episode, in full remission: Secondary | ICD-10-CM | POA: Diagnosis not present

## 2020-10-21 DIAGNOSIS — G309 Alzheimer's disease, unspecified: Secondary | ICD-10-CM | POA: Diagnosis not present

## 2020-10-21 DIAGNOSIS — L89322 Pressure ulcer of left buttock, stage 2: Secondary | ICD-10-CM | POA: Diagnosis not present

## 2020-10-21 DIAGNOSIS — G35 Multiple sclerosis: Secondary | ICD-10-CM | POA: Diagnosis not present

## 2020-10-21 DIAGNOSIS — L89312 Pressure ulcer of right buttock, stage 2: Secondary | ICD-10-CM | POA: Diagnosis not present

## 2020-10-21 DIAGNOSIS — F0281 Dementia in other diseases classified elsewhere with behavioral disturbance: Secondary | ICD-10-CM | POA: Diagnosis not present

## 2020-10-21 DIAGNOSIS — F419 Anxiety disorder, unspecified: Secondary | ICD-10-CM | POA: Diagnosis not present

## 2020-10-21 DIAGNOSIS — F325 Major depressive disorder, single episode, in full remission: Secondary | ICD-10-CM | POA: Diagnosis not present

## 2020-10-21 DIAGNOSIS — I739 Peripheral vascular disease, unspecified: Secondary | ICD-10-CM | POA: Diagnosis not present

## 2020-10-21 DIAGNOSIS — I10 Essential (primary) hypertension: Secondary | ICD-10-CM | POA: Diagnosis not present

## 2020-11-05 DIAGNOSIS — G35 Multiple sclerosis: Secondary | ICD-10-CM | POA: Diagnosis not present

## 2020-11-05 DIAGNOSIS — I739 Peripheral vascular disease, unspecified: Secondary | ICD-10-CM | POA: Diagnosis not present

## 2020-11-05 DIAGNOSIS — L89312 Pressure ulcer of right buttock, stage 2: Secondary | ICD-10-CM | POA: Diagnosis not present

## 2020-11-05 DIAGNOSIS — F419 Anxiety disorder, unspecified: Secondary | ICD-10-CM | POA: Diagnosis not present

## 2020-11-05 DIAGNOSIS — I10 Essential (primary) hypertension: Secondary | ICD-10-CM | POA: Diagnosis not present

## 2020-11-05 DIAGNOSIS — L89322 Pressure ulcer of left buttock, stage 2: Secondary | ICD-10-CM | POA: Diagnosis not present

## 2020-11-05 DIAGNOSIS — F0281 Dementia in other diseases classified elsewhere with behavioral disturbance: Secondary | ICD-10-CM | POA: Diagnosis not present

## 2020-11-05 DIAGNOSIS — F325 Major depressive disorder, single episode, in full remission: Secondary | ICD-10-CM | POA: Diagnosis not present

## 2020-11-05 DIAGNOSIS — G309 Alzheimer's disease, unspecified: Secondary | ICD-10-CM | POA: Diagnosis not present

## 2020-11-09 DIAGNOSIS — I10 Essential (primary) hypertension: Secondary | ICD-10-CM | POA: Diagnosis not present

## 2020-11-09 DIAGNOSIS — G309 Alzheimer's disease, unspecified: Secondary | ICD-10-CM | POA: Diagnosis not present

## 2020-11-09 DIAGNOSIS — F419 Anxiety disorder, unspecified: Secondary | ICD-10-CM | POA: Diagnosis not present

## 2020-11-09 DIAGNOSIS — F325 Major depressive disorder, single episode, in full remission: Secondary | ICD-10-CM | POA: Diagnosis not present

## 2020-11-09 DIAGNOSIS — I739 Peripheral vascular disease, unspecified: Secondary | ICD-10-CM | POA: Diagnosis not present

## 2020-11-09 DIAGNOSIS — L89322 Pressure ulcer of left buttock, stage 2: Secondary | ICD-10-CM | POA: Diagnosis not present

## 2020-11-09 DIAGNOSIS — F0281 Dementia in other diseases classified elsewhere with behavioral disturbance: Secondary | ICD-10-CM | POA: Diagnosis not present

## 2020-11-09 DIAGNOSIS — L89312 Pressure ulcer of right buttock, stage 2: Secondary | ICD-10-CM | POA: Diagnosis not present

## 2020-11-09 DIAGNOSIS — G35 Multiple sclerosis: Secondary | ICD-10-CM | POA: Diagnosis not present

## 2020-11-12 DIAGNOSIS — G35 Multiple sclerosis: Secondary | ICD-10-CM | POA: Diagnosis not present

## 2020-11-12 DIAGNOSIS — L89322 Pressure ulcer of left buttock, stage 2: Secondary | ICD-10-CM | POA: Diagnosis not present

## 2020-11-12 DIAGNOSIS — I10 Essential (primary) hypertension: Secondary | ICD-10-CM | POA: Diagnosis not present

## 2020-11-12 DIAGNOSIS — L89312 Pressure ulcer of right buttock, stage 2: Secondary | ICD-10-CM | POA: Diagnosis not present

## 2020-11-12 DIAGNOSIS — I739 Peripheral vascular disease, unspecified: Secondary | ICD-10-CM | POA: Diagnosis not present

## 2020-11-12 DIAGNOSIS — G309 Alzheimer's disease, unspecified: Secondary | ICD-10-CM | POA: Diagnosis not present

## 2020-11-12 DIAGNOSIS — F325 Major depressive disorder, single episode, in full remission: Secondary | ICD-10-CM | POA: Diagnosis not present

## 2020-11-12 DIAGNOSIS — F0281 Dementia in other diseases classified elsewhere with behavioral disturbance: Secondary | ICD-10-CM | POA: Diagnosis not present

## 2020-11-12 DIAGNOSIS — F419 Anxiety disorder, unspecified: Secondary | ICD-10-CM | POA: Diagnosis not present

## 2020-11-16 DIAGNOSIS — Z89511 Acquired absence of right leg below knee: Secondary | ICD-10-CM | POA: Diagnosis not present

## 2020-11-16 DIAGNOSIS — I10 Essential (primary) hypertension: Secondary | ICD-10-CM | POA: Diagnosis not present

## 2020-11-16 DIAGNOSIS — L89152 Pressure ulcer of sacral region, stage 2: Secondary | ICD-10-CM | POA: Diagnosis not present

## 2020-11-19 DIAGNOSIS — G309 Alzheimer's disease, unspecified: Secondary | ICD-10-CM | POA: Diagnosis not present

## 2020-11-19 DIAGNOSIS — L89322 Pressure ulcer of left buttock, stage 2: Secondary | ICD-10-CM | POA: Diagnosis not present

## 2020-11-19 DIAGNOSIS — L89312 Pressure ulcer of right buttock, stage 2: Secondary | ICD-10-CM | POA: Diagnosis not present

## 2020-11-19 DIAGNOSIS — I739 Peripheral vascular disease, unspecified: Secondary | ICD-10-CM | POA: Diagnosis not present

## 2020-11-19 DIAGNOSIS — F419 Anxiety disorder, unspecified: Secondary | ICD-10-CM | POA: Diagnosis not present

## 2020-11-19 DIAGNOSIS — G35 Multiple sclerosis: Secondary | ICD-10-CM | POA: Diagnosis not present

## 2020-11-19 DIAGNOSIS — F325 Major depressive disorder, single episode, in full remission: Secondary | ICD-10-CM | POA: Diagnosis not present

## 2020-11-19 DIAGNOSIS — F0281 Dementia in other diseases classified elsewhere with behavioral disturbance: Secondary | ICD-10-CM | POA: Diagnosis not present

## 2020-11-19 DIAGNOSIS — I10 Essential (primary) hypertension: Secondary | ICD-10-CM | POA: Diagnosis not present

## 2020-11-23 ENCOUNTER — Other Ambulatory Visit: Payer: Self-pay | Admitting: Neurology

## 2020-11-24 ENCOUNTER — Other Ambulatory Visit: Payer: Medicare HMO | Admitting: Hospice

## 2020-11-24 ENCOUNTER — Other Ambulatory Visit: Payer: Self-pay

## 2020-11-24 DIAGNOSIS — G35 Multiple sclerosis: Secondary | ICD-10-CM

## 2020-11-24 DIAGNOSIS — Z515 Encounter for palliative care: Secondary | ICD-10-CM

## 2020-11-24 DIAGNOSIS — M792 Neuralgia and neuritis, unspecified: Secondary | ICD-10-CM

## 2020-11-24 DIAGNOSIS — G500 Trigeminal neuralgia: Secondary | ICD-10-CM

## 2020-11-24 NOTE — Progress Notes (Signed)
[facility] Consult Note Telephone: [phone]  Fax: [phone]  PATIENT NAME: TEKESHA ALMGREN DOB: 1946-04-18 MRN: [id number]  PRIMARY CARE PROVIDER:   Glenis Smoker, MD Glenis Smoker, MD [place],  [place] [address]  [place] MD  [place], son [phone]  TELEHEALTH VISIT STATEMENT Due to the COVID-19 crisis, this visit was done via telephone from my office. It was initiated and consented to by this patient and/or family.  Visit is to build trust and highlight Palliative Medicine as specialized medical care for people living with serious illness, aimed at facilitating better quality of life through symptoms relief, assisting with advance care plan and establishing goals of care.  [name] continues to request telehealth medicine at this time.  CHIEF COMPLAINT: Follow up palliative visit/neuropathic pain  RECOMMENDATIONS/PLAN:   1. Advance Care Planning/Code Status: CODE STATUS reviewed. [name] affirmed patient remains a full code  2. Goals of Care: Goals of care include to maximize quality of life and symptom management.  Palliative care team will continue to support patient, patient's family, and medical team.  I spent 16 minutes providing this consultation. More than 50% of the time in this consultation was spent on coordinating communication.  -------------------------------------------------------------------------------------------------------------------------------------------------- 3. Symptom management/Plan:  Neuropathic pain related to multiple sclerosis and trigeminal neuralgia.  Continue Lyrica as currently ordered.  Effective. Palliative will continue to monitor for symptom management/decline and make recommendations as needed. Return 2 months or prn. Encouraged to call provider sooner with any concerns.   HISTORY OF PRESENT ILLNESS:   GRAYCEE GREESON is a 75 y.o. female with multiple medical problems including chronic neuropathic pain in all extremities likely related to history of multiple sclerosis and trigeminal neuralgia, worse at night and helped with current dose of Lyrica.  Patient continues to be bedbound, requiring extensive assistance in activities of daily living.  History of hypertension, right BKA. History obtained from review of EMR, discussion with patient/family. Review and summarization of Epic records shows history from other than patient. Rest of 10 point ROS asked and negative. Palliative Care was asked to follow this patient by consultation request of Maurice Small MD to help address complex decision making in the context of goals of care.   CODE STATUS: Full code  PPS: 30%  HOSPICE ELIGIBILITY/DIAGNOSIS: TBD  PAST MEDICAL HISTORY:  Past Medical History:  Diagnosis Date  . Abnormality of gait   . Depression   . DVT (deep venous thrombosis) (HCC)    Left leg  . Dyslipidemia   . Fibroids   . Hypertension   . Memory difficulties 12/10/2014  . Multiple sclerosis (HCC)    Right sided weakness and gait disorder, bladder and bowel problems   . Neurogenic bladder   . 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 Overdose of muscle relaxant    Unintentional baclofen overdose   . Trigeminal neuralgia   . Trigeminal neuralgia    Right  . Wound infection 10/12/2018    SOCIAL HX: @SOCX  Patient lives at home with her son for ongoing care   FAMILY HX:  Family History  Problem Relation Age of Onset  . Multiple sclerosis Sister   . Cervical cancer Mother   . Liver disease Father   . Hyperlipidemia Brother   . Pancreatitis Brother   . Hypertension Other   . Heart disease Other   . Uterine cancer Other     Review lab  tests/diagnostics No results for input(s): WBC, HGB, HCT, PLT, MCV in the last 168 hours. No results for input(s): NA, K, CL, CO2, BUN, CREATININE, GLUCOSE in the last 168 hours. Latest GFR  by Cockcroft Gault (not valid in AKI or ESRD) CrCl cannot be calculated (Patient's most recent lab result is older than the maximum 21 days allowed.). No results for input(s): AST, ALT, ALKPHOS, GGT in the last 168 hours.  Invalid input(s): TBILI, CONJBILI, ALB, TOTALPROTEIN No components found for: ALB No results for input(s): APTT, INR in the last 168 hours.  Invalid input(s): PTPATIENT No results for input(s): BNP, PROBNP in the last 168 hours.  ALLERGIES:  Allergies  Allergen Reactions  . Prednisone     Had gotten thrush  . Shellfish Allergy     Hives       PERTINENT MEDICATIONS:  Outpatient Encounter Medications as of 11/24/2020  Medication Sig  . acetaminophen (TYLENOL) 500 MG tablet Take 500 mg by mouth every 6 (six) hours as needed for mild pain.   Marland Kitchen amLODipine (NORVASC) 10 MG tablet Take 1 tablet (10 mg total) by mouth daily.  . APPLE CIDER VINEGAR PO Take 1 capsule by mouth daily. 450mg   . atorvastatin (LIPITOR) 40 MG tablet Take 40 mg by mouth daily.    . B Complex-C (B-COMPLEX WITH VITAMIN C) tablet Take 1 tablet by mouth daily.  . baclofen (LIORESAL) 10 MG tablet TAKE 1 TABLET (10 MG TOTAL) BY MOUTH 4 (FOUR) TIMES DAILY.  Marland Kitchen buPROPion (WELLBUTRIN XL) 300 MG 24 hr tablet Take 300 mg by mouth daily.  . carbamazepine (TEGRETOL) 200 MG tablet TAKE 1 TABLET BY MOUTH 3 TIMES A DAY  . Cholecalciferol (CVS VIT D 5000 HIGH-POTENCY PO) Take 5,000 Int'l Units/L by mouth daily.  Marland Kitchen CRANBERRY PO Take by mouth.  . Diaper Rash Products (DESITIN CLEAR EX) Apply topically.  . docusate sodium (COLACE) 100 MG capsule Take 1 capsule (100 mg total) by mouth 2 (two) times daily.  Marland Kitchen donepezil (ARICEPT) 5 MG tablet Take 1 tablet (5 mg total) by mouth daily.  Marland Kitchen FIBER SELECT GUMMIES PO Take 5 g by mouth 2 (two) times daily.   . fish oil-omega-3 fatty acids 1000 MG capsule Take 2 g by mouth 3 (three) times daily.  . hydrALAZINE (APRESOLINE) 10 MG tablet   . hydrochlorothiazide (HYDRODIURIL) 25  MG tablet TAKE 1 TABLET BY MOUTH EVERY DAY IN THE MORNING  . Interferon Beta-1a (AVONEX PEN) 30 MCG/0.5ML AJKT Inject 30 mcg into the muscle every 7 (seven) days.  Marland Kitchen labetalol (NORMODYNE) 100 MG tablet Take 100 mg by mouth 2 (two) times daily.  . Multiple Vitamins-Minerals (MULTIVITAMINS THER. W/MINERALS) TABS Take 1 tablet by mouth daily.    . Potassium Chloride (KLOR-CON PO) Take 20 mEq by mouth 3 (three) times daily.   . pregabalin (LYRICA) 100 MG capsule Take 1 capsule (100 mg total) by mouth in the morning. Take in AM  . pregabalin (LYRICA) 75 MG capsule Take 1 capsule (75 mg total) by mouth 2 (two) times daily.  . pregabalin (LYRICA) 75 MG capsule Take 1 capsule (75 mg total) by mouth every evening.  . Probiotic Product (PROBIOTIC PO) Take by mouth.  . sertraline (ZOLOFT) 50 MG tablet Take 75 mg by mouth daily. One and one half tablet once a day in morning.   . Skin Protectants, Misc. (WHITE PETROLATUM-ZINC OXIDE) cream Apply topically as needed. (Patient taking differently: Apply 1 application topically daily as needed for dry skin or  wound care. )  . traMADol (ULTRAM) 50 MG tablet Take 1 tablet (50 mg total) by mouth every 6 (six) hours as needed for moderate pain.  . TURMERIC PO Take 450 mg by mouth.   No facility-administered encounter medications on file as of 11/24/2020.   Physical exam deferred due to telehealth medicine visit.  Thank you for the opportunity to participate in the care of LIAM BOSSMAN Please call our office at [phone] if we can be of additional assistance.  Note: Portions of this note were generated with Lobbyist. Dictation errors may occur despite best attempts at proofreading.  Teodoro Spray, NP

## 2020-11-26 DIAGNOSIS — G35 Multiple sclerosis: Secondary | ICD-10-CM | POA: Diagnosis not present

## 2020-11-26 DIAGNOSIS — L89322 Pressure ulcer of left buttock, stage 2: Secondary | ICD-10-CM | POA: Diagnosis not present

## 2020-11-26 DIAGNOSIS — G309 Alzheimer's disease, unspecified: Secondary | ICD-10-CM | POA: Diagnosis not present

## 2020-11-26 DIAGNOSIS — L89312 Pressure ulcer of right buttock, stage 2: Secondary | ICD-10-CM | POA: Diagnosis not present

## 2020-11-26 DIAGNOSIS — I739 Peripheral vascular disease, unspecified: Secondary | ICD-10-CM | POA: Diagnosis not present

## 2020-11-26 DIAGNOSIS — I10 Essential (primary) hypertension: Secondary | ICD-10-CM | POA: Diagnosis not present

## 2020-11-26 DIAGNOSIS — F325 Major depressive disorder, single episode, in full remission: Secondary | ICD-10-CM | POA: Diagnosis not present

## 2020-11-26 DIAGNOSIS — F0281 Dementia in other diseases classified elsewhere with behavioral disturbance: Secondary | ICD-10-CM | POA: Diagnosis not present

## 2020-11-26 DIAGNOSIS — F419 Anxiety disorder, unspecified: Secondary | ICD-10-CM | POA: Diagnosis not present

## 2020-12-03 DIAGNOSIS — L89322 Pressure ulcer of left buttock, stage 2: Secondary | ICD-10-CM | POA: Diagnosis not present

## 2020-12-03 DIAGNOSIS — F419 Anxiety disorder, unspecified: Secondary | ICD-10-CM | POA: Diagnosis not present

## 2020-12-03 DIAGNOSIS — G309 Alzheimer's disease, unspecified: Secondary | ICD-10-CM | POA: Diagnosis not present

## 2020-12-03 DIAGNOSIS — I10 Essential (primary) hypertension: Secondary | ICD-10-CM | POA: Diagnosis not present

## 2020-12-03 DIAGNOSIS — I739 Peripheral vascular disease, unspecified: Secondary | ICD-10-CM | POA: Diagnosis not present

## 2020-12-03 DIAGNOSIS — F0281 Dementia in other diseases classified elsewhere with behavioral disturbance: Secondary | ICD-10-CM | POA: Diagnosis not present

## 2020-12-03 DIAGNOSIS — G35 Multiple sclerosis: Secondary | ICD-10-CM | POA: Diagnosis not present

## 2020-12-03 DIAGNOSIS — L89312 Pressure ulcer of right buttock, stage 2: Secondary | ICD-10-CM | POA: Diagnosis not present

## 2020-12-03 DIAGNOSIS — F325 Major depressive disorder, single episode, in full remission: Secondary | ICD-10-CM | POA: Diagnosis not present

## 2020-12-10 DIAGNOSIS — G35 Multiple sclerosis: Secondary | ICD-10-CM | POA: Diagnosis not present

## 2020-12-10 DIAGNOSIS — F419 Anxiety disorder, unspecified: Secondary | ICD-10-CM | POA: Diagnosis not present

## 2020-12-10 DIAGNOSIS — L89312 Pressure ulcer of right buttock, stage 2: Secondary | ICD-10-CM | POA: Diagnosis not present

## 2020-12-10 DIAGNOSIS — F325 Major depressive disorder, single episode, in full remission: Secondary | ICD-10-CM | POA: Diagnosis not present

## 2020-12-10 DIAGNOSIS — G309 Alzheimer's disease, unspecified: Secondary | ICD-10-CM | POA: Diagnosis not present

## 2020-12-10 DIAGNOSIS — L89322 Pressure ulcer of left buttock, stage 2: Secondary | ICD-10-CM | POA: Diagnosis not present

## 2020-12-10 DIAGNOSIS — F0281 Dementia in other diseases classified elsewhere with behavioral disturbance: Secondary | ICD-10-CM | POA: Diagnosis not present

## 2020-12-10 DIAGNOSIS — I10 Essential (primary) hypertension: Secondary | ICD-10-CM | POA: Diagnosis not present

## 2020-12-10 DIAGNOSIS — I739 Peripheral vascular disease, unspecified: Secondary | ICD-10-CM | POA: Diagnosis not present

## 2020-12-16 ENCOUNTER — Other Ambulatory Visit: Payer: Self-pay | Admitting: Neurology

## 2020-12-21 ENCOUNTER — Other Ambulatory Visit: Payer: Self-pay | Admitting: Neurology

## 2020-12-21 MED ORDER — TRAMADOL HCL 50 MG PO TABS
50.0000 mg | ORAL_TABLET | Freq: Four times a day (QID) | ORAL | 0 refills | Status: DC | PRN
Start: 2020-12-21 — End: 2021-01-04

## 2020-12-21 MED ORDER — PREGABALIN 100 MG PO CAPS: 100.0000 mg | ORAL_CAPSULE | Freq: Two times a day (BID) | ORAL | 0 refills | Status: DC

## 2020-12-21 NOTE — Telephone Encounter (Signed)
LMVM for [name] son of pt to return call re: pt TN pain.

## 2020-12-21 NOTE — Telephone Encounter (Signed)
Pt.'s son [name] is on [place]. He states that mom's nerve pain flared up over the weekend & it was rough. He states she had pain in several places & he wants to discuss possible solutions. Please advise.

## 2020-12-21 NOTE — Addendum Note (Signed)
Addended by: Brandon Melnick on: 12/21/2020 04:35 PM   Modules accepted: Orders

## 2020-12-21 NOTE — Telephone Encounter (Signed)
[name], son call back and pt has started with TN Flare,  [name], last week, worse weekend,  Taking carbamazepine 200mg  po tid (did increase by 1/2 tablet in am, which he felt helped).  Taking lyrica 75mg  am and 100mg  pm.  Tramadol and tyenol prn.  Has appt 01-04-21 Dr. Jannifer Franklin.  Please advise.

## 2020-12-21 NOTE — Telephone Encounter (Signed)
Spoke to [place],  Pt will increase lyrica to 100mg  po bid.  Has one prescription left of #30 (last filled 11-23-20 #30).  Has appt with Dr. Jannifer Franklin on 01-04-21

## 2020-12-21 NOTE — Telephone Encounter (Signed)
LMVM for son to return call re: pt TN flare. lyrica 100mg  am, 75mg  po pm, Carbamazepine 200mg  TID.

## 2020-12-21 NOTE — Telephone Encounter (Signed)
Check with [name], if she can tolerate short term increase of Lyrica back up to 100 mg twice daily, this may help in the interim. We have tried to lower the dose due to potentially being overmedicated. I would be careful with too much extra carbamazepine, last blood level was high end of normal at 12.0. Can continue the Tramadol PRN.

## 2020-12-24 ENCOUNTER — Telehealth: Payer: Self-pay | Admitting: Neurology

## 2020-12-24 NOTE — Telephone Encounter (Signed)
Pt's son(on DPR) is asking for a call to discuss the increase of Lyrica has not helped pt's pain

## 2020-12-24 NOTE — Telephone Encounter (Signed)
Called [name] who stated she has only had one day of Lyrica 100 mg twice a day. I advised he give it a few more days. He stated she complained yesterday but hasn't today. She has FU on 01/04/21. I advised if she is not better next week to call back. Otherwise he can discuss medication in her FU. He did state he hates to see her get "drugged up and be drowsy again". Son verbalized understanding, appreciation.

## 2021-01-04 ENCOUNTER — Ambulatory Visit: Payer: Medicare HMO | Admitting: Neurology

## 2021-01-04 ENCOUNTER — Encounter: Payer: Self-pay | Admitting: Neurology

## 2021-01-04 ENCOUNTER — Other Ambulatory Visit: Payer: Self-pay

## 2021-01-04 VITALS — BP 155/76 | HR 81 | Ht 65.0 in

## 2021-01-04 DIAGNOSIS — R269 Unspecified abnormalities of gait and mobility: Secondary | ICD-10-CM | POA: Diagnosis not present

## 2021-01-04 DIAGNOSIS — G500 Trigeminal neuralgia: Secondary | ICD-10-CM | POA: Diagnosis not present

## 2021-01-04 DIAGNOSIS — G35 Multiple sclerosis: Secondary | ICD-10-CM

## 2021-01-04 DIAGNOSIS — F09 Unspecified mental disorder due to known physiological condition: Secondary | ICD-10-CM | POA: Diagnosis not present

## 2021-01-04 DIAGNOSIS — Z5181 Encounter for therapeutic drug level monitoring: Secondary | ICD-10-CM

## 2021-01-04 MED ORDER — TRAMADOL HCL 50 MG PO TABS
50.0000 mg | ORAL_TABLET | Freq: Four times a day (QID) | ORAL | 0 refills | Status: DC | PRN
Start: 2021-01-04 — End: 2021-02-23

## 2021-01-04 MED ORDER — LAMOTRIGINE 25 MG PO TABS: ORAL_TABLET | ORAL | 3 refills | Status: DC

## 2021-01-04 NOTE — Progress Notes (Signed)
Reason for visit: Multiple sclerosis, dementia, gait disorder  [name] is an 75 y.o. female  History of present illness:  [name] is a 75 year old right-handed black female with a history of multiple sclerosis with end-stage features.  The patient is on Avonex, she has a significant dementia.  She has a problem with severe trigeminal neuralgia affecting the right face, she has had worsening of her symptoms over the last several weeks.  She has had gamma knife treatments in the past that have not been very effective.  Carbamazepine has been the most effective medication, Lyrica has been added to this but seems to result in a lot of drowsiness but it does help control the pain.  The patient is now eating soft foods because of the pain.  She has not had recent falls but she does not ambulate.  Her son comes in with her today, he is taking care of her on a regular basis.  The patient requires assistance with bathing and dressing.  Past Medical History:  Diagnosis Date  . Abnormality of gait   . Depression   . DVT (deep venous thrombosis) (HCC)    Left leg  . Dyslipidemia   . Fibroids   . Hypertension   . Memory difficulties 12/10/2014  . Multiple sclerosis (HCC)    Right sided weakness and gait disorder, bladder and bowel problems   . Neurogenic bladder   . 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 Overdose of muscle relaxant    Unintentional baclofen overdose   . Trigeminal neuralgia   . Trigeminal neuralgia    Right  . Wound infection 10/12/2018    Past Surgical History:  Procedure Laterality Date  . AMPUTATION Right 10/17/2018   Procedure: RIGHT BELOW KNEE AMPUTATION;  Surgeon: [name], MD;  Location: [facility];  Service: Orthopedics;  Laterality: Right;  . gamma knife procedure for rt. trigeminal neuralgia Right   . left leg DVT Left   . STRABISMUS SURGERY     Right eye     Family History  Problem Relation Age of Onset  . Multiple sclerosis Sister   .  Cervical cancer Mother   . Liver disease Father   . Hyperlipidemia Brother   . Pancreatitis Brother   . Hypertension Other   . Heart disease Other   . Uterine cancer Other     Social history:  reports that she has never smoked. She has never used smokeless tobacco. She reports that she does not drink alcohol and does not use drugs.    Allergies  Allergen Reactions  . Prednisone     Had gotten thrush  . Shellfish Allergy     Hives     Medications:  Prior to Admission medications   Medication Sig Start Date End Date Taking? Authorizing Provider  acetaminophen (TYLENOL) 500 MG tablet Take 500 mg by mouth every 6 (six) hours as needed for mild pain.    Yes [provider]  amLODipine (NORVASC) 10 MG tablet Take 1 tablet (10 mg total) by mouth daily. 11/03/11  Yes Deneise Lever, MD  APPLE CIDER VINEGAR PO Take 1 capsule by mouth daily. 450mg    Yes [provider]  atorvastatin (LIPITOR) 40 MG tablet Take 40 mg by mouth daily.   Yes [provider]  B Complex-C (B-COMPLEX WITH VITAMIN C) tablet Take 1 tablet by mouth daily.   Yes [provider]  baclofen (LIORESAL) 10 MG  tablet TAKE 1 TABLET (10 MG TOTAL) BY MOUTH 4 (FOUR) TIMES DAILY. 12/17/20  Yes Suzzanne Cloud, NP  buPROPion (WELLBUTRIN XL) 300 MG 24 hr tablet Take 300 mg by mouth daily.   Yes Deneise Lever, MD  carbamazepine (TEGRETOL) 200 MG tablet TAKE 1 TABLET BY MOUTH 3 TIMES A DAY 06/17/20  Yes Suzzanne Cloud, NP  Cholecalciferol (CVS VIT D 5000 HIGH-POTENCY PO) Take 5,000 Int'l Units/L by mouth daily.   Yes [provider]  CRANBERRY PO Take by mouth.   Yes [provider]  Diaper Rash Products (DESITIN CLEAR EX) Apply topically.   Yes [provider]  docusate sodium (COLACE) 100 MG capsule Take 1 capsule (100 mg total) by mouth 2 (two) times daily. 10/24/18  Yes Mikhail, Maryann, DO  donepezil (ARICEPT) 5 MG tablet Take 1 tablet (5 mg total) by mouth daily.  06/16/20  Yes Suzzanne Cloud, NP  FIBER SELECT GUMMIES PO Take 5 g by mouth 2 (two) times daily.    Yes [provider]  fish oil-omega-3 fatty acids 1000 MG capsule Take 2 g by mouth 3 (three) times daily.   Yes Deneise Lever, MD  hydrochlorothiazide (HYDRODIURIL) 25 MG tablet TAKE 1 TABLET BY MOUTH EVERY DAY IN THE MORNING 12/29/18  Yes [provider]  Interferon Beta-1a (AVONEX PEN) 30 MCG/0.5ML AJKT Inject 30 mcg into the muscle every 7 (seven) days. 07/08/20  Yes Suzzanne Cloud, NP  Multiple Vitamins-Minerals (MULTIVITAMINS THER. W/MINERALS) TABS Take 1 tablet by mouth daily.   Yes [provider]  Potassium Chloride (KLOR-CON PO) Take 20 mEq by mouth 3 (three) times daily.    Yes [provider]  pregabalin (LYRICA) 100 MG capsule Take 1 capsule (100 mg total) by mouth 2 (two) times daily. 12/21/20  Yes Suzzanne Cloud, NP  Probiotic Product (PROBIOTIC PO) Take by mouth.   Yes [provider]  sertraline (ZOLOFT) 50 MG tablet Take 75 mg by mouth daily. One and one half tablet once a day in morning.   Yes [provider]  Skin Protectants, Misc. (WHITE PETROLATUM-ZINC OXIDE) cream Apply topically as needed. Patient taking differently: Apply 1 application topically daily as needed for dry skin or wound care. 04/06/17  Yes Lysbeth Penner, FNP  traMADol (ULTRAM) 50 MG tablet Take 1 tablet (50 mg total) by mouth every 6 (six) hours as needed for moderate pain. 12/21/20  Yes Suzzanne Cloud, NP  TURMERIC PO Take 450 mg by mouth.   Yes [provider]    ROS:  Out of a complete 14 system review of symptoms, the patient complains only of the following symptoms, and all other reviewed systems are negative.  Neuralgia pain Weakness Walking problem Memory problems  Blood pressure (!) 155/76, pulse 81, height 5\' 5"  (1.651 m), SpO2 93 %.  Physical Exam  General: The patient is alert and cooperative at the time of the  examination.  Skin: No significant peripheral edema is noted.   Neurologic Exam  Mental status: The patient is sleepy but can be awakened at the time of examination and she will cooperate.  She is oriented to person and place, not date.   Cranial nerves: Facial symmetry is present. Speech is normal, no aphasia or dysarthria is noted. Extraocular movements are full. Visual fields are full.  Motor: The patient has significant weakness of the right upper extremity, 4/5 grip with right hand and 3/5 strength more proximally.  Patient has  4-4+/5 strength in the left upper extremity.  2-3/5 strength in both legs, she has a prosthetic right lower leg.  Sensory examination: Soft touch sensation is symmetric on the face, arms, and legs.  Coordination: The patient has good finger-nose-finger with the left arm, cannot perform on the right.  Gait and station: The patient is unable to ambulate, she is wheelchair-bound.  Reflexes: Deep tendon reflexes are symmetric.   Assessment/Plan:  1.  Multiple sclerosis  2.  Right trigeminal neuralgia  3.  Dementia  4.  Gait disorder  The patient is unable to ambulate at this point.  She is having significant neuralgia pain.  She has significant drowsiness from the Lyrica, but it does require to control her pain.  We will add lamotrigine to see if the pain can be better controlled with less drowsiness.  She will work up to 75 mg twice daily.  The son will call for any dose adjustments.  She will have blood work done today.  She will follow up in 3 to 4 months.  The patient remains on Avonex.  Jill Alexanders MD 01/04/2021 12:18 PM  [facility] [address] [place], [place] [address]  Phone [phone] Fax [phone]

## 2021-01-05 LAB — CBC WITH DIFFERENTIAL/PLATELET
Basophils Absolute: 0 10*3/uL (ref 0.0–0.2)
Basos: 0 %
EOS (ABSOLUTE): 0 10*3/uL (ref 0.0–0.4)
Eos: 1 %
Hematocrit: 40.3 % (ref 34.0–46.6)
Hemoglobin: 13.2 g/dL (ref 11.1–15.9)
Immature Grans (Abs): 0 10*3/uL (ref 0.0–0.1)
Immature Granulocytes: 0 %
Lymphocytes Absolute: 1.4 10*3/uL (ref 0.7–3.1)
Lymphs: 19 %
MCH: 30.3 pg (ref 26.6–33.0)
MCHC: 32.8 g/dL (ref 31.5–35.7)
MCV: 93 fL (ref 79–97)
Monocytes Absolute: 0.6 10*3/uL (ref 0.1–0.9)
Monocytes: 8 %
Neutrophils Absolute: 5.2 10*3/uL (ref 1.4–7.0)
Neutrophils: 72 %
Platelets: 198 10*3/uL (ref 150–450)
RBC: 4.35 x10E6/uL (ref 3.77–5.28)
RDW: 13.9 % (ref 11.7–15.4)
WBC: 7.2 10*3/uL (ref 3.4–10.8)

## 2021-01-05 LAB — COMPREHENSIVE METABOLIC PANEL
ALT: 13 IU/L (ref 0–32)
AST: 16 IU/L (ref 0–40)
Albumin/Globulin Ratio: 1.5 (ref 1.2–2.2)
Albumin: 4.3 g/dL (ref 3.7–4.7)
Alkaline Phosphatase: 109 IU/L (ref 44–121)
BUN/Creatinine Ratio: 25 (ref 12–28)
BUN: 17 mg/dL (ref 8–27)
Bilirubin Total: 0.2 mg/dL (ref 0.0–1.2)
CO2: 25 mmol/L (ref 20–29)
Calcium: 9.8 mg/dL (ref 8.7–10.3)
Chloride: 106 mmol/L (ref 96–106)
Creatinine, Ser: 0.67 mg/dL (ref 0.57–1.00)
Globulin, Total: 2.9 g/dL (ref 1.5–4.5)
Glucose: 94 mg/dL (ref 65–99)
Potassium: 4.2 mmol/L (ref 3.5–5.2)
Sodium: 146 mmol/L — ABNORMAL HIGH (ref 134–144)
Total Protein: 7.2 g/dL (ref 6.0–8.5)
eGFR: 93 mL/min/{1.73_m2} (ref >59)

## 2021-01-05 LAB — CARBAMAZEPINE LEVEL, TOTAL: Carbamazepine (Tegretol), S: 13 ug/mL (ref 4.0–12.0)

## 2021-01-06 ENCOUNTER — Other Ambulatory Visit: Payer: Self-pay | Admitting: Neurology

## 2021-01-07 ENCOUNTER — Telehealth: Payer: Self-pay | Admitting: Neurology

## 2021-01-07 MED ORDER — PREGABALIN 100 MG PO CAPS: 100.0000 mg | ORAL_CAPSULE | Freq: Two times a day (BID) | ORAL | 1 refills | Status: DC

## 2021-01-07 NOTE — Telephone Encounter (Signed)
I called the son.  The Lyrica prescription has run out, the pharmacy would not refill it and I am not sure why.  I will send in another prescription for 90-day supply.

## 2021-01-07 NOTE — Telephone Encounter (Signed)
Pt's son, Venesha Petraitis called, her pregabalin (LYRICA) 100 MG capsule refill was refused. Would like a call from the nurse.

## 2021-01-11 ENCOUNTER — Other Ambulatory Visit: Payer: Self-pay | Admitting: Neurology

## 2021-02-15 ENCOUNTER — Other Ambulatory Visit: Payer: Self-pay

## 2021-02-15 ENCOUNTER — Other Ambulatory Visit: Payer: Medicare HMO | Admitting: Hospice

## 2021-02-15 DIAGNOSIS — M792 Neuralgia and neuritis, unspecified: Secondary | ICD-10-CM | POA: Diagnosis not present

## 2021-02-15 DIAGNOSIS — R413 Other amnesia: Secondary | ICD-10-CM | POA: Diagnosis not present

## 2021-02-15 DIAGNOSIS — Z515 Encounter for palliative care: Secondary | ICD-10-CM

## 2021-02-15 DIAGNOSIS — I10 Essential (primary) hypertension: Secondary | ICD-10-CM

## 2021-02-15 NOTE — Progress Notes (Signed)
[facility] Consult Note Telephone: [phone]  Fax: [phone]  PATIENT NAME: [name] DOB: Apr 19, 1946 MRN: [id number]  PRIMARY CARE PROVIDER:   Glenis Smoker, MD Glenis Smoker, MD [place],  [place] [address]  REFERRING PROVIDER: Glenis Smoker, MD Glenis Smoker, MD [place],  [place] [address]  Contact Information    Name Relation Home Work [place] Son [phone]  [phone]     Binger, son [phone]  TELEHEALTH VISIT STATEMENT Due to the COVID-19 crisis, this visit was done via telephone from my office. It was initiated and consented to by this patient and/or family.   Visit is to build trust and highlight Palliative Medicine as specialized medical care for people living with serious illness, aimed at facilitating better quality of life through symptoms relief, assisting with advance care planning and complex medical decision making.   RECOMMENDATIONS/PLAN:   Advance Care Planning/Code Status: Patient is a Full code  Goals of Care: Goals of care include to maximize quality of life and symptom management.  Visit consisted of counseling and education dealing with the complex and emotionally intense issues of symptom management and palliative care in the setting of serious and potentially life-threatening illness. Palliative care team will continue to support patient, patient's family, and medical team.  Symptom management/Plan:  HTN: Continue Amlodipine, Losartan and HTCZ. Neuropathic pain: Managed with Lyrica, Tegretol, Tramadol Depression: Zoloft, Bupropion.  Psych consult as needed Memory difficulties: Continue Aricept as ordered.   multiple sclerosis and trigeminal neuralgia, worse at night and helped with current dose of Lyrica.  Patient continues to be bedbound, requiring extensive assistance  in activities of daily living.  Encouraged ongoing supportive care. Follow up: Palliative care will continue to follow for complex medical decision making, advance care planning, and clarification of goals. Return 6 weeks or prn. Encouraged to call provider sooner with any concerns.  CHIEF COMPLAINT: Palliative follow up visit/Hypertension  HISTORY OF PRESENT ILLNESS:  [name] a 75 y.o. female with multiple medical problems including hypertension which is chronic, currently well managed with amlodipine, losartan, and hydrochlorothiazide.  Patient denies headaches palpitations dizziness.  BP readings at home is ongoing; readings within limits set by PCP, per [facility].  History of multiple sclerosis and trigeminal neuralgia,hypertension, right BKA.  History obtained from review of EMR, discussion with primary team, family, caregiver  and/or patient. Records reviewed and summarized above. All 10 point systems reviewed and are negative except as documented in history of present illness above  Review and summarization of Epic records shows history from other than patient.   Palliative Care was asked to follow this patient by consultation request of Glenis Smoker, * to help address complex decision making in the context of advance care planning and goals of care clarification.   PPS: 30% ROS  General: NAD, appropriately dressed Constitution: Denies fever/chills EYES: denies vision changes ENMT: denies Xerostomia, dysphagia  Cardiovascular: denies chest pain Pulmonary: denies  cough, denies dyspnea  Abdomen: endorses fair appetite, denies constipation or diarrhea GU: denies dysuria MSK:  endorses ROM limitations, no falls reported; patient is bedbound Skin: denies rashes/bruising, pressure wound to sacrum is healed Neurological: endorses weakness, occasional neuropathic pain, denies insomnia Psych: Endorses positive mood Heme/lymph/immuno: denies bruises, no abnormal  bleeding  PERTINENT MEDICATIONS:  Outpatient Encounter Medications as of 02/15/2021  Medication Sig  . acetaminophen (TYLENOL) 500 MG tablet Take 500 mg by  mouth every 6 (six) hours as needed for mild pain.   Marland Kitchen amLODipine (NORVASC) 10 MG tablet Take 1 tablet (10 mg total) by mouth daily.  . APPLE CIDER VINEGAR PO Take 1 capsule by mouth daily. 450mg   . atorvastatin (LIPITOR) 40 MG tablet Take 40 mg by mouth daily.  . AVONEX PEN 30 MCG/0.5ML AJKT INJECT 30 MCG INTRAMUSCULARLY ONCE WEEKLY  . B Complex-C (B-COMPLEX WITH VITAMIN C) tablet Take 1 tablet by mouth daily.  . baclofen (LIORESAL) 10 MG tablet TAKE 1 TABLET (10 MG TOTAL) BY MOUTH 4 (FOUR) TIMES DAILY.  Marland Kitchen buPROPion (WELLBUTRIN XL) 300 MG 24 hr tablet Take 300 mg by mouth daily.  . carbamazepine (TEGRETOL) 200 MG tablet TAKE 1 TABLET BY MOUTH 3 TIMES A DAY  . Cholecalciferol (CVS VIT D 5000 HIGH-POTENCY PO) Take 5,000 Int'l Units/L by mouth daily.  Marland Kitchen CRANBERRY PO Take by mouth.  . Diaper Rash Products (DESITIN CLEAR EX) Apply topically.  . docusate sodium (COLACE) 100 MG capsule Take 1 capsule (100 mg total) by mouth 2 (two) times daily.  Marland Kitchen donepezil (ARICEPT) 5 MG tablet Take 1 tablet (5 mg total) by mouth daily.  Marland Kitchen FIBER SELECT GUMMIES PO Take 5 g by mouth 2 (two) times daily.   . fish oil-omega-3 fatty acids 1000 MG capsule Take 2 g by mouth 3 (three) times daily.  . hydrochlorothiazide (HYDRODIURIL) 25 MG tablet TAKE 1 TABLET BY MOUTH EVERY DAY IN THE MORNING  . lamoTRIgine (LAMICTAL) 25 MG tablet One twice a day for 2 weeks, then take 2 tablets twice a day then take 3 tablets twice a day  . Multiple Vitamins-Minerals (MULTIVITAMINS THER. W/MINERALS) TABS Take 1 tablet by mouth daily.  . Potassium Chloride (KLOR-CON PO) Take 20 mEq by mouth 3 (three) times daily.   . pregabalin (LYRICA) 100 MG capsule Take 1 capsule (100 mg total) by mouth 2 (two) times daily.  . Probiotic Product (PROBIOTIC PO) Take by mouth.  . sertraline (ZOLOFT)  50 MG tablet Take 75 mg by mouth daily. One and one half tablet once a day in morning.  . Skin Protectants, Misc. (WHITE PETROLATUM-ZINC OXIDE) cream Apply topically as needed. (Patient taking differently: Apply 1 application topically daily as needed for dry skin or wound care.)  . traMADol (ULTRAM) 50 MG tablet Take 1 tablet (50 mg total) by mouth every 6 (six) hours as needed for moderate pain.  . TURMERIC PO Take 450 mg by mouth.   No facility-administered encounter medications on file as of 02/15/2021.    HOSPICE ELIGIBILITY/DIAGNOSIS: TBD  PAST MEDICAL HISTORY:  Past Medical History:  Diagnosis Date  . Abnormality of gait   . Depression   . DVT (deep venous thrombosis) (HCC)    Left leg  . Dyslipidemia   . Fibroids   . Hypertension   . Memory difficulties 12/10/2014  . Multiple sclerosis (HCC)    Right sided weakness and gait disorder, bladder and bowel problems   . Neurogenic bladder   . 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 Overdose of muscle relaxant    Unintentional baclofen overdose   . Trigeminal neuralgia   . Trigeminal neuralgia    Right  . Wound infection 10/12/2018     SOCIAL HX: @SOCX  Patient lives at home with her son [name]  FAMILY HX:  Family History  Problem Relation Age of Onset  . Multiple sclerosis Sister   . Cervical cancer Mother   . Liver  disease Father   . Hyperlipidemia Brother   . Pancreatitis Brother   . Hypertension Other   . Heart disease Other   . Uterine cancer Other     Review lab tests/diagnostics  ALLERGIES:  Allergies  Allergen Reactions  . Prednisone     Had gotten thrush  . Shellfish Allergy     Hives       I spent  50 minutes providing this consultation; this includes time spent with patient/family, chart review and documentation. More than 50% of the time in this consultation was spent on counseling and coordinating communication   Thank you for the opportunity to participate in the care of LAWANDA HOLZHEIMER Please  call our office at [phone] if we can be of additional assistance.  Note: Portions of this note were generated with Lobbyist. Dictation errors may occur despite best attempts at proofreading.  Teodoro Spray, NP

## 2021-02-22 ENCOUNTER — Other Ambulatory Visit: Payer: Self-pay | Admitting: Neurology

## 2021-04-28 NOTE — Progress Notes (Signed)
PATIENT: [name] DOB: [birth year]  REASON FOR VISIT: follow up HISTORY FROM: patient Primary Neurologist: Dr. Jannifer Franklin   Today 04/29/21 [name] is a 75 year old female here today for follow-up for MS.  She has significant dementia, is on Avonex for MS.  Has trigeminal neuralgia affecting the right face, on Lyrica and carbamazepine.  Lamictal was added at last visit.  Her son reports Lamictal 25 twice a day was too sedating, She is only taking 25 mg daily.  Her facial pain has been well controlled.  He feels she is overmedicated, drowsy, does not have much verbal conversation.  She is currently dealing with a bedsore to her sacral area.  She has a home health aide who comes out.  Is nonambulatory, is wheelchair-bound. At 1 point able to reduce Lyrica 75 mg twice daily, at that dosing she was more alert and interactive.  She is rarely taking tramadol.  She remains on baclofen and Aricept.  Here today accompanied by her son, [name].  HISTORY  01/04/21 Dr. Jannifer Franklin: [name] is a 75 year old right-handed black female with a history of multiple sclerosis with end-stage features.  The patient is on Avonex, she has a significant dementia.  She has a problem with severe trigeminal neuralgia affecting the right face, she has had worsening of her symptoms over the last several weeks.  She has had gamma knife treatments in the past that have not been very effective.  Carbamazepine has been the most effective medication, Lyrica has been added to this but seems to result in a lot of drowsiness but it does help control the pain.  The patient is now eating soft foods because of the pain.  She has not had recent falls but she does not ambulate.  Her son comes in with her today, he is taking care of her on a regular basis.  The patient requires assistance with bathing and dressing.  REVIEW OF SYSTEMS: Out of a complete 14 system review of symptoms, the patient complains only of the following symptoms,  and all other reviewed systems are negative.   See HPI  ALLERGIES: Allergies  Allergen Reactions   Prednisone     Had gotten thrush   Shellfish Allergy     Hives     HOME MEDICATIONS: Outpatient Medications Prior to Visit  Medication Sig Dispense Refill   acetaminophen (TYLENOL) 500 MG tablet Take 500 mg by mouth every 6 (six) hours as needed for mild pain.      amLODipine (NORVASC) 10 MG tablet Take 1 tablet (10 mg total) by mouth daily. 30 tablet 3   APPLE CIDER VINEGAR PO Take 1 capsule by mouth daily. 450mg      atorvastatin (LIPITOR) 40 MG tablet Take 40 mg by mouth daily.     AVONEX PEN 30 MCG/0.5ML AJKT INJECT 30 MCG INTRAMUSCULARLY ONCE WEEKLY 12 each 1   B Complex-C (B-COMPLEX WITH VITAMIN C) tablet Take 1 tablet by mouth daily.     baclofen (LIORESAL) 10 MG tablet TAKE 1 TABLET (10 MG TOTAL) BY MOUTH 4 (FOUR) TIMES DAILY. 360 tablet 1   buPROPion (WELLBUTRIN XL) 300 MG 24 hr tablet Take 300 mg by mouth daily.     carbamazepine (TEGRETOL) 200 MG tablet TAKE 1 TABLET BY MOUTH 3 TIMES A DAY 270 tablet 1   Cholecalciferol (CVS VIT D 5000 HIGH-POTENCY PO) Take 5,000 Int'l Units/L by mouth daily.     CRANBERRY PO Take by mouth.     Diaper Rash  Products (DESITIN CLEAR EX) Apply topically.     docusate sodium (COLACE) 100 MG capsule Take 1 capsule (100 mg total) by mouth 2 (two) times daily. 10 capsule 0   donepezil (ARICEPT) 5 MG tablet Take 1 tablet (5 mg total) by mouth daily. 90 tablet 4   FIBER SELECT GUMMIES PO Take 5 g by mouth 2 (two) times daily.      fish oil-omega-3 fatty acids 1000 MG capsule Take 2 g by mouth 3 (three) times daily.     hydrochlorothiazide (HYDRODIURIL) 25 MG tablet TAKE 1 TABLET BY MOUTH EVERY DAY IN THE MORNING     lamoTRIgine (LAMICTAL) 25 MG tablet One twice a day for 2 weeks, then take 2 tablets twice a day then take 3 tablets twice a day 180 tablet 3   Multiple Vitamins-Minerals (MULTIVITAMINS THER. W/MINERALS) TABS Take 1 tablet by mouth daily.      Potassium Chloride (KLOR-CON PO) Take 20 mEq by mouth 3 (three) times daily.      pregabalin (LYRICA) 100 MG capsule Take 1 capsule (100 mg total) by mouth 2 (two) times daily. 180 capsule 1   Probiotic Product (PROBIOTIC PO) Take by mouth.     sertraline (ZOLOFT) 50 MG tablet Take 75 mg by mouth daily. One and one half tablet once a day in morning.     Skin Protectants, Misc. (WHITE PETROLATUM-ZINC OXIDE) cream Apply topically as needed. (Patient taking differently: Apply 1 application topically daily as needed for dry skin or wound care.) 98 g 0   traMADol (ULTRAM) 50 MG tablet TAKE 1 TABLET (50 MG TOTAL) BY MOUTH EVERY 6 (SIX) HOURS AS NEEDED FOR MODERATE PAIN 30 tablet 0   TURMERIC PO Take 450 mg by mouth.     No facility-administered medications prior to visit.    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 trigeminal neuralgia Right    left leg DVT Left    STRABISMUS SURGERY     Right eye     FAMILY HISTORY: Family History  Problem Relation Age of Onset   Multiple sclerosis Sister    Cervical cancer Mother    Liver disease Father    Hyperlipidemia Brother    Pancreatitis Brother    Hypertension Other    Heart  disease Other    Uterine cancer Other     SOCIAL HISTORY: Social History   Socioeconomic History   Marital status: Divorced    Spouse name: Not on file   Number of children: 2   Years of education: 16   Highest education level: Not on file  Occupational History   Occupation: disabled Education officer, museum  Tobacco Use   Smoking status: Never   Smokeless tobacco: Never  Vaping Use   Vaping Use: Never used  Substance and Sexual Activity   Alcohol use: No   Drug use: No   Sexual activity: Not Currently    Birth control/protection: Post-menopausal  Other Topics Concern   Not on file  Social History Narrative   Divorced, has a son and daughter. [name] is very involved in her care. [phone] and [phone]   Living with her son, [name] as FT caregiver.  02-09-15 ssy      Patient drinks about 2 sodas daily.   Patient is right handed.    Social Determinants of Health   Financial Resource Strain: Not on file  Food Insecurity: Not on file  Transportation Needs: Not on file  Physical Activity: Not on file  Stress: Not on file  Social Connections: Not on file  Intimate Partner Violence: Not on file   PHYSICAL EXAM  Vitals:   04/29/21 1322  BP: 110/67  Pulse: (!) 101   There is no height or weight on file to calculate BMI.  Generalized: Frail, elderly female Neurological examination  Mentation: Alert, follows commands, is drowsy, provides limited verbal response, history provided by her son, speech is slightly slurred Cranial nerve II-XII: Pupils were equal round reactive to light. Extraocular movements were full, visual field were full on confrontational test.  Motor: Significant weakness of right upper extremity, 3/5 with right hand grip, 4/5 left upper extremity and hand grip, limited use of the right upper extremity, prostatic right lower extremity,3/5 strength of left lower extremity Sensory: Sensory testing is intact to soft touch on all 4 extremities. No evidence of extinction is  noted.  Coordination: Cerebellar testing reveals fair finger-nose-finger with the last, cannot perform on the right Gait and station: Wheelchair-bound, is nonambulatory  DIAGNOSTIC DATA (LABS, IMAGING, TESTING) - I reviewed patient records, labs, notes, testing and imaging myself where available.  Lab Results  Component Value Date   WBC 7.2 01/04/2021   HGB 13.2 01/04/2021   HCT 40.3 01/04/2021   MCV 93 01/04/2021   PLT 198 01/04/2021      Component Value Date/Time   NA 146 (H) 01/04/2021 1238   K 4.2 01/04/2021 1238   CL 106 01/04/2021 1238   CO2 25 01/04/2021 1238   GLUCOSE 94 01/04/2021 1238   GLUCOSE 91 10/24/2018 0302   BUN 17 01/04/2021 1238   CREATININE 0.67 01/04/2021 1238   CALCIUM 9.8 01/04/2021 1238   PROT 7.2 01/04/2021 1238   ALBUMIN 4.3 01/04/2021 1238   AST 16 01/04/2021 1238   ALT 13 01/04/2021 1238   ALKPHOS 109 01/04/2021 1238   BILITOT 0.2 01/04/2021 1238   GFRNONAA 90 06/16/2020 1421   GFRAA 103 06/16/2020 1421   No results found for: CHOL, HDL, LDLCALC, LDLDIRECT, TRIG, CHOLHDL No results found for: HGBA1C Lab Results  Component Value Date   VITAMINB12 1,624 (H) 10/12/2018   No results found for: TSH    ASSESSMENT AND PLAN 75 y.o. year old female  has a past medical history of Abnormality of gait, Depression, DVT (deep venous thrombosis) ([facility]), Dyslipidemia, Fibroids, Hypertension, Memory difficulties (12/10/2014), Multiple sclerosis ([facility]), Neurogenic bladder, Organic brain syndrome, Overdose of muscle relaxant, Trigeminal neuralgia, Trigeminal neuralgia, and Wound infection (10/12/2018). here with:  1.  Multiple sclerosis 2.  Right trigeminal neuralgia 3.  Dementia 4.  Gait disorder  -Stop Lamictal, only taking  25 mg daily, when taking 25 mg twice daily, was drooling, very drowsy, her pain is currently well controlled -She does seem quite sleepy, question if she is overmedicated?  We have noticed she has done best when she has been on low-dose  Lyrica 75 mg twice daily, is currently on 100 mg twice daily of Lyrica, we may be able to try to reduce the dose again in the future, has been given Tramadol to take PRN for breakthrough TN pain -Her son shows me a picture of a sacral ulcer, offered home health wound consult, he wants to wait a week or so, has a call into PCP -I will check routine labs, including a carbamazepine level, given that she is more drowsy, ensure no signs of infection -Her other medications will be continued, we may consider a discontinuation of Aricept in the future, unclear how much benefit this is offering -Follow-up in 6 months or sooner if needed  I spent 35 minutes of face-to-face and non-face-to-face time with patient.  This included previsit chart review, lab review, study review, order entry, discussing medications, management, and follow-up.  Butler Denmark, AGNP-C, DNP 04/29/2021, 1:28 PM [facility] [address] [place], [place] [address] [phone]

## 2021-04-29 ENCOUNTER — Other Ambulatory Visit: Payer: Self-pay

## 2021-04-29 ENCOUNTER — Encounter: Payer: Self-pay | Admitting: Neurology

## 2021-04-29 ENCOUNTER — Ambulatory Visit: Payer: Medicare HMO | Admitting: Neurology

## 2021-04-29 VITALS — BP 110/67 | HR 101

## 2021-04-29 DIAGNOSIS — R413 Other amnesia: Secondary | ICD-10-CM | POA: Diagnosis not present

## 2021-04-29 DIAGNOSIS — G35 Multiple sclerosis: Secondary | ICD-10-CM | POA: Diagnosis not present

## 2021-04-29 DIAGNOSIS — G500 Trigeminal neuralgia: Secondary | ICD-10-CM | POA: Diagnosis not present

## 2021-04-29 MED ORDER — AVONEX PEN 30 MCG/0.5ML IM AJKT: AUTO-INJECTOR | INTRAMUSCULAR | 1 refills | Status: DC

## 2021-04-29 MED ORDER — DONEPEZIL HCL 5 MG PO TABS: 5.0000 mg | ORAL_TABLET | Freq: Every day | ORAL | 4 refills | Status: DC

## 2021-04-29 MED ORDER — CARBAMAZEPINE 200 MG PO TABS: ORAL_TABLET | ORAL | 1 refills | Status: DC

## 2021-04-29 MED ORDER — BACLOFEN 10 MG PO TABS: 10.0000 mg | ORAL_TABLET | Freq: Four times a day (QID) | ORAL | 1 refills | Status: DC

## 2021-04-29 NOTE — Progress Notes (Signed)
I have read the note, and I agree with the clinical assessment and plan.  Johnathin Vanderschaaf K Blonnie Maske   

## 2021-04-29 NOTE — Patient Instructions (Signed)
Stop the Lamictal  Will continue current medications, if off the Lamictal for several weeks and pain is well controlled, may consider backing down the Lyrica again to 75 mg twice daily Check labs today  Monitor the pressure ulcer, let me know if you change your mind about wound [facility] service See you back in 6 months

## 2021-04-30 LAB — CBC WITH DIFFERENTIAL/PLATELET
Basophils Absolute: 0 10*3/uL (ref 0.0–0.2)
Basos: 0 %
EOS (ABSOLUTE): 0.1 10*3/uL (ref 0.0–0.4)
Eos: 1 %
Hematocrit: 34.1 % (ref 34.0–46.6)
Hemoglobin: 11 g/dL — ABNORMAL LOW (ref 11.1–15.9)
Immature Grans (Abs): 0 10*3/uL (ref 0.0–0.1)
Immature Granulocytes: 0 %
Lymphocytes Absolute: 1.3 10*3/uL (ref 0.7–3.1)
Lymphs: 29 %
MCH: 30.6 pg (ref 26.6–33.0)
MCHC: 32.3 g/dL (ref 31.5–35.7)
MCV: 95 fL (ref 79–97)
Monocytes Absolute: 0.6 10*3/uL (ref 0.1–0.9)
Monocytes: 12 %
Neutrophils Absolute: 2.6 10*3/uL (ref 1.4–7.0)
Neutrophils: 58 %
Platelets: 185 10*3/uL (ref 150–450)
RBC: 3.6 x10E6/uL — ABNORMAL LOW (ref 3.77–5.28)
RDW: 13.2 % (ref 11.7–15.4)
WBC: 4.5 10*3/uL (ref 3.4–10.8)

## 2021-04-30 LAB — COMPREHENSIVE METABOLIC PANEL
ALT: 11 IU/L (ref 0–32)
AST: 20 IU/L (ref 0–40)
Albumin/Globulin Ratio: 1.5 (ref 1.2–2.2)
Albumin: 4 g/dL (ref 3.7–4.7)
Alkaline Phosphatase: 113 IU/L (ref 44–121)
BUN/Creatinine Ratio: 33 — ABNORMAL HIGH (ref 12–28)
BUN: 17 mg/dL (ref 8–27)
Bilirubin Total: 0.2 mg/dL (ref 0.0–1.2)
CO2: 25 mmol/L (ref 20–29)
Calcium: 9.3 mg/dL (ref 8.7–10.3)
Chloride: 106 mmol/L (ref 96–106)
Creatinine, Ser: 0.52 mg/dL — ABNORMAL LOW (ref 0.57–1.00)
Globulin, Total: 2.7 g/dL (ref 1.5–4.5)
Glucose: 114 mg/dL — ABNORMAL HIGH (ref 65–99)
Potassium: 3.6 mmol/L (ref 3.5–5.2)
Sodium: 146 mmol/L — ABNORMAL HIGH (ref 134–144)
Total Protein: 6.7 g/dL (ref 6.0–8.5)
eGFR: 99 mL/min/{1.73_m2} (ref >59)

## 2021-04-30 LAB — CARBAMAZEPINE LEVEL, TOTAL: Carbamazepine (Tegretol), S: 12.5 ug/mL (ref 4.0–12.0)

## 2021-05-03 ENCOUNTER — Telehealth: Payer: Self-pay | Admitting: Neurology

## 2021-05-03 NOTE — Telephone Encounter (Signed)
I spoke to patient's son who says since stopping lamotrigine, her TN pain has returned.   She had mental status changes when taking '25mg'$  BID. He reduced the dose to just '25mg'$  in the morning. He noticed an improvement in her mental status during the day. However, she still did not return completely back to baseline. When he stopped the lamotrigine completely, she was more clear mentally but her TN pain returned. He had to give her Tramadol today, along w/ lamotrigine '25mg'$  just to control the pain. He would like to restart lamotrigine '25mg'$ , if possible. Per vo by Butler Denmark, NP, restart taking lamotrigine '25mg'$ , 0.5 tablet daily to see if it will help and not cause confusion.   [name] is agreeable to this plan and will try lamotrigine 12.'5mg'$  daily.   He also provided an update on her bedsore. Feels it is looking better. He understands that Judson Roch is willing to place an order for a home health nurse to come assess it further. He declined at this time but will let us know.

## 2021-05-03 NOTE — Telephone Encounter (Signed)
Please call her son [name], labs returned, CBC unremarkable except mildly decreased HGB 11.0, CMP shows sodium 146, could be some dehydration issues going on, carbamazepine level is mildly elevated 12.5, this was same for few months ago, just ensure taking dose 200 mg 3 times daily. We discussed making medication changes due to her being drowsy, we first started with stopping the Lamictal. Please check how her mental status is, I am hopeful she is more alert, if there is no change, we may consider reduction of Lyrica or even carbamazepine. Problem is her TN pain flares easily. [name] knows her well and her medications.

## 2021-05-10 ENCOUNTER — Other Ambulatory Visit: Payer: Medicare HMO | Admitting: Hospice

## 2021-05-10 ENCOUNTER — Other Ambulatory Visit: Payer: Self-pay

## 2021-05-10 DIAGNOSIS — M792 Neuralgia and neuritis, unspecified: Secondary | ICD-10-CM | POA: Diagnosis not present

## 2021-05-10 DIAGNOSIS — R413 Other amnesia: Secondary | ICD-10-CM

## 2021-05-10 DIAGNOSIS — Z515 Encounter for palliative care: Secondary | ICD-10-CM

## 2021-05-10 NOTE — Progress Notes (Signed)
[place] Consult Note Telephone: [phone]  Fax: [phone]  PATIENT NAME: [name] DOB: 1946-07-29 MRN: [id number]  PRIMARY CARE PROVIDER:   Glenis Smoker, MD Glenis Smoker, MD [place],  [place] [address]  REFERRING PROVIDER: Glenis Smoker, MD Glenis Smoker, MD [place],  [place] [address] Contact Information     Name Relation Home Work [place] Son [phone]  [phone]      RESPONSIBLE PARTY:  Majesti Duhn, son [phone]   TELEHEALTH VISIT STATEMENT Due to the COVID-19 crisis, this visit was done via telephone from my office. It was initiated and consented to by this patient and/or family.  Visit is to build trust and highlight Palliative Medicine as specialized medical care for people living with serious illness, aimed at facilitating better quality of life through symptoms relief, assisting with advance care planning and complex medical decision making. This is a follow up visit.  RECOMMENDATIONS/PLAN:   Advance Care Planning/Code Status: Patient remains a full code  Goals of Care: Goals of care include to maximize quality of life and symptom management.  Visit consisted of counseling and education dealing with the complex and emotionally intense issues of symptom management and palliative care in the setting of serious and potentially life-threatening illness. Palliative care team will continue to support patient, patient's family, and medical team.  Symptom management/Plan:  Neuropathic pain: Patient denies neuropathic pain; currently managed with Lyrica, Tegretol, Tramadol.  Depression: Stable.  Continue Zoloft, Bupropion.  Psych consult as needed Memory difficulties: Continue Aricept as ordered.   multiple sclerosis and trigeminal neuralgia: Followed by her neurologist.  Lamictal dose recently reduced with no  decompensation.  Patient continues to be bedbound, requiring extensive assistance in activities of daily living.  Encouraged ongoing supportive care. Follow up: Palliative care will continue to follow for complex medical decision making, advance care planning, and clarification of goals. Return 6 weeks or prn. Encouraged to call provider sooner with any concerns.   CHIEF COMPLAINT: Palliative follow up visit   HISTORY OF PRESENT ILLNESS:  [name] a 75 y.o. female with multiple medical problems including , neuropathic pain, hypertension,  multiple sclerosis and trigeminal neuralgia, hypertension, right BKA.  No report of fall or hospitalization since last visit.  No concerns/complaints from [place].  History obtained from review of EMR, discussion with primary team, family, caregiver  and/or patient. Records reviewed and summarized above. All 10 point systems reviewed and are negative except as documented in history of present illness above  Review and summarization of Epic records shows history from other than patient.   Palliative Care was asked to follow this patient o help address complex decision making in the context of advance care planning and goals of care clarification.   PERTINENT MEDICATIONS:  Outpatient Encounter Medications as of 05/10/2021  Medication Sig   acetaminophen (TYLENOL) 500 MG tablet Take 500 mg by mouth every 6 (six) hours as needed for mild pain.    amLODipine (NORVASC) 10 MG tablet Take 1 tablet (10 mg total) by mouth daily.   APPLE CIDER VINEGAR PO Take 1 capsule by mouth daily. '450mg'$    atorvastatin (LIPITOR) 40 MG tablet Take 40 mg by mouth daily.   B Complex-C (B-COMPLEX WITH VITAMIN C) tablet Take 1 tablet by mouth daily.   baclofen (LIORESAL) 10 MG tablet Take 1 tablet (10 mg total) by mouth 4 (four) times daily.   buPROPion (  WELLBUTRIN XL) 300 MG 24 hr tablet Take 300 mg by mouth daily.   carbamazepine (TEGRETOL) 200 MG tablet TAKE 1 TABLET BY MOUTH 3 TIMES  A DAY   Cholecalciferol ([employer] VIT D 5000 HIGH-POTENCY PO) Take 5,000 Int'l Units/L by mouth daily.   CRANBERRY PO Take by mouth.   Diaper Rash Products (DESITIN CLEAR EX) Apply topically.   docusate sodium (COLACE) 100 MG capsule Take 1 capsule (100 mg total) by mouth 2 (two) times daily.   donepezil (ARICEPT) 5 MG tablet Take 1 tablet (5 mg total) by mouth daily.   FIBER SELECT GUMMIES PO Take 5 g by mouth 2 (two) times daily.    fish oil-omega-3 fatty acids 1000 MG capsule Take 2 g by mouth 3 (three) times daily.   hydrochlorothiazide (HYDRODIURIL) 25 MG tablet TAKE 1 TABLET BY MOUTH EVERY DAY IN THE MORNING   Interferon Beta-1a (AVONEX PEN) 30 MCG/0.5ML AJKT INJECT 30 MCG INTRAMUSCULARLY ONCE WEEKLY   lamoTRIgine (LAMICTAL) 25 MG tablet Take 12.5 mg by mouth daily.   Multiple Vitamins-Minerals (MULTIVITAMINS THER. W/MINERALS) TABS Take 1 tablet by mouth daily.   Potassium Chloride (KLOR-CON PO) Take 20 mEq by mouth 3 (three) times daily.    pregabalin (LYRICA) 100 MG capsule Take 1 capsule (100 mg total) by mouth 2 (two) times daily.   Probiotic Product (PROBIOTIC PO) Take by mouth.   sertraline (ZOLOFT) 50 MG tablet Take 75 mg by mouth daily. One and one half tablet once a day in morning.   Skin Protectants, Misc. (WHITE PETROLATUM-ZINC OXIDE) cream Apply topically as needed. (Patient taking differently: Apply 1 application topically daily as needed for dry skin or wound care.)   traMADol (ULTRAM) 50 MG tablet TAKE 1 TABLET (50 MG TOTAL) BY MOUTH EVERY 6 (SIX) HOURS AS NEEDED FOR MODERATE PAIN   TURMERIC PO Take 450 mg by mouth.   No facility-administered encounter medications on file as of 05/10/2021.    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 More than 50% of the time in this consultation was spent on counseling and coordinating communication   Thank you for the opportunity to participate in the care of [name] Please call our office at [phone] if we can be of additional assistance.  Note: Portions of this note were generated with [employer]. Dictation errors may occur despite best attempts at proofreading.  Teodoro Spray, NP

## 2021-05-17 ENCOUNTER — Ambulatory Visit: Payer: Medicare HMO | Admitting: Neurology

## 2021-05-20 ENCOUNTER — Other Ambulatory Visit: Payer: Self-pay | Admitting: Neurology

## 2021-05-31 DIAGNOSIS — E7800 Pure hypercholesterolemia, unspecified: Secondary | ICD-10-CM | POA: Diagnosis not present

## 2021-05-31 DIAGNOSIS — R1032 Left lower quadrant pain: Secondary | ICD-10-CM | POA: Diagnosis not present

## 2021-05-31 DIAGNOSIS — F325 Major depressive disorder, single episode, in full remission: Secondary | ICD-10-CM | POA: Diagnosis not present

## 2021-05-31 DIAGNOSIS — I10 Essential (primary) hypertension: Secondary | ICD-10-CM | POA: Diagnosis not present

## 2021-05-31 DIAGNOSIS — G35 Multiple sclerosis: Secondary | ICD-10-CM | POA: Diagnosis not present

## 2021-06-25 ENCOUNTER — Emergency Department (HOSPITAL_COMMUNITY): Payer: Medicare HMO

## 2021-06-25 ENCOUNTER — Other Ambulatory Visit: Payer: Self-pay

## 2021-06-25 ENCOUNTER — Inpatient Hospital Stay (HOSPITAL_COMMUNITY)
Admission: EM | Admit: 2021-06-25 | Discharge: 2021-06-29 | DRG: 871 | Disposition: A | Discharge status: Home / Self Care | Payer: Medicare HMO | Attending: Family Medicine | Admitting: Family Medicine

## 2021-06-25 ENCOUNTER — Inpatient Hospital Stay (HOSPITAL_COMMUNITY): Payer: Medicare HMO

## 2021-06-25 ENCOUNTER — Encounter (HOSPITAL_COMMUNITY): Payer: Self-pay

## 2021-06-25 DIAGNOSIS — R4020 Unspecified coma: Secondary | ICD-10-CM | POA: Diagnosis not present

## 2021-06-25 DIAGNOSIS — G9341 Metabolic encephalopathy: Secondary | ICD-10-CM | POA: Diagnosis present

## 2021-06-25 DIAGNOSIS — R9431 Abnormal electrocardiogram [ECG] [EKG]: Secondary | ICD-10-CM | POA: Diagnosis not present

## 2021-06-25 DIAGNOSIS — G500 Trigeminal neuralgia: Secondary | ICD-10-CM | POA: Diagnosis present

## 2021-06-25 DIAGNOSIS — I428 Other cardiomyopathies: Secondary | ICD-10-CM

## 2021-06-25 DIAGNOSIS — I110 Hypertensive heart disease with heart failure: Secondary | ICD-10-CM | POA: Diagnosis present

## 2021-06-25 DIAGNOSIS — G509 Disorder of trigeminal nerve, unspecified: Secondary | ICD-10-CM

## 2021-06-25 DIAGNOSIS — J1282 Pneumonia due to coronavirus disease 2019: Secondary | ICD-10-CM | POA: Diagnosis present

## 2021-06-25 DIAGNOSIS — R109 Unspecified abdominal pain: Secondary | ICD-10-CM | POA: Diagnosis not present

## 2021-06-25 DIAGNOSIS — Z8249 Family history of ischemic heart disease and other diseases of the circulatory system: Secondary | ICD-10-CM

## 2021-06-25 DIAGNOSIS — Z89511 Acquired absence of right leg below knee: Secondary | ICD-10-CM

## 2021-06-25 DIAGNOSIS — R532 Functional quadriplegia: Secondary | ICD-10-CM | POA: Diagnosis present

## 2021-06-25 DIAGNOSIS — R6520 Severe sepsis without septic shock: Secondary | ICD-10-CM | POA: Diagnosis present

## 2021-06-25 DIAGNOSIS — G35 Multiple sclerosis: Secondary | ICD-10-CM | POA: Diagnosis not present

## 2021-06-25 DIAGNOSIS — R0603 Acute respiratory distress: Secondary | ICD-10-CM | POA: Diagnosis not present

## 2021-06-25 DIAGNOSIS — R778 Other specified abnormalities of plasma proteins: Secondary | ICD-10-CM

## 2021-06-25 DIAGNOSIS — E669 Obesity, unspecified: Secondary | ICD-10-CM | POA: Diagnosis present

## 2021-06-25 DIAGNOSIS — Z820 Family history of epilepsy and other diseases of the nervous system: Secondary | ICD-10-CM | POA: Diagnosis not present

## 2021-06-25 DIAGNOSIS — R1110 Vomiting, unspecified: Secondary | ICD-10-CM | POA: Diagnosis not present

## 2021-06-25 DIAGNOSIS — G629 Polyneuropathy, unspecified: Secondary | ICD-10-CM | POA: Diagnosis present

## 2021-06-25 DIAGNOSIS — J690 Pneumonitis due to inhalation of food and vomit: Secondary | ICD-10-CM | POA: Diagnosis not present

## 2021-06-25 DIAGNOSIS — L89152 Pressure ulcer of sacral region, stage 2: Secondary | ICD-10-CM | POA: Diagnosis present

## 2021-06-25 DIAGNOSIS — F0390 Unspecified dementia without behavioral disturbance: Secondary | ICD-10-CM | POA: Diagnosis present

## 2021-06-25 DIAGNOSIS — Z86718 Personal history of other venous thrombosis and embolism: Secondary | ICD-10-CM | POA: Diagnosis not present

## 2021-06-25 DIAGNOSIS — I5020 Unspecified systolic (congestive) heart failure: Secondary | ICD-10-CM | POA: Diagnosis present

## 2021-06-25 DIAGNOSIS — R6510 Systemic inflammatory response syndrome (SIRS) of non-infectious origin without acute organ dysfunction: Secondary | ICD-10-CM | POA: Diagnosis not present

## 2021-06-25 DIAGNOSIS — U071 COVID-19: Secondary | ICD-10-CM | POA: Diagnosis present

## 2021-06-25 DIAGNOSIS — I10 Essential (primary) hypertension: Secondary | ICD-10-CM

## 2021-06-25 DIAGNOSIS — I519 Heart disease, unspecified: Secondary | ICD-10-CM | POA: Diagnosis not present

## 2021-06-25 DIAGNOSIS — A4189 Other specified sepsis: Secondary | ICD-10-CM | POA: Diagnosis not present

## 2021-06-25 DIAGNOSIS — K5900 Constipation, unspecified: Secondary | ICD-10-CM | POA: Diagnosis present

## 2021-06-25 DIAGNOSIS — R000 Tachycardia, unspecified: Secondary | ICD-10-CM | POA: Diagnosis not present

## 2021-06-25 DIAGNOSIS — A419 Sepsis, unspecified organism: Secondary | ICD-10-CM | POA: Diagnosis present

## 2021-06-25 DIAGNOSIS — E785 Hyperlipidemia, unspecified: Secondary | ICD-10-CM | POA: Diagnosis present

## 2021-06-25 DIAGNOSIS — R0602 Shortness of breath: Secondary | ICD-10-CM | POA: Diagnosis not present

## 2021-06-25 DIAGNOSIS — I248 Other forms of acute ischemic heart disease: Secondary | ICD-10-CM | POA: Diagnosis present

## 2021-06-25 DIAGNOSIS — J80 Acute respiratory distress syndrome: Secondary | ICD-10-CM | POA: Diagnosis not present

## 2021-06-25 DIAGNOSIS — J9601 Acute respiratory failure with hypoxia: Secondary | ICD-10-CM | POA: Diagnosis not present

## 2021-06-25 DIAGNOSIS — L8990 Pressure ulcer of unspecified site, unspecified stage: Secondary | ICD-10-CM | POA: Insufficient documentation

## 2021-06-25 DIAGNOSIS — Z6831 Body mass index (BMI) 31.0-31.9, adult: Secondary | ICD-10-CM

## 2021-06-25 DIAGNOSIS — Z7401 Bed confinement status: Secondary | ICD-10-CM

## 2021-06-25 DIAGNOSIS — I517 Cardiomegaly: Secondary | ICD-10-CM | POA: Diagnosis not present

## 2021-06-25 DIAGNOSIS — I700 Atherosclerosis of aorta: Secondary | ICD-10-CM | POA: Diagnosis not present

## 2021-06-25 DIAGNOSIS — R112 Nausea with vomiting, unspecified: Secondary | ICD-10-CM | POA: Diagnosis not present

## 2021-06-25 DIAGNOSIS — Z79899 Other long term (current) drug therapy: Secondary | ICD-10-CM

## 2021-06-25 DIAGNOSIS — R404 Transient alteration of awareness: Secondary | ICD-10-CM | POA: Diagnosis not present

## 2021-06-25 DIAGNOSIS — E876 Hypokalemia: Secondary | ICD-10-CM | POA: Diagnosis present

## 2021-06-25 DIAGNOSIS — I5181 Takotsubo syndrome: Secondary | ICD-10-CM | POA: Diagnosis present

## 2021-06-25 DIAGNOSIS — J189 Pneumonia, unspecified organism: Secondary | ICD-10-CM

## 2021-06-25 DIAGNOSIS — R911 Solitary pulmonary nodule: Secondary | ICD-10-CM | POA: Diagnosis not present

## 2021-06-25 DIAGNOSIS — N319 Neuromuscular dysfunction of bladder, unspecified: Secondary | ICD-10-CM | POA: Diagnosis present

## 2021-06-25 DIAGNOSIS — R0689 Other abnormalities of breathing: Secondary | ICD-10-CM | POA: Diagnosis not present

## 2021-06-25 DIAGNOSIS — G928 Other toxic encephalopathy: Secondary | ICD-10-CM | POA: Diagnosis not present

## 2021-06-25 DIAGNOSIS — I2510 Atherosclerotic heart disease of native coronary artery without angina pectoris: Secondary | ICD-10-CM | POA: Diagnosis present

## 2021-06-25 DIAGNOSIS — R918 Other nonspecific abnormal finding of lung field: Secondary | ICD-10-CM | POA: Diagnosis not present

## 2021-06-25 DIAGNOSIS — Z8049 Family history of malignant neoplasm of other genital organs: Secondary | ICD-10-CM

## 2021-06-25 HISTORY — DX: Unspecified dementia, unspecified severity, without behavioral disturbance, psychotic disturbance, mood disturbance, and anxiety: F03.90

## 2021-06-25 LAB — I-STAT VENOUS BLOOD GAS, ED
Acid-Base Excess: 2 mmol/L (ref 0.0–2.0)
Bicarbonate: 26.1 mmol/L (ref 20.0–28.0)
Calcium, Ion: 1.06 mmol/L — ABNORMAL LOW (ref 1.15–1.40)
HCT: 37 % (ref 36.0–46.0)
Hemoglobin: 12.6 g/dL (ref 12.0–15.0)
O2 Saturation: 99 %
Potassium: 3.4 mmol/L — ABNORMAL LOW (ref 3.5–5.1)
Sodium: 137 mmol/L (ref 135–145)
TCO2: 27 mmol/L (ref 22–32)
pCO2, Ven: 38 mmHg — ABNORMAL LOW (ref 44.0–60.0)
pH, Ven: 7.445 — ABNORMAL HIGH (ref 7.250–7.430)
pO2, Ven: 153 mmHg — ABNORMAL HIGH (ref 32.0–45.0)

## 2021-06-25 LAB — BASIC METABOLIC PANEL
Anion gap: 13 (ref 5–15)
BUN: 8 mg/dL (ref 8–23)
CO2: 23 mmol/L (ref 22–32)
Calcium: 9.3 mg/dL (ref 8.9–10.3)
Chloride: 100 mmol/L (ref 98–111)
Creatinine, Ser: 0.54 mg/dL (ref 0.44–1.00)
GFR, Estimated: >60 mL/min (ref >60)
Glucose, Bld: 149 mg/dL — ABNORMAL HIGH (ref 70–99)
Potassium: 3.6 mmol/L (ref 3.5–5.1)
Sodium: 136 mmol/L (ref 135–145)

## 2021-06-25 LAB — HEPATIC FUNCTION PANEL
ALT: 18 U/L (ref 0–44)
AST: 31 U/L (ref 15–41)
Albumin: 3 g/dL — ABNORMAL LOW (ref 3.5–5.0)
Alkaline Phosphatase: 89 U/L (ref 38–126)
Bilirubin, Direct: 0.3 mg/dL — ABNORMAL HIGH (ref 0.0–0.2)
Indirect Bilirubin: 0.5 mg/dL (ref 0.3–0.9)
Total Bilirubin: 0.8 mg/dL (ref 0.3–1.2)
Total Protein: 6.2 g/dL — ABNORMAL LOW (ref 6.5–8.1)

## 2021-06-25 LAB — ECHOCARDIOGRAM COMPLETE
AR max vel: 2.58 cm2
AV Area VTI: 3.17 cm2
AV Area mean vel: 2.8 cm2
AV Mean grad: 2 mmHg
AV Peak grad: 4.6 mmHg
Ao pk vel: 1.07 m/s
Height: 65 in
P 1/2 time: 445 msec
S' Lateral: 2.7 cm
Weight: 3040.58 oz

## 2021-06-25 LAB — CBC WITH DIFFERENTIAL/PLATELET
Abs Immature Granulocytes: 0.02 10*3/uL (ref 0.00–0.07)
Basophils Absolute: 0.1 10*3/uL (ref 0.0–0.1)
Basophils Relative: 1 %
Eosinophils Absolute: 0.1 10*3/uL (ref 0.0–0.5)
Eosinophils Relative: 1 %
HCT: 38.6 % (ref 36.0–46.0)
Hemoglobin: 12.8 g/dL (ref 12.0–15.0)
Immature Granulocytes: 0 %
Lymphocytes Relative: 8 %
Lymphs Abs: 0.9 10*3/uL (ref 0.7–4.0)
MCH: 31.4 pg (ref 26.0–34.0)
MCHC: 33.2 g/dL (ref 30.0–36.0)
MCV: 94.8 fL (ref 80.0–100.0)
Monocytes Absolute: 1 10*3/uL (ref 0.1–1.0)
Monocytes Relative: 9 %
Neutro Abs: 9 10*3/uL — ABNORMAL HIGH (ref 1.7–7.7)
Neutrophils Relative %: 81 %
Platelets: 170 10*3/uL (ref 150–400)
RBC: 4.07 MIL/uL (ref 3.87–5.11)
RDW: 13.6 % (ref 11.5–15.5)
WBC: 11 10*3/uL — ABNORMAL HIGH (ref 4.0–10.5)
nRBC: 0 % (ref 0.0–0.2)

## 2021-06-25 LAB — BRAIN NATRIURETIC PEPTIDE: B Natriuretic Peptide: 34.2 pg/mL (ref 0.0–100.0)

## 2021-06-25 LAB — APTT
aPTT: >200 seconds (ref 24–36)
aPTT: 182 seconds (ref 24–36)

## 2021-06-25 LAB — C-REACTIVE PROTEIN: CRP: 9.8 mg/dL — ABNORMAL HIGH (ref <1.0)

## 2021-06-25 LAB — PROTIME-INR
INR: 1.3 — ABNORMAL HIGH (ref 0.8–1.2)
INR: 1.2 (ref 0.8–1.2)
Prothrombin Time: 16.3 seconds — ABNORMAL HIGH (ref 11.4–15.2)
Prothrombin Time: 14.7 seconds (ref 11.4–15.2)

## 2021-06-25 LAB — RESP PANEL BY RT-PCR (FLU A&B, COVID) ARPGX2
Influenza A by PCR: NEGATIVE
Influenza B by PCR: NEGATIVE
SARS Coronavirus 2 by RT PCR: POSITIVE — AB

## 2021-06-25 LAB — LACTIC ACID, PLASMA
Lactic Acid, Venous: 1.9 mmol/L (ref 0.5–1.9)
Lactic Acid, Venous: 1.9 mmol/L (ref 0.5–1.9)

## 2021-06-25 LAB — PROCALCITONIN: Procalcitonin: 1.95 ng/mL

## 2021-06-25 LAB — LACTATE DEHYDROGENASE: LDH: 221 U/L — ABNORMAL HIGH (ref 98–192)

## 2021-06-25 LAB — D-DIMER, QUANTITATIVE: D-Dimer, Quant: 1.15 ug/mL-FEU — ABNORMAL HIGH (ref 0.00–0.50)

## 2021-06-25 LAB — FERRITIN: Ferritin: 114 ng/mL (ref 11–307)

## 2021-06-25 LAB — TSH: TSH: 1.178 u[IU]/mL (ref 0.350–4.500)

## 2021-06-25 LAB — HEPARIN LEVEL (UNFRACTIONATED): Heparin Unfractionated: 0.53 IU/mL (ref 0.30–0.70)

## 2021-06-25 LAB — TROPONIN I (HIGH SENSITIVITY)
Troponin I (High Sensitivity): 398 ng/L (ref <18)
Troponin I (High Sensitivity): 1150 ng/L (ref <18)
Troponin I (High Sensitivity): 2170 ng/L (ref <18)

## 2021-06-25 LAB — FIBRINOGEN: Fibrinogen: 492 mg/dL — ABNORMAL HIGH (ref 210–475)

## 2021-06-25 MED ORDER — FAMOTIDINE 20 MG PO TABS: 20.0000 mg | ORAL_TABLET | Freq: Two times a day (BID) | ORAL | Status: DC

## 2021-06-25 MED ORDER — BACLOFEN 10 MG PO TABS
10.0000 mg | ORAL_TABLET | Freq: Four times a day (QID) | ORAL | Status: DC
  Administered 2021-06-26 – 2021-06-29 (×13): 10 mg via ORAL
  Filled 2021-06-25 (×14): qty 1

## 2021-06-25 MED ORDER — SODIUM CHLORIDE 0.9 % IV SOLN
100.0000 mg | Freq: Two times a day (BID) | INTRAVENOUS | Status: DC
  Administered 2021-06-25 – 2021-06-29 (×8): 100 mg via INTRAVENOUS
  Filled 2021-06-25 (×10): qty 100

## 2021-06-25 MED ORDER — ALBUTEROL SULFATE HFA 108 (90 BASE) MCG/ACT IN AERS
2.0000 | INHALATION_SPRAY | Freq: Four times a day (QID) | RESPIRATORY_TRACT | Status: DC
  Administered 2021-06-25 – 2021-06-29 (×13): 2 via RESPIRATORY_TRACT
  Filled 2021-06-25: qty 6.7

## 2021-06-25 MED ORDER — HYDROCOD POLST-CPM POLST ER 10-8 MG/5ML PO SUER: 5.0000 mL | Freq: Two times a day (BID) | ORAL | Status: DC | PRN

## 2021-06-25 MED ORDER — HEPARIN BOLUS VIA INFUSION
4000.0000 [IU] | Freq: Once | INTRAVENOUS | Status: AC
  Administered 2021-06-25: 4000 [IU] via INTRAVENOUS
  Filled 2021-06-25: qty 4000

## 2021-06-25 MED ORDER — VITAMINS A & D EX OINT
1.0000 "application " | TOPICAL_OINTMENT | Freq: Every day | CUTANEOUS | Status: DC | PRN
  Administered 2021-06-26 – 2021-06-27 (×2): 1 via TOPICAL
  Filled 2021-06-25: qty 56.7

## 2021-06-25 MED ORDER — SERTRALINE HCL 50 MG PO TABS
75.0000 mg | ORAL_TABLET | Freq: Every day | ORAL | Status: DC
  Administered 2021-06-26 – 2021-06-29 (×4): 75 mg via ORAL
  Filled 2021-06-25 (×4): qty 2

## 2021-06-25 MED ORDER — ASPIRIN 81 MG PO CHEW
81.0000 mg | CHEWABLE_TABLET | Freq: Once | ORAL | Status: DC
Start: 2021-06-25 — End: 2021-06-28

## 2021-06-25 MED ORDER — CARBAMAZEPINE 200 MG PO TABS
200.0000 mg | ORAL_TABLET | Freq: Three times a day (TID) | ORAL | Status: DC
  Administered 2021-06-26 – 2021-06-29 (×11): 200 mg via ORAL
  Filled 2021-06-25 (×13): qty 1

## 2021-06-25 MED ORDER — ASCORBIC ACID 500 MG PO TABS
500.0000 mg | ORAL_TABLET | Freq: Every day | ORAL | Status: DC
  Administered 2021-06-26 – 2021-06-29 (×4): 500 mg via ORAL
  Filled 2021-06-25 (×4): qty 1

## 2021-06-25 MED ORDER — SODIUM CHLORIDE 0.9 % IV SOLN
2.0000 g | INTRAVENOUS | Status: DC
  Administered 2021-06-25 – 2021-06-28 (×4): 2 g via INTRAVENOUS
  Filled 2021-06-25 (×5): qty 20

## 2021-06-25 MED ORDER — ZINC SULFATE 220 (50 ZN) MG PO CAPS
220.0000 mg | ORAL_CAPSULE | Freq: Every day | ORAL | Status: DC
  Administered 2021-06-26 – 2021-06-29 (×4): 220 mg via ORAL
  Filled 2021-06-25 (×4): qty 1

## 2021-06-25 MED ORDER — PREGABALIN 100 MG PO CAPS
100.0000 mg | ORAL_CAPSULE | Freq: Two times a day (BID) | ORAL | Status: DC
  Administered 2021-06-26 – 2021-06-29 (×7): 100 mg via ORAL
  Filled 2021-06-25 (×7): qty 1

## 2021-06-25 MED ORDER — ONDANSETRON HCL 4 MG PO TABS: 4.0000 mg | ORAL_TABLET | Freq: Four times a day (QID) | ORAL | Status: DC | PRN

## 2021-06-25 MED ORDER — CARVEDILOL 3.125 MG PO TABS
3.1250 mg | ORAL_TABLET | Freq: Two times a day (BID) | ORAL | Status: DC
  Administered 2021-06-26 – 2021-06-29 (×7): 3.125 mg via ORAL
  Filled 2021-06-25 (×7): qty 1

## 2021-06-25 MED ORDER — SODIUM CHLORIDE 0.9% FLUSH
3.0000 mL | Freq: Two times a day (BID) | INTRAVENOUS | Status: DC
  Administered 2021-06-25 – 2021-06-29 (×8): 3 mL via INTRAVENOUS

## 2021-06-25 MED ORDER — HEPARIN (PORCINE) 25000 UT/250ML-% IV SOLN
750.0000 [IU]/h | INTRAVENOUS | Status: DC
  Administered 2021-06-25 – 2021-06-26 (×2): 900 [IU]/h via INTRAVENOUS
  Filled 2021-06-25 (×2): qty 250

## 2021-06-25 MED ORDER — SODIUM CHLORIDE 0.9 % IV SOLN: Freq: Once | INTRAVENOUS | Status: AC

## 2021-06-25 MED ORDER — ACETAMINOPHEN 325 MG PO TABS
650.0000 mg | ORAL_TABLET | Freq: Four times a day (QID) | ORAL | Status: DC | PRN
Start: 2021-06-25 — End: 2021-06-29
  Administered 2021-06-27 – 2021-06-28 (×2): 650 mg via ORAL
  Filled 2021-06-25 (×3): qty 2

## 2021-06-25 MED ORDER — DERMAGRAN BC EX CREA: 1.0000 "application " | TOPICAL_CREAM | Freq: Every day | CUTANEOUS | Status: DC | PRN

## 2021-06-25 MED ORDER — WHITE PETROLATUM EX OINT
1.0000 "application " | TOPICAL_OINTMENT | Freq: Every day | CUTANEOUS | Status: DC | PRN
  Filled 2021-06-25: qty 5

## 2021-06-25 MED ORDER — ENOXAPARIN SODIUM 40 MG/0.4ML IJ SOSY: 40.0000 mg | PREFILLED_SYRINGE | INTRAMUSCULAR | Status: DC

## 2021-06-25 MED ORDER — ONDANSETRON HCL 4 MG/2ML IJ SOLN: 4.0000 mg | Freq: Four times a day (QID) | INTRAMUSCULAR | Status: DC | PRN

## 2021-06-25 MED ORDER — ATORVASTATIN CALCIUM 40 MG PO TABS
40.0000 mg | ORAL_TABLET | Freq: Every day | ORAL | Status: DC
  Administered 2021-06-26 – 2021-06-28 (×3): 40 mg via ORAL
  Filled 2021-06-25 (×3): qty 1

## 2021-06-25 MED ORDER — FAMOTIDINE IN NACL 20-0.9 MG/50ML-% IV SOLN
20.0000 mg | Freq: Two times a day (BID) | INTRAVENOUS | Status: DC
  Administered 2021-06-25 – 2021-06-26 (×2): 20 mg via INTRAVENOUS
  Filled 2021-06-25 (×5): qty 50

## 2021-06-25 MED ORDER — POTASSIUM CHLORIDE 10 MEQ/100ML IV SOLN
10.0000 meq | Freq: Once | INTRAVENOUS | Status: AC
  Administered 2021-06-25: 10 meq via INTRAVENOUS
  Filled 2021-06-25: qty 100

## 2021-06-25 MED ORDER — SODIUM CHLORIDE 0.9 % IV SOLN
200.0000 mg | Freq: Once | INTRAVENOUS | Status: AC
  Administered 2021-06-25: 200 mg via INTRAVENOUS
  Filled 2021-06-25: qty 40

## 2021-06-25 MED ORDER — IPRATROPIUM-ALBUTEROL 0.5-2.5 (3) MG/3ML IN SOLN: 3.0000 mL | Freq: Once | RESPIRATORY_TRACT | Status: DC

## 2021-06-25 MED ORDER — BUPROPION HCL ER (XL) 150 MG PO TB24
300.0000 mg | ORAL_TABLET | Freq: Every day | ORAL | Status: DC
  Administered 2021-06-26 – 2021-06-29 (×4): 300 mg via ORAL
  Filled 2021-06-25 (×3): qty 2
  Filled 2021-06-25: qty 1
  Filled 2021-06-25: qty 2

## 2021-06-25 MED ORDER — SODIUM CHLORIDE 0.9 % IV SOLN
100.0000 mg | Freq: Every day | INTRAVENOUS | Status: AC
  Administered 2021-06-26 – 2021-06-29 (×4): 100 mg via INTRAVENOUS
  Filled 2021-06-25 (×4): qty 20

## 2021-06-25 MED ORDER — SORBITOL 70 % SOLN
400.0000 mL | TOPICAL_OIL | Freq: Once | ORAL | Status: AC
  Administered 2021-06-25: 400 mL via RECTAL
  Filled 2021-06-25: qty 120

## 2021-06-25 MED ORDER — LAMOTRIGINE 25 MG PO TABS
25.0000 mg | ORAL_TABLET | Freq: Every day | ORAL | Status: DC
  Administered 2021-06-26 – 2021-06-28 (×3): 25 mg via ORAL
  Filled 2021-06-25 (×5): qty 1

## 2021-06-25 MED ORDER — GUAIFENESIN-DM 100-10 MG/5ML PO SYRP: 10.0000 mL | ORAL_SOLUTION | ORAL | Status: DC | PRN

## 2021-06-25 MED ORDER — IOHEXOL 350 MG/ML SOLN
100.0000 mL | Freq: Once | INTRAVENOUS | Status: AC | PRN
  Administered 2021-06-25: 100 mL via INTRAVENOUS

## 2021-06-25 NOTE — Progress Notes (Signed)
  Echocardiogram 2D Echocardiogram has been performed.  [name] 06/25/2021, 1:32 PM

## 2021-06-25 NOTE — Progress Notes (Signed)
ANTICOAGULATION CONSULT NOTE - Initial Consult  Pharmacy Consult for heparin Indication: chest pain/ACS  Allergies  Allergen Reactions   Prednisone Other (See Comments)    Caused thrush- will need something to reverse this   Shellfish Allergy Hives        Wound Dressing Adhesive     Splits the skin    Patient Measurements: Height: '5\' 5"'$  (165.1 cm) Weight: 86.2 kg (190 lb 0.6 oz) IBW/kg (Calculated) : 57 Heparin Dosing Weight: 75.7 kg  Vital Signs: Temp: 98.1 F (36.7 C) (09/16 0818) Temp Source: Oral (09/16 0818) BP: 114/73 (09/16 1115) Pulse Rate: 133 (09/16 1115)  Labs: Recent Labs    06/25/21 0821 06/25/21 0914 06/25/21 1034  HGB 12.8 12.6  --   HCT 38.6 37.0  --   PLT 170  --   --   CREATININE 0.54  --   --   TROPONINIHS 398*  --  1,150*    Estimated Creatinine Clearance: 68.9 mL/min (by C-G formula based on SCr of 0.54 mg/dL).   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 Found to have troponin elevation. Heparin consult for ACS.  No AC PTA. CBC wnl.   Goal of Therapy Heparin level 0.3-0.7 units/ml Monitor platelets by anticoagulation protocol: Yes   Plan:  Give 4000 units bolus x 1 Start heparin infusion at 900 units/hr Check anti-Xa level in 8 hours and daily while on  heparin Continue to monitor H&H and platelets  Joetta Manners, PharmD, [facility] Clinical Pharmacist ED RPh Phone: [place]: [phone]

## 2021-06-25 NOTE — ED Notes (Signed)
IV team at bedside 

## 2021-06-25 NOTE — ED Notes (Signed)
Phlebotomist is coming to draw 2nd blood culture set and remaining labs ordered.

## 2021-06-25 NOTE — Progress Notes (Signed)
ANTICOAGULATION CONSULT NOTE - Initial Consult  Pharmacy Consult for heparin Indication: chest pain/ACS  Allergies  Allergen Reactions   Prednisone Other (See Comments)    Caused thrush- will need something to reverse this   Shellfish Allergy Hives        Wound Dressing Adhesive     Splits the skin    Patient Measurements: Height: '5\' 4"'$  (162.6 cm) Weight: 69.4 kg (153 lb) IBW/kg (Calculated) : 54.7 Heparin Dosing Weight: 75.7 kg  Vital Signs: Temp: 99.4 F (37.4 C) (09/16 2005) Temp Source: Oral (09/16 2005) BP: 116/69 (09/16 2200) Pulse Rate: 113 (09/16 2200)  Labs: Recent Labs    06/25/21 0821 06/25/21 0914 06/25/21 1034 06/25/21 1153 06/25/21 1618 06/25/21 1621 06/25/21 2100  HGB 12.8 12.6  --   --   --   --   --   HCT 38.6 37.0  --   --   --   --   --   PLT 170  --   --   --   --   --   --   APTT  --   --   --  >200*  --  182*  --   LABPROT  --   --   --  16.3*  --  14.7  --   INR  --   --   --  1.3*  --  1.2  --   HEPARINUNFRC  --   --   --   --   --   --  0.53  CREATININE 0.54  --   --   --   --   --   --   TROPONINIHS 398*  --  1,150*  --  2,170*  --   --      Estimated Creatinine Clearance: 60.8 mL/min (by C-G formula based on SCr of 0.54 mg/dL).   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 Found to have troponin elevation. Heparin consult for ACS.  No AC PTA. CBC wnl.   9/16 '@2139'$  Heparin level 0.53 (on 900units/hr)  Goal of Therapy Heparin level 0.3-0.7 units/ml Monitor platelets by anticoagulation protocol: Yes   Plan:  Continue heparin drip at 900 units/hr Obtain confirmatory heparin level with am labs Daily heparin level and CBC ordered   Donnald Garre, PharmD Clinical Pharmacist  Please check AMION for all [place] numbers After 10:00 PM, call [place] [phone]

## 2021-06-25 NOTE — ED Notes (Signed)
Attempted to do swallow screen with pt. Pt unable to swallow well. Started coughing with 2 sips of water. Unable to tolerate PO meds at this time. Dr.Smith aware.

## 2021-06-25 NOTE — ED Triage Notes (Signed)
Pt lives with son. Pt has late stage MS and is bed bound. Pt was found this AM with increased work of breathing per son and lethargic. Fire department came to pt home with O2 at 60%. Not normally on O2 at home. Blood sugar 174. Non rebreather placed by EMS with O2 at 99%.

## 2021-06-25 NOTE — H&P (Addendum)
History and Physical    JANLYN PROROK [id number] DOB: 02/16/1946 DOA: 06/25/2021  Referring MD/NP/PA: Campbell Stall, DO PCP: Glenis Smoker, MD  Patient coming from: Home(lives with son who is her caregiver) via EMS  Chief Complaint: Altered mental status  I have personally briefly reviewed patient's old medical records in [place]   HPI: DASHAI BIESE is a 75 y.o. female with medical history significant of MS who is bedbound at baseline, hypertension, dyslipidemia, neurogenic bladder, trigeminal neuralgia, and DVT in 2013 no longer on anticoagulation who presents after being noted to be acutely altered this morning.  History is obtained from her son who is present at bedside.  Patient was noted by her son to be more lethargic this morning with increased work of breathing and foaming at the mouth.  The patient was not responding to him like normal concerned and called EMS.  Normally the patient is alert, but orientation to time and place sometimes waxes and wanes.  Usually she can at least talk in complete phrases/sentences.  The patient's son had recently come down with a productive cough earlier in the week and was productive.  His mother had feeling under the weather and not eating or drinking much for the last 3 days. She currently complains of abdominal pain, but is not able to say much more than.  The patient has been vaccinated for COVID and boosted x2 due to her history of MS. Son notes that she is no longer on anticoagulation, but is not totally clear on why. Upon EMS arrival patient was noted to be hypoxic into the 60s for which she was placed on nonrebreather with improvement in O2 saturations to 99%.  ED Course: Upon admission to the emergency department patient was seen to be afebrile, pulse 125-150, respirations 19-32, blood pressures maintained, and O2 saturations noted as low as 89% initially placed on nonrebreather with improvement in O2 saturations.  After  suctioning patient able to be weaned down to 3 L of nasal cannula oxygen.  Labs significant for WBC 11, BNP 34.2, troponin 398, and lactic acid 1.9.  Chest x-ray noted enlarged cardio pericardial silhouette with pulmonary vascular congestion and possible interstitial edema.  COVID-19 screening was positive.  Case had been discussed with cardiology due to the elevated troponin and it was thought symptoms could likely be secondary to demand and recommended hospitalist admission.  Patient had been given 81 mg of aspirin p.o., 10 mEq of potassium chloride IV, and started on normal saline at 100 mL/h for 1 L.  Review of Systems  Unable to perform ROS: Mental status change  Respiratory:  Positive for cough, sputum production and shortness of breath.   Gastrointestinal:  Positive for abdominal pain.   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 trigeminal neuralgia Right    left leg DVT Left    STRABISMUS SURGERY     Right eye      reports that she has never smoked. She has never used smokeless tobacco. She reports that she does not drink alcohol and does not use drugs.  Allergies  Allergen Reactions   Prednisone     Had gotten thrush   Shellfish Allergy     Hives     Family  History  Problem Relation Age of Onset   Multiple sclerosis Sister    Cervical cancer Mother    Liver disease Father    Hyperlipidemia Brother    Pancreatitis Brother    Hypertension Other    Heart disease Other    Uterine cancer Other     Prior to Admission medications   Medication Sig Start Date End Date Taking? Authorizing Provider  traMADol (ULTRAM) 50 MG tablet TAKE 1 TABLET (50 MG TOTAL) BY MOUTH EVERY 6 (SIX) HOURS AS NEEDED FOR MODERATE PAIN 05/20/21   Kathrynn Ducking, MD  acetaminophen (TYLENOL) 500 MG tablet Take 500 mg by mouth every 6 (six) hours as needed for mild pain.     [provider]  amLODipine (NORVASC) 10 MG tablet Take 1 tablet (10 mg total) by mouth daily. 11/03/11   Deneise Lever, MD  APPLE CIDER VINEGAR PO Take 1 capsule by mouth daily. '450mg'$     [provider]  atorvastatin (LIPITOR) 40 MG tablet Take 40 mg by mouth daily.    [provider]  B Complex-C (B-COMPLEX WITH VITAMIN C) tablet Take 1 tablet by mouth daily.    [provider]  baclofen (LIORESAL) 10 MG tablet Take 1 tablet (10 mg total) by mouth 4 (four) times daily. 04/29/21   Suzzanne Cloud, NP  buPROPion (WELLBUTRIN XL) 300 MG 24 hr tablet Take 300 mg by mouth daily.    Deneise Lever, MD  carbamazepine (TEGRETOL) 200 MG tablet TAKE 1 TABLET BY MOUTH 3 TIMES A DAY 04/29/21   Suzzanne Cloud, NP  Cholecalciferol ([employer] VIT D 5000 HIGH-POTENCY PO) Take 5,000 Int'l Units/L by mouth daily.    [provider]  CRANBERRY PO Take by mouth.    [provider]  Diaper Rash Products (DESITIN CLEAR EX) Apply topically.    [provider]  docusate sodium (COLACE) 100 MG capsule Take 1 capsule (100 mg total) by mouth 2 (two) times daily. 10/24/18   Mikhail, Velta Addison, DO  donepezil (ARICEPT) 5 MG tablet Take 1 tablet (5 mg total) by mouth daily. 04/29/21   Suzzanne Cloud, NP  FIBER SELECT GUMMIES PO Take 5 g by mouth 2 (two) times daily.     [provider]  fish oil-omega-3 fatty acids 1000 MG capsule Take 2 g by mouth 3 (three) times daily.    Deneise Lever, MD  hydrochlorothiazide (HYDRODIURIL) 25 MG tablet TAKE 1 TABLET BY MOUTH EVERY DAY IN THE MORNING 12/29/18   [provider]  Interferon Beta-1a (AVONEX PEN) 30 MCG/0.5ML AJKT INJECT 30 MCG INTRAMUSCULARLY ONCE WEEKLY 04/29/21   Suzzanne Cloud, NP  lamoTRIgine (LAMICTAL) 25 MG tablet Take 12.5 mg by mouth daily.    [provider]  Multiple Vitamins-Minerals (MULTIVITAMINS THER. W/MINERALS) TABS Take 1 tablet by mouth daily.  [provider]  Potassium Chloride (KLOR-CON PO) Take 20 mEq by mouth 3 (three) times daily.     [provider]  pregabalin (LYRICA) 100 MG capsule Take 1 capsule (100 mg total) by mouth 2 (two) times daily. 01/07/21   Kathrynn Ducking, MD  Probiotic Product (PROBIOTIC PO) Take by mouth.    [provider]  sertraline (ZOLOFT) 50 MG tablet Take 75 mg by mouth daily. One and one half tablet once a day in morning.    [provider]  Skin Protectants, Misc. (WHITE PETROLATUM-ZINC OXIDE) cream Apply topically as needed. Patient taking differently: Apply 1 application topically daily as needed for dry skin or wound care. 04/06/17   Lysbeth Penner, FNP  TURMERIC PO Take 450 mg by mouth.    [provider]    Physical Exam:  Constitutional: Elderly female who appears acutely ill Vitals:   06/25/21 1030 06/25/21 1045 06/25/21 1100 06/25/21 1115  BP: (!) 116/56 112/68 114/73 114/73  Pulse: (!) 135 (!) 125 (!) 128 (!) 133  Resp: (!) '29 19 20 '$ (!) 25  Temp:      TempSrc:      SpO2: 98% 100% 100% 98%  Weight:      Height:       Eyes: PERRL, lids and conjunctivae normal ENMT: Mucous membranes are moist. Posterior pharynx clear of any exudate or lesions.  Neck: normal, supple, no masses, no thyromegaly Respiratory: Tachypneic with rhonchi appreciated in bilateral lung fields.  Patient  currently on 3 L nasal cannula oxygen with O2 saturations maintained. Cardiovascular: Tachycardic. No extremity edema. 2+ pedal pulses. No carotid bruits.  Abdomen: Generalized tenderness palpation appreciated on physical exam.  Bowel sounds present in all 4 quadrants. Musculoskeletal: no clubbing / cyanosis.  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 Skin: Poor skin turgor.  Neurologic: MS with weakness right upper extremity 3/5, left upper extremity 4/5, and left lower extremity 3/5. Psychiatric: Lethargic, but oriented x self.     Labs on Admission: I have personally reviewed following labs and imaging studies  CBC: Recent Labs  Lab 06/25/21 0821 06/25/21 0914  WBC 11.0*  --   NEUTROABS 9.0*  --   HGB 12.8 12.6  HCT 38.6 37.0  MCV 94.8  --   PLT 170  --    Basic Metabolic Panel: Recent Labs  Lab 06/25/21 0821 06/25/21 0914  NA 136 137  K 3.6 3.4*  CL 100  --   CO2 23  --   GLUCOSE 149*  --   BUN 8  --   CREATININE 0.54  --   CALCIUM 9.3  --    GFR: Estimated Creatinine Clearance: 68.9 mL/min (by C-G formula based on SCr of 0.54 mg/dL). Liver Function Tests: No results for input(s): AST, ALT, ALKPHOS, BILITOT, PROT, ALBUMIN in the last 168 hours. No results for input(s): LIPASE, AMYLASE in the last 168 hours. No results for input(s): AMMONIA in the last 168 hours. Coagulation Profile: No results for input(s): INR, PROTIME in the last 168 hours. Cardiac Enzymes: No results for input(s): CKTOTAL, CKMB, CKMBINDEX, TROPONINI in the last 168 hours. BNP (last 3 results) No results for input(s): PROBNP in the last 8760 hours. HbA1C: No results for input(s): HGBA1C in the last 72 hours. CBG: No results for input(s): GLUCAP in the last 168 hours. Lipid Profile: No results for input(s): CHOL, HDL, LDLCALC, TRIG, CHOLHDL, LDLDIRECT in the last 72 hours. Thyroid Function Tests: No results  for input(s): TSH, T4TOTAL, FREET4, T3FREE, THYROIDAB in the last 72  hours. Anemia Panel: No results for input(s): VITAMINB12, FOLATE, FERRITIN, TIBC, IRON, RETICCTPCT in the last 72 hours. Urine analysis:    Component Value Date/Time   COLORURINE ORANGE (A) 09/28/2011 1846   APPEARANCEUR Cloudy (A) 02/04/2014 1313   LABSPEC 1.036 (H) 09/28/2011 1846   PHURINE 6.5 09/28/2011 1846   GLUCOSEU Negative 02/04/2014 1313   HGBUR MODERATE (A) 09/28/2011 1846   BILIRUBINUR Negative 02/04/2014 1313   KETONESUR 15 (A) 09/28/2011 1846   PROTEINUR Negative 02/04/2014 1313   PROTEINUR 100 (A) 09/28/2011 1846   UROBILINOGEN 2.0 (H) 09/28/2011 1846   NITRITE Negative 02/04/2014 1313   NITRITE POSITIVE (A) 09/28/2011 1846   LEUKOCYTESUR Negative 02/04/2014 1313   Sepsis Labs: Recent Results (from the past 240 hour(s))  Resp Panel by RT-PCR (Flu A&B, Covid) Nasopharyngeal Swab     Status: Abnormal   Collection Time: 06/25/21  8:20 AM   Specimen: Nasopharyngeal Swab; Nasopharyngeal(NP) swabs in vial transport medium  Result Value Ref Range Status   SARS Coronavirus 2 by RT PCR POSITIVE (A) NEGATIVE Final    Comment: RESULT CALLED TO, READ BACK BY AND VERIFIED WITH: RN Gaspar Skeeters, [id number] (NOTE) SARS-CoV-2 target nucleic acids are DETECTED.  The SARS-CoV-2 RNA is generally detectable in upper respiratory specimens during the acute phase of infection. Positive results are indicative of the presence of the identified virus, but do not rule out bacterial infection or co-infection with other pathogens not detected by the test. Clinical correlation with patient history and other diagnostic information is necessary to determine patient infection status. The expected result is Negative.  Fact Sheet for Patients: EntrepreneurPulse.com.au  Fact Sheet for Healthcare Providers: IncredibleEmployment.be  This test is not yet approved or cleared by the [place] FDA and  has been authorized for detection and/or diagnosis of  SARS-CoV-2 by FDA under an Emergency Use Authorization (EUA).  This EUA will remain in effect (meaning this test can be used)  for the duration of  the COVID-19 declaration under Section 564(b)(1) of the Act, 21 U.S.C. section 360bbb-3(b)(1), unless the authorization is terminated or revoked sooner.     Influenza A by PCR NEGATIVE NEGATIVE Final   Influenza B by PCR NEGATIVE NEGATIVE Final    Comment: (NOTE) The Xpert Xpress SARS-CoV-2/FLU/RSV plus assay is intended as an aid in the diagnosis of influenza from Nasopharyngeal swab specimens and should not be used as a sole basis for treatment. Nasal washings and aspirates are unacceptable for Xpert Xpress SARS-CoV-2/FLU/RSV testing.  Fact Sheet for Patients: EntrepreneurPulse.com.au  Fact Sheet for Healthcare Providers: IncredibleEmployment.be  This test is not yet approved or cleared by the [place] FDA and has been authorized for detection and/or diagnosis of SARS-CoV-2 by FDA under an Emergency Use Authorization (EUA). This EUA will remain in effect (meaning this test can be used) for the duration of the COVID-19 declaration under Section 564(b)(1) of the Act, 21 U.S.C. section 360bbb-3(b)(1), unless the authorization is terminated or revoked.  Performed at [facility], [place] [address] [place], [place] [address]      Radiological Exams on Admission: DG Chest Portable 1 View  Result Date: 06/25/2021 CLINICAL DATA:  Shortness of breath and respiratory distress. EXAM: PORTABLE CHEST 1 VIEW COMPARISON:  09/28/2011 FINDINGS: 0840 hours. The cardio pericardial silhouette is enlarged. There is pulmonary vascular congestion possible interstitial edema. No consolidation. No effusion.Telemetry leads overlie the chest. IMPRESSION: Enlarged cardiopericardial silhouette with pulmonary vascular  congestion and possible interstitial edema. Electronically Signed   By: Misty Stanley M.D.   On:  06/25/2021 08:58    EKG: Independently reviewed.  This tachycardia 144 bpm with QT prolonged at 553 with signs of ST changes appreciated  Assessment/Plan Acute respiratory failure with hypoxia COVID-19 infection: Patient presents with acute worsening shortness of breath found to be hypoxic.  Chest x-ray concerning for enlarge cardiac silhouette with vascular congestion and possible interstitial edema.  BNP was  not significant elevated at 34.2.  At this time question possible causes for his acute respiratory failure including atypical pneumonia secondary to COVID-19 versus aspiration versus PE versus heart failure. -Admit to a progressive bed -COVID-19 order set utilized -Continuous pulse oximetry with oxygen to maintain O2 saturation greater than 92% -Check inflammatory markers and monitor daily -Check CT angiogram of the chest -Remdesivir per pharmacy  -Albuterol inhaler as needed -Antitussives as needed -Pepcid IV   Elevated troponin cardiomegaly: Acute.  High-sensitivity troponin initially 398, but repeat 1150.  Chest x-ray noted enlarged cardiac silhouette.  No prior history reported of heart failure. -Check lipid panel in a.m. -Check echocardiogram -Heparin drip per pharmacy -Cardiology consulted, will follow-up for any further recommendations  Sirs/Sepsis: Acute.  On admission patient was noted to be tachycardic and tachypneic with white blood cell count 11 meeting SIRS criteria.  Lactic acid was reassuring at 1.9, but patient did have acute respiratory failure.  Cause thought to possibly be COVID-19. -Check blood cultures -Recheck CBC tomorrow morning -Rocephin and doxycycline IV added for concern for the possibility of underlying respiratory infection  Acute metabolic encephalopathy: Patient noted to be acutely altered and not responding like her normal self.  Son states that she can normally talk in more complete sentences although she is not always oriented to place and time.   Nursing noted concern for the patient's ability to swallow.  Patient seems to be coming to with IV fluids. -Neurochecks -Speech therapy consulted  Abdominal pain: Acute on chronic.  Patient complaining of abdominal pain but is not able to localize or give much more information. -Check LFTs -Check CT scan of the abdomen pelvis  Essential hypertension: Blood pressures currently stable.  Home blood pressure medications include amlodipine, hydrochlorothiazide 25 mg nightly and losartan 100 mg daily. -Hold home blood pressure regimen -Coreg 3.125 mg twice daily per cardiology  Multiple sclerosis: Patient is bedbound due to history of multiple sclerosis.  Followed by Dr. Jannifer Franklin of neurology in the outpatient setting and is on Avonex.  Unclear if altered mental status is related with an acute infection versus other. -Consider need of neurology consult and/or MRI if patient still noted to be altered  Trigeminal neuralgia -Continue camazepam and Lyrica  Prolonged QT interval: On admission QTC was calculated at 536. -Avoid QT prolonging medications for now -Correct any electrolyte abnormalities -Recheck QTC in a.m.  Dementia: At baseline patient is usually alert and oriented to self and reported to have waxing and waning orientation to place and time per her son.   -Held Aricept due to prolonged QT  Hyperlipidemia -Check lipid panel in a.m. -Continue atorvastatin  Obesity: BMI 31.62 kg/m. DVT prophylaxis: Heparin Code Status: Full Family Communication: Son updated at bedside Disposition Plan: Likely discharge home with son Consults called: Cardiology Admission status: Inpatient, require more than 2 midnight hospital stay due to COVID-19    In the setting of COVID  Norval Morton MD [facility]   If 7PM-7AM, please contact night-coverage   06/25/2021, 11:26 AM

## 2021-06-25 NOTE — ED Notes (Signed)
Attempted IV twice with no success. Pt needs CTA PE work up IV. IV  team consult placed.

## 2021-06-25 NOTE — Progress Notes (Addendum)
RRT paged for STAT bipap. Audible sound coming from upper airway, pt responsive to pain at this time on a NRB with SATs of 96%. RRT nasotracheal suctioned pt to clear airway. A copious amount of thick tan secretions removed from above the vocal cords. Pt was able to cough after being stimulated and became more responsive and closer to her baseline per son. Per CXR results, will hold off on bipap for now per MD since this may be more of an aspiration issue. Ventilation throughout both lungs sounds normal, only fine crackles heard over right middle lobe. Pt was able to be weaned down to 4L [place]. RRT is available for any further treatment.

## 2021-06-25 NOTE — Consult Note (Addendum)
Cardiology Consultation:   Patient ID: [name] MRN: [id number]; DOB: October 08, 1946  Admit date: 06/25/2021 Date of Consult: 06/25/2021  PCP:  Meagan Smoker, MD   [facility] Providers Cardiologist:  None        Patient Profile:   [name] is a 75 y.o. 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 Fuller Plan.  The patient is Covid positive.  History of Present Illness:   [name] has no prior cardiac history. She is followed closely by neurology for her above neurologic issues. She has required medication adjustment for her drowsiness and dementia. She is bedbound. She is now followed by outpatient palliative medicine team. She is a full code. At baseline she is occasionally able to verbalize certain complaints such as her head hurting or wanting something to eat. She otherwise requires assistance with ADLs like dressing and bathing. She has required wound care for intermittent bedsores. She is cared for closely by her son [name] who relays the history. This past week she has not been eating and drinking well. There was a day where she had some vomiting. She also has intermittent abdominal pain. This morning he found her this morning in bed struggling with secretions and foaming at the mouth with labored breathing. She was also more lethargic. Fire dept arrived and found O2 sat 60%. NRB placed with improvement in O2. Upon arrival to the ED, she required suctioning of copious thick tan secretions with question of aspiration issue. She also continues to complain of abdominal pain. No chest pain. She has not had any fevers or chills. Son developed cold symptoms earlier this week but had not self-tested. Here in the ED, labs reveal BNP 34,  troponin 398->1150, leukocytosis of 11k, normal Cr, Covid positive. CXR shows enlarged cardiopericardial silhouette with pulmonary vascular congestion and possible interstitial edema. CT abdomen and CT angio of the chest are pending.  She is tachycardic in the 120s-130s, normotensive.   Per d/w echo reader Dr. Gasper Sells, preliminary echo read shows severe LV dysfunction EF 20-25% with pleural effusion present but trivial pericardial effusion.  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 trigeminal neuralgia Right    left leg DVT Left    STRABISMUS SURGERY     Right eye      Home Medications:  Prior to Admission medications   Medication Sig Start Date End Date Taking? Authorizing Provider  amLODipine (  NORVASC) 10 MG tablet Take 1 tablet (10 mg total) by mouth daily. 11/03/11  Yes Deneise Lever, MD  APPLE CIDER VINEGAR PO Take 450 mg by mouth daily.   Yes [provider]  atorvastatin (LIPITOR) 40 MG tablet Take 40 mg by mouth at bedtime.   Yes [provider]  baclofen (LIORESAL) 10 MG tablet Take 1 tablet (10 mg total) by mouth 4 (four) times daily. 04/29/21  Yes Suzzanne Cloud, NP  buPROPion (WELLBUTRIN XL) 300 MG 24 hr tablet Take 300 mg by mouth daily.    Yes Deneise Lever, MD  carbamazepine (TEGRETOL) 200 MG tablet TAKE 1 TABLET BY MOUTH 3 TIMES A DAY Patient taking differently: Take 200 mg by mouth 3 (three) times daily. TAKE 1 TABLET BY MOUTH 3 TIMES A DAY 04/29/21  Yes Suzzanne Cloud, NP  Cholecalciferol (VITAMIN D-3 PO) Take 1 capsule by mouth daily.   Yes [provider]  CRANBERRY PO Take 1 tablet by mouth daily.   Yes [provider]  Diaper Rash Products (DESITIN CLEAR EX) Apply 1 application topically daily as needed (for skin protection).   Yes [provider]  docusate sodium (COLACE) 100 MG capsule Take 1 capsule (100 mg total) by mouth 2 (two) times daily. Patient taking differently: Take 100 mg by mouth See admin instructions. Take 100 mg by mouth three to four times a week 10/24/18  Yes Mikhail, Maryann, DO  donepezil (ARICEPT) 5 MG tablet Take 1 tablet (5 mg total) by mouth daily. 04/29/21  Yes Suzzanne Cloud, NP  hydrochlorothiazide (HYDRODIURIL) 25 MG tablet Take 25 mg by mouth at bedtime. 12/29/18  Yes [provider]  Interferon Beta-1a (AVONEX PEN) 30 MCG/0.5ML AJKT INJECT 30 MCG INTRAMUSCULARLY ONCE WEEKLY Patient taking differently: every Wednesday. INJECT 30 MCG INTRAMUSCULARLY ONCE WEEKLY 04/29/21  Yes Suzzanne Cloud, NP  KLOR-CON M20 20 MEQ tablet Take 20 mEq by mouth 3 (three) times daily.   Yes [provider]  lamoTRIgine (LAMICTAL) 25 MG tablet Take 25 mg by mouth at bedtime.   Yes [provider]  losartan (COZAAR) 100 MG tablet Take 100 mg by mouth daily in the afternoon.   Yes [provider]  Multiple Vitamins-Minerals (MULTIVITAMIN GUMMIES ADULT) CHEW Chew 2 tablets by mouth every morning.   Yes [provider]  NON FORMULARY Take 1 tablet by mouth See admin instructions. Super Greens tablets- Take 1 tablet by mouth once a day   Yes [provider]  Omega-3 Fatty Acids (FISH OIL) 600 MG CAPS Take 1,200 mg by mouth daily.   Yes [provider]  pregabalin (LYRICA) 100 MG capsule Take 1 capsule (100 mg total) by mouth 2 (two) times daily. Patient taking differently: Take 100 mg by mouth in the morning and at bedtime. 01/07/21  Yes Kathrynn Ducking, MD  Probiotic Product (DIGESTIVE ADV PREBIOT+PROBIOT) CHEW Chew 1 tablet by mouth daily.   Yes [provider]  sertraline (ZOLOFT) 50 MG tablet Take 75 mg by mouth daily.   Yes [provider]  Skin Protectants, Misc. (WHITE PETROLATUM-ZINC OXIDE) cream Apply topically as needed. Patient taking differently: Apply 1 application topically daily as needed for dry skin or wound care. 04/06/17  Yes Lysbeth Penner, FNP  SUPER B COMPLEX/C PO Take 1 tablet by mouth daily.   Yes [provider]  traMADol (ULTRAM) 50 MG tablet TAKE 1 TABLET (50 MG TOTAL) BY MOUTH EVERY 6 (SIX) HOURS AS  NEEDED FOR MODERATE PAIN Patient taking differently: Take 25-50 mg by mouth every 6 (six) hours as needed for moderate pain. 05/20/21  Yes Kathrynn Ducking, MD  TURMERIC PO Take 450 mg by mouth daily.   Yes [provider]  Vitamins A & D (VITAMIN A & D) ointment Apply 1 application topically daily as needed for dry skin (or wound care).   Yes [provider]  acetaminophen (TYLENOL) 500 MG tablet Take 500 mg by mouth every 6 (six) hours as needed for mild pain.  Patient not taking: Reported on 06/25/2021    [provider]    Inpatient Medications: Scheduled Meds:  albuterol  2 puff Inhalation Q6H   vitamin C  500 mg Oral Daily   aspirin  81 mg Oral Once   famotidine  20 mg Oral BID   sodium chloride flush  3 mL Intravenous Q12H   zinc sulfate  220 mg Oral Daily   Continuous Infusions:  heparin 900 Units/hr (06/25/21 1335)   remdesivir 200 mg in sodium chloride 0.9% 250 mL IVPB     Followed by   [START ON 06/26/2021] remdesivir 100 mg in NS 100 mL     PRN Meds: acetaminophen, chlorpheniramine-HYDROcodone, guaiFENesin-dextromethorphan,  ondansetron **OR** ondansetron (ZOFRAN) IV  Allergies:    Allergies  Allergen Reactions   Prednisone Other (See Comments)    Caused thrush- will need something to reverse this   Shellfish Allergy Hives        Wound Dressing Adhesive     Splits the skin    Social History:   Social History   Socioeconomic History   Marital status: Divorced    Spouse name: Not on file   Number of children: 2   Years of education: 16   Highest education level: Not on file  Occupational History   Occupation: disabled Education officer, museum  Tobacco Use   Smoking status: Never   Smokeless tobacco: Never  Vaping Use   Vaping Use: Never used  Substance and Sexual Activity   Alcohol use: No   Drug use: No   Sexual activity: Not Currently    Birth control/protection: Post-menopausal  Other Topics Concern   Not on file  Social History Narrative   Divorced, has a son and daughter. [name] is very involved in her care. [phone] and [phone]   Living with her son, [name] as FT caregiver.  02-09-15 ssy      Patient drinks about 2 sodas daily.   Patient is right handed.    Social Determinants of Health   Financial Resource Strain: Not on file  Food Insecurity: Not on file  Transportation Needs: Not on file  Physical Activity: Not on file  Stress: Not on file  Social Connections: Not on file  Intimate Partner Violence: Not on file    Family History:   Family History  Problem Relation Age of Onset   Multiple sclerosis Sister    Cervical cancer Mother    Liver disease Father    Hyperlipidemia Brother    Pancreatitis Brother    Hypertension Other    Heart disease Other    Uterine cancer Other      ROS:  Please see the history of present illness.  All other ROS reviewed and negative.     Physical Exam/Data:   Vitals:   06/25/21 1300 06/25/21 1315 06/25/21 1330 06/25/21 1345  BP: 116/67 122/70 115/66 115/67  Pulse: (!) 128 (!) 124 (!) 128 (!) 131  Resp: (!) 26 (!) 25 (!) 26 (!) 29  Temp:       TempSrc:      SpO2: 96% 99% 99% 94%  Weight:      Height:        Intake/Output Summary (Last 24 hours) at 06/25/2021 1435 Last data filed at 06/25/2021 1147 Gross per 24 hour  Intake 100 ml  Output --  Net 100 ml   Last 3 Weights 06/25/2021 04/29/2021 06/16/2020  Weight (lbs) 190 lb 0.6 oz (No Data) (No Data)  Weight (kg) 86.2 kg (No Data) (No Data)     Body mass index is 31.62 kg/m.  General: Chronically ill appearing AAF in no acute distress. Head: Normocephalic, atraumatic, sclera non-icteric, no xanthomas, nares are without discharge. Neck: Supple, no carotid bruits Lungs: currently on [place] O2 - rhonchorous BS throughout, no wheezing or rales Heart: Tachycardic, soft heart sounds, without murmurs, rubs, or gallops.  Abdomen: Soft, non-tender, non-distended with normoactive bowel sounds. No rebound/guarding. Extremities: No clubbing or cyanosis. No edema on LLE, + R BKA Neuro: Answers simple questions, initially called her son her brother, otherwise answers with one or two word questions, follows brief commands Psych: Calm/concerned affect  EKG:  The EKG was personally reviewed and demonstrates:    1) Initial tracing sinus tach 144bpm, rounded ST appearance diffusely, very suble STE I, V6, difficult to discern QT interval 2) Sinus tach 128bpm, occasional PVCs, rounded ST appearance diffusely, very subtle STE I, avL, V6, QTc 411m by computer read  Telemetry:  Telemetry was personally reviewed and demonstrates:  sinus tach with occasional PVC  Relevant CV Studies: N/A  Laboratory Data:  High Sensitivity Troponin:   Recent Labs  Lab 06/25/21 0821 06/25/21 1034  TROPONINIHS 398* 1,150*     Chemistry Recent Labs  Lab 06/25/21 0821 06/25/21 0914  NA 136 137  K 3.6 3.4*  CL 100  --   CO2 23  --   GLUCOSE 149*  --   BUN 8  --   CREATININE 0.54  --   CALCIUM 9.3  --   GFRNONAA >60  --   ANIONGAP 13  --    Hematology Recent Labs  Lab 06/25/21 0821 06/25/21 0914   WBC 11.0*  --   RBC 4.07  --   HGB 12.8 12.6  HCT 38.6 37.0  MCV 94.8  --   MCH 31.4  --   MCHC 33.2  --   RDW 13.6  --   PLT 170  --    Thyroid No results for input(s): TSH, FREET4 in the last 168 hours.  BNP Recent Labs  Lab 06/25/21 0821  BNP 34.2    DDimer  Recent Labs  Lab 06/25/21 1153  DDIMER 1.15*     Radiology/Studies:  DG Chest Portable 1 View  Result Date: 06/25/2021 CLINICAL DATA:  Shortness of breath and respiratory distress. EXAM: PORTABLE CHEST 1 VIEW COMPARISON:  09/28/2011 FINDINGS: 0840 hours. The cardio pericardial silhouette is enlarged. There is pulmonary vascular congestion possible interstitial edema. No consolidation. No effusion.Telemetry leads overlie the chest. IMPRESSION: Enlarged cardiopericardial silhouette with pulmonary vascular congestion and possible interstitial edema. Electronically Signed   By: EMisty StanleyM.D.   On: 06/25/2021 08:58   ECHOCARDIOGRAM COMPLETE  Result Date: 06/25/2021    ECHOCARDIOGRAM REPORT   Patient Name:   DAllen KellDate of Exam: 06/25/2021 Medical Rec #:  [id number]        Height:  65.0 in Accession #:    [id number]        Weight:       190.0 lb Date of Birth:  Jul 27, 1946        BSA:          1.936 m Patient Age:    63 years          BP:           116/67 mmHg Patient Gender: F                 HR:           131 bpm. Exam Location:  Inpatient Procedure: 2D Echo, Cardiac Doppler, Color Doppler and Intracardiac            Opacification Agent                           STAT ECHO Reported to: Dr Rudean Haskell on 06/25/2021 1:31:00 PM.                                 MODIFIED REPORT:     This report was modified by Rudean Haskell MD on 06/25/2021 due to                                 [facility] team.  Indications:     Elevated troponin  History:         Patient has no prior history of Echocardiogram examinations.                  Signs/Symptoms:Dyspnea; Risk Factors:Dyslipidemia and                   Hypertension. COIVD-19. Multiple sclerois. Hx DVT.  Sonographer:     Clayton Lefort RDCS (AE) Referring Phys:  [id number] [facility] A SMITH Diagnosing Phys: Rudean Haskell MD IMPRESSIONS  1. Left ventricular ejection fraction, by estimation, is 20 to 25%. The left ventricle has severely decreased function. The left ventricle demonstrates regional wall motion abnormalities (significant LV dysfunction with basal sparing). There is severe asymmetric left ventricular hypertrophy. Left ventricular diastolic parameters are indeterminate.  2. Right ventricular systolic function is normal. The right ventricular size is normal.  3. Moderate pleural effusion.  4. The mitral valve is normal in structure. Mild mitral valve regurgitation.  5. The aortic valve is tricuspid. There is mild thickening of the aortic valve. Aortic valve regurgitation is mild. Comparison(s): No prior Echocardiogram. Conclusion(s)/Recommendation(s): Cardiology has been consulted; they have been made aware. FINDINGS  Left Ventricle: Left ventricular ejection fraction, by estimation, is 20 to 25%. The left ventricle has severely decreased function. The left ventricle demonstrates regional wall motion abnormalities. Definity contrast agent was given IV to delineate the left ventricular endocardial borders. The left ventricular internal cavity size was normal in size. There is severe asymmetric left ventricular hypertrophy. Left ventricular diastolic parameters are indeterminate.  LV Wall Scoring: The apical septal segment, apical inferior segment, and apex are dyskinetic. The mid and distal anterior wall, mid and distal lateral wall, mid anteroseptal segment, mid anterolateral segment, mid inferoseptal segment, and mid inferior segment are hypokinetic. Right Ventricle: The right ventricular size is normal. No increase in right ventricular wall thickness. Right ventricular systolic function is normal. Left Atrium: Left atrial size was normal in size. Right  Atrium: Right atrial size was normal in size. Pericardium: Trivial pericardial effusion is present. Mitral Valve: The mitral valve is normal in structure. Mild mitral valve regurgitation. Tricuspid Valve: The tricuspid valve is normal in structure. Tricuspid valve regurgitation is mild. Aortic Valve: The aortic valve is tricuspid. There is mild thickening of the aortic valve. Aortic valve regurgitation is mild. Aortic regurgitation PHT measures 445 msec. Aortic valve mean gradient measures 2.0 mmHg. Aortic valve peak gradient measures 4.6 mmHg. Aortic valve area, by VTI measures 3.17 cm. Pulmonic Valve: The pulmonic valve was not well visualized. Pulmonic valve regurgitation is not visualized. No evidence of pulmonic stenosis. Aorta: The aortic root and ascending aorta are structurally normal, with no evidence of dilitation. IAS/Shunts: The interatrial septum was not well visualized. Additional Comments: There is a moderate pleural effusion.  LEFT VENTRICLE PLAX 2D LVIDd:         4.00 cm LVIDs:         2.70 cm LV PW:         1.70 cm LV IVS:        1.50 cm LVOT diam:     2.00 cm LV SV:         42 LV SV Index:   22 LVOT Area:     3.14 cm  RIGHT VENTRICLE RV Basal diam:  2.70 cm RV S prime:     19.70 cm/s LEFT ATRIUM           Index       RIGHT ATRIUM           Index LA diam:      2.50 cm 1.29 cm/m  RA Area:     11.80 cm LA Vol (A2C): 45.0 ml 23.25 ml/m RA Volume:   26.80 ml  13.85 ml/m LA Vol (A4C): 41.6 ml 21.49 ml/m  AORTIC VALVE AV Area (Vmax):    2.58 cm AV Area (Vmean):   2.80 cm AV Area (VTI):     3.17 cm AV Vmax:           107.00 cm/s AV Vmean:          70.500 cm/s AV VTI:            0.133 m AV Peak Grad:      4.6 mmHg AV Mean Grad:      2.0 mmHg LVOT Vmax:         87.80 cm/s LVOT Vmean:        62.800 cm/s LVOT VTI:          0.134 m LVOT/AV VTI ratio: 1.01 AI PHT:            445 msec  AORTA Ao Root diam: 3.00 cm Ao Asc diam:  3.60 cm TRICUSPID VALVE TR Peak grad:   25.8 mmHg TR Vmax:        254.00  cm/s  SHUNTS Systemic VTI:  0.13 m Systemic Diam: 2.00 cm Rudean Haskell MD Electronically signed by Rudean Haskell MD Signature Date/Time: 06/25/2021/2:01:30 PM    Final (Updated)      Assessment and Plan:   1. Acute hypoxic respiratory failure, abdominal pain, Covid-19 infection - further management per primary team - CT abdomen, CT angio of chest pending at this time - BNP is normal seemingly out of proportion to hypoxia, suggesting that another process rather than just CHF is at play here so agree with additional workup by medical team. Dr. Gwenlyn Found also reviewed CXR and felt less consistent with interstitial  edema, but left pleural effusion is present  2. Elevated troponin, question NSTEMI versus type 2 demand ischemia - unclear if this represents an ACS versus demand event due to severe hypoxia - will follow-up another troponin value to trend - agree with heparin per pharmacy x 48 hours - consider starting ASA if OK with primary team depending on imaging results - start carvedilol 3.'125mg'$  BID - if unable to take oral, can consider IV metoprolol instead - lipid panel in AM and consider resuming statin if clinical situation appropriate  3. Newly recognized cardiomyopathy - LVEF 20-25% per preliminary read, unclear chronicity - see above for commentary on heart failure - per preliminary discussion with Dr. Gwenlyn Found, would hold home HCTZ, amlodipine and losartan for now and start carvedilol 3.'125mg'$  BID - see below for additional thoughts  4. Sinus tachycardia - likely reactive to underlying process - check thyroid with labs  5. Essential HTN - follow with changes above   Risk Assessment/Risk Scores:     TIMI Risk Score for Unstable Angina or Non-ST Elevation MI:   The patient's TIMI risk score is 2, which indicates a 8% risk of all cause mortality, new or recurrent myocardial infarction or need for urgent revascularization in the next 14 days.  New York Heart Association  (NYHA) Functional Class NYHA Class IV      For questions or updates, please contact [facility] Please consult [email or web address] for contact info under    Signed, Charlie Pitter, PA-C  06/25/2021 2:35 PM   Agree with findings by Burna Mortimer  We are asked to see Ms. Solheim in the ER because of elevated troponin at approximately 1000.  She is a chronically ill bedbound 75 year old African-American female with a history of multiple sclerosis, dementia, status post right BKA who was admitted with hypoxia and found to be COVID-positive.  She had a 2D echo that revealed an EF of 20 to 25%.  Is unclear of the chronicity of this since there is no prior estimation of LV function.  She is on a nonrebreather.  Her chest x-ray shows a left pleural effusion but no significant interstitial infiltrates.  Her BNP is low.  Her EKG shows sinus tachycardia.  I suspect this is type II non-STEMI from demand ischemia.  I do not think she is a candidate for an invasive approach nor do I think she needs a functional study.  I suspect that goals of care should be discussed with the patient if she is in compos mentis and her family.  A palliative care consult would be appropriate.  Given her positive troponins I recommend that she be anticoagulated with IV heparin.  We will see back as needed.   Lorretta Harp, M.D., [place], [facility], [place] [address] [place], [place]  [address]  [phone] 06/25/2021 3:18 PM

## 2021-06-25 NOTE — ED Notes (Signed)
Pending IV team consult for 2nd IV. Meds delayed at this time as heparin drip is not compatible with other meds ordered. Will continue to monitor.

## 2021-06-25 NOTE — ED Notes (Signed)
Spoke to pharmacist and Dr.Smith at this time regarding APTT at 182 result. Per pharmacist, keep heparin drip at same rate this time until next draw.

## 2021-06-25 NOTE — ED Provider Notes (Signed)
[facility] EMERGENCY DEPARTMENT Provider Note   CSN: [id number] Arrival date & time: 06/25/21  0810     History Chief Complaint  Patient presents with   Respiratory Distress    [name] is a 75 y.o. female.  Pt is a 75 yo female with PMH of MS presenting from home after son found her in bed with decreased verbal responsiveness, lethargic, and in respiratory distress. As per EMS patient hypoxic at 60 % at home. No hx of home ox. Son states he also has a cold at home. Denies patient fever, chills, nausea, vomiting, or diarrhea. Pt baseline is Aox3 able to communicate without difficulty.   The history is provided by the patient. No language interpreter was used.      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    Overdose of muscle relaxant    Unintentional baclofen overdose    Trigeminal neuralgia    Trigeminal neuralgia    Right   Wound infection 10/12/2018    Patient Active Problem List   Diagnosis Date Noted   Bedbound 10/13/2018   Cellulitis of right lower extremity    Wound infection 10/12/2018   Subacute osteomyelitis, right ankle and foot ([place])    Neurogenic bladder 06/16/2016   Memory difficulties 12/10/2014   Gait disorder 07/01/2013   Tooth pain 11/02/2011   Protein calorie malnutrition ([place]) 10/07/2011   Constipation 09/23/2011   Failure to thrive in childhood 09/22/2011   Decubitus ulcer, buttock 09/22/2011   Fibroid uterus 09/22/2011   Abdominal pain, lower 09/22/2011   Multiple sclerosis ([place])    Hypertension    Depression    Organic brain syndrome    Trigeminal neuralgia     Past Surgical History:   Procedure Laterality Date   AMPUTATION Right 10/17/2018   Procedure: RIGHT BELOW KNEE AMPUTATION;  Surgeon: [name], MD;  Location: [place];  Service: Orthopedics;  Laterality: Right;   gamma knife procedure for rt. trigeminal neuralgia Right    left leg DVT Left    STRABISMUS SURGERY     Right eye      OB History   No obstetric history on file.     Family History  Problem Relation Age of Onset   Multiple sclerosis Sister    Cervical cancer Mother    Liver disease Father    Hyperlipidemia Brother    Pancreatitis Brother    Hypertension Other    Heart disease Other    Uterine cancer Other     Social History   Tobacco Use   Smoking status: Never   Smokeless tobacco: Never  Vaping Use   Vaping Use: Never used  Substance Use Topics   Alcohol use: No   Drug use: No    Home Medications Prior to Admission medications   Medication Sig Start Date End Date Taking? Authorizing Provider  traMADol (ULTRAM) 50 MG tablet TAKE 1 TABLET (50 MG TOTAL) BY MOUTH EVERY 6 (SIX) HOURS AS NEEDED FOR MODERATE PAIN 05/20/21   Kathrynn Ducking, MD  acetaminophen (TYLENOL) 500  MG tablet Take 500 mg by mouth every 6 (six) hours as needed for mild pain.     [provider]  amLODipine (NORVASC) 10 MG tablet Take 1 tablet (10 mg total) by mouth daily. 11/03/11   Deneise Lever, MD  APPLE CIDER VINEGAR PO Take 1 capsule by mouth daily. '450mg'$     [provider]  atorvastatin (LIPITOR) 40 MG tablet Take 40 mg by mouth daily.    [provider]  B Complex-C (B-COMPLEX WITH VITAMIN C) tablet Take 1 tablet by mouth daily.    [provider]  baclofen (LIORESAL) 10 MG tablet Take 1 tablet (10 mg total) by mouth 4 (four) times daily. 04/29/21   Suzzanne Cloud, NP  buPROPion (WELLBUTRIN XL) 300 MG 24 hr tablet Take 300 mg by mouth daily.    Deneise Lever, MD  carbamazepine (TEGRETOL) 200 MG tablet TAKE 1 TABLET BY MOUTH 3 TIMES A DAY 04/29/21   Suzzanne Cloud, NP   Cholecalciferol ([employer] VIT D 5000 HIGH-POTENCY PO) Take 5,000 Int'l Units/L by mouth daily.    [provider]  CRANBERRY PO Take by mouth.    [provider]  Diaper Rash Products (DESITIN CLEAR EX) Apply topically.    [provider]  docusate sodium (COLACE) 100 MG capsule Take 1 capsule (100 mg total) by mouth 2 (two) times daily. 10/24/18   Mikhail, Velta Addison, DO  donepezil (ARICEPT) 5 MG tablet Take 1 tablet (5 mg total) by mouth daily. 04/29/21   Suzzanne Cloud, NP  FIBER SELECT GUMMIES PO Take 5 g by mouth 2 (two) times daily.     [provider]  fish oil-omega-3 fatty acids 1000 MG capsule Take 2 g by mouth 3 (three) times daily.    Deneise Lever, MD  hydrochlorothiazide (HYDRODIURIL) 25 MG tablet TAKE 1 TABLET BY MOUTH EVERY DAY IN THE MORNING 12/29/18   [provider]  Interferon Beta-1a (AVONEX PEN) 30 MCG/0.5ML AJKT INJECT 30 MCG INTRAMUSCULARLY ONCE WEEKLY 04/29/21   Suzzanne Cloud, NP  lamoTRIgine (LAMICTAL) 25 MG tablet Take 12.5 mg by mouth daily.    [provider]  Multiple Vitamins-Minerals (MULTIVITAMINS THER. W/MINERALS) TABS Take 1 tablet by mouth daily.    [provider]  Potassium Chloride (KLOR-CON PO) Take 20 mEq by mouth 3 (three) times daily.     [provider]  pregabalin (LYRICA) 100 MG capsule Take 1 capsule (100 mg total) by mouth 2 (two) times daily. 01/07/21   Kathrynn Ducking, MD  Probiotic Product (PROBIOTIC PO) Take by mouth.    [provider]  sertraline (ZOLOFT) 50 MG tablet Take 75 mg by mouth daily. One and one half tablet once a day in morning.    [provider]  Skin Protectants, Misc. (WHITE PETROLATUM-ZINC OXIDE) cream Apply topically as needed. Patient taking differently: Apply 1 application topically daily as needed for dry skin or wound care. 04/06/17   Lysbeth Penner, FNP  TURMERIC PO Take 450 mg by mouth.    [provider]    Allergies     Prednisone and Shellfish allergy  Review of Systems   Review of Systems  Constitutional:  Negative for chills and fever.  HENT:  Negative for ear pain and sore throat.   Eyes:  Negative for pain and visual disturbance.  Respiratory:  Positive for shortness of breath. Negative for cough.   Cardiovascular:  Negative for chest pain and palpitations.  Gastrointestinal:  Negative for abdominal pain and vomiting.  Genitourinary:  Negative for dysuria and hematuria.  Musculoskeletal:  Negative for arthralgias and back pain.  Skin:  Negative for color change and rash.  Neurological:  Negative for seizures and syncope.  All other systems reviewed and are negative.  Physical Exam Updated Vital Signs There were no vitals taken for this visit.  Physical Exam Vitals and nursing note reviewed.  Constitutional:      General: She is not in acute distress.    Appearance: She is well-developed. She is morbidly obese.  HENT:     Head: Normocephalic and atraumatic.  Eyes:     Conjunctiva/sclera: Conjunctivae normal.  Cardiovascular:     Rate and Rhythm: Normal rate and regular rhythm.     Heart sounds: No murmur heard. Pulmonary:     Effort: Tachypnea and respiratory distress present.     Breath sounds: Transmitted upper airway sounds present. Examination of the right-upper field reveals rhonchi. Examination of the left-upper field reveals rhonchi. Examination of the right-middle field reveals rhonchi. Examination of the left-middle field reveals rhonchi. Examination of the right-lower field reveals rhonchi. Examination of the left-lower field reveals rhonchi. Rhonchi present.  Abdominal:     Palpations: Abdomen is soft.     Tenderness: There is no abdominal tenderness.  Musculoskeletal:     Cervical back: Neck supple.       Legs:  Skin:    General: Skin is warm and dry.  Neurological:     Mental Status: She is alert.     GCS: GCS eye subscore is 3. GCS verbal subscore is 2. GCS motor  subscore is 4.     Comments: Left lower extremity contractures    ED Results / Procedures / Treatments   Labs (all labs ordered are listed, but only abnormal results are displayed) Labs Reviewed - No data to display  EKG None  Radiology No results found.  Procedures .Critical Care Performed by: Lianne Cure, DO Authorized by: Lianne Cure, DO   Critical care provider statement:    Critical care time (minutes):  45   Critical care was time spent personally by me on the following activities:  Discussions with consultants, evaluation of patient's response to treatment, examination of patient, ordering and performing treatments and interventions, ordering and review of laboratory studies, ordering and review of radiographic studies, pulse oximetry, re-evaluation of patient's condition, obtaining history from patient or surrogate and review of old charts   Medications Ordered in ED Medications  ipratropium-albuterol (DUONEB) 0.5-2.5 (3) MG/3ML nebulizer solution 3 mL (has no administration in time range)    ED Course  I have reviewed the triage vital signs and the nursing notes.  Pertinent labs & imaging results that were available during my care of the patient were reviewed by me and considered in my medical decision making (see chart for details).    MDM Rules/Calculators/A&P                          8:22 AM 75 yo female with PMH of MS presenting from home after son found her in bed with decreased verbal responsiveness, lethargic, and in respiratory distress. On exam patient is lethargic, GCS 9, upper airway transmission, wet, tachypneic, tachycardic, satting at 90 % on non rebreather. Oxygenation improved to 97 % after multiple rounds of suctioning. Pt able to be de-escalated to [place].   Labs pertinent for Covid pneumonia with hypoxia. Decadron given per recommendation  guidelines to decrease mortality risk. Troponin elevated but likely secondary to ischemic demand. ASA  ordered. EKG stable with no st segment changes. Reviewed by cardiology as well. I spoke with hospitalist who agrees to accept patient.       Final Clinical Impression(s) / ED Diagnoses Final diagnoses:  COVID  Acute respiratory failure with hypoxia ([place])  MS (multiple sclerosis) ([place])  Elevated troponin    Rx / DC Orders ED Discharge Orders     None        Lianne Cure, DO [id number]

## 2021-06-25 NOTE — Progress Notes (Signed)
Pharmacy Anticoagulation Note:  Patient is currently on IV heparin. An aPTT drawn this afternoon is 182 but was drawn too early to make any reasonable interpretation. Will redraw an anti-Xa level at 10 PM. Will continue IV heparin at current. Discussed with RN.   Albertina Parr, PharmD., BCPS, BCCCP Clinical Pharmacist Please refer to [facility] for unit-specific pharmacist

## 2021-06-26 DIAGNOSIS — L8990 Pressure ulcer of unspecified site, unspecified stage: Secondary | ICD-10-CM | POA: Insufficient documentation

## 2021-06-26 DIAGNOSIS — J9601 Acute respiratory failure with hypoxia: Secondary | ICD-10-CM | POA: Diagnosis not present

## 2021-06-26 LAB — URINALYSIS, ROUTINE W REFLEX MICROSCOPIC
Bilirubin Urine: NEGATIVE
Glucose, UA: NEGATIVE mg/dL
Hgb urine dipstick: NEGATIVE
Ketones, ur: 5 mg/dL — AB
Nitrite: NEGATIVE
Protein, ur: 100 mg/dL — AB
Specific Gravity, Urine: 1.035 — ABNORMAL HIGH (ref 1.005–1.030)
pH: 6 (ref 5.0–8.0)

## 2021-06-26 LAB — CBC WITH DIFFERENTIAL/PLATELET
Abs Immature Granulocytes: 0.03 10*3/uL (ref 0.00–0.07)
Basophils Absolute: 0 10*3/uL (ref 0.0–0.1)
Basophils Relative: 0 %
Eosinophils Absolute: 0 10*3/uL (ref 0.0–0.5)
Eosinophils Relative: 0 %
HCT: 35.5 % — ABNORMAL LOW (ref 36.0–46.0)
Hemoglobin: 12.3 g/dL (ref 12.0–15.0)
Immature Granulocytes: 0 %
Lymphocytes Relative: 14 %
Lymphs Abs: 1.4 10*3/uL (ref 0.7–4.0)
MCH: 31.5 pg (ref 26.0–34.0)
MCHC: 34.6 g/dL (ref 30.0–36.0)
MCV: 91 fL (ref 80.0–100.0)
Monocytes Absolute: 0.8 10*3/uL (ref 0.1–1.0)
Monocytes Relative: 8 %
Neutro Abs: 7.3 10*3/uL (ref 1.7–7.7)
Neutrophils Relative %: 78 %
Platelets: 157 10*3/uL (ref 150–400)
RBC: 3.9 MIL/uL (ref 3.87–5.11)
RDW: 13.8 % (ref 11.5–15.5)
WBC: 9.5 10*3/uL (ref 4.0–10.5)
nRBC: 0 % (ref 0.0–0.2)

## 2021-06-26 LAB — COMPREHENSIVE METABOLIC PANEL
ALT: 18 U/L (ref 0–44)
AST: 35 U/L (ref 15–41)
Albumin: 2.9 g/dL — ABNORMAL LOW (ref 3.5–5.0)
Alkaline Phosphatase: 86 U/L (ref 38–126)
Anion gap: 14 (ref 5–15)
BUN: 10 mg/dL (ref 8–23)
CO2: 22 mmol/L (ref 22–32)
Calcium: 8.6 mg/dL — ABNORMAL LOW (ref 8.9–10.3)
Chloride: 103 mmol/L (ref 98–111)
Creatinine, Ser: 0.54 mg/dL (ref 0.44–1.00)
GFR, Estimated: >60 mL/min (ref >60)
Glucose, Bld: 133 mg/dL — ABNORMAL HIGH (ref 70–99)
Potassium: 2.7 mmol/L — CL (ref 3.5–5.1)
Sodium: 139 mmol/L (ref 135–145)
Total Bilirubin: 0.7 mg/dL (ref 0.3–1.2)
Total Protein: 6.2 g/dL — ABNORMAL LOW (ref 6.5–8.1)

## 2021-06-26 LAB — FERRITIN: Ferritin: 161 ng/mL (ref 11–307)

## 2021-06-26 LAB — LIPID PANEL
Cholesterol: 175 mg/dL (ref 0–200)
HDL: 45 mg/dL (ref >40)
LDL Cholesterol: 107 mg/dL — ABNORMAL HIGH (ref 0–99)
Total CHOL/HDL Ratio: 3.9 RATIO
Triglycerides: 117 mg/dL (ref <150)
VLDL: 23 mg/dL (ref 0–40)

## 2021-06-26 LAB — HEPARIN LEVEL (UNFRACTIONATED): Heparin Unfractionated: 0.65 IU/mL (ref 0.30–0.70)

## 2021-06-26 LAB — MAGNESIUM: Magnesium: 1.8 mg/dL (ref 1.7–2.4)

## 2021-06-26 LAB — D-DIMER, QUANTITATIVE: D-Dimer, Quant: 1.18 ug/mL-FEU — ABNORMAL HIGH (ref 0.00–0.50)

## 2021-06-26 LAB — C-REACTIVE PROTEIN: CRP: 23.3 mg/dL — ABNORMAL HIGH (ref <1.0)

## 2021-06-26 LAB — PHOSPHORUS: Phosphorus: 2.5 mg/dL (ref 2.5–4.6)

## 2021-06-26 MED ORDER — LISINOPRIL 5 MG PO TABS
2.5000 mg | ORAL_TABLET | Freq: Every day | ORAL | Status: DC
  Administered 2021-06-26 – 2021-06-29 (×4): 2.5 mg via ORAL
  Filled 2021-06-26 (×4): qty 1

## 2021-06-26 MED ORDER — MORPHINE SULFATE (PF) 2 MG/ML IV SOLN
1.0000 mg | Freq: Once | INTRAVENOUS | Status: AC
  Administered 2021-06-26: 1 mg via INTRAVENOUS
  Filled 2021-06-26: qty 1

## 2021-06-26 MED ORDER — ORAL CARE MOUTH RINSE
15.0000 mL | Freq: Two times a day (BID) | OROMUCOSAL | Status: DC
  Administered 2021-06-26 – 2021-06-29 (×7): 15 mL via OROMUCOSAL

## 2021-06-26 MED ORDER — MORPHINE SULFATE (PF) 2 MG/ML IV SOLN
2.0000 mg | INTRAVENOUS | Status: DC | PRN
  Administered 2021-06-26: 2 mg via INTRAVENOUS
  Filled 2021-06-26 (×2): qty 1

## 2021-06-26 MED ORDER — POTASSIUM CHLORIDE CRYS ER 20 MEQ PO TBCR
40.0000 meq | EXTENDED_RELEASE_TABLET | ORAL | Status: AC
  Filled 2021-06-26: qty 2

## 2021-06-26 MED ORDER — POTASSIUM PHOSPHATES 15 MMOLE/5ML IV SOLN
15.0000 mmol | Freq: Once | INTRAVENOUS | Status: AC
  Administered 2021-06-26: 15 mmol via INTRAVENOUS
  Filled 2021-06-26: qty 5

## 2021-06-26 MED ORDER — POTASSIUM CHLORIDE 10 MEQ/100ML IV SOLN
10.0000 meq | INTRAVENOUS | Status: AC
  Administered 2021-06-26 (×4): 10 meq via INTRAVENOUS
  Filled 2021-06-26 (×4): qty 100

## 2021-06-26 MED ORDER — DEXAMETHASONE 6 MG PO TABS
6.0000 mg | ORAL_TABLET | Freq: Every day | ORAL | Status: DC
  Administered 2021-06-26 – 2021-06-29 (×4): 6 mg via ORAL
  Filled 2021-06-26 (×4): qty 1

## 2021-06-26 MED ORDER — FAMOTIDINE 20 MG IN NS 100 ML IVPB
20.0000 mg | Freq: Two times a day (BID) | INTRAVENOUS | Status: DC
  Administered 2021-06-26 – 2021-06-29 (×5): 20 mg via INTRAVENOUS
  Filled 2021-06-26 (×8): qty 100

## 2021-06-26 NOTE — Progress Notes (Signed)
Very Large stool after SMOG enema. Dressing changed to sacrum. Son reports patient's mentality and confusion back to baseline. Patient denies chest pain and pain of any kind. Pericare provided. Patient stable and displays no s/s of distress. Unable to collect urinalysis due to 2 episodes of incontinence. Will collect urinalysis as soon as possible. Purewick placed.

## 2021-06-26 NOTE — Progress Notes (Signed)
   06/25/21 2005  Assess: MEWS Score  Temp 99.4 F (37.4 C)  BP 122/70  Pulse Rate (!) 115  ECG Heart Rate (!) 115  Resp (!) 21  SpO2 99 %  O2 Device Nasal Cannula  Patient Activity (if Appropriate) In bed  O2 Flow Rate (L/min) 2 L/min  Assess: MEWS Score  MEWS Temp 0  MEWS Systolic 0  MEWS Pulse 2  MEWS RR 1  MEWS LOC 0  MEWS Score 3  MEWS Score Color Yellow  Assess: if the MEWS score is Yellow or Red  Were vital signs taken at a resting state? Yes  Focused Assessment No change from prior assessment  Early Detection of Sepsis Score *See Row Information* Low  MEWS guidelines implemented *See Row Information* No, previously yellow, continue vital signs every 4 hours  Treat  MEWS Interventions Administered scheduled meds/treatments  Pain Scale 0-10  Pain Score 0  Complains of Gas  Gas relieved by Other (Comment) (enema)  Patients response to intervention Relief  Take Vital Signs  Increase Vital Sign Frequency   (Yellow MEWs prior to now;monitor VS every 4hours)  Document  Patient Outcome Other (Comment) (stable)  Progress note created (see row info) Yes

## 2021-06-26 NOTE — Progress Notes (Addendum)
ANTICOAGULATION CONSULT NOTE - Initial Consult  Pharmacy Consult for heparin Indication: chest pain/ACS  Allergies  Allergen Reactions   Prednisone Other (See Comments)    Caused thrush- will need something to reverse this   Shellfish Allergy Hives        Wound Dressing Adhesive     Splits the skin    Patient Measurements: Height: '5\' 4"'$  (162.6 cm) Weight: 69 kg (152 lb 1.9 oz) IBW/kg (Calculated) : 54.7 Heparin Dosing Weight: 75.7 kg  Vital Signs: Temp: 98.1 F (36.7 C) (09/17 0500) Temp Source: Oral (09/17 0500) BP: 121/68 (09/17 0600) Pulse Rate: 106 (09/17 0600)  Labs: Recent Labs    06/25/21 0821 06/25/21 0914 06/25/21 1034 06/25/21 1153 06/25/21 1618 06/25/21 1621 06/25/21 2100 06/26/21 0437  HGB 12.8 12.6  --   --   --   --   --  12.3  HCT 38.6 37.0  --   --   --   --   --  35.5*  PLT 170  --   --   --   --   --   --  157  APTT  --   --   --  >200*  --  182*  --   --   LABPROT  --   --   --  16.3*  --  14.7  --   --   INR  --   --   --  1.3*  --  1.2  --   --   HEPARINUNFRC  --   --   --   --   --   --  0.53 0.65  CREATININE 0.54  --   --   --   --   --   --  0.54  TROPONINIHS 398*  --  1,150*  --  2,170*  --   --   --      Estimated Creatinine Clearance: 60.6 mL/min (by C-G formula based on SCr of 0.54 mg/dL).   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 Found to have troponin elevation. Heparin consult for ACS.  No AC PTA. CBC stable.  Heparin level 0.65 (on 900units/hr). No signs or symptoms of bleeding per nursing.   Goal of Therapy Heparin level 0.3-0.7 units/ml Monitor platelets by anticoagulation protocol: Yes   Plan:  Continue heparin drip at 900 units/hr Daily heparin level and CBC ordered   Thank you for allowing pharmacy to participate in this patient's care.  Reatha Harps, PharmD PGY1 Pharmacy Resident 06/26/2021 7:16 AM Check [email or web address] for unit specific pharmacy number  ADDENDUM: Nursing reached out to inform me of dark stool, suspected due to hemorrhoids. MD notified, will continue with  previous plan.  Thank you for allowing pharmacy to participate in this patient's care.  Reatha Harps, PharmD PGY1 Pharmacy Resident 06/26/2021 8:35 AM Check [email or web address] for unit specific pharmacy number

## 2021-06-26 NOTE — Evaluation (Addendum)
Clinical/Bedside Swallow Evaluation Patient Details  Name: [name] MRN: [id number] Date of Birth: Oct 30, 1945  Today's Date: 06/26/2021 Time: SLP Start Time (ACUTE ONLY): 0830 SLP Stop Time (ACUTE ONLY): 0845 SLP Time Calculation (min) (ACUTE ONLY): 15 min  Past Medical History:  Past Medical History:  Diagnosis Date   Abnormality of gait    Dementia ([place])    Depression    DVT (deep venous thrombosis) (HCC)    Left leg   Dyslipidemia    Fibroids    Hypertension    Memory difficulties 12/10/2014   Multiple sclerosis ([place])    Right sided weakness and gait disorder, bladder and bowel problems    Neurogenic bladder    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 trigeminal neuralgia Right    left leg DVT Left    STRABISMUS SURGERY     Right eye    HPI:  [name] is a 75 y.o. female with medical history significant of MS who is bedbound at baseline, hypertension, dyslipidemia, neurogenic bladder, trigeminal neuralgia, and DVT in 2013 no longer on anticoagulation who presents after being noted to be acutely altered morning of 06/25/21.   Assessment / Plan / Recommendation  Clinical Impression  [name] presents with respiratory failure in setting of acute COVID-19 infection as well as chronic multiple sclerosis. Chest CT is concerning for aspiration; however, she demonstrated no s/s aspiration at bedside this morning. Mentation is altered and she required cues to participate, but consumed 3 oz thin liquid water without difficulty as well as applesauce and graham cracker. Oral  mech exam revealed generalized weakness with no asymmetry or abnormalities otherwise noted. Plan to initiate oral diet of dys 3 and thin liquids with completion of objective swallow study when able.  Mentation is fluctuating significantly, so if pt begins to demonstrate s/s aspiration or is too altered to participate in PO intake, do not feed. Diligent oral care is imperative TID.  SLP Visit Diagnosis: Dysphagia, unspecified (R13.10)    Aspiration Risk  Moderate aspiration risk    Diet Recommendation Dysphagia 3 (Mech soft);Thin liquid   Liquid Administration via: Straw Medication Administration: Crushed with puree Supervision: Staff to assist with self feeding;Full supervision/cueing for compensatory strategies Compensations: Small sips/bites Postural Changes: Seated upright at 90 degrees    Other  Recommendations Oral Care Recommendations: Oral care before and after PO    Recommendations for follow up therapy are one component of a multi-disciplinary discharge planning process, led by the attending physician.  Recommendations may be updated based on patient status, additional functional criteria and insurance authorization.  Follow up Recommendations Skilled Nursing facility      Frequency and Duration min 2x/week  1 week       Prognosis Prognosis for Safe Diet Advancement: [name] Date of Onset: 06/26/21 HPI: [name] is a 75 y.o. female with medical history significant of MS who is bedbound at baseline, hypertension, dyslipidemia, neurogenic bladder, trigeminal neuralgia, and DVT in 2013 no longer  on anticoagulation who presents after being noted to be acutely altered this morning Type of Study: Bedside Swallow Evaluation Previous Swallow Assessment: failed screening yesterday Diet Prior to this Study: NPO Temperature Spikes Noted: No Respiratory Status: Nasal cannula History of Recent Intubation: No Behavior/Cognition:  Alert;Cooperative;Confused Oral Cavity Assessment: Within Functional Limits Oral Cavity - Dentition: Adequate natural dentition Self-Feeding Abilities: Total assist Patient Positioning: Upright in bed Baseline Vocal Quality: Normal Volitional Cough: Weak Volitional Swallow: Unable to elicit    Oral/Motor/Sensory Function Overall Oral Motor/Sensory Function: Generalized oral weakness   Ice Chips Ice chips: Not tested   Thin Liquid Thin Liquid: Within functional limits Presentation: Straw;Cup    Nectar Thick Nectar Thick Liquid: Not tested   Honey Thick Honey Thick Liquid: Not tested   Puree Puree: Within functional limits Presentation: Spoon   Solid     Solid: Within functional limits Presentation: Parker. Hosam Mcfetridge, M.S., CCC-SLP Speech-Language Pathologist Acute Rehabilitation Services Pager: [place] 06/26/2021,9:41 AM

## 2021-06-26 NOTE — Progress Notes (Signed)
I&O cath completed. 500 out and urinalysis sample collected.

## 2021-06-26 NOTE — Progress Notes (Addendum)
PROGRESS NOTE    CARENA STREAM  DPO:[id number] DOB: 1946/04/05 DOA: 06/25/2021 PCP: Glenis Smoker, MD   Brief Narrative:  HPI: [name] is a 75 y.o. female with medical history significant of MS who is bedbound at baseline, hypertension, dyslipidemia, neurogenic bladder, trigeminal neuralgia, and DVT in 2013 no longer on anticoagulation who presents after being noted to be acutely altered this morning.  History is obtained from her son who is present at bedside.  Patient was noted by her son to be more lethargic this morning with increased work of breathing and foaming at the mouth.  The patient was not responding to him like normal concerned and called EMS.  Normally the patient is alert, but orientation to time and place sometimes waxes and wanes.  Usually she can at least talk in complete phrases/sentences.  The patient's son had recently come down with a productive cough earlier in the week and was productive.  His mother had feeling under the weather and not eating or drinking much for the last 3 days. She currently complains of abdominal pain, but is not able to say much more than.  The patient has been vaccinated for COVID and boosted x2 due to her history of MS. Son notes that she is no longer on anticoagulation, but is not totally clear on why. Upon EMS arrival patient was noted to be hypoxic into the 60s for which she was placed on nonrebreather with improvement in O2 saturations to 99%.   ED Course: Upon admission to the emergency department patient was seen to be afebrile, pulse 125-150, respirations 19-32, blood pressures maintained, and O2 saturations noted as low as 89% initially placed on nonrebreather with improvement in O2 saturations.  After suctioning patient able to be weaned down to 3 L of nasal cannula oxygen.  Labs significant for WBC 11, BNP 34.2, troponin 398, and lactic acid 1.9.  Chest x-ray noted enlarged cardio pericardial silhouette with pulmonary  vascular congestion and possible interstitial edema.  COVID-19 screening was positive.  Case had been discussed with cardiology due to the elevated troponin and it was thought symptoms could likely be secondary to demand and recommended hospitalist admission.  Patient had been given 81 mg of aspirin p.o., 10 mEq of potassium chloride IV, and started on normal saline at 100 mL/h for 1 L.    Assessment & Plan:   Principal Problem:   Acute respiratory failure with hypoxia (HCC) Active Problems:   Multiple sclerosis (HCC)   Hypertension   Trigeminal neuropathy   Abdominal pain   Acute metabolic encephalopathy   COVID-19 virus infection   Prolonged QT interval   SIRS (systemic inflammatory response syndrome) (HCC)   Elevated troponin   Cardiomegaly   Pressure injury of skin  Severe sepsis secondary to acute respiratory failure with hypoxia COVID-19 infection/aspiration pneumonia: Patient met criteria for severe sepsis based on tachycardia, tachypnea and hypoxia with bacterial pneumonia. patient better than yesterday.  Currently only on 2 L oxygen.  COVID-positive.  CRP elevated.  Also procalcitonin elevated indicates possible bacterial pneumonia.  We will continue remdesivir, Rocephin and doxycycline, bronchodilators and add dexamethasone.  Patient was encouraged to prone, out of bed to chair, to use incentive spirometry and flutter valve.  Since patient is improving, no indication of Actemra or baricitinib.   Elevated troponin cardiomegaly: Acute.  High-sensitivity troponin initially 398, but repeat 1150.  Chest x-ray noted enlarged cardiac silhouette.  No prior history reported of heart failure.  Lipid panel within  normal range.  Echo shows LVEF of 20 to 25% and regional wall motion abnormalities.  Seen by cardiology.  They opined that she likely has a stress cardiomyopathy and have not recommended any intervention but recommended medical management.  Cardiology signed off.   Acute  metabolic/toxic encephalopathy: She was encephalopathy likely secondary to sepsis and infection.  Currently alert and oriented x2-3 and back to her baseline which is verified by the son.  Hypokalemia: We will replace.   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  CT abdomen suggests possible stercoral colitis and fecal impaction.  Also has uterine fibroid.  Per chart review, she has had 4 bowel movements since admission.  No abdominal pain today.    Essential hypertension: Home blood pressure medications include amlodipine, hydrochlorothiazide 25 mg nightly and losartan 100 mg daily.  All medications but Coreg on hold and blood pressure stable   Multiple sclerosis: Patient is bedbound due to history of multiple sclerosis.  Followed by Dr. Jannifer Franklin of neurology in the outpatient setting and is on Avonex.    Trigeminal neuralgia -Continue camazepam and Lyrica   Prolonged QT interval: On admission QTC was calculated at 536.  EKG today shows QTC of 486, improved. -Avoid QT prolonging medications for now -Correct any electrolyte abnormalities -Recheck QTC in a.m.   Dementia: At baseline now.  Continue to hold Aricept for now.   Hyperlipidemia: Continue atorvastatin.  DVT prophylaxis:    Code Status: Full Code  Family Communication:  None present at bedside.  Plan of care discussed with son over the phone  Status is: Inpatient  Remains inpatient appropriate because:IV treatments appropriate due to intensity of illness or inability to take PO  Dispo: The patient is from: Home              Anticipated d/c is to: Home              Patient currently is not medically stable to d/c.   Difficult to place patient No        Estimated body mass index is 26.11 kg/m as calculated from the following:   Height as of this encounter: '5\' 4"'  (1.626 m).   Weight as of this encounter: 69 kg.  Pressure Injury 06/25/21 Sacrum Mid Stage 2 -  Partial thickness loss of dermis  presenting as a shallow open injury with a red, pink wound bed without slough. Healing;Wound Care at home;dry,flaky,slight bleeding (Active)  06/25/21 2015  Location: Sacrum  Location Orientation: Mid  Staging: Stage 2 -  Partial thickness loss of dermis presenting as a shallow open injury with a red, pink wound bed without slough.  Wound Description (Comments): Healing;Wound Care at home;dry,flaky,slight bleeding  Present on Admission: Yes    Nutritional Assessment: Body mass index is 26.11 kg/m.Marland Kitchen Seen by dietician.  I agree with the assessment and plan as outlined below: Nutrition Status:        .  Skin Assessment: I have examined the patient's skin and I agree with the wound assessment as performed by the wound care RN as outlined below: Pressure Injury 06/25/21 Sacrum Mid Stage 2 -  Partial thickness loss of dermis presenting as a shallow open injury with a red, pink wound bed without slough. Healing;Wound Care at home;dry,flaky,slight bleeding (Active)  06/25/21 2015  Location: Sacrum  Location Orientation: Mid  Staging: Stage 2 -  Partial thickness loss of dermis presenting as a shallow open injury with a red, pink wound bed without slough.  Wound Description (  Comments): Healing;Wound Care at home;dry,flaky,slight bleeding  Present on Admission: Yes    Consultants:  Cardiology-signed off  Procedures:  None  Antimicrobials:  Anti-infectives (From admission, onward)    Start     Dose/Rate Route Frequency Ordered Stop   06/26/21 1000  remdesivir 100 mg in sodium chloride 0.9 % 100 mL IVPB       See Hyperspace for full Linked Orders Report.   100 mg 200 mL/hr over 30 Minutes Intravenous Daily 06/25/21 1155 06/30/21 0959   06/25/21 2000  cefTRIAXone (ROCEPHIN) 2 g in sodium chloride 0.9 % 100 mL IVPB        2 g 200 mL/hr over 30 Minutes Intravenous Every 24 hours 06/25/21 1857     06/25/21 2000  doxycycline (VIBRAMYCIN) 100 mg in sodium chloride 0.9 % 250 mL IVPB         100 mg 125 mL/hr over 120 Minutes Intravenous Every 12 hours 06/25/21 1902     06/25/21 1200  remdesivir 200 mg in sodium chloride 0.9% 250 mL IVPB       See Hyperspace for full Linked Orders Report.   200 mg 580 mL/hr over 30 Minutes Intravenous Once 06/25/21 1155 06/25/21 1727          Subjective: Patient seen and examined.  She is alert and oriented x3.  Denies any complaint.  Objective: Vitals:   06/26/21 0800 06/26/21 0807 06/26/21 0900 06/26/21 1206  BP: 129/71 129/71 132/70 (!) 102/56  Pulse: (!) 104 (!) 103 (!) 107 (!) 105  Resp: (!) 29 (!) 27 (!) 22 20  Temp:  98.3 F (36.8 C)    TempSrc:  Oral  Oral  SpO2: 98% 96% 98% 98%  Weight:      Height:        Intake/Output Summary (Last 24 hours) at 06/26/2021 1413 Last data filed at 06/26/2021 0700 Gross per 24 hour  Intake 1587.87 ml  Output 500 ml  Net 1087.87 ml   Filed Weights   06/25/21 0819 06/25/21 2006 06/26/21 0500  Weight: 86.2 kg 69.4 kg 69 kg    Examination:  General exam: Appears calm and comfortable  Respiratory system: Coarse breath sounds with rhonchi bilaterally. Respiratory effort normal. Cardiovascular system: S1 & S2 heard, RRR. No JVD, murmurs, rubs, gallops or clicks. No pedal edema. Gastrointestinal system: Abdomen is nondistended, soft and nontender. No organomegaly or masses felt. Normal bowel sounds heard. Central nervous system: Alert and oriented x3.  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 Extremities: Symmetric 5 x 5 power. Skin: No rashes, lesions or ulcers   Data Reviewed: I have personally reviewed following labs and imaging studies  CBC: Recent Labs  Lab 06/25/21 0821 06/25/21 0914 06/26/21 0437  WBC 11.0*  --  9.5  NEUTROABS 9.0*  --  7.3  HGB 12.8 12.6 12.3  HCT 38.6 37.0 35.5*  MCV 94.8  --  91.0  PLT 170  --  035   Basic Metabolic Panel: Recent Labs  Lab 06/25/21 0821 06/25/21 0914 06/26/21 0437  NA 136 137 139  K 3.6 3.4* 2.7*  CL 100  --  103  CO2 23  --  22   GLUCOSE 149*  --  133*  BUN 8  --  10  CREATININE 0.54  --  0.54  CALCIUM 9.3  --  8.6*  MG  --   --  1.8  PHOS  --   --  2.5   GFR: Estimated Creatinine Clearance: 60.6 mL/min (by C-G formula  based on SCr of 0.54 mg/dL). Liver Function Tests: Recent Labs  Lab 06/25/21 1618 06/26/21 0437  AST 31 35  ALT 18 18  ALKPHOS 89 86  BILITOT 0.8 0.7  PROT 6.2* 6.2*  ALBUMIN 3.0* 2.9*   No results for input(s): LIPASE, AMYLASE in the last 168 hours. No results for input(s): AMMONIA in the last 168 hours. Coagulation Profile: Recent Labs  Lab 06/25/21 1153 06/25/21 1621  INR 1.3* 1.2   Cardiac Enzymes: No results for input(s): CKTOTAL, CKMB, CKMBINDEX, TROPONINI in the last 168 hours. BNP (last 3 results) No results for input(s): PROBNP in the last 8760 hours. HbA1C: No results for input(s): HGBA1C in the last 72 hours. CBG: No results for input(s): GLUCAP in the last 168 hours. Lipid Profile: Recent Labs    06/26/21 0437  CHOL 175  HDL 45  LDLCALC 107*  TRIG 117  CHOLHDL 3.9   Thyroid Function Tests: Recent Labs    06/25/21 1621  TSH 1.178   Anemia Panel: Recent Labs    06/25/21 1153 06/26/21 0437  FERRITIN 114 161   Sepsis Labs: Recent Labs  Lab 06/25/21 0821 06/25/21 1034 06/25/21 1153  PROCALCITON  --   --  1.95  LATICACIDVEN 1.9 1.9  --     Recent Results (from the past 240 hour(s))  Resp Panel by RT-PCR (Flu A&B, Covid) Nasopharyngeal Swab     Status: Abnormal   Collection Time: 06/25/21  8:20 AM   Specimen: Nasopharyngeal Swab; Nasopharyngeal(NP) swabs in vial transport medium  Result Value Ref Range Status   SARS Coronavirus 2 by RT PCR POSITIVE (A) NEGATIVE Final    Comment: RESULT CALLED TO, READ BACK BY AND VERIFIED WITH: RN Gaspar Skeeters, [id number] (NOTE) SARS-CoV-2 target nucleic acids are DETECTED.  The SARS-CoV-2 RNA is generally detectable in upper respiratory specimens during the acute phase of infection. Positive results  are indicative of the presence of the identified virus, but do not rule out bacterial infection or co-infection with other pathogens not detected by the test. Clinical correlation with patient history and other diagnostic information is necessary to determine patient infection status. The expected result is Negative.  Fact Sheet for Patients: EntrepreneurPulse.com.au  Fact Sheet for Healthcare Providers: IncredibleEmployment.be  This test is not yet approved or cleared by the [place] FDA and  has been authorized for detection and/or diagnosis of SARS-CoV-2 by FDA under an Emergency Use Authorization (EUA).  This EUA will remain in effect (meaning this test can be used)  for the duration of  the COVID-19 declaration under Section 564(b)(1) of the Act, 21 U.S.C. section 360bbb-3(b)(1), unless the authorization is terminated or revoked sooner.     Influenza A by PCR NEGATIVE NEGATIVE Final   Influenza B by PCR NEGATIVE NEGATIVE Final    Comment: (NOTE) The Xpert Xpress SARS-CoV-2/FLU/RSV plus assay is intended as an aid in the diagnosis of influenza from Nasopharyngeal swab specimens and should not be used as a sole basis for treatment. Nasal washings and aspirates are unacceptable for Xpert Xpress SARS-CoV-2/FLU/RSV testing.  Fact Sheet for Patients: EntrepreneurPulse.com.au  Fact Sheet for Healthcare Providers: IncredibleEmployment.be  This test is not yet approved or cleared by the [place] FDA and has been authorized for detection and/or diagnosis of SARS-CoV-2 by FDA under an Emergency Use Authorization (EUA). This EUA will remain in effect (meaning this test can be used) for the duration of the COVID-19 declaration under Section 564(b)(1) of the Act, 21 U.S.C. section 360bbb-3(b)(1), unless the  authorization is terminated or revoked.  Performed at [facility], [place] [address]  [place], [place] [address]   Culture, blood (routine x 2)     Status: None (Preliminary result)   Collection Time: 06/25/21  2:47 PM   Specimen: BLOOD LEFT FOREARM  Result Value Ref Range Status   Specimen Description BLOOD LEFT FOREARM  Final   Special Requests   Final    BOTTLES DRAWN AEROBIC AND ANAEROBIC Blood Culture results may not be optimal due to an inadequate volume of blood received in culture bottles   Culture   Final    NO GROWTH < 24 HOURS Performed at [facility], [place] [address] [place], [place] [address]    Report Status PENDING  Incomplete  Culture, blood (routine x 2)     Status: None (Preliminary result)   Collection Time: 06/25/21  4:18 PM   Specimen: BLOOD  Result Value Ref Range Status   Specimen Description BLOOD BLOOD LEFT FOREARM  Final   Special Requests   Final    BOTTLES DRAWN AEROBIC AND ANAEROBIC Blood Culture results may not be optimal due to an inadequate volume of blood received in culture bottles   Culture   Final    NO GROWTH < 24 HOURS Performed at [facility], [place] [address] [place], [place] [address]    Report Status PENDING  Incomplete      Radiology Studies: CT Angio Chest Pulmonary Embolism (PE) W or WO Contrast  Result Date: 06/25/2021 CLINICAL DATA:  Shortness of breath with nausea and vomiting EXAM: CT ANGIOGRAPHY CHEST WITH CONTRAST TECHNIQUE: Multidetector CT imaging of the chest was performed using the standard protocol during bolus administration of intravenous contrast. Multiplanar CT image reconstructions and MIPs were obtained to evaluate the vascular anatomy. CONTRAST:  75 cc OMNIPAQUE IOHEXOL 350 MG/ML SOLN COMPARISON:  Chest radiograph 06/25/2021 FINDINGS: Cardiovascular: No filling defect is identified in the pulmonary arterial tree to suggest pulmonary embolus. Coronary, aortic arch, and branch vessel atherosclerotic vascular disease. Mild cardiomegaly. Mild posterior pericardial effusion. Mediastinum/Nodes: No  pathologic adenopathy identified. Lungs/Pleura: Dependent airspace opacities in both lower lobes, with frothy filling defect in the left mainstem bronchus filling the left lower lobe bronchus. Bilateral airway thickening is present and there is some patchy peribronchovascular ground-glass opacity in the right upper lobe and a small amount of ground-glass density airspace opacity anteriorly in the apical segment right upper lobe as on image 15 series 4. Appearance raises concern for possible aspiration pneumonitis/multilobar pneumonia. Sub solid 5 mm right middle lobe pulmonary nodule on image 58 series 4. Upper Abdomen: Deferred to dedicated CT abdomen report. Musculoskeletal: Degenerative right sternoclavicular arthropathy. Bilateral degenerative glenohumeral arthropathy, right greater than left. Thoracic spondylosis. Review of the MIP images confirms the above findings. IMPRESSION: 1. No filling defect is identified in the pulmonary arterial tree to suggest pulmonary embolus. 2. Dependent airspace opacities in both lower lobes with frothy filling defect in the left mainstem bronchus and filling the left lower lobe bronchus. Appearance suggest aspiration pneumonitis or multilobar pneumonia. 3. Bilateral airway thickening is also present with some patchy right upper lobe peribronchovascular ground-glass opacity and a ground-glass density airspace opacity anteriorly in the apical segment right upper lobe. 4. Sub solid 5 mm right middle lobe pulmonary nodule on image 58 series 4. No follow-up recommended. This recommendation follows the consensus statement: Guidelines for Management of Incidental Pulmonary Nodules Detected on CT Images: From the Fleischner Society 2017; Radiology 2017; 284:228-243.  5. Mild cardiomegaly small posterior pericardial effusion. 6.  Aortic Atherosclerosis (ICD10-I70.0).  Coronary atherosclerosis. Electronically Signed   By: Van Clines M.D.   On: 06/25/2021 16:33   CT ABDOMEN  PELVIS W CONTRAST  Result Date: 06/25/2021 CLINICAL DATA:  Acute abdominal pain. Shortness of breath with nausea vomiting. EXAM: CT ABDOMEN AND PELVIS WITH CONTRAST TECHNIQUE: Multidetector CT imaging of the abdomen and pelvis was performed using the standard protocol following bolus administration of intravenous contrast. CONTRAST:  168m OMNIPAQUE IOHEXOL 350 MG/ML SOLN COMPARISON:  09/22/2011 FINDINGS: Lower chest: Bibasilar airspace opacities as shown on the CT chest, cannot exclude aspiration pneumonitis or multilobar pneumonia. Mild cardiomegaly. Atherosclerotic disease of the descending thoracic aorta. Hepatobiliary: Nonspecific 4 mm hypodense lesion of the right hepatic lobe inferiorly on image 33 series 6. Gallbladder unremarkable. Pancreas: Unremarkable Spleen: Unremarkable Adrenals/Urinary Tract: A 1.0 by 0.7 cm hypodense lesion in the left kidney lower pole is probably a cyst on image 25 of series 11, slightly larger than it was in 2012, but technically nonspecific due to small size. No hydronephrosis or hydroureter. Adrenal glands normal. Stomach/Bowel: Large amount of stool in the rectum with mild wall thickening in the rectum, cannot exclude stercoral colitis. The sigmoid colon deviate surround an 8.0 by 5.6 by 11.0 cm calcified mass in the pelvis which is most likely a subserosal uterine fibroid along the dorsum of the uterus. There is mild prominence of stool in the transverse and descending colon as well. Vascular/Lymphatic: Atherosclerosis is present, including aortoiliac atherosclerotic disease. Reproductive: Large calcified mass along the dorsum of the uterus, likely a fibroid. Small uterine fibroids noted. Other: No supplemental non-categorized findings. Musculoskeletal: Bridging spurring of the sacroiliac joints. Lumbar spondylosis. Broad umbilical hernia contains a loop of small bowel without strangulation or obstruction. Spurring at the right hamstring tendon origination site. Spurring in  the hips. IMPRESSION: 1. Prominent rectal stool stool suggesting fecal impaction. Wall thickening in the right dome could reflect stercoral colitis. No gas in the bowel wall currently. There is also some mild prominence of stool in the rest of the distal colon. 2. 11 cm in long axis calcified pelvic mass just above the uterus compatible with a large calcified uterine fibroid. 3. Bibasilar airspace opacities, please see CT chest for further characterization. 4.  Aortic Atherosclerosis (ICD10-I70.0). 5. Broad umbilical hernia with a loop of small bowel without strangulation or obstruction. Electronically Signed   By: WVan ClinesM.D.   On: 06/25/2021 16:44   DG Chest Portable 1 View  Result Date: 06/25/2021 CLINICAL DATA:  Shortness of breath and respiratory distress. EXAM: PORTABLE CHEST 1 VIEW COMPARISON:  09/28/2011 FINDINGS: 0840 hours. The cardio pericardial silhouette is enlarged. There is pulmonary vascular congestion possible interstitial edema. No consolidation. No effusion.Telemetry leads overlie the chest. IMPRESSION: Enlarged cardiopericardial silhouette with pulmonary vascular congestion and possible interstitial edema. Electronically Signed   By: EMisty StanleyM.D.   On: 06/25/2021 08:58   ECHOCARDIOGRAM COMPLETE  Result Date: 06/25/2021    ECHOCARDIOGRAM REPORT   Patient Name:   Meagan KellDate of Exam: 06/25/2021 Medical Rec #:  [id number]        Height:       65.0 in Accession #:    [id number]       Weight:       190.0 lb Date of Birth:  [birth year]       BSA:          1.936 m Patient Age:  74 years          BP:           116/67 mmHg Patient Gender: F                 HR:           131 bpm. Exam Location:  Inpatient Procedure: 2D Echo, Cardiac Doppler, Color Doppler and Intracardiac            Opacification Agent                           STAT ECHO Reported to: Dr Rudean Haskell on 06/25/2021 1:31:00 PM.                                 MODIFIED REPORT:     This report was  modified by Rudean Haskell MD on 06/25/2021 due to                                 [facility] team.  Indications:     Elevated troponin  History:         Patient has no prior history of Echocardiogram examinations.                  Signs/Symptoms:Dyspnea; Risk Factors:Dyslipidemia and                  Hypertension. COIVD-19. Multiple sclerois. Hx DVT.  Sonographer:     Clayton Lefort RDCS (AE) Referring Phys:  [id number] [facility] Diagnosing Phys: Rudean Haskell MD IMPRESSIONS  1. Left ventricular ejection fraction, by estimation, is 20 to 25%. The left ventricle has severely decreased function. The left ventricle demonstrates regional wall motion abnormalities (significant LV dysfunction with basal sparing). There is severe asymmetric left ventricular hypertrophy. Left ventricular diastolic parameters are indeterminate.  2. Right ventricular systolic function is normal. The right ventricular size is normal.  3. Moderate pleural effusion.  4. The mitral valve is normal in structure. Mild mitral valve regurgitation.  5. The aortic valve is tricuspid. There is mild thickening of the aortic valve. Aortic valve regurgitation is mild. Comparison(s): No prior Echocardiogram. Conclusion(s)/Recommendation(s): Cardiology has been consulted; they have been made aware. FINDINGS  Left Ventricle: Left ventricular ejection fraction, by estimation, is 20 to 25%. The left ventricle has severely decreased function. The left ventricle demonstrates regional wall motion abnormalities. Definity contrast agent was given IV to delineate the left ventricular endocardial borders. The left ventricular internal cavity size was normal in size. There is severe asymmetric left ventricular hypertrophy. Left ventricular diastolic parameters are indeterminate.  LV Wall Scoring: The apical septal segment, apical inferior segment, and apex are dyskinetic. The mid and distal anterior wall, mid and distal lateral wall, mid anteroseptal  segment, mid anterolateral segment, mid inferoseptal segment, and mid inferior segment are hypokinetic. Right Ventricle: The right ventricular size is normal. No increase in right ventricular wall thickness. Right ventricular systolic function is normal. Left Atrium: Left atrial size was normal in size. Right Atrium: Right atrial size was normal in size. Pericardium: Trivial pericardial effusion is present. Mitral Valve: The mitral valve is normal in structure. Mild mitral valve regurgitation. Tricuspid Valve: The tricuspid valve is normal in structure. Tricuspid valve regurgitation is mild. Aortic Valve: The aortic valve is tricuspid. There is mild thickening of  the aortic valve. Aortic valve regurgitation is mild. Aortic regurgitation PHT measures 445 msec. Aortic valve mean gradient measures 2.0 mmHg. Aortic valve peak gradient measures 4.6 mmHg. Aortic valve area, by VTI measures 3.17 cm. Pulmonic Valve: The pulmonic valve was not well visualized. Pulmonic valve regurgitation is not visualized. No evidence of pulmonic stenosis. Aorta: The aortic root and ascending aorta are structurally normal, with no evidence of dilitation. IAS/Shunts: The interatrial septum was not well visualized. Additional Comments: There is a moderate pleural effusion.  LEFT VENTRICLE PLAX 2D LVIDd:         4.00 cm LVIDs:         2.70 cm LV PW:         1.70 cm LV IVS:        1.50 cm LVOT diam:     2.00 cm LV SV:         42 LV SV Index:   22 LVOT Area:     3.14 cm  RIGHT VENTRICLE RV Basal diam:  2.70 cm RV S prime:     19.70 cm/s LEFT ATRIUM           Index       RIGHT ATRIUM           Index LA diam:      2.50 cm 1.29 cm/m  RA Area:     11.80 cm LA Vol (A2C): 45.0 ml 23.25 ml/m RA Volume:   26.80 ml  13.85 ml/m LA Vol (A4C): 41.6 ml 21.49 ml/m  AORTIC VALVE AV Area (Vmax):    2.58 cm AV Area (Vmean):   2.80 cm AV Area (VTI):     3.17 cm AV Vmax:           107.00 cm/s AV Vmean:          70.500 cm/s AV VTI:            0.133 m AV  Peak Grad:      4.6 mmHg AV Mean Grad:      2.0 mmHg LVOT Vmax:         87.80 cm/s LVOT Vmean:        62.800 cm/s LVOT VTI:          0.134 m LVOT/AV VTI ratio: 1.01 AI PHT:            445 msec  AORTA Ao Root diam: 3.00 cm Ao Asc diam:  3.60 cm TRICUSPID VALVE TR Peak grad:   25.8 mmHg TR Vmax:        254.00 cm/s  SHUNTS Systemic VTI:  0.13 m Systemic Diam: 2.00 cm Rudean Haskell MD Electronically signed by Rudean Haskell MD Signature Date/Time: 06/25/2021/2:01:30 PM    Final (Updated)     Scheduled Meds:  albuterol  2 puff Inhalation Q6H   vitamin C  500 mg Oral Daily   aspirin  81 mg Oral Once   atorvastatin  40 mg Oral QHS   baclofen  10 mg Oral QID   buPROPion  300 mg Oral Daily   carbamazepine  200 mg Oral TID   carvedilol  3.125 mg Oral BID WC   dexamethasone  6 mg Oral Daily   famotidine (PEPCID) IV  20 mg Intravenous Q12H   lamoTRIgine  25 mg Oral QHS   lisinopril  2.5 mg Oral Daily   mouth rinse  15 mL Mouth Rinse BID   pregabalin  100 mg Oral BID   sertraline  75 mg Oral  Daily   sodium chloride flush  3 mL Intravenous Q12H   zinc sulfate  220 mg Oral Daily   Continuous Infusions:  cefTRIAXone (ROCEPHIN)  IV Stopped (06/25/21 2146)   doxycycline (VIBRAMYCIN) IV 100 mg (06/26/21 1110)   heparin 900 Units/hr (06/26/21 1325)   potassium chloride 10 mEq (06/26/21 1314)   potassium PHOSPHATE IVPB (in mmol) 15 mmol (06/26/21 1320)   remdesivir 100 mg in NS 100 mL 100 mg (06/26/21 0928)     LOS: 1 day   Time spent: 37 minutes   Darliss Cheney, MD [facility]  06/26/2021, 2:13 PM  Please page via [place] and do not message via secure chat for anything urgent. Secure chat can be used for anything non urgent.  How to contact the [facility] Attending or Consulting provider [place] or covering provider during after hours [place], for this patient?  Check the care team in [facility] and look for a) attending/consulting [facility] provider listed and b) the [facility] team listed. Page or secure  chat [phone]. Log into [email or web address] and use [place] universal password to access. If you do not have the password, please contact the hospital operator. Locate the [facility] provider you are looking for under [facility] and page to a number that you can be directly reached. If you still have difficulty reaching the provider, please page the [facility] (Director on Call) for the Hospitalists listed on amion for assistance.

## 2021-06-26 NOTE — Progress Notes (Signed)
Notified MD of critical potassium 2.7. Awaiting order/response.

## 2021-06-26 NOTE — Progress Notes (Signed)
Notified MD that patient is NPO and cannot tolerate PO potassium. Awaiting change of medication route.

## 2021-06-26 NOTE — Progress Notes (Signed)
Sacral foam removed from sacrum due to increased frequency of bowel movements following smog enema. A&D ointment applied to affected pressure injury site. Cleaned. Will continue to maintain skin integrity and cleanse area following bowel and urinary incontinence.

## 2021-06-26 NOTE — Progress Notes (Signed)
Cardiology Progress Note  Patient ID: [name] MRN: [id number] DOB: 11-06-1945 Date of Encounter: 06/26/2021  Primary Cardiologist: None  Subjective   Chief Complaint: Confusion  HPI: Reports she feels like she is going to fall out of the bed.  Quite confused.  Awake and alert but only oriented to self.  ROS:  All other ROS reviewed and negative. Pertinent positives noted in the HPI.     Inpatient Medications  Scheduled Meds:  albuterol  2 puff Inhalation Q6H   vitamin C  500 mg Oral Daily   aspirin  81 mg Oral Once   atorvastatin  40 mg Oral QHS   baclofen  10 mg Oral QID   buPROPion  300 mg Oral Daily   carbamazepine  200 mg Oral TID   carvedilol  3.125 mg Oral BID WC   famotidine (PEPCID) IV  20 mg Intravenous Q12H   lamoTRIgine  25 mg Oral QHS   mouth rinse  15 mL Mouth Rinse BID   potassium chloride  40 mEq Oral Q1H   pregabalin  100 mg Oral BID   sertraline  75 mg Oral Daily   sodium chloride flush  3 mL Intravenous Q12H   zinc sulfate  220 mg Oral Daily   Continuous Infusions:  cefTRIAXone (ROCEPHIN)  IV Stopped (06/25/21 2146)   doxycycline (VIBRAMYCIN) IV Stopped (06/26/21 0016)   heparin 900 Units/hr (06/26/21 0700)   potassium chloride     potassium PHOSPHATE IVPB (in mmol)     remdesivir 100 mg in NS 100 mL     PRN Meds: acetaminophen, chlorpheniramine-HYDROcodone, guaiFENesin-dextromethorphan, morphine injection, ondansetron **OR** ondansetron (ZOFRAN) IV, vitamin A & D, white petrolatum   Vital Signs   Vitals:   06/26/21 0500 06/26/21 0600 06/26/21 0700 06/26/21 0807  BP: 129/71 121/68 133/79 129/71  Pulse: (!) 109 (!) 106 (!) 108 (!) 103  Resp: (!) 35 (!) 30 (!) 33 (!) 27  Temp: 98.1 F (36.7 C)   98.3 F (36.8 C)  TempSrc: Oral   Oral  SpO2: 99% 95% 100% 96%  Weight: 69 kg     Height:        Intake/Output Summary (Last 24 hours) at 06/26/2021 0903 Last data filed at 06/26/2021 0700 Gross per 24 hour  Intake 1687.87 ml  Output  500 ml  Net 1187.87 ml   Last 3 Weights 06/26/2021 06/25/2021 06/25/2021  Weight (lbs) 152 lb 1.9 oz 153 lb 190 lb 0.6 oz  Weight (kg) 69 kg 69.4 kg 86.2 kg      Telemetry  Overnight telemetry shows sinus tachycardia low 100s, which I personally reviewed.   ECG  The most recent ECG shows sinus tachycardia heart rate 105, anterolateral T wave inversions, which I personally reviewed.   Physical Exam   Vitals:   06/26/21 0500 06/26/21 0600 06/26/21 0700 06/26/21 0807  BP: 129/71 121/68 133/79 129/71  Pulse: (!) 109 (!) 106 (!) 108 (!) 103  Resp: (!) 35 (!) 30 (!) 33 (!) 27  Temp: 98.1 F (36.7 C)   98.3 F (36.8 C)  TempSrc: Oral   Oral  SpO2: 99% 95% 100% 96%  Weight: 69 kg     Height:        Intake/Output Summary (Last 24 hours) at 06/26/2021 0903 Last data filed at 06/26/2021 0700 Gross per 24 hour  Intake 1687.87 ml  Output 500 ml  Net 1187.87 ml    Last 3 Weights 06/26/2021 06/25/2021 06/25/2021  Weight (lbs) 152 lb  1.9 oz 153 lb 190 lb 0.6 oz  Weight (kg) 69 kg 69.4 kg 86.2 kg    Body mass index is 26.11 kg/m.   General: Well nourished, well developed, in no acute distress Head: Atraumatic, normal size  Eyes: PEERLA, EOMI  Neck: Supple, no JVD Endocrine: No thryomegaly Cardiac: Normal S1, S2; tachycardia, no murmurs Lungs: Diminished breath sounds bilaterally Abd: Soft, nontender, no hepatomegaly  Ext: R BKA Skin: Warm and dry, no rashes   Neuro: Alert, awake, oriented to self only  Labs  High Sensitivity Troponin:   Recent Labs  Lab 06/25/21 0821 06/25/21 1034 06/25/21 1618  TROPONINIHS 398* 1,150* 2,170*     Cardiac EnzymesNo results for input(s): TROPONINI in the last 168 hours. No results for input(s): TROPIPOC in the last 168 hours.  Chemistry Recent Labs  Lab 06/25/21 0821 06/25/21 0914 06/25/21 1618 06/26/21 0437  NA 136 137  --  139  K 3.6 3.4*  --  2.7*  CL 100  --   --  103  CO2 23  --   --  22  GLUCOSE 149*  --   --  133*  BUN 8  --    --  10  CREATININE 0.54  --   --  0.54  CALCIUM 9.3  --   --  8.6*  PROT  --   --  6.2* 6.2*  ALBUMIN  --   --  3.0* 2.9*  AST  --   --  31 35  ALT  --   --  18 18  ALKPHOS  --   --  89 86  BILITOT  --   --  0.8 0.7  GFRNONAA >60  --   --  >60  ANIONGAP 13  --   --  14    Hematology Recent Labs  Lab 06/25/21 0821 06/25/21 0914 06/26/21 0437  WBC 11.0*  --  9.5  RBC 4.07  --  3.90  HGB 12.8 12.6 12.3  HCT 38.6 37.0 35.5*  MCV 94.8  --  91.0  MCH 31.4  --  31.5  MCHC 33.2  --  34.6  RDW 13.6  --  13.8  PLT 170  --  157   BNP Recent Labs  Lab 06/25/21 0821  BNP 34.2    DDimer  Recent Labs  Lab 06/25/21 1153 06/26/21 0437  DDIMER 1.15* 1.18*     Radiology  CT Angio Chest Pulmonary Embolism (PE) W or WO Contrast  Result Date: 06/25/2021 CLINICAL DATA:  Shortness of breath with nausea and vomiting EXAM: CT ANGIOGRAPHY CHEST WITH CONTRAST TECHNIQUE: Multidetector CT imaging of the chest was performed using the standard protocol during bolus administration of intravenous contrast. Multiplanar CT image reconstructions and MIPs were obtained to evaluate the vascular anatomy. CONTRAST:  75 cc OMNIPAQUE IOHEXOL 350 MG/ML SOLN COMPARISON:  Chest radiograph 06/25/2021 FINDINGS: Cardiovascular: No filling defect is identified in the pulmonary arterial tree to suggest pulmonary embolus. Coronary, aortic arch, and branch vessel atherosclerotic vascular disease. Mild cardiomegaly. Mild posterior pericardial effusion. Mediastinum/Nodes: No pathologic adenopathy identified. Lungs/Pleura: Dependent airspace opacities in both lower lobes, with frothy filling defect in the left mainstem bronchus filling the left lower lobe bronchus. Bilateral airway thickening is present and there is some patchy peribronchovascular ground-glass opacity in the right upper lobe and a small amount of ground-glass density airspace opacity anteriorly in the apical segment right upper lobe as on image 15 series 4.  Appearance raises concern for possible aspiration pneumonitis/multilobar  pneumonia. Sub solid 5 mm right middle lobe pulmonary nodule on image 58 series 4. Upper Abdomen: Deferred to dedicated CT abdomen report. Musculoskeletal: Degenerative right sternoclavicular arthropathy. Bilateral degenerative glenohumeral arthropathy, right greater than left. Thoracic spondylosis. Review of the MIP images confirms the above findings. IMPRESSION: 1. No filling defect is identified in the pulmonary arterial tree to suggest pulmonary embolus. 2. Dependent airspace opacities in both lower lobes with frothy filling defect in the left mainstem bronchus and filling the left lower lobe bronchus. Appearance suggest aspiration pneumonitis or multilobar pneumonia. 3. Bilateral airway thickening is also present with some patchy right upper lobe peribronchovascular ground-glass opacity and a ground-glass density airspace opacity anteriorly in the apical segment right upper lobe. 4. Sub solid 5 mm right middle lobe pulmonary nodule on image 58 series 4. No follow-up recommended. This recommendation follows the consensus statement: Guidelines for Management of Incidental Pulmonary Nodules Detected on CT Images: From the Fleischner Society 2017; Radiology 2017; 284:228-243. 5. Mild cardiomegaly small posterior pericardial effusion. 6.  Aortic Atherosclerosis (ICD10-I70.0).  Coronary atherosclerosis. Electronically Signed   By: Van Clines M.D.   On: 06/25/2021 16:33   CT ABDOMEN PELVIS W CONTRAST  Result Date: 06/25/2021 CLINICAL DATA:  Acute abdominal pain. Shortness of breath with nausea vomiting. EXAM: CT ABDOMEN AND PELVIS WITH CONTRAST TECHNIQUE: Multidetector CT imaging of the abdomen and pelvis was performed using the standard protocol following bolus administration of intravenous contrast. CONTRAST:  113m OMNIPAQUE IOHEXOL 350 MG/ML SOLN COMPARISON:  09/22/2011 FINDINGS: Lower chest: Bibasilar airspace opacities as shown  on the CT chest, cannot exclude aspiration pneumonitis or multilobar pneumonia. Mild cardiomegaly. Atherosclerotic disease of the descending thoracic aorta. Hepatobiliary: Nonspecific 4 mm hypodense lesion of the right hepatic lobe inferiorly on image 33 series 6. Gallbladder unremarkable. Pancreas: Unremarkable Spleen: Unremarkable Adrenals/Urinary Tract: A 1.0 by 0.7 cm hypodense lesion in the left kidney lower pole is probably a cyst on image 25 of series 11, slightly larger than it was in 2012, but technically nonspecific due to small size. No hydronephrosis or hydroureter. Adrenal glands normal. Stomach/Bowel: Large amount of stool in the rectum with mild wall thickening in the rectum, cannot exclude stercoral colitis. The sigmoid colon deviate surround an 8.0 by 5.6 by 11.0 cm calcified mass in the pelvis which is most likely a subserosal uterine fibroid along the dorsum of the uterus. There is mild prominence of stool in the transverse and descending colon as well. Vascular/Lymphatic: Atherosclerosis is present, including aortoiliac atherosclerotic disease. Reproductive: Large calcified mass along the dorsum of the uterus, likely a fibroid. Small uterine fibroids noted. Other: No supplemental non-categorized findings. Musculoskeletal: Bridging spurring of the sacroiliac joints. Lumbar spondylosis. Broad umbilical hernia contains a loop of small bowel without strangulation or obstruction. Spurring at the right hamstring tendon origination site. Spurring in the hips. IMPRESSION: 1. Prominent rectal stool stool suggesting fecal impaction. Wall thickening in the right dome could reflect stercoral colitis. No gas in the bowel wall currently. There is also some mild prominence of stool in the rest of the distal colon. 2. 11 cm in long axis calcified pelvic mass just above the uterus compatible with a large calcified uterine fibroid. 3. Bibasilar airspace opacities, please see CT chest for further characterization.  4.  Aortic Atherosclerosis (ICD10-I70.0). 5. Broad umbilical hernia with a loop of small bowel without strangulation or obstruction. Electronically Signed   By: WVan ClinesM.D.   On: 06/25/2021 16:44   DG Chest Portable 1 View  Result Date:  06/25/2021 CLINICAL DATA:  Shortness of breath and respiratory distress. EXAM: PORTABLE CHEST 1 VIEW COMPARISON:  09/28/2011 FINDINGS: 0840 hours. The cardio pericardial silhouette is enlarged. There is pulmonary vascular congestion possible interstitial edema. No consolidation. No effusion.Telemetry leads overlie the chest. IMPRESSION: Enlarged cardiopericardial silhouette with pulmonary vascular congestion and possible interstitial edema. Electronically Signed   By: Misty Stanley M.D.   On: 06/25/2021 08:58   ECHOCARDIOGRAM COMPLETE  Result Date: 06/25/2021    ECHOCARDIOGRAM REPORT   Patient Name:   [name] Date of Exam: 06/25/2021 Medical Rec #:  [id number]         Height:       65.0 in Accession #:    [id number]        Weight:       190.0 lb Date of Birth:  1946/04/29        BSA:          1.936 m Patient Age:    4 years          BP:           116/67 mmHg Patient Gender: F                 HR:           131 bpm. Exam Location:  Inpatient Procedure: 2D Echo, Cardiac Doppler, Color Doppler and Intracardiac            Opacification Agent                           STAT ECHO Reported to: Dr Rudean Haskell on 06/25/2021 1:31:00 PM.                                 MODIFIED REPORT:     This report was modified by Rudean Haskell MD on 06/25/2021 due to                                 [facility] team.  Indications:     Elevated troponin  History:         Patient has no prior history of Echocardiogram examinations.                  Signs/Symptoms:Dyspnea; Risk Factors:Dyslipidemia and                  Hypertension. COIVD-19. Multiple sclerois. Hx DVT.  Sonographer:     Clayton Lefort RDCS (AE) Referring Phys:  [id number] [facility] SMITH Diagnosing Phys: Rudean Haskell MD IMPRESSIONS  1. Left ventricular ejection fraction, by estimation, is 20 to 25%. The left ventricle has severely decreased function. The left ventricle demonstrates regional wall motion abnormalities (significant LV dysfunction with basal sparing). There is severe asymmetric left ventricular hypertrophy. Left ventricular diastolic parameters are indeterminate.  2. Right ventricular systolic function is normal. The right ventricular size is normal.  3. Moderate pleural effusion.  4. The mitral valve is normal in structure. Mild mitral valve regurgitation.  5. The aortic valve is tricuspid. There is mild thickening of the aortic valve. Aortic valve regurgitation is mild. Comparison(s): No prior Echocardiogram. Conclusion(s)/Recommendation(s): Cardiology has been consulted; they have been made aware. FINDINGS  Left Ventricle: Left ventricular ejection fraction, by estimation, is 20 to 25%. The left ventricle has severely decreased function. The left  ventricle demonstrates regional wall motion abnormalities. Definity contrast agent was given IV to delineate the left ventricular endocardial borders. The left ventricular internal cavity size was normal in size. There is severe asymmetric left ventricular hypertrophy. Left ventricular diastolic parameters are indeterminate.  LV Wall Scoring: The apical septal segment, apical inferior segment, and apex are dyskinetic. The mid and distal anterior wall, mid and distal lateral wall, mid anteroseptal segment, mid anterolateral segment, mid inferoseptal segment, and mid inferior segment are hypokinetic. Right Ventricle: The right ventricular size is normal. No increase in right ventricular wall thickness. Right ventricular systolic function is normal. Left Atrium: Left atrial size was normal in size. Right Atrium: Right atrial size was normal in size. Pericardium: Trivial pericardial effusion is present. Mitral Valve: The mitral valve is normal in structure.  Mild mitral valve regurgitation. Tricuspid Valve: The tricuspid valve is normal in structure. Tricuspid valve regurgitation is mild. Aortic Valve: The aortic valve is tricuspid. There is mild thickening of the aortic valve. Aortic valve regurgitation is mild. Aortic regurgitation PHT measures 445 msec. Aortic valve mean gradient measures 2.0 mmHg. Aortic valve peak gradient measures 4.6 mmHg. Aortic valve area, by VTI measures 3.17 cm. Pulmonic Valve: The pulmonic valve was not well visualized. Pulmonic valve regurgitation is not visualized. No evidence of pulmonic stenosis. Aorta: The aortic root and ascending aorta are structurally normal, with no evidence of dilitation. IAS/Shunts: The interatrial septum was not well visualized. Additional Comments: There is a moderate pleural effusion.  LEFT VENTRICLE PLAX 2D LVIDd:         4.00 cm LVIDs:         2.70 cm LV PW:         1.70 cm LV IVS:        1.50 cm LVOT diam:     2.00 cm LV SV:         42 LV SV Index:   22 LVOT Area:     3.14 cm  RIGHT VENTRICLE RV Basal diam:  2.70 cm RV S prime:     19.70 cm/s LEFT ATRIUM           Index       RIGHT ATRIUM           Index LA diam:      2.50 cm 1.29 cm/m  RA Area:     11.80 cm LA Vol (A2C): 45.0 ml 23.25 ml/m RA Volume:   26.80 ml  13.85 ml/m LA Vol (A4C): 41.6 ml 21.49 ml/m  AORTIC VALVE AV Area (Vmax):    2.58 cm AV Area (Vmean):   2.80 cm AV Area (VTI):     3.17 cm AV Vmax:           107.00 cm/s AV Vmean:          70.500 cm/s AV VTI:            0.133 m AV Peak Grad:      4.6 mmHg AV Mean Grad:      2.0 mmHg LVOT Vmax:         87.80 cm/s LVOT Vmean:        62.800 cm/s LVOT VTI:          0.134 m LVOT/AV VTI ratio: 1.01 AI PHT:            445 msec  AORTA Ao Root diam: 3.00 cm Ao Asc diam:  3.60 cm TRICUSPID VALVE TR Peak grad:   25.8 mmHg TR Vmax:  254.00 cm/s  SHUNTS Systemic VTI:  0.13 m Systemic Diam: 2.00 cm Rudean Haskell MD Electronically signed by Rudean Haskell MD Signature Date/Time:  06/25/2021/2:01:30 PM    Final (Updated)     Cardiac Studies  TTE 06/15/2021  1. Left ventricular ejection fraction, by estimation, is 20 to 25%. The  left ventricle has severely decreased function. The left ventricle  demonstrates regional wall motion abnormalities (significant LV  dysfunction with basal sparing). There is severe  asymmetric left ventricular hypertrophy. Left ventricular diastolic  parameters are indeterminate.   2. Right ventricular systolic function is normal. The right ventricular  size is normal.   3. Moderate pleural effusion.   4. The mitral valve is normal in structure. Mild mitral valve  regurgitation.   5. The aortic valve is tricuspid. There is mild thickening of the aortic  valve. Aortic valve regurgitation is mild.   Patient Profile  SILAS MICHIELS is a 75 y.o. 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  Cardiology was consulted for elevated troponin and abnormal echocardiogram.  Assessment & Plan   #Acute hypoxic respiratory failure -Admitted with shortness of breath.  CT scan shows no PE.  She has what appears to be aspiration pneumonitis or Multi lobar pneumonia.  She does not appear volume overloaded.  BNP is normal. -I really do not think her picture is explained by congestive heart failure.  See discussion below. -For now we will hold diuresis.  Would treat her pneumonia as you are doing.  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  This shows severe LV dysfunction with basically akinesis of the entire apical segments.  This either represents large LAD wraparound infarction versus stress-induced cardiomyopathy. -Given minimal troponin elevation and no  evidence of ST elevation and I suspect this is just a stress-induced cardiomyopathy. -Regardless if this was an acute coronary syndrome she is not a candidate for invasive angiography.  She is bedbound with severe dementia.  She is here with pneumonia.  This was discussed with the family yesterday. -For now we will just complete 48 hours of heparin and can stop. -Continue aspirin and statin. -Coreg 3.125 mg twice daily was added.  I will add lisinopril 2.5 mg daily. -I would not recommend any further management for this.  Would recommend to take Lasix as needed for any swelling.  Currently she appears euvolemic.  All that can be offered is medical management from a cardiovascular standpoint. -She likely needs aggressive goals of care discussion.  Would recommend palliative care discussion.  [facility] will sign off.   Medication Recommendations: Medical management as above Other recommendations (labs, testing, etc): None Follow up as an outpatient: Please notify us when she is closer to discharge.  We can arrange outpatient follow-up.  For now would recommend medical management as above.  For questions or updates, please contact [place] Please consult [email or web address] for contact info under   Time Spent with Patient: I have spent a total of 35 minutes with patient reviewing hospital notes, telemetry, EKGs, labs and examining the patient as well as establishing an assessment and plan that was discussed with the patient.  > 50% of time was spent in direct patient care.    Signed, Addison Naegeli. Audie Box, MD, [place]  06/26/2021 9:03 AM

## 2021-06-27 DIAGNOSIS — J9601 Acute respiratory failure with hypoxia: Secondary | ICD-10-CM | POA: Diagnosis not present

## 2021-06-27 LAB — CBC WITH DIFFERENTIAL/PLATELET
Abs Immature Granulocytes: 0.04 10*3/uL (ref 0.00–0.07)
Basophils Absolute: 0 10*3/uL (ref 0.0–0.1)
Basophils Relative: 0 %
Eosinophils Absolute: 0.1 10*3/uL (ref 0.0–0.5)
Eosinophils Relative: 1 %
HCT: 29.8 % — ABNORMAL LOW (ref 36.0–46.0)
Hemoglobin: 10 g/dL — ABNORMAL LOW (ref 12.0–15.0)
Immature Granulocytes: 1 %
Lymphocytes Relative: 24 %
Lymphs Abs: 2 10*3/uL (ref 0.7–4.0)
MCH: 31.2 pg (ref 26.0–34.0)
MCHC: 33.6 g/dL (ref 30.0–36.0)
MCV: 92.8 fL (ref 80.0–100.0)
Monocytes Absolute: 0.7 10*3/uL (ref 0.1–1.0)
Monocytes Relative: 8 %
Neutro Abs: 5.6 10*3/uL (ref 1.7–7.7)
Neutrophils Relative %: 66 %
Platelets: 155 10*3/uL (ref 150–400)
RBC: 3.21 MIL/uL — ABNORMAL LOW (ref 3.87–5.11)
RDW: 14 % (ref 11.5–15.5)
WBC: 8.4 10*3/uL (ref 4.0–10.5)
nRBC: 0 % (ref 0.0–0.2)

## 2021-06-27 LAB — D-DIMER, QUANTITATIVE: D-Dimer, Quant: 0.77 ug/mL-FEU — ABNORMAL HIGH (ref 0.00–0.50)

## 2021-06-27 LAB — COMPREHENSIVE METABOLIC PANEL
ALT: 18 U/L (ref 0–44)
AST: 30 U/L (ref 15–41)
Albumin: 2.6 g/dL — ABNORMAL LOW (ref 3.5–5.0)
Alkaline Phosphatase: 62 U/L (ref 38–126)
Anion gap: 14 (ref 5–15)
BUN: 19 mg/dL (ref 8–23)
CO2: 23 mmol/L (ref 22–32)
Calcium: 7.8 mg/dL — ABNORMAL LOW (ref 8.9–10.3)
Chloride: 101 mmol/L (ref 98–111)
Creatinine, Ser: 0.68 mg/dL (ref 0.44–1.00)
GFR, Estimated: >60 mL/min (ref >60)
Glucose, Bld: 94 mg/dL (ref 70–99)
Potassium: 2.8 mmol/L — ABNORMAL LOW (ref 3.5–5.1)
Sodium: 138 mmol/L (ref 135–145)
Total Bilirubin: 0.5 mg/dL (ref 0.3–1.2)
Total Protein: 5.4 g/dL — ABNORMAL LOW (ref 6.5–8.1)

## 2021-06-27 LAB — HEPARIN LEVEL (UNFRACTIONATED): Heparin Unfractionated: 0.52 IU/mL (ref 0.30–0.70)

## 2021-06-27 LAB — FERRITIN: Ferritin: 165 ng/mL (ref 11–307)

## 2021-06-27 LAB — MAGNESIUM: Magnesium: 1.5 mg/dL — ABNORMAL LOW (ref 1.7–2.4)

## 2021-06-27 LAB — C-REACTIVE PROTEIN: CRP: 16.8 mg/dL — ABNORMAL HIGH (ref <1.0)

## 2021-06-27 LAB — PHOSPHORUS: Phosphorus: 3.4 mg/dL (ref 2.5–4.6)

## 2021-06-27 MED ORDER — POTASSIUM CHLORIDE 10 MEQ/100ML IV SOLN
10.0000 meq | INTRAVENOUS | Status: AC
Start: 2021-06-27 — End: 2021-06-27
  Administered 2021-06-27 (×2): 10 meq via INTRAVENOUS

## 2021-06-27 MED ORDER — MAGNESIUM SULFATE IN D5W 1-5 GM/100ML-% IV SOLN
1.0000 g | Freq: Once | INTRAVENOUS | Status: DC
  Filled 2021-06-27: qty 100

## 2021-06-27 MED ORDER — LACTATED RINGERS IV SOLN: INTRAVENOUS | Status: DC

## 2021-06-27 MED ORDER — POTASSIUM CHLORIDE CRYS ER 20 MEQ PO TBCR: 40.0000 meq | EXTENDED_RELEASE_TABLET | ORAL | Status: DC

## 2021-06-27 MED ORDER — MAGNESIUM SULFATE IN D5W 1-5 GM/100ML-% IV SOLN
1.0000 g | Freq: Once | INTRAVENOUS | Status: AC
  Administered 2021-06-27: 1 g via INTRAVENOUS
  Filled 2021-06-27: qty 100

## 2021-06-27 MED ORDER — POTASSIUM CHLORIDE CRYS ER 20 MEQ PO TBCR
40.0000 meq | EXTENDED_RELEASE_TABLET | ORAL | Status: AC
  Administered 2021-06-27: 40 meq via ORAL
  Filled 2021-06-27: qty 2

## 2021-06-27 MED ORDER — LACTATED RINGERS IV SOLN: INTRAVENOUS | Status: AC

## 2021-06-27 MED ORDER — POTASSIUM CHLORIDE 10 MEQ/100ML IV SOLN
10.0000 meq | INTRAVENOUS | Status: AC
  Administered 2021-06-27 (×3): 10 meq via INTRAVENOUS
  Filled 2021-06-27 (×3): qty 100

## 2021-06-27 NOTE — Progress Notes (Signed)
ANTICOAGULATION CONSULT NOTE - Initial Consult  Pharmacy Consult for heparin Indication: chest pain/ACS  Allergies  Allergen Reactions   Prednisone Other (See Comments)    Caused thrush- will need something to reverse this   Shellfish Allergy Hives        Wound Dressing Adhesive     Splits the skin    Patient Measurements: Height: '5\' 4"'$  (162.6 cm) Weight: 69.9 kg (154 lb 1.6 oz) IBW/kg (Calculated) : 54.7 Heparin Dosing Weight: 75.7 kg  Vital Signs: Temp: 98.7 F (37.1 C) (09/18 0425) Temp Source: Oral (09/18 0425) BP: 110/59 (09/18 0425) Pulse Rate: 77 (09/18 0540)  Labs: Recent Labs    06/25/21 0821 06/25/21 0914 06/25/21 1034 06/25/21 1153 06/25/21 1618 06/25/21 1621 06/25/21 2100 06/26/21 0437 06/27/21 0224  HGB 12.8 12.6  --   --   --   --   --  12.3 10.0*  HCT 38.6 37.0  --   --   --   --   --  35.5* 29.8*  PLT 170  --   --   --   --   --   --  157 155  APTT  --   --   --  >200*  --  182*  --   --   --   LABPROT  --   --   --  16.3*  --  14.7  --   --   --   INR  --   --   --  1.3*  --  1.2  --   --   --   HEPARINUNFRC  --   --   --   --   --   --  0.53 0.65 0.52  CREATININE 0.54  --   --   --   --   --   --  0.54 0.68  TROPONINIHS 398*  --  1,150*  --  2,170*  --   --   --   --      Estimated Creatinine Clearance: 61 mL/min (by C-G formula based on SCr of 0.68 mg/dL).   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 Found to have troponin elevation. Heparin consult for ACS.  No AC PTA. CBC stable.  Heparin level 0.52 (on 900units/hr). No signs or symptoms of bleeding per nursing.   Goal of Therapy Heparin level 0.3-0.7 units/ml Monitor platelets by anticoagulation protocol: Yes   Plan:  Continue heparin drip at 900 units/hr Daily heparin level and CBC ordered   Thank you for allowing pharmacy to participate in this patient's care.  Reatha Harps, PharmD PGY1 Pharmacy Resident 06/27/2021 7:14 AM Check [email or web address] for unit specific pharmacy number

## 2021-06-27 NOTE — Progress Notes (Signed)
Episode PROGRESS NOTE    [name]  PYP:[id number] DOB: [birth year] DOA: 06/25/2021 PCP: Glenis Smoker, MD   Brief Narrative:  HPI: [name] is a 75 y.o. female with medical history significant of MS who is bedbound at baseline, hypertension, dyslipidemia, neurogenic bladder, trigeminal neuralgia, and DVT in 2013 no longer on anticoagulation who presents after being noted to be acutely altered this morning.  History is obtained from her son who is present at bedside.  Patient was noted by her son to be more lethargic this morning with increased work of breathing and foaming at the mouth.  The patient was not responding to him like normal concerned and called EMS.  Normally the patient is alert, but orientation to time and place sometimes waxes and wanes.  Usually she can at least talk in complete phrases/sentences.  The patient's son had recently come down with a productive cough earlier in the week and was productive.  His mother had feeling under the weather and not eating or drinking much for the last 3 days. She currently complains of abdominal pain, but is not able to say much more than.  The patient has been vaccinated for COVID and boosted x2 due to her history of MS. Son notes that she is no longer on anticoagulation, but is not totally clear on why. Upon EMS arrival patient was noted to be hypoxic into the 60s for which she was placed on nonrebreather with improvement in O2 saturations to 99%.   ED Course: Upon admission to the emergency department patient was seen to be afebrile, pulse 125-150, respirations 19-32, blood pressures maintained, and O2 saturations noted as low as 89% initially placed on nonrebreather with improvement in O2 saturations.  After suctioning patient able to be weaned down to 3 L of nasal cannula oxygen.  Labs significant for WBC 11, BNP 34.2, troponin 398, and lactic acid 1.9.  Chest x-ray noted enlarged cardio pericardial silhouette with  pulmonary vascular congestion and possible interstitial edema.  COVID-19 screening was positive.  Case had been discussed with cardiology due to the elevated troponin and it was thought symptoms could likely be secondary to demand and recommended hospitalist admission.  Patient had been given 81 mg of aspirin p.o., 10 mEq of potassium chloride IV, and started on normal saline at 100 mL/h for 1 L.    Assessment & Plan:   Principal Problem:   Acute respiratory failure with hypoxia (HCC) Active Problems:   Multiple sclerosis (HCC)   Hypertension   Trigeminal neuropathy   Abdominal pain   Acute metabolic encephalopathy   COVID-19 virus infection   Prolonged QT interval   SIRS (systemic inflammatory response syndrome) (HCC)   Elevated troponin   Cardiomegaly   Pressure injury of skin  Severe sepsis secondary to acute respiratory failure with hypoxia COVID-19 infection/aspiration pneumonia: Patient met criteria for severe sepsis based on tachycardia, tachypnea and hypoxia with bacterial pneumonia. patient better than yesterday.  Currently only on 2 L oxygen.  COVID-positive.  Also procalcitonin elevated indicates possible bacterial pneumonia.  Inflammatory markers improved somewhat today.  She is now off of oxygen and comfortable.  We will continue remdesivir, Rocephin and doxycycline, bronchodilators and dexamethasone.  Patient was encouraged to prone, out of bed to chair, to use incentive spirometry and flutter valve.  Since patient is improving, no indication of Actemra or baricitinib.   Elevated troponin cardiomegaly: Acute.  High-sensitivity troponin initially 398, but repeat 1150.  Chest x-ray noted enlarged cardiac silhouette.  No prior history reported of heart failure.  Lipid panel within normal range.  Echo shows LVEF of 20 to 25% and regional wall motion abnormalities.  Seen by cardiology.  They opined that she likely has a stress cardiomyopathy and have not recommended any intervention  but recommended medical management.  They recommended continuing heparin through mid day of 06/28/2021 and stop afterwards.  Cardiology signed off.   Acute metabolic/toxic encephalopathy: She was encephalopathy likely secondary to sepsis and infection.  Currently alert and oriented x2-3 and back to her baseline which is verified by the son.  Hypokalemia: Low again.  Will replace.  Hypomagnesemia: We will replace.   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  CT abdomen suggests possible stercoral colitis and fecal impaction.  Constipation resolved.  No abdominal pain.   Essential hypertension: Home blood pressure medications include amlodipine, hydrochlorothiazide 25 mg nightly and losartan 100 mg daily.  All medications but Coreg on hold and blood pressure stable currently however she did have hypotension last night.   Multiple sclerosis: Patient is bedbound due to history of multiple sclerosis.  Followed by Dr. Jannifer Franklin of neurology in the outpatient setting and is on Avonex.    Trigeminal neuralgia -Continue camazepam and Lyrica   Prolonged QT interval: On admission QTC was calculated at 536.  EKG today shows QTC of 486, improved. -Avoid QT prolonging medications for now -Correct any electrolyte abnormalities -Recheck QTC in a.m.   Dementia: At baseline now.  Continue to hold Aricept for now.   Hyperlipidemia: Continue atorvastatin.  DVT prophylaxis:    Code Status: Full Code  Family Communication:  None present at bedside.  Plan of care discussed with son over the phone.  Status is: Inpatient  Remains inpatient appropriate because:IV treatments appropriate due to intensity of illness or inability to take PO  Dispo: The patient is from: Home              Anticipated d/c is to: Home              Patient currently is not medically stable to d/c.   Difficult to place patient No   Estimated body mass index is 26.45 kg/m as calculated from the following:    Height as of this encounter: '5\' 4"'  (1.626 m).   Weight as of this encounter: 69.9 kg.  Pressure Injury 06/25/21 Sacrum Mid Stage 2 -  Partial thickness loss of dermis presenting as a shallow open injury with a red, pink wound bed without slough. Healing;Wound Care at home;dry,flaky,slight bleeding (Active)  06/25/21 2015  Location: Sacrum  Location Orientation: Mid  Staging: Stage 2 -  Partial thickness loss of dermis presenting as a shallow open injury with a red, pink wound bed without slough.  Wound Description (Comments): Healing;Wound Care at home;dry,flaky,slight bleeding  Present on Admission: Yes    Nutritional Assessment: Body mass index is 26.45 kg/m.Marland Kitchen Seen by dietician.  I agree with the assessment and plan as outlined below: Nutrition Status:   Skin Assessment: I have examined the patient's skin and I agree with the wound assessment as performed by the wound care RN as outlined below: Pressure Injury 06/25/21 Sacrum Mid Stage 2 -  Partial thickness loss of dermis presenting as a shallow open injury with a red, pink wound bed without slough. Healing;Wound Care at home;dry,flaky,slight bleeding (Active)  06/25/21 2015  Location: Sacrum  Location Orientation: Mid  Staging: Stage 2 -  Partial thickness loss of dermis presenting as a shallow open injury  with a red, pink wound bed without slough.  Wound Description (Comments): Healing;Wound Care at home;dry,flaky,slight bleeding  Present on Admission: Yes    Consultants:  Cardiology-signed off  Procedures:  None  Antimicrobials:  Anti-infectives (From admission, onward)    Start     Dose/Rate Route Frequency Ordered Stop   06/26/21 1000  remdesivir 100 mg in sodium chloride 0.9 % 100 mL IVPB       See Hyperspace for full Linked Orders Report.   100 mg 200 mL/hr over 30 Minutes Intravenous Daily 06/25/21 1155 06/30/21 0959   06/25/21 2000  cefTRIAXone (ROCEPHIN) 2 g in sodium chloride 0.9 % 100 mL IVPB        2  g 200 mL/hr over 30 Minutes Intravenous Every 24 hours 06/25/21 1857     06/25/21 2000  doxycycline (VIBRAMYCIN) 100 mg in sodium chloride 0.9 % 250 mL IVPB        100 mg 125 mL/hr over 120 Minutes Intravenous Every 12 hours 06/25/21 1902     06/25/21 1200  remdesivir 200 mg in sodium chloride 0.9% 250 mL IVPB       See Hyperspace for full Linked Orders Report.   200 mg 580 mL/hr over 30 Minutes Intravenous Once 06/25/21 1155 06/25/21 1727          Subjective: Patient seen and examined.  She is alert and oriented.  Has no complaints.  Looks comfortable.  Objective: Vitals:   06/26/21 2331 06/27/21 0425 06/27/21 0540 06/27/21 0735  BP: 111/62 (!) 110/59    Pulse: 89 84 77   Resp: '18 15 14   ' Temp: 98.3 F (36.8 C) 98.7 F (37.1 C)    TempSrc: Oral Oral    SpO2: 97% 100% 99% 99%  Weight:  69.9 kg    Height:        Intake/Output Summary (Last 24 hours) at 06/27/2021 1118 Last data filed at 06/27/2021 0500 Gross per 24 hour  Intake 887.59 ml  Output --  Net 887.59 ml    Filed Weights   06/25/21 2006 06/26/21 0500 06/27/21 0425  Weight: 69.4 kg 69 kg 69.9 kg    Examination:  General exam: Appears calm and comfortable  Respiratory system: Rhonchi bilaterally with coarse breath sounds but no wheezes. Respiratory effort normal. Cardiovascular system: S1 & S2 heard, RRR. No JVD, murmurs, rubs, gallops or clicks. No pedal edema. Gastrointestinal system: Abdomen is nondistended, soft and nontender. No organomegaly or masses felt. Normal bowel sounds heard. Central nervous system: Alert and oriented.  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  Data Reviewed: I have personally reviewed following labs and imaging studies  CBC: Recent Labs  Lab 06/25/21 0821 06/25/21 0914 06/26/21 0437 06/27/21 0224  WBC 11.0*  --  9.5 8.4  NEUTROABS 9.0*  --  7.3 5.6  HGB 12.8 12.6 12.3 10.0*  HCT 38.6 37.0 35.5* 29.8*  MCV 94.8  --  91.0 92.8  PLT 170  --  157 659    Basic Metabolic Panel: Recent  Labs  Lab 06/25/21 0821 06/25/21 0914 06/26/21 0437 06/27/21 0224  NA 136 137 139 138  K 3.6 3.4* 2.7* 2.8*  CL 100  --  103 101  CO2 23  --  22 23  GLUCOSE 149*  --  133* 94  BUN 8  --  10 19  CREATININE 0.54  --  0.54 0.68  CALCIUM 9.3  --  8.6* 7.8*  MG  --   --  1.8 1.5*  PHOS  --   --  2.5 3.4    GFR: Estimated Creatinine Clearance: 61 mL/min (by C-G formula based on SCr of 0.68 mg/dL). Liver Function Tests: Recent Labs  Lab 06/25/21 1618 06/26/21 0437 06/27/21 0224  AST 31 35 30  ALT '18 18 18  ' ALKPHOS 89 86 62  BILITOT 0.8 0.7 0.5  PROT 6.2* 6.2* 5.4*  ALBUMIN 3.0* 2.9* 2.6*    No results for input(s): LIPASE, AMYLASE in the last 168 hours. No results for input(s): AMMONIA in the last 168 hours. Coagulation Profile: Recent Labs  Lab 06/25/21 1153 06/25/21 1621  INR 1.3* 1.2    Cardiac Enzymes: No results for input(s): CKTOTAL, CKMB, CKMBINDEX, TROPONINI in the last 168 hours. BNP (last 3 results) No results for input(s): PROBNP in the last 8760 hours. HbA1C: No results for input(s): HGBA1C in the last 72 hours. CBG: No results for input(s): GLUCAP in the last 168 hours. Lipid Profile: Recent Labs    06/26/21 0437  CHOL 175  HDL 45  LDLCALC 107*  TRIG 117  CHOLHDL 3.9    Thyroid Function Tests: Recent Labs    06/25/21 1621  TSH 1.178    Anemia Panel: Recent Labs    06/26/21 0437 06/27/21 0224  FERRITIN 161 165    Sepsis Labs: Recent Labs  Lab 06/25/21 0821 06/25/21 1034 06/25/21 1153  PROCALCITON  --   --  1.95  LATICACIDVEN 1.9 1.9  --      Recent Results (from the past 240 hour(s))  Resp Panel by RT-PCR (Flu A&B, Covid) Nasopharyngeal Swab     Status: Abnormal   Collection Time: 06/25/21  8:20 AM   Specimen: Nasopharyngeal Swab; Nasopharyngeal(NP) swabs in vial transport medium  Result Value Ref Range Status   SARS Coronavirus 2 by RT PCR POSITIVE (A) NEGATIVE Final    Comment: RESULT CALLED TO, READ BACK BY AND  VERIFIED WITH: RN Gaspar Skeeters, [id number] (NOTE) SARS-CoV-2 target nucleic acids are DETECTED.  The SARS-CoV-2 RNA is generally detectable in upper respiratory specimens during the acute phase of infection. Positive results are indicative of the presence of the identified virus, but do not rule out bacterial infection or co-infection with other pathogens not detected by the test. Clinical correlation with patient history and other diagnostic information is necessary to determine patient infection status. The expected result is Negative.  Fact Sheet for Patients: EntrepreneurPulse.com.au  Fact Sheet for Healthcare Providers: IncredibleEmployment.be  This test is not yet approved or cleared by the [place] FDA and  has been authorized for detection and/or diagnosis of SARS-CoV-2 by FDA under an Emergency Use Authorization (EUA).  This EUA will remain in effect (meaning this test can be used)  for the duration of  the COVID-19 declaration under Section 564(b)(1) of the Act, 21 U.S.C. section 360bbb-3(b)(1), unless the authorization is terminated or revoked sooner.     Influenza A by PCR NEGATIVE NEGATIVE Final   Influenza B by PCR NEGATIVE NEGATIVE Final    Comment: (NOTE) The Xpert Xpress SARS-CoV-2/FLU/RSV plus assay is intended as an aid in the diagnosis of influenza from Nasopharyngeal swab specimens and should not be used as a sole basis for treatment. Nasal washings and aspirates are unacceptable for Xpert Xpress SARS-CoV-2/FLU/RSV testing.  Fact Sheet for Patients: EntrepreneurPulse.com.au  Fact Sheet for Healthcare Providers: IncredibleEmployment.be  This test is not yet approved or cleared by the [place] FDA and has been authorized for detection and/or diagnosis of SARS-CoV-2 by FDA under an Emergency Use Authorization (EUA). This EUA will  remain in effect (meaning this test can be  used) for the duration of the COVID-19 declaration under Section 564(b)(1) of the Act, 21 U.S.C. section 360bbb-3(b)(1), unless the authorization is terminated or revoked.  Performed at [facility], [place] [address], [place], [place] [address]   Culture, blood (routine x 2)     Status: None (Preliminary result)   Collection Time: 06/25/21  2:47 PM   Specimen: BLOOD LEFT FOREARM  Result Value Ref Range Status   Specimen Description BLOOD LEFT FOREARM  Final   Special Requests   Final    BOTTLES DRAWN AEROBIC AND ANAEROBIC Blood Culture results may not be optimal due to an inadequate volume of blood received in culture bottles   Culture   Final    NO GROWTH 2 DAYS Performed at [facility], [place] [address], [place], [place] [address]    Report Status PENDING  Incomplete  Culture, blood (routine x 2)     Status: None (Preliminary result)   Collection Time: 06/25/21  4:18 PM   Specimen: BLOOD  Result Value Ref Range Status   Specimen Description BLOOD BLOOD LEFT FOREARM  Final   Special Requests   Final    BOTTLES DRAWN AEROBIC AND ANAEROBIC Blood Culture results may not be optimal due to an inadequate volume of blood received in culture bottles   Culture   Final    NO GROWTH 2 DAYS Performed at [facility], [place] [address], [place], [place] [address]    Report Status PENDING  Incomplete       Radiology Studies: CT Angio Chest Pulmonary Embolism (PE) W or WO Contrast  Result Date: 06/25/2021 CLINICAL DATA:  Shortness of breath with nausea and vomiting EXAM: CT ANGIOGRAPHY CHEST WITH CONTRAST TECHNIQUE: Multidetector CT imaging of the chest was performed using the standard protocol during bolus administration of intravenous contrast. Multiplanar CT image reconstructions and MIPs were obtained to evaluate the vascular anatomy. CONTRAST:  75 cc OMNIPAQUE IOHEXOL 350 MG/ML SOLN COMPARISON:  Chest radiograph 06/25/2021 FINDINGS: Cardiovascular: No filling defect is  identified in the pulmonary arterial tree to suggest pulmonary embolus. Coronary, aortic arch, and branch vessel atherosclerotic vascular disease. Mild cardiomegaly. Mild posterior pericardial effusion. Mediastinum/Nodes: No pathologic adenopathy identified. Lungs/Pleura: Dependent airspace opacities in both lower lobes, with frothy filling defect in the left mainstem bronchus filling the left lower lobe bronchus. Bilateral airway thickening is present and there is some patchy peribronchovascular ground-glass opacity in the right upper lobe and a small amount of ground-glass density airspace opacity anteriorly in the apical segment right upper lobe as on image 15 series 4. Appearance raises concern for possible aspiration pneumonitis/multilobar pneumonia. Sub solid 5 mm right middle lobe pulmonary nodule on image 58 series 4. Upper Abdomen: Deferred to dedicated CT abdomen report. Musculoskeletal: Degenerative right sternoclavicular arthropathy. Bilateral degenerative glenohumeral arthropathy, right greater than left. Thoracic spondylosis. Review of the MIP images confirms the above findings. IMPRESSION: 1. No filling defect is identified in the pulmonary arterial tree to suggest pulmonary embolus. 2. Dependent airspace opacities in both lower lobes with frothy filling defect in the left mainstem bronchus and filling the left lower lobe bronchus. Appearance suggest aspiration pneumonitis or multilobar pneumonia. 3. Bilateral airway thickening is also present with some patchy right upper lobe peribronchovascular ground-glass opacity and a ground-glass density airspace opacity anteriorly in the apical segment right upper lobe. 4. Sub solid 5 mm right middle lobe pulmonary nodule on image 58 series 4. No  follow-up recommended. This recommendation follows the consensus statement: Guidelines for Management of Incidental Pulmonary Nodules Detected on CT Images: From the Fleischner Society 2017; Radiology 2017;  284:228-243. 5. Mild cardiomegaly small posterior pericardial effusion. 6.  Aortic Atherosclerosis (ICD10-I70.0).  Coronary atherosclerosis. Electronically Signed   By: Van Clines M.D.   On: 06/25/2021 16:33   CT ABDOMEN PELVIS W CONTRAST  Result Date: 06/25/2021 CLINICAL DATA:  Acute abdominal pain. Shortness of breath with nausea vomiting. EXAM: CT ABDOMEN AND PELVIS WITH CONTRAST TECHNIQUE: Multidetector CT imaging of the abdomen and pelvis was performed using the standard protocol following bolus administration of intravenous contrast. CONTRAST:  185m OMNIPAQUE IOHEXOL 350 MG/ML SOLN COMPARISON:  09/22/2011 FINDINGS: Lower chest: Bibasilar airspace opacities as shown on the CT chest, cannot exclude aspiration pneumonitis or multilobar pneumonia. Mild cardiomegaly. Atherosclerotic disease of the descending thoracic aorta. Hepatobiliary: Nonspecific 4 mm hypodense lesion of the right hepatic lobe inferiorly on image 33 series 6. Gallbladder unremarkable. Pancreas: Unremarkable Spleen: Unremarkable Adrenals/Urinary Tract: A 1.0 by 0.7 cm hypodense lesion in the left kidney lower pole is probably a cyst on image 25 of series 11, slightly larger than it was in 2012, but technically nonspecific due to small size. No hydronephrosis or hydroureter. Adrenal glands normal. Stomach/Bowel: Large amount of stool in the rectum with mild wall thickening in the rectum, cannot exclude stercoral colitis. The sigmoid colon deviate surround an 8.0 by 5.6 by 11.0 cm calcified mass in the pelvis which is most likely a subserosal uterine fibroid along the dorsum of the uterus. There is mild prominence of stool in the transverse and descending colon as well. Vascular/Lymphatic: Atherosclerosis is present, including aortoiliac atherosclerotic disease. Reproductive: Large calcified mass along the dorsum of the uterus, likely a fibroid. Small uterine fibroids noted. Other: No supplemental non-categorized findings.  Musculoskeletal: Bridging spurring of the sacroiliac joints. Lumbar spondylosis. Broad umbilical hernia contains a loop of small bowel without strangulation or obstruction. Spurring at the right hamstring tendon origination site. Spurring in the hips. IMPRESSION: 1. Prominent rectal stool stool suggesting fecal impaction. Wall thickening in the right dome could reflect stercoral colitis. No gas in the bowel wall currently. There is also some mild prominence of stool in the rest of the distal colon. 2. 11 cm in long axis calcified pelvic mass just above the uterus compatible with a large calcified uterine fibroid. 3. Bibasilar airspace opacities, please see CT chest for further characterization. 4.  Aortic Atherosclerosis (ICD10-I70.0). 5. Broad umbilical hernia with a loop of small bowel without strangulation or obstruction. Electronically Signed   By: WVan ClinesM.D.   On: 06/25/2021 16:44   ECHOCARDIOGRAM COMPLETE  Result Date: 06/25/2021    ECHOCARDIOGRAM REPORT   Patient Name:   DAllen KellDate of Exam: 06/25/2021 Medical Rec #:  [id number]        Height:       65.0 in Accession #:    [id number]       Weight:       190.0 lb Date of Birth:  1December 20, 1947       BSA:          1.936 m Patient Age:    89years          BP:           116/67 mmHg Patient Gender: F                 HR:  131 bpm. Exam Location:  Inpatient Procedure: 2D Echo, Cardiac Doppler, Color Doppler and Intracardiac            Opacification Agent                           STAT ECHO Reported to: Dr Rudean Haskell on 06/25/2021 1:31:00 PM.                                 MODIFIED REPORT:     This report was modified by Rudean Haskell MD on 06/25/2021 due to                                 [facility] team.  Indications:     Elevated troponin  History:         Patient has no prior history of Echocardiogram examinations.                  Signs/Symptoms:Dyspnea; Risk Factors:Dyslipidemia and                   Hypertension. COIVD-19. Multiple sclerois. Hx DVT.  Sonographer:     Clayton Lefort RDCS (AE) Referring Phys:  [id number] [facility] A [facility] Diagnosing Phys: Rudean Haskell MD IMPRESSIONS  1. Left ventricular ejection fraction, by estimation, is 20 to 25%. The left ventricle has severely decreased function. The left ventricle demonstrates regional wall motion abnormalities (significant LV dysfunction with basal sparing). There is severe asymmetric left ventricular hypertrophy. Left ventricular diastolic parameters are indeterminate.  2. Right ventricular systolic function is normal. The right ventricular size is normal.  3. Moderate pleural effusion.  4. The mitral valve is normal in structure. Mild mitral valve regurgitation.  5. The aortic valve is tricuspid. There is mild thickening of the aortic valve. Aortic valve regurgitation is mild. Comparison(s): No prior Echocardiogram. Conclusion(s)/Recommendation(s): Cardiology has been consulted; they have been made aware. FINDINGS  Left Ventricle: Left ventricular ejection fraction, by estimation, is 20 to 25%. The left ventricle has severely decreased function. The left ventricle demonstrates regional wall motion abnormalities. Definity contrast agent was given IV to delineate the left ventricular endocardial borders. The left ventricular internal cavity size was normal in size. There is severe asymmetric left ventricular hypertrophy. Left ventricular diastolic parameters are indeterminate.  LV Wall Scoring: The apical septal segment, apical inferior segment, and apex are dyskinetic. The mid and distal anterior wall, mid and distal lateral wall, mid anteroseptal segment, mid anterolateral segment, mid inferoseptal segment, and mid inferior segment are hypokinetic. Right Ventricle: The right ventricular size is normal. No increase in right ventricular wall thickness. Right ventricular systolic function is normal. Left Atrium: Left atrial size was normal in size. Right  Atrium: Right atrial size was normal in size. Pericardium: Trivial pericardial effusion is present. Mitral Valve: The mitral valve is normal in structure. Mild mitral valve regurgitation. Tricuspid Valve: The tricuspid valve is normal in structure. Tricuspid valve regurgitation is mild. Aortic Valve: The aortic valve is tricuspid. There is mild thickening of the aortic valve. Aortic valve regurgitation is mild. Aortic regurgitation PHT measures 445 msec. Aortic valve mean gradient measures 2.0 mmHg. Aortic valve peak gradient measures 4.6 mmHg. Aortic valve area, by VTI measures 3.17 cm. Pulmonic Valve: The pulmonic valve was not well visualized. Pulmonic valve regurgitation is not visualized. No evidence of  pulmonic stenosis. Aorta: The aortic root and ascending aorta are structurally normal, with no evidence of dilitation. IAS/Shunts: The interatrial septum was not well visualized. Additional Comments: There is a moderate pleural effusion.  LEFT VENTRICLE PLAX 2D LVIDd:         4.00 cm LVIDs:         2.70 cm LV PW:         1.70 cm LV IVS:        1.50 cm LVOT diam:     2.00 cm LV SV:         42 LV SV Index:   22 LVOT Area:     3.14 cm  RIGHT VENTRICLE RV Basal diam:  2.70 cm RV S prime:     19.70 cm/s LEFT ATRIUM           Index       RIGHT ATRIUM           Index LA diam:      2.50 cm 1.29 cm/m  RA Area:     11.80 cm LA Vol (A2C): 45.0 ml 23.25 ml/m RA Volume:   26.80 ml  13.85 ml/m LA Vol (A4C): 41.6 ml 21.49 ml/m  AORTIC VALVE AV Area (Vmax):    2.58 cm AV Area (Vmean):   2.80 cm AV Area (VTI):     3.17 cm AV Vmax:           107.00 cm/s AV Vmean:          70.500 cm/s AV VTI:            0.133 m AV Peak Grad:      4.6 mmHg AV Mean Grad:      2.0 mmHg LVOT Vmax:         87.80 cm/s LVOT Vmean:        62.800 cm/s LVOT VTI:          0.134 m LVOT/AV VTI ratio: 1.01 AI PHT:            445 msec  AORTA Ao Root diam: 3.00 cm Ao Asc diam:  3.60 cm TRICUSPID VALVE TR Peak grad:   25.8 mmHg TR Vmax:        254.00  cm/s  SHUNTS Systemic VTI:  0.13 m Systemic Diam: 2.00 cm Rudean Haskell MD Electronically signed by Rudean Haskell MD Signature Date/Time: 06/25/2021/2:01:30 PM    Final (Updated)     Scheduled Meds:  albuterol  2 puff Inhalation Q6H   vitamin C  500 mg Oral Daily   aspirin  81 mg Oral Once   atorvastatin  40 mg Oral QHS   baclofen  10 mg Oral QID   buPROPion  300 mg Oral Daily   carbamazepine  200 mg Oral TID   carvedilol  3.125 mg Oral BID WC   dexamethasone  6 mg Oral Daily   famotidine (PEPCID) IV  20 mg Intravenous Q12H   lamoTRIgine  25 mg Oral QHS   lisinopril  2.5 mg Oral Daily   mouth rinse  15 mL Mouth Rinse BID   potassium chloride  40 mEq Oral Q4H   pregabalin  100 mg Oral BID   sertraline  75 mg Oral Daily   sodium chloride flush  3 mL Intravenous Q12H   zinc sulfate  220 mg Oral Daily   Continuous Infusions:  cefTRIAXone (ROCEPHIN)  IV 2 g (06/26/21 2324)   doxycycline (VIBRAMYCIN) IV 100 mg (06/27/21 1003)   heparin  900 Units/hr (06/26/21 1325)   lactated ringers 50 mL/hr at 06/27/21 0719   potassium chloride 10 mEq (06/27/21 0723)   remdesivir 100 mg in NS 100 mL 100 mg (06/26/21 0928)     LOS: 2 days   Time spent: 30 minutes   Darliss Cheney, MD [facility]  06/27/2021, 11:18 AM  Please page via [place] [place] and do not message via secure chat for anything urgent. Secure chat can be used for anything non urgent.  How to contact the [facility] Attending or Consulting provider [place] or covering provider during after hours [place], for this patient?  Check the care team in [facility] and look for a) attending/consulting TRH provider listed and b) the [facility] team listed. Page or secure chat [phone]. Log into [email or web address] and use [place]'s universal password to access. If you do not have the password, please contact the hospital operator. Locate the [facility] provider you are looking for under [facility] and page to a number that you can be directly reached. If  you still have difficulty reaching the provider, please page the [facility] (Director on Call) for the Hospitalists listed on amion for assistance.

## 2021-06-27 NOTE — Consult Note (Signed)
[place] Nurse Consult Note: Reason for Consult:Stage 2 pressure injury to sacrum. Patient received care for this in the community. Wound type: Pressure plus moisture Pressure Injury POA: Yes Measurement:Per nursing flow sheet:  1cm x 12cm x 0.1cm Wound [id number], moist Drainage (amount, consistency, odor) scant serous Periwound: intact, dry Dressing procedure/placement/frequency: A mattress replacement was ordered on admission, heels are floated. Turning and repositioning is in plce, but I will provide guidance to minimize any time in the supine position. Topical care will be with a xeroform gauze wound contact layer topped with a silicone foam dressing. The xeroform is to be changed daily.  [place] nursing team will not follow, but will remain available to this patient, the nursing and medical teams.  Please re-consult if needed. Thanks, Maudie Flakes, MSN, RN, [place], Arther Abbott  Pager# [phone]

## 2021-06-28 ENCOUNTER — Inpatient Hospital Stay (HOSPITAL_COMMUNITY): Payer: Medicare HMO

## 2021-06-28 DIAGNOSIS — J9601 Acute respiratory failure with hypoxia: Secondary | ICD-10-CM | POA: Diagnosis not present

## 2021-06-28 LAB — CBC WITH DIFFERENTIAL/PLATELET
Abs Immature Granulocytes: 0.03 10*3/uL (ref 0.00–0.07)
Basophils Absolute: 0 10*3/uL (ref 0.0–0.1)
Basophils Relative: 0 %
Eosinophils Absolute: 0.1 10*3/uL (ref 0.0–0.5)
Eosinophils Relative: 1 %
HCT: 29.6 % — ABNORMAL LOW (ref 36.0–46.0)
Hemoglobin: 9.7 g/dL — ABNORMAL LOW (ref 12.0–15.0)
Immature Granulocytes: 1 %
Lymphocytes Relative: 40 %
Lymphs Abs: 2.3 10*3/uL (ref 0.7–4.0)
MCH: 30.8 pg (ref 26.0–34.0)
MCHC: 32.8 g/dL (ref 30.0–36.0)
MCV: 94 fL (ref 80.0–100.0)
Monocytes Absolute: 0.4 10*3/uL (ref 0.1–1.0)
Monocytes Relative: 7 %
Neutro Abs: 2.9 10*3/uL (ref 1.7–7.7)
Neutrophils Relative %: 51 %
Platelets: 155 10*3/uL (ref 150–400)
RBC: 3.15 MIL/uL — ABNORMAL LOW (ref 3.87–5.11)
RDW: 14.2 % (ref 11.5–15.5)
WBC: 5.6 10*3/uL (ref 4.0–10.5)
nRBC: 0 % (ref 0.0–0.2)

## 2021-06-28 LAB — FERRITIN: Ferritin: 164 ng/mL (ref 11–307)

## 2021-06-28 LAB — COMPREHENSIVE METABOLIC PANEL
ALT: 18 U/L (ref 0–44)
AST: 25 U/L (ref 15–41)
Albumin: 2.6 g/dL — ABNORMAL LOW (ref 3.5–5.0)
Alkaline Phosphatase: 64 U/L (ref 38–126)
Anion gap: 8 (ref 5–15)
BUN: 15 mg/dL (ref 8–23)
CO2: 23 mmol/L (ref 22–32)
Calcium: 8.3 mg/dL — ABNORMAL LOW (ref 8.9–10.3)
Chloride: 109 mmol/L (ref 98–111)
Creatinine, Ser: 0.54 mg/dL (ref 0.44–1.00)
GFR, Estimated: >60 mL/min (ref >60)
Glucose, Bld: 89 mg/dL (ref 70–99)
Potassium: 3.7 mmol/L (ref 3.5–5.1)
Sodium: 140 mmol/L (ref 135–145)
Total Bilirubin: 0.3 mg/dL (ref 0.3–1.2)
Total Protein: 5.5 g/dL — ABNORMAL LOW (ref 6.5–8.1)

## 2021-06-28 LAB — C-REACTIVE PROTEIN: CRP: 7.4 mg/dL — ABNORMAL HIGH (ref <1.0)

## 2021-06-28 LAB — MAGNESIUM: Magnesium: 1.9 mg/dL (ref 1.7–2.4)

## 2021-06-28 LAB — PHOSPHORUS: Phosphorus: 2.3 mg/dL — ABNORMAL LOW (ref 2.5–4.6)

## 2021-06-28 LAB — D-DIMER, QUANTITATIVE: D-Dimer, Quant: 0.53 ug/mL-FEU — ABNORMAL HIGH (ref 0.00–0.50)

## 2021-06-28 LAB — PROCALCITONIN: Procalcitonin: 0.94 ng/mL

## 2021-06-28 LAB — HEPARIN LEVEL (UNFRACTIONATED): Heparin Unfractionated: 0.92 IU/mL — ABNORMAL HIGH (ref 0.30–0.70)

## 2021-06-28 MED ORDER — ENOXAPARIN SODIUM 40 MG/0.4ML IJ SOSY: 40.0000 mg | PREFILLED_SYRINGE | INTRAMUSCULAR | Status: DC

## 2021-06-28 NOTE — Plan of Care (Signed)
  Problem: Education: Goal: Knowledge of General Education information will improve Description: Including pain rating scale, medication(s)/side effects and non-pharmacologic comfort measures Outcome: Progressing   Problem: Health Behavior/Discharge Planning: Goal: Ability to manage health-related needs will improve Outcome: Progressing   Problem: Clinical Measurements: Goal: Ability to maintain clinical measurements within normal limits will improve Outcome: Progressing Goal: Will remain free from infection Outcome: Progressing Goal: Diagnostic test results will improve Outcome: Progressing Goal: Respiratory complications will improve Outcome: Progressing Goal: Cardiovascular complication will be avoided Outcome: Progressing   Problem: Activity: Goal: Risk for activity intolerance will decrease Outcome: Progressing   Problem: Nutrition: Goal: Adequate nutrition will be maintained Outcome: Progressing   Problem: Coping: Goal: Level of anxiety will decrease Outcome: Progressing   Problem: Elimination: Goal: Will not experience complications related to bowel motility Outcome: Progressing Goal: Will not experience complications related to urinary retention Outcome: Progressing   Problem: Pain Managment: Goal: General experience of comfort will improve Outcome: Progressing   Problem: Safety: Goal: Ability to remain free from injury will improve Outcome: Progressing   Problem: Skin Integrity: Goal: Risk for impaired skin integrity will decrease Outcome: Progressing   Problem: Fluid Volume: Goal: Hemodynamic stability will improve Outcome: Progressing   Problem: Clinical Measurements: Goal: Diagnostic test results will improve Outcome: Progressing Goal: Signs and symptoms of infection will decrease Outcome: Progressing   Problem: Respiratory: Goal: Ability to maintain adequate ventilation will improve Outcome: Progressing   Problem: Education: Goal:  Knowledge of risk factors and measures for prevention of condition will improve Outcome: Progressing   Problem: Coping: Goal: Psychosocial and spiritual needs will be supported Outcome: Progressing   Problem: Respiratory: Goal: Will maintain a patent airway Outcome: Progressing Goal: Complications related to the disease process, condition or treatment will be avoided or minimized Outcome: Progressing   

## 2021-06-28 NOTE — Progress Notes (Signed)
Episode PROGRESS NOTE    [name]  DXA:[id number] DOB: 30-May-1946 DOA: 06/25/2021 PCP: Glenis Smoker, MD   Brief Narrative:  HPI: [name] is a 75 y.o. female with medical history significant of MS who is bedbound at baseline, hypertension, dyslipidemia, neurogenic bladder, trigeminal neuralgia, and DVT in 2013 no longer on anticoagulation who presents after being noted to be acutely altered this morning.  History is obtained from her son who is present at bedside.  Patient was noted by her son to be more lethargic this morning with increased work of breathing and foaming at the mouth.  The patient was not responding to him like normal concerned and called EMS.  Normally the patient is alert, but orientation to time and place sometimes waxes and wanes.  Usually she can at least talk in complete phrases/sentences.  The patient's son had recently come down with a productive cough earlier in the week and was productive.  His mother had feeling under the weather and not eating or drinking much for the last 3 days. She currently complains of abdominal pain, but is not able to say much more than.  The patient has been vaccinated for COVID and boosted x2 due to her history of MS. Son notes that she is no longer on anticoagulation, but is not totally clear on why. Upon EMS arrival patient was noted to be hypoxic into the 60s for which she was placed on nonrebreather with improvement in O2 saturations to 99%.   ED Course: Upon admission to the emergency department patient was seen to be afebrile, pulse 125-150, respirations 19-32, blood pressures maintained, and O2 saturations noted as low as 89% initially placed on nonrebreather with improvement in O2 saturations.  After suctioning patient able to be weaned down to 3 L of nasal cannula oxygen.  Labs significant for WBC 11, BNP 34.2, troponin 398, and lactic acid 1.9.  Chest x-ray noted enlarged cardio pericardial silhouette with  pulmonary vascular congestion and possible interstitial edema.  COVID-19 screening was positive.  Case had been discussed with cardiology due to the elevated troponin and it was thought symptoms could likely be secondary to demand and recommended hospitalist admission.  Patient had been given 81 mg of aspirin p.o., 10 mEq of potassium chloride IV, and started on normal saline at 100 mL/h for 1 L.    Assessment & Plan:   Principal Problem:   Acute respiratory failure with hypoxia (HCC) Active Problems:   Multiple sclerosis (HCC)   Hypertension   Trigeminal neuropathy   Abdominal pain   Acute metabolic encephalopathy   COVID-19 virus infection   Prolonged QT interval   SIRS (systemic inflammatory response syndrome) (HCC)   Elevated troponin   Cardiomegaly   Pressure injury of skin  Severe sepsis secondary to acute respiratory failure with hypoxia COVID-19 infection/aspiration pneumonia: Patient met criteria for severe sepsis based on tachycardia, tachypnea and hypoxia with bacterial pneumonia. COVID-positive.  Also procalcitonin elevated indicates possible bacterial pneumonia.  Inflammatory markers improving.  She is on room air and comfortable.  We will continue remdesivir, Rocephin and doxycycline, bronchodilators and dexamethasone.  Patient was encouraged to prone, out of bed to chair, to use incentive spirometry and flutter valve.  Since patient is improving, no indication of Actemra or baricitinib.   Elevated troponin cardiomegaly: Acute.  High-sensitivity troponin initially 398, but repeat 1150.  Chest x-ray noted enlarged cardiac silhouette.  No prior history reported of heart failure.  Lipid panel within normal range.  Echo shows LVEF of 20 to 25% and regional wall motion abnormalities.  Seen by cardiology.  They opined that she likely has a stress cardiomyopathy and have not recommended any intervention but recommended medical management.  They recommended continuing heparin through mid  day of 06/28/2021, will stop heparin today.   Acute metabolic/toxic encephalopathy: She was encephalopathy likely secondary to sepsis and infection.  Currently alert and oriented x2-3 and back to her baseline which is verified by the son.  Hypokalemia: Resolved.  Hypomagnesemia: Resolved.   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  CT abdomen suggests possible stercoral colitis and fecal impaction.  Constipation resolved.  No abdominal pain.   Essential hypertension: Home blood pressure medications include amlodipine, hydrochlorothiazide 25 mg nightly and losartan 100 mg daily.  All medications but Coreg on hold and blood pressure stable currently however she did have hypotension last night.   Multiple sclerosis: Patient is bedbound due to history of multiple sclerosis.  Followed by Dr. Jannifer Franklin of neurology in the outpatient setting and is on Avonex.    Trigeminal neuralgia -Continue camazepam and Lyrica   Prolonged QT interval: On admission QTC was calculated at 536.  EKG today shows QTC of 486, improved. -Avoid QT prolonging medications for now -Correct any electrolyte abnormalities   Dementia: At baseline now.  Continue to hold Aricept for now.   Hyperlipidemia: Continue atorvastatin.  DVT prophylaxis: enoxaparin (LOVENOX) injection 40 mg Start: 06/28/21 1100   Code Status: Full Code  Family Communication:  None present at bedside.  Plan of care discussed with son over the phone.  Status is: Inpatient  Remains inpatient appropriate because:IV treatments appropriate due to intensity of illness or inability to take PO  Dispo: The patient is from: Home              Anticipated d/c is to: Home              Patient currently is not medically stable to d/c.   Difficult to place patient No   Estimated body mass index is 28.12 kg/m as calculated from the following:   Height as of this encounter: _0  (1.626 m).   Weight as of this encounter: 74.3  kg.  Pressure Injury 06/25/21 Sacrum Mid Stage 2 -  Partial thickness loss of dermis presenting as a shallow open injury with a red, pink wound bed without slough. Healing;Wound Care at home;dry,flaky,slight bleeding (Active)  06/25/21 2015  Location: Sacrum  Location Orientation: Mid  Staging: Stage 2 -  Partial thickness loss of dermis presenting as a shallow open injury with a red, pink wound bed without slough.  Wound Description (Comments): Healing;Wound Care at home;dry,flaky,slight bleeding  Present on Admission: Yes    Nutritional Assessment: Body mass index is 28.12 kg/m.Marland Kitchen Seen by dietician.  I agree with the assessment and plan as outlined below: Nutrition Status:   Skin Assessment: I have examined the patient's skin and I agree with the wound assessment as performed by the wound care RN as outlined below: Pressure Injury 06/25/21 Sacrum Mid Stage 2 -  Partial thickness loss of dermis presenting as a shallow open injury with a red, pink wound bed without slough. Healing;Wound Care at home;dry,flaky,slight bleeding (Active)  06/25/21 2015  Location: Sacrum  Location Orientation: Mid  Staging: Stage 2 -  Partial thickness loss of dermis presenting as a shallow open injury with a red, pink wound bed without slough.  Wound Description (Comments): Healing;Wound Care at home;dry,flaky,slight bleeding  Present on  Admission: Yes    Consultants:  Cardiology-signed off  Procedures:  None  Antimicrobials:  Anti-infectives (From admission, onward)    Start     Dose/Rate Route Frequency Ordered Stop   06/26/21 1000  remdesivir 100 mg in sodium chloride 0.9 % 100 mL IVPB       See Hyperspace for full Linked Orders Report.   100 mg 200 mL/hr over 30 Minutes Intravenous Daily 06/25/21 1155 06/30/21 0959   06/25/21 2000  cefTRIAXone (ROCEPHIN) 2 g in sodium chloride 0.9 % 100 mL IVPB        2 g 200 mL/hr over 30 Minutes Intravenous Every 24 hours 06/25/21 1857     06/25/21 2000   doxycycline (VIBRAMYCIN) 100 mg in sodium chloride 0.9 % 250 mL IVPB        100 mg 125 mL/hr over 120 Minutes Intravenous Every 12 hours 06/25/21 1902     06/25/21 1200  remdesivir 200 mg in sodium chloride 0.9% 250 mL IVPB       See Hyperspace for full Linked Orders Report.   200 mg 580 mL/hr over 30 Minutes Intravenous Once 06/25/21 1155 06/25/21 1727          Subjective: Seen and examined.  Patient is fully alert and oriented.  She has no complaints.  Objective: Vitals:   06/27/21 2309 06/28/21 0000 06/28/21 0311 06/28/21 0400  BP:  (!) 112/59  (!) 89/45  Pulse:  67  64  Resp:  13  14  Temp: 97.7 F (36.5 C) 97.7 F (36.5 C) 97.9 F (36.6 C)   TempSrc: Oral Oral Oral   SpO2:  99%  99%  Weight:   74.3 kg   Height:        Intake/Output Summary (Last 24 hours) at 06/28/2021 1309 Last data filed at 06/28/2021 0700 Gross per 24 hour  Intake 2848.02 ml  Output 1600 ml  Net 1248.02 ml    Filed Weights   06/26/21 0500 06/27/21 0425 06/28/21 0311  Weight: 69 kg 69.9 kg 74.3 kg    Examination:  General exam: Appears calm and comfortable  Respiratory system: Coarse breath sounds with rhonchi bilaterally, respiratory effort normal. Cardiovascular system: S1 & S2 heard, RRR. No JVD, murmurs, rubs, gallops or clicks. No pedal edema. Gastrointestinal system: Abdomen is nondistended, soft and nontender. No organomegaly or masses felt. Normal bowel sounds heard. Central nervous system: Alert and oriented.  Paraplegic Extremities: Symmetric 5 x 5 power. Skin: No rashes, lesions or ulcers.  Psychiatry: Judgement and insight appear poor  Data Reviewed: I have personally reviewed following labs and imaging studies  CBC: Recent Labs  Lab 06/25/21 0821 06/25/21 0914 06/26/21 0437 06/27/21 0224 06/28/21 0729  WBC 11.0*  --  9.5 8.4 5.6  NEUTROABS 9.0*  --  7.3 5.6 2.9  HGB 12.8 12.6 12.3 10.0* 9.7*  HCT 38.6 37.0 35.5* 29.8* 29.6*  MCV 94.8  --  91.0 92.8 94.0  PLT  170  --  157 155 638    Basic Metabolic Panel: Recent Labs  Lab 06/25/21 0821 06/25/21 0914 06/26/21 0437 06/27/21 0224 06/28/21 0729  NA 136 137 139 138 140  K 3.6 3.4* 2.7* 2.8* 3.7  CL 100  --  103 101 109  CO2 23  --  _0 GLUCOSE 149*  --  133* 94 89  BUN 8  --  _1 CREATININE 0.54  --  0.54 0.68 0.54  CALCIUM 9.3  --  8.6*  7.8* 8.3*  MG  --   --  1.8 1.5* 1.9  PHOS  --   --  2.5 3.4 2.3*    GFR: Estimated Creatinine Clearance: 62.7 mL/min (by C-G formula based on SCr of 0.54 mg/dL). Liver Function Tests: Recent Labs  Lab 06/25/21 1618 06/26/21 0437 06/27/21 0224 06/28/21 0729  AST 31 35 30 25  ALT _0 ALKPHOS 89 86 62 64  BILITOT 0.8 0.7 0.5 0.3  PROT 6.2* 6.2* 5.4* 5.5*  ALBUMIN 3.0* 2.9* 2.6* 2.6*    No results for input(s): LIPASE, AMYLASE in the last 168 hours. No results for input(s): AMMONIA in the last 168 hours. Coagulation Profile: Recent Labs  Lab 06/25/21 1153 06/25/21 1621  INR 1.3* 1.2    Cardiac Enzymes: No results for input(s): CKTOTAL, CKMB, CKMBINDEX, TROPONINI in the last 168 hours. BNP (last 3 results) No results for input(s): PROBNP in the last 8760 hours. HbA1C: No results for input(s): HGBA1C in the last 72 hours. CBG: No results for input(s): GLUCAP in the last 168 hours. Lipid Profile: Recent Labs    06/26/21 0437  CHOL 175  HDL 45  LDLCALC 107*  TRIG 117  CHOLHDL 3.9    Thyroid Function Tests: Recent Labs    06/25/21 1621  TSH 1.178    Anemia Panel: Recent Labs    06/27/21 0224 06/28/21 0729  FERRITIN 165 164    Sepsis Labs: Recent Labs  Lab 06/25/21 0821 06/25/21 1034 06/25/21 1153 06/28/21 0729  PROCALCITON  --   --  1.95 0.94  LATICACIDVEN 1.9 1.9  --   --      Recent Results (from the past 240 hour(s))  Resp Panel by RT-PCR (Flu A&B, Covid) Nasopharyngeal Swab     Status: Abnormal   Collection Time: 06/25/21  8:20 AM   Specimen: Nasopharyngeal Swab;  Nasopharyngeal(NP) swabs in vial transport medium  Result Value Ref Range Status   SARS Coronavirus 2 by RT PCR POSITIVE (A) NEGATIVE Final    Comment: RESULT CALLED TO, READ BACK BY AND VERIFIED WITH: RN Gaspar Skeeters, [id number] (NOTE) SARS-CoV-2 target nucleic acids are DETECTED.  The SARS-CoV-2 RNA is generally detectable in upper respiratory specimens during the acute phase of infection. Positive results are indicative of the presence of the identified virus, but do not rule out bacterial infection or co-infection with other pathogens not detected by the test. Clinical correlation with patient history and other diagnostic information is necessary to determine patient infection status. The expected result is Negative.  Fact Sheet for Patients: EntrepreneurPulse.com.au  Fact Sheet for Healthcare Providers: IncredibleEmployment.be  This test is not yet approved or cleared by the [place] FDA and  has been authorized for detection and/or diagnosis of SARS-CoV-2 by FDA under an Emergency Use Authorization (EUA).  This EUA will remain in effect (meaning this test can be used)  for the duration of  the COVID-19 declaration under Section 564(b)(1) of the Act, 21 U.S.C. section 360bbb-3(b)(1), unless the authorization is terminated or revoked sooner.     Influenza A by PCR NEGATIVE NEGATIVE Final   Influenza B by PCR NEGATIVE NEGATIVE Final    Comment: (NOTE) The Xpert Xpress SARS-CoV-2/FLU/RSV plus assay is intended as an aid in the diagnosis of influenza from Nasopharyngeal swab specimens and should not be used as a sole basis for treatment. Nasal washings and aspirates are unacceptable for Xpert Xpress SARS-CoV-2/FLU/RSV testing.  Fact Sheet for Patients: EntrepreneurPulse.com.au  Fact Sheet for Healthcare Providers:  IncredibleEmployment.be  This test is not yet approved or cleared by the [place] and has been authorized for detection and/or diagnosis of SARS-CoV-2 by FDA under an Emergency Use Authorization (EUA). This EUA will remain in effect (meaning this test can be used) for the duration of the COVID-19 declaration under Section 564(b)(1) of the Act, 21 U.S.C. section 360bbb-3(b)(1), unless the authorization is terminated or revoked.  Performed at [facility], [place] [address] [place], [place] [address]   Culture, blood (routine x 2)     Status: None (Preliminary result)   Collection Time: 06/25/21  2:47 PM   Specimen: BLOOD LEFT FOREARM  Result Value Ref Range Status   Specimen Description BLOOD LEFT FOREARM  Final   Special Requests   Final    BOTTLES DRAWN AEROBIC AND ANAEROBIC Blood Culture results may not be optimal due to an inadequate volume of blood received in culture bottles   Culture   Final    NO GROWTH 3 DAYS Performed at [facility], [place] [address] [place], [place] [address]    Report Status PENDING  Incomplete  Culture, blood (routine x 2)     Status: None (Preliminary result)   Collection Time: 06/25/21  4:18 PM   Specimen: BLOOD  Result Value Ref Range Status   Specimen Description BLOOD BLOOD LEFT FOREARM  Final   Special Requests   Final    BOTTLES DRAWN AEROBIC AND ANAEROBIC Blood Culture results may not be optimal due to an inadequate volume of blood received in culture bottles   Culture   Final    NO GROWTH 3 DAYS Performed at [facility], [place] [address] [place], [place] [address]    Report Status PENDING  Incomplete       Radiology Studies: DG CHEST PORT 1 VIEW  Result Date: 06/28/2021 CLINICAL DATA:  Respiratory distress.  COVID. EXAM: PORTABLE CHEST 1 VIEW COMPARISON:  06/25/2021 FINDINGS: Stable cardiomediastinal contours. No pleural effusion or edema. Aortic atherosclerosis. Increased opacities within the retrocardiac left lung base noted. Right lung is clear. IMPRESSION: Increased opacities within the  retrocardiac left lung base compatible with atelectasis and or pneumonia. Electronically Signed   By: Kerby Moors M.D.   On: 06/28/2021 08:42    Scheduled Meds:  albuterol  2 puff Inhalation Q6H   vitamin C  500 mg Oral Daily   atorvastatin  40 mg Oral QHS   baclofen  10 mg Oral QID   buPROPion  300 mg Oral Daily   carbamazepine  200 mg Oral TID   carvedilol  3.125 mg Oral BID WC   dexamethasone  6 mg Oral Daily   enoxaparin (LOVENOX) injection  40 mg Subcutaneous Q24H   famotidine (PEPCID) IV  20 mg Intravenous Q12H   lamoTRIgine  25 mg Oral QHS   lisinopril  2.5 mg Oral Daily   mouth rinse  15 mL Mouth Rinse BID   pregabalin  100 mg Oral BID   sertraline  75 mg Oral Daily   sodium chloride flush  3 mL Intravenous Q12H   zinc sulfate  220 mg Oral Daily   Continuous Infusions:  cefTRIAXone (ROCEPHIN)  IV Stopped (06/27/21 2125)   doxycycline (VIBRAMYCIN) IV 100 mg (06/28/21 0956)   remdesivir 100 mg in NS 100 mL 100 mg (06/28/21 1005)     LOS: 3 days   Time spent: 29 minutes   Darliss Cheney, MD [facility]  06/28/2021, 1:09 PM  Please page via [place] and do not message via secure chat for anything urgent. Secure chat can be used for anything non urgent.  How to contact the [facility] Attending or Consulting provider [place] or covering provider during after hours [place], for this patient?  Check the care team in [facility] and look for a) attending/consulting TRH provider listed and b) the [facility] team listed. Page or secure chat [phone]. Log into [email or web address] and use [place]'s universal password to access. If you do not have the password, please contact the hospital operator. Locate the [facility] provider you are looking for under [facility] and page to a number that you can be directly reached. If you still have difficulty reaching the provider, please page the [facility] (Director on Call) for the Hospitalists listed on amion for assistance.

## 2021-06-28 NOTE — Progress Notes (Signed)
Speech Language Pathology Treatment: Dysphagia  Patient Details Name: [name] MRN: [id number] DOB: 04-Aug-1946 Today's Date: 06/28/2021 Time: 9987-2158 SLP Time Calculation (min) (ACUTE ONLY): 11 min  Assessment / Plan / Recommendation Clinical Impression  Pt has done well with her PO intake per nursing.  During our session today, she was alert and communicative, demonstrated active mastication, good effort, no s/s of dysphagia or aspiration with regular solids/thin liquids.  DG chest from today shows increasing opacities but doubt aspiration is contributory based on lack of clinical indicators for dysphagia 9/17 and today.  No further SLP f/u is needed - our service will sign off.   HPI HPI: [name] is a 75 y.o. female with medical history significant of MS who is bedbound at baseline, hypertension, dyslipidemia, neurogenic bladder, trigeminal neuralgia, and DVT in 2013 no longer on anticoagulation who presents after being noted to be acutely altered morning of 06/25/21.      SLP Plan  All goals met      Recommendations for follow up therapy are one component of a multi-disciplinary discharge planning process, led by the attending physician.  Recommendations may be updated based on patient status, additional functional criteria and insurance authorization.    Recommendations  Diet recommendations: Regular;Thin liquid Liquids provided via: Cup;Straw Medication Administration: Crushed with puree Supervision: Patient able to self feed (assist with set-up)                Oral Care Recommendations: Oral care BID Follow up Recommendations: None SLP Visit Diagnosis: Dysphagia, unspecified (R13.10) Plan: All goals met       GO               Killian Schwer L. Tivis Ringer, [place] Office number [phone] Pager [phone]  Juan Quam Laurice  06/28/2021, 3:35 PM

## 2021-06-29 DIAGNOSIS — A419 Sepsis, unspecified organism: Secondary | ICD-10-CM | POA: Diagnosis present

## 2021-06-29 DIAGNOSIS — R6520 Severe sepsis without septic shock: Secondary | ICD-10-CM | POA: Diagnosis present

## 2021-06-29 DIAGNOSIS — J9601 Acute respiratory failure with hypoxia: Secondary | ICD-10-CM | POA: Diagnosis not present

## 2021-06-29 DIAGNOSIS — J189 Pneumonia, unspecified organism: Secondary | ICD-10-CM

## 2021-06-29 LAB — CBC WITH DIFFERENTIAL/PLATELET
Abs Immature Granulocytes: 0.04 10*3/uL (ref 0.00–0.07)
Basophils Absolute: 0 10*3/uL (ref 0.0–0.1)
Basophils Relative: 0 %
Eosinophils Absolute: 0.1 10*3/uL (ref 0.0–0.5)
Eosinophils Relative: 1 %
HCT: 30.1 % — ABNORMAL LOW (ref 36.0–46.0)
Hemoglobin: 10 g/dL — ABNORMAL LOW (ref 12.0–15.0)
Immature Granulocytes: 1 %
Lymphocytes Relative: 33 %
Lymphs Abs: 1.6 10*3/uL (ref 0.7–4.0)
MCH: 30.9 pg (ref 26.0–34.0)
MCHC: 33.2 g/dL (ref 30.0–36.0)
MCV: 92.9 fL (ref 80.0–100.0)
Monocytes Absolute: 0.5 10*3/uL (ref 0.1–1.0)
Monocytes Relative: 10 %
Neutro Abs: 2.8 10*3/uL (ref 1.7–7.7)
Neutrophils Relative %: 55 %
Platelets: 166 10*3/uL (ref 150–400)
RBC: 3.24 MIL/uL — ABNORMAL LOW (ref 3.87–5.11)
RDW: 14 % (ref 11.5–15.5)
WBC: 5 10*3/uL (ref 4.0–10.5)
nRBC: 0 % (ref 0.0–0.2)

## 2021-06-29 LAB — COMPREHENSIVE METABOLIC PANEL
ALT: 17 U/L (ref 0–44)
AST: 23 U/L (ref 15–41)
Albumin: 2.7 g/dL — ABNORMAL LOW (ref 3.5–5.0)
Alkaline Phosphatase: 67 U/L (ref 38–126)
Anion gap: 10 (ref 5–15)
BUN: 15 mg/dL (ref 8–23)
CO2: 21 mmol/L — ABNORMAL LOW (ref 22–32)
Calcium: 8.5 mg/dL — ABNORMAL LOW (ref 8.9–10.3)
Chloride: 107 mmol/L (ref 98–111)
Creatinine, Ser: 0.44 mg/dL (ref 0.44–1.00)
GFR, Estimated: >60 mL/min (ref >60)
Glucose, Bld: 90 mg/dL (ref 70–99)
Potassium: 3.5 mmol/L (ref 3.5–5.1)
Sodium: 138 mmol/L (ref 135–145)
Total Bilirubin: 0.4 mg/dL (ref 0.3–1.2)
Total Protein: 5.4 g/dL — ABNORMAL LOW (ref 6.5–8.1)

## 2021-06-29 LAB — D-DIMER, QUANTITATIVE: D-Dimer, Quant: 0.95 ug/mL-FEU — ABNORMAL HIGH (ref 0.00–0.50)

## 2021-06-29 LAB — MAGNESIUM: Magnesium: 1.8 mg/dL (ref 1.7–2.4)

## 2021-06-29 LAB — FERRITIN: Ferritin: 110 ng/mL (ref 11–307)

## 2021-06-29 LAB — PHOSPHORUS: Phosphorus: 2.8 mg/dL (ref 2.5–4.6)

## 2021-06-29 LAB — C-REACTIVE PROTEIN: CRP: 4.5 mg/dL — ABNORMAL HIGH (ref <1.0)

## 2021-06-29 MED ORDER — CARVEDILOL 3.125 MG PO TABS: 3.1250 mg | ORAL_TABLET | Freq: Two times a day (BID) | ORAL | 0 refills | Status: DC

## 2021-06-29 MED ORDER — DEXAMETHASONE 6 MG PO TABS: 6.0000 mg | ORAL_TABLET | Freq: Every day | ORAL | 0 refills | Status: AC

## 2021-06-29 MED ORDER — ASPIRIN EC 81 MG PO TBEC: 81.0000 mg | DELAYED_RELEASE_TABLET | Freq: Every day | ORAL | 0 refills | Status: AC

## 2021-06-29 MED ORDER — POTASSIUM CHLORIDE CRYS ER 20 MEQ PO TBCR
40.0000 meq | EXTENDED_RELEASE_TABLET | Freq: Once | ORAL | Status: AC
  Administered 2021-06-29: 40 meq via ORAL
  Filled 2021-06-29: qty 2

## 2021-06-29 NOTE — Discharge Summary (Addendum)
Physician Discharge Summary  [name] NLZ:[id number] DOB: 06-02-1946 DOA: 06/25/2021  PCP: Glenis Smoker, MD  Admit date: 06/25/2021 Discharge date: 06/29/2021 30 Day Unplanned Readmission Risk Score    Flowsheet Row ED to Hosp-Admission (Current) from 06/25/2021 in [place] [facility]  30 Day Unplanned Readmission Risk Score (%) 19.37 Filed at 06/29/2021 0400       This score is the patient's risk of an unplanned readmission within 30 days of being discharged (0 -100%). The score is based on dignosis, age, lab data, medications, orders, and past utilization.   Low:  0-14.9   Medium: 15-21.9   High: 22-29.9   Extreme: 30 and above          Admitted From: Home Disposition: Home  Recommendations for Outpatient Follow-up:  Follow up with PCP in 1-2 weeks Please obtain BMP/CBC in one week Please follow up with your PCP on the following pending results: Unresulted Labs (From admission, onward)     Start     Ordered   06/26/21 0500  CBC with Differential/Platelet  Daily,   R      06/25/21 1155   06/26/21 0500  Comprehensive metabolic panel  Daily,   R      06/25/21 1155   06/26/21 0500  C-reactive protein  Daily,   R      06/25/21 1155   06/26/21 0500  D-dimer, quantitative  Daily,   R      06/25/21 1155   06/26/21 0500  Ferritin  Daily,   R      06/25/21 1155   06/26/21 0500  Magnesium  Daily,   R      06/25/21 1155   06/26/21 0500  Phosphorus  Daily,   R      06/25/21 [place]: None Equipment/Devices: None  Discharge Condition: Stable CODE STATUS: Full code Diet recommendation: Cardiac  Subjective: Seen and examined.  Fully alert and oriented.  Has no complaints.  Very pleasant.  Ready to go home today.  Following HPI and ED course is copied from my colleague admitting hospitalist Dr. Rosita Fire H&P. HPI: [name] is a 75 y.o. female with medical history significant of MS who is bedbound at baseline,  hypertension, dyslipidemia, neurogenic bladder, trigeminal neuralgia, and DVT in 2013 no longer on anticoagulation who presents after being noted to be acutely altered this morning.  History is obtained from her son who is present at bedside.  Patient was noted by her son to be more lethargic this morning with increased work of breathing and foaming at the mouth.  The patient was not responding to him like normal concerned and called EMS.  Normally the patient is alert, but orientation to time and place sometimes waxes and wanes.  Usually she can at least talk in complete phrases/sentences.  The patient's son had recently come down with a productive cough earlier in the week and was productive.  His mother had feeling under the weather and not eating or drinking much for the last 3 days. She currently complains of abdominal pain, but is not able to say much more than.  The patient has been vaccinated for COVID and boosted x2 due to her history of MS. Son notes that she is no longer on anticoagulation, but is not totally clear on why. Upon EMS arrival patient was noted to be hypoxic into the 60s for which she  was placed on nonrebreather with improvement in O2 saturations to 99%.   ED Course: Upon admission to the emergency department patient was seen to be afebrile, pulse 125-150, respirations 19-32, blood pressures maintained, and O2 saturations noted as low as 89% initially placed on nonrebreather with improvement in O2 saturations.  After suctioning patient able to be weaned down to 3 L of nasal cannula oxygen.  Labs significant for WBC 11, BNP 34.2, troponin 398, and lactic acid 1.9.  Chest x-ray noted enlarged cardio pericardial silhouette with pulmonary vascular congestion and possible interstitial edema.  COVID-19 screening was positive.  Case had been discussed with cardiology due to the elevated troponin and it was thought symptoms could likely be secondary to demand and recommended hospitalist admission.   Patient had been given 81 mg of aspirin p.o., 10 mEq of potassium chloride IV, and started on normal saline at 100 mL/h for 1 L.  Brief/Interim Summary: Patient was admitted due to severe sepsis and acute hypoxic respiratory failure secondary to COVID-19 pneumonia and aspiration pneumonia as well as elevated troponin and acute toxic encephalopathy.  Detailed hospitalization course as below.  Severe sepsis secondary to acute respiratory failure with hypoxia COVID-19 infection/aspiration pneumonia: Patient met criteria for severe sepsis based on tachycardia, tachypnea and hypoxia with bacterial pneumonia. COVID-positive.  Also procalcitonin elevated indicates possible bacterial pneumonia.  Inflammatory markers improving.  She is on room air and comfortable since last 2 days.  Patient completed 5 days of remdesivir.  As well as 5 days of Rocephin and Zithromax.  Patient is going to be discharged today on 6 more days of oral dexamethasone to complete 10 days course..     Elevated troponin cardiomegaly: Acute.  High-sensitivity troponin initially 398, but repeat 1150.  Chest x-ray noted enlarged cardiac silhouette.  No prior history reported of heart failure.  Lipid panel within normal range.  Echo shows LVEF of 20 to 25% and regional wall motion abnormalities.  Seen by cardiology.  They opined that she likely has a stress cardiomyopathy and have not recommended any intervention but recommended medical management.  They recommended continuing heparin for total of 2 days which ended through mid day of 06/28/2021.  Cardiology started her on Coreg 3.125 mg twice daily.  They also recommended aspirin 81 mg p.o. daily.   Acute metabolic/toxic encephalopathy: She was encephalopathy likely secondary to sepsis and infection.  Currently alert and oriented x3 and back to her baseline which is verified by the son.   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  CT abdomen suggests possible stercoral  colitis and fecal impaction.  Constipation resolved.  No abdominal pain.   Essential hypertension: Home blood pressure medications include amlodipine, hydrochlorothiazide 25 mg nightly and losartan 100 mg daily.  She was started on Coreg by cardiology here.  All medications but Coreg on hold and blood pressure stable until yesterday but now started to go up.  We are discontinuing her amlodipine but resuming all her antihypertensives and adding Coreg.     Multiple sclerosis/functional quadriplegia: Patient is bedbound due to history of multiple sclerosis.  Followed by Dr. Jannifer Franklin of neurology in the outpatient setting and is on Avonex.    Prolonged QT interval: On admission QTC was calculated at 536.  EKG today shows QTC of 486, improved. -Avoid QT prolonging medications for now -Correct any electrolyte abnormalities   Discharge Diagnoses:  Principal Problem:   Acute respiratory failure with hypoxia (HCC) Active Problems:   Multiple sclerosis (HCC)   Hypertension  Trigeminal neuropathy   Abdominal pain   Acute metabolic encephalopathy   COVID-19 virus infection   Prolonged QT interval   Elevated troponin   Cardiomegaly   Pressure injury of skin   Severe sepsis ([place])   Community acquired pneumonia    Discharge Instructions   Allergies as of 06/29/2021       Reactions   Prednisone Other (See Comments)   Caused thrush- will need something to reverse this   Shellfish Allergy Hives      Wound Dressing Adhesive    Splits the skin        Medication List     STOP taking these medications    acetaminophen 500 MG tablet Commonly known as: TYLENOL   amLODipine 10 MG tablet Commonly known as: NORVASC       TAKE these medications    APPLE CIDER VINEGAR PO Take 450 mg by mouth daily.   atorvastatin 40 MG tablet Commonly known as: LIPITOR Take 40 mg by mouth at bedtime.   Avonex Pen 30 MCG/0.5ML Ajkt Generic drug: Interferon Beta-1a INJECT 30 MCG INTRAMUSCULARLY  ONCE WEEKLY What changed: when to take this   baclofen 10 MG tablet Commonly known as: LIORESAL Take 1 tablet (10 mg total) by mouth 4 (four) times daily.   buPROPion 300 MG 24 hr tablet Commonly known as: WELLBUTRIN XL Take 300 mg by mouth daily.   carbamazepine 200 MG tablet Commonly known as: TEGRETOL TAKE 1 TABLET BY MOUTH 3 TIMES A DAY What changed:  how much to take how to take this when to take this   carvedilol 3.125 MG tablet Commonly known as: COREG Take 1 tablet (3.125 mg total) by mouth 2 (two) times daily with a meal.   CRANBERRY PO Take 1 tablet by mouth daily.   DESITIN CLEAR EX Apply 1 application topically daily as needed (for skin protection).   dexamethasone 6 MG tablet Commonly known as: DECADRON Take 1 tablet (6 mg total) by mouth daily for 6 days.   Digestive Adv Prebiot+Probiot Chew Chew 1 tablet by mouth daily.   docusate sodium 100 MG capsule Commonly known as: COLACE Take 1 capsule (100 mg total) by mouth 2 (two) times daily. What changed:  when to take this additional instructions   donepezil 5 MG tablet Commonly known as: ARICEPT Take 1 tablet (5 mg total) by mouth daily.   Fish Oil 600 MG Caps Take 1,200 mg by mouth daily.   hydrochlorothiazide 25 MG tablet Commonly known as: HYDRODIURIL Take 25 mg by mouth at bedtime.   Klor-Con M20 20 MEQ tablet Generic drug: potassium chloride SA Take 20 mEq by mouth 3 (three) times daily.   lamoTRIgine 25 MG tablet Commonly known as: LAMICTAL Take 25 mg by mouth at bedtime.   losartan 100 MG tablet Commonly known as: COZAAR Take 100 mg by mouth daily in the afternoon.   Multivitamin Gummies Adult Chew Chew 2 tablets by mouth every morning.   NON FORMULARY Take 1 tablet by mouth See admin instructions. Super Greens tablets- Take 1 tablet by mouth once a day   pregabalin 100 MG capsule Commonly known as: LYRICA Take 1 capsule (100 mg total) by mouth 2 (two) times daily. What  changed: when to take this   sertraline 50 MG tablet Commonly known as: ZOLOFT Take 75 mg by mouth daily.   SUPER B COMPLEX/C PO Take 1 tablet by mouth daily.   traMADol 50 MG tablet Commonly known as: ULTRAM TAKE 1  TABLET (50 MG TOTAL) BY MOUTH EVERY 6 (SIX) HOURS AS NEEDED FOR MODERATE PAIN What changed: how much to take   TURMERIC PO Take 450 mg by mouth daily.   vitamin A & D ointment Apply 1 application topically daily as needed for dry skin (or wound care).   VITAMIN D-3 PO Take 1 capsule by mouth daily.   white petrolatum-zinc oxide cream Apply topically as needed. What changed:  how much to take when to take this reasons to take this        Follow-up Information     Glenis Smoker, MD Follow up.   Specialty: Family Medicine Contact information: [place] [address] [phone]         Glenis Smoker, MD Follow up in 1 week(s).   Specialty: Family Medicine Contact information: [place] [address] [phone]                Allergies  Allergen Reactions   Prednisone Other (See Comments)    Caused thrush- will need something to reverse this   Shellfish Allergy Hives        Wound Dressing Adhesive     Splits the skin    Consultations: Cardiology   Procedures/Studies: CT Angio Chest Pulmonary Embolism (PE) W or WO Contrast  Result Date: 06/25/2021 CLINICAL DATA:  Shortness of breath with nausea and vomiting EXAM: CT ANGIOGRAPHY CHEST WITH CONTRAST TECHNIQUE: Multidetector CT imaging of the chest was performed using the standard protocol during bolus administration of intravenous contrast. Multiplanar CT image reconstructions and MIPs were obtained to evaluate the vascular anatomy. CONTRAST:  75 cc OMNIPAQUE IOHEXOL 350 MG/ML SOLN COMPARISON:  Chest radiograph 06/25/2021 FINDINGS: Cardiovascular: No filling defect is identified in the pulmonary arterial tree to suggest  pulmonary embolus. Coronary, aortic arch, and branch vessel atherosclerotic vascular disease. Mild cardiomegaly. Mild posterior pericardial effusion. Mediastinum/Nodes: No pathologic adenopathy identified. Lungs/Pleura: Dependent airspace opacities in both lower lobes, with frothy filling defect in the left mainstem bronchus filling the left lower lobe bronchus. Bilateral airway thickening is present and there is some patchy peribronchovascular ground-glass opacity in the right upper lobe and a small amount of ground-glass density airspace opacity anteriorly in the apical segment right upper lobe as on image 15 series 4. Appearance raises concern for possible aspiration pneumonitis/multilobar pneumonia. Sub solid 5 mm right middle lobe pulmonary nodule on image 58 series 4. Upper Abdomen: Deferred to dedicated CT abdomen report. Musculoskeletal: Degenerative right sternoclavicular arthropathy. Bilateral degenerative glenohumeral arthropathy, right greater than left. Thoracic spondylosis. Review of the MIP images confirms the above findings. IMPRESSION: 1. No filling defect is identified in the pulmonary arterial tree to suggest pulmonary embolus. 2. Dependent airspace opacities in both lower lobes with frothy filling defect in the left mainstem bronchus and filling the left lower lobe bronchus. Appearance suggest aspiration pneumonitis or multilobar pneumonia. 3. Bilateral airway thickening is also present with some patchy right upper lobe peribronchovascular ground-glass opacity and a ground-glass density airspace opacity anteriorly in the apical segment right upper lobe. 4. Sub solid 5 mm right middle lobe pulmonary nodule on image 58 series 4. No follow-up recommended. This recommendation follows the consensus statement: Guidelines for Management of Incidental Pulmonary Nodules Detected on CT Images: From the Fleischner Society 2017; Radiology 2017; 284:228-243. 5. Mild cardiomegaly small posterior pericardial  effusion. 6.  Aortic Atherosclerosis (ICD10-I70.0).  Coronary atherosclerosis. Electronically Signed   By: Van Clines M.D.   On: 06/25/2021 16:33  CT ABDOMEN PELVIS W CONTRAST  Result Date: 06/25/2021 CLINICAL DATA:  Acute abdominal pain. Shortness of breath with nausea vomiting. EXAM: CT ABDOMEN AND PELVIS WITH CONTRAST TECHNIQUE: Multidetector CT imaging of the abdomen and pelvis was performed using the standard protocol following bolus administration of intravenous contrast. CONTRAST:  140mL OMNIPAQUE IOHEXOL 350 MG/ML SOLN COMPARISON:  09/22/2011 FINDINGS: Lower chest: Bibasilar airspace opacities as shown on the CT chest, cannot exclude aspiration pneumonitis or multilobar pneumonia. Mild cardiomegaly. Atherosclerotic disease of the descending thoracic aorta. Hepatobiliary: Nonspecific 4 mm hypodense lesion of the right hepatic lobe inferiorly on image 33 series 6. Gallbladder unremarkable. Pancreas: Unremarkable Spleen: Unremarkable Adrenals/Urinary Tract: A 1.0 by 0.7 cm hypodense lesion in the left kidney lower pole is probably a cyst on image 25 of series 11, slightly larger than it was in 2012, but technically nonspecific due to small size. No hydronephrosis or hydroureter. Adrenal glands normal. Stomach/Bowel: Large amount of stool in the rectum with mild wall thickening in the rectum, cannot exclude stercoral colitis. The sigmoid colon deviate surround an 8.0 by 5.6 by 11.0 cm calcified mass in the pelvis which is most likely a subserosal uterine fibroid along the dorsum of the uterus. There is mild prominence of stool in the transverse and descending colon as well. Vascular/Lymphatic: Atherosclerosis is present, including aortoiliac atherosclerotic disease. Reproductive: Large calcified mass along the dorsum of the uterus, likely a fibroid. Small uterine fibroids noted. Other: No supplemental non-categorized findings. Musculoskeletal: Bridging spurring of the sacroiliac joints. Lumbar  spondylosis. Broad umbilical hernia contains a loop of small bowel without strangulation or obstruction. Spurring at the right hamstring tendon origination site. Spurring in the hips. IMPRESSION: 1. Prominent rectal stool stool suggesting fecal impaction. Wall thickening in the right dome could reflect stercoral colitis. No gas in the bowel wall currently. There is also some mild prominence of stool in the rest of the distal colon. 2. 11 cm in long axis calcified pelvic mass just above the uterus compatible with a large calcified uterine fibroid. 3. Bibasilar airspace opacities, please see CT chest for further characterization. 4.  Aortic Atherosclerosis (ICD10-I70.0). 5. Broad umbilical hernia with a loop of small bowel without strangulation or obstruction. Electronically Signed   By: Van Clines M.D.   On: 06/25/2021 16:44   DG CHEST PORT 1 VIEW  Result Date: 06/28/2021 CLINICAL DATA:  Respiratory distress.  COVID. EXAM: PORTABLE CHEST 1 VIEW COMPARISON:  06/25/2021 FINDINGS: Stable cardiomediastinal contours. No pleural effusion or edema. Aortic atherosclerosis. Increased opacities within the retrocardiac left lung base noted. Right lung is clear. IMPRESSION: Increased opacities within the retrocardiac left lung base compatible with atelectasis and or pneumonia. Electronically Signed   By: Kerby Moors M.D.   On: 06/28/2021 08:42   DG Chest Portable 1 View  Result Date: 06/25/2021 CLINICAL DATA:  Shortness of breath and respiratory distress. EXAM: PORTABLE CHEST 1 VIEW COMPARISON:  09/28/2011 FINDINGS: 0840 hours. The cardio pericardial silhouette is enlarged. There is pulmonary vascular congestion possible interstitial edema. No consolidation. No effusion.Telemetry leads overlie the chest. IMPRESSION: Enlarged cardiopericardial silhouette with pulmonary vascular congestion and possible interstitial edema. Electronically Signed   By: Misty Stanley M.D.   On: 06/25/2021 08:58   ECHOCARDIOGRAM  COMPLETE  Result Date: 06/25/2021    ECHOCARDIOGRAM REPORT   Patient Name:   [name] Date of Exam: 06/25/2021 Medical Rec #:  [id number]         Height:       65.0 in Accession #:  [id number]        Weight:       190.0 lb Date of Birth:  12-Aug-1946        BSA:          1.936 m Patient Age:    75 years          BP:           116/67 mmHg Patient Gender: F                 HR:           131 bpm. Exam Location:  Inpatient Procedure: 2D Echo, Cardiac Doppler, Color Doppler and Intracardiac            Opacification Agent                           STAT ECHO Reported to: Dr Rudean Haskell on 06/25/2021 1:31:00 PM.                                 MODIFIED REPORT:     This report was modified by Rudean Haskell MD on 06/25/2021 due to                                 [facility] team.  Indications:     Elevated troponin  History:         Patient has no prior history of Echocardiogram examinations.                  Signs/Symptoms:Dyspnea; Risk Factors:Dyslipidemia and                  Hypertension. COIVD-19. Multiple sclerois. Hx DVT.  Sonographer:     Clayton Lefort RDCS (AE) Referring Phys:  [id number] [facility] A SMITH Diagnosing Phys: Rudean Haskell MD IMPRESSIONS  1. Left ventricular ejection fraction, by estimation, is 20 to 25%. The left ventricle has severely decreased function. The left ventricle demonstrates regional wall motion abnormalities (significant LV dysfunction with basal sparing). There is severe asymmetric left ventricular hypertrophy. Left ventricular diastolic parameters are indeterminate.  2. Right ventricular systolic function is normal. The right ventricular size is normal.  3. Moderate pleural effusion.  4. The mitral valve is normal in structure. Mild mitral valve regurgitation.  5. The aortic valve is tricuspid. There is mild thickening of the aortic valve. Aortic valve regurgitation is mild. Comparison(s): No prior Echocardiogram. Conclusion(s)/Recommendation(s): Cardiology has been  consulted; they have been made aware. FINDINGS  Left Ventricle: Left ventricular ejection fraction, by estimation, is 20 to 25%. The left ventricle has severely decreased function. The left ventricle demonstrates regional wall motion abnormalities. Definity contrast agent was given IV to delineate the left ventricular endocardial borders. The left ventricular internal cavity size was normal in size. There is severe asymmetric left ventricular hypertrophy. Left ventricular diastolic parameters are indeterminate.  LV Wall Scoring: The apical septal segment, apical inferior segment, and apex are dyskinetic. The mid and distal anterior wall, mid and distal lateral wall, mid anteroseptal segment, mid anterolateral segment, mid inferoseptal segment, and mid inferior segment are hypokinetic. Right Ventricle: The right ventricular size is normal. No increase in right ventricular wall thickness. Right ventricular systolic function is normal. Left Atrium: Left atrial size was normal in size. Right Atrium: Right atrial size was normal in size.  Pericardium: Trivial pericardial effusion is present. Mitral Valve: The mitral valve is normal in structure. Mild mitral valve regurgitation. Tricuspid Valve: The tricuspid valve is normal in structure. Tricuspid valve regurgitation is mild. Aortic Valve: The aortic valve is tricuspid. There is mild thickening of the aortic valve. Aortic valve regurgitation is mild. Aortic regurgitation PHT measures 445 msec. Aortic valve mean gradient measures 2.0 mmHg. Aortic valve peak gradient measures 4.6 mmHg. Aortic valve area, by VTI measures 3.17 cm. Pulmonic Valve: The pulmonic valve was not well visualized. Pulmonic valve regurgitation is not visualized. No evidence of pulmonic stenosis. Aorta: The aortic root and ascending aorta are structurally normal, with no evidence of dilitation. IAS/Shunts: The interatrial septum was not well visualized. Additional Comments: There is a moderate pleural  effusion.  LEFT VENTRICLE PLAX 2D LVIDd:         4.00 cm LVIDs:         2.70 cm LV PW:         1.70 cm LV IVS:        1.50 cm LVOT diam:     2.00 cm LV SV:         42 LV SV Index:   22 LVOT Area:     3.14 cm  RIGHT VENTRICLE RV Basal diam:  2.70 cm RV S prime:     19.70 cm/s LEFT ATRIUM           Index       RIGHT ATRIUM           Index LA diam:      2.50 cm 1.29 cm/m  RA Area:     11.80 cm LA Vol (A2C): 45.0 ml 23.25 ml/m RA Volume:   26.80 ml  13.85 ml/m LA Vol (A4C): 41.6 ml 21.49 ml/m  AORTIC VALVE AV Area (Vmax):    2.58 cm AV Area (Vmean):   2.80 cm AV Area (VTI):     3.17 cm AV Vmax:           107.00 cm/s AV Vmean:          70.500 cm/s AV VTI:            0.133 m AV Peak Grad:      4.6 mmHg AV Mean Grad:      2.0 mmHg LVOT Vmax:         87.80 cm/s LVOT Vmean:        62.800 cm/s LVOT VTI:          0.134 m LVOT/AV VTI ratio: 1.01 AI PHT:            445 msec  AORTA Ao Root diam: 3.00 cm Ao Asc diam:  3.60 cm TRICUSPID VALVE TR Peak grad:   25.8 mmHg TR Vmax:        254.00 cm/s  SHUNTS Systemic VTI:  0.13 m Systemic Diam: 2.00 cm Rudean Haskell MD Electronically signed by Rudean Haskell MD Signature Date/Time: 06/25/2021/2:01:30 PM    Final (Updated)      Discharge Exam: Vitals:   06/29/21 0000 06/29/21 0400  BP: 136/73 (!) 147/84  Pulse: 77 70  Resp: 20 19  Temp: 98.2 F (36.8 C) 97.6 F (36.4 C)  SpO2: 97% 98%   Vitals:   06/28/21 0400 06/28/21 1930 06/29/21 0000 06/29/21 0400  BP: (!) 89/45 (!) 154/78 136/73 (!) 147/84  Pulse: 64 75 77 70  Resp: 14 (!) _0 Temp:  98.8 F (37.1 C) 98.2 F (  36.8 C) 97.6 F (36.4 C)  TempSrc:  Oral Oral Oral  SpO2: 99% 100% 97% 98%  Weight:    74 kg  Height:        General: Pt is alert, awake, not in acute distress Cardiovascular: RRR, S1/S2 +, no rubs, no gallops Respiratory: Coarse breath sounds with rhonchi bilaterally.  Otherwise comfortable with no dyspnea or tachypnea. Abdominal: Soft, NT, ND, bowel sounds  + Extremities: no edema, no cyanosis    The results of significant diagnostics from this hospitalization (including imaging, microbiology, ancillary and laboratory) are listed below for reference.     Microbiology: Recent Results (from the past 240 hour(s))  Resp Panel by RT-PCR (Flu A&B, Covid) Nasopharyngeal Swab     Status: Abnormal   Collection Time: 06/25/21  8:20 AM   Specimen: Nasopharyngeal Swab; Nasopharyngeal(NP) swabs in vial transport medium  Result Value Ref Range Status   SARS Coronavirus 2 by RT PCR POSITIVE (A) NEGATIVE Final    Comment: RESULT CALLED TO, READ BACK BY AND VERIFIED WITH: RN Gaspar Skeeters, [id number] (NOTE) SARS-CoV-2 target nucleic acids are DETECTED.  The SARS-CoV-2 RNA is generally detectable in upper respiratory specimens during the acute phase of infection. Positive results are indicative of the presence of the identified virus, but do not rule out bacterial infection or co-infection with other pathogens not detected by the test. Clinical correlation with patient history and other diagnostic information is necessary to determine patient infection status. The expected result is Negative.  Fact Sheet for Patients: EntrepreneurPulse.com.au  Fact Sheet for Healthcare Providers: IncredibleEmployment.be  This test is not yet approved or cleared by the [place] FDA and  has been authorized for detection and/or diagnosis of SARS-CoV-2 by FDA under an Emergency Use Authorization (EUA).  This EUA will remain in effect (meaning this test can be used)  for the duration of  the COVID-19 declaration under Section 564(b)(1) of the Act, 21 U.S.C. section 360bbb-3(b)(1), unless the authorization is terminated or revoked sooner.     Influenza A by PCR NEGATIVE NEGATIVE Final   Influenza B by PCR NEGATIVE NEGATIVE Final    Comment: (NOTE) The Xpert Xpress SARS-CoV-2/FLU/RSV plus assay is intended as an aid in the  diagnosis of influenza from Nasopharyngeal swab specimens and should not be used as a sole basis for treatment. Nasal washings and aspirates are unacceptable for Xpert Xpress SARS-CoV-2/FLU/RSV testing.  Fact Sheet for Patients: EntrepreneurPulse.com.au  Fact Sheet for Healthcare Providers: IncredibleEmployment.be  This test is not yet approved or cleared by the [place] FDA and has been authorized for detection and/or diagnosis of SARS-CoV-2 by FDA under an Emergency Use Authorization (EUA). This EUA will remain in effect (meaning this test can be used) for the duration of the COVID-19 declaration under Section 564(b)(1) of the Act, 21 U.S.C. section 360bbb-3(b)(1), unless the authorization is terminated or revoked.  Performed at [facility], [place] [address] [place], [place] [address]   Culture, blood (routine x 2)     Status: None (Preliminary result)   Collection Time: 06/25/21  2:47 PM   Specimen: BLOOD LEFT FOREARM  Result Value Ref Range Status   Specimen Description BLOOD LEFT FOREARM  Final   Special Requests   Final    BOTTLES DRAWN AEROBIC AND ANAEROBIC Blood Culture results may not be optimal due to an inadequate volume of blood received in culture bottles   Culture   Final    NO GROWTH 3 DAYS Performed at [facility], [address], [place], [place] [address]    Report Status PENDING  Incomplete  Culture, blood (routine x 2)     Status: None (Preliminary result)   Collection Time: 06/25/21  4:18 PM   Specimen: BLOOD  Result Value Ref Range Status   Specimen Description BLOOD BLOOD LEFT FOREARM  Final   Special Requests   Final    BOTTLES DRAWN AEROBIC AND ANAEROBIC Blood Culture results may not be optimal due to an inadequate volume of blood received in culture bottles   Culture   Final    NO GROWTH 3 DAYS Performed at [facility], [place] [address] [place], [place] [address]    Report Status PENDING   Incomplete     Labs: BNP (last 3 results) Recent Labs    06/25/21 0821  BNP 84.1   Basic Metabolic Panel: Recent Labs  Lab 06/25/21 0821 06/25/21 0914 06/26/21 0437 06/27/21 0224 06/28/21 0729 06/29/21 0316  NA 136 137 139 138 140 138  K 3.6 3.4* 2.7* 2.8* 3.7 3.5  CL 100  --  103 101 109 107  CO2 23  --  _0 21*  GLUCOSE 149*  --  133* 94 89 90  BUN 8  --  _1 CREATININE 0.54  --  0.54 0.68 0.54 0.44  CALCIUM 9.3  --  8.6* 7.8* 8.3* 8.5*  MG  --   --  1.8 1.5* 1.9 1.8  PHOS  --   --  2.5 3.4 2.3* 2.8   Liver Function Tests: Recent Labs  Lab 06/25/21 1618 06/26/21 0437 06/27/21 0224 06/28/21 0729 06/29/21 0316  AST 31 35 _2 ALT _3 ALKPHOS 89 86 62 64 67  BILITOT 0.8 0.7 0.5 0.3 0.4  PROT 6.2* 6.2* 5.4* 5.5* 5.4*  ALBUMIN 3.0* 2.9* 2.6* 2.6* 2.7*   No results for input(s): LIPASE, AMYLASE in the last 168 hours. No results for input(s): AMMONIA in the last 168 hours. CBC: Recent Labs  Lab 06/25/21 0821 06/25/21 0914 06/26/21 0437 06/27/21 0224 06/28/21 0729 06/29/21 0316  WBC 11.0*  --  9.5 8.4 5.6 5.0  NEUTROABS 9.0*  --  7.3 5.6 2.9 2.8  HGB 12.8 12.6 12.3 10.0* 9.7* 10.0*  HCT 38.6 37.0 35.5* 29.8* 29.6* 30.1*  MCV 94.8  --  91.0 92.8 94.0 92.9  PLT 170  --  157 155 155 166   Cardiac Enzymes: No results for input(s): CKTOTAL, CKMB, CKMBINDEX, TROPONINI in the last 168 hours. BNP: Invalid input(s): POCBNP CBG: No results for input(s): GLUCAP in the last 168 hours. D-Dimer Recent Labs    06/28/21 0729 06/29/21 0316  DDIMER 0.53* 0.95*   Hgb A1c No results for input(s): HGBA1C in the last 72 hours. Lipid Profile No results for input(s): CHOL, HDL, LDLCALC, TRIG, CHOLHDL, LDLDIRECT in the last 72 hours. Thyroid function studies No results for input(s): TSH, T4TOTAL, T3FREE, THYROIDAB in the last 72 hours.  Invalid input(s): FREET3 Anemia work up Recent Labs    06/28/21 0729 06/29/21 0316  FERRITIN  164 110   Urinalysis    Component Value Date/Time   COLORURINE AMBER (A) 06/26/2021 0700   APPEARANCEUR CLOUDY (A) 06/26/2021 0700   APPEARANCEUR Cloudy (A) 02/04/2014 1313   LABSPEC 1.035 (H) 06/26/2021 0700   PHURINE 6.0 06/26/2021 0700   GLUCOSEU NEGATIVE 06/26/2021 0700   HGBUR NEGATIVE 06/26/2021 0700   BILIRUBINUR NEGATIVE 06/26/2021 0700  BILIRUBINUR Negative 02/04/2014 1313   KETONESUR 5 (A) 06/26/2021 0700   PROTEINUR 100 (A) 06/26/2021 0700   UROBILINOGEN 2.0 (H) 09/28/2011 1846   NITRITE NEGATIVE 06/26/2021 0700   LEUKOCYTESUR SMALL (A) 06/26/2021 0700   Sepsis Labs Invalid input(s): PROCALCITONIN,  WBC,  LACTICIDVEN Microbiology Recent Results (from the past 240 hour(s))  Resp Panel by RT-PCR (Flu A&B, Covid) Nasopharyngeal Swab     Status: Abnormal   Collection Time: 06/25/21  8:20 AM   Specimen: Nasopharyngeal Swab; Nasopharyngeal(NP) swabs in vial transport medium  Result Value Ref Range Status   SARS Coronavirus 2 by RT PCR POSITIVE (A) NEGATIVE Final    Comment: RESULT CALLED TO, READ BACK BY AND VERIFIED WITH: RN Gaspar Skeeters, [id number] (NOTE) SARS-CoV-2 target nucleic acids are DETECTED.  The SARS-CoV-2 RNA is generally detectable in upper respiratory specimens during the acute phase of infection. Positive results are indicative of the presence of the identified virus, but do not rule out bacterial infection or co-infection with other pathogens not detected by the test. Clinical correlation with patient history and other diagnostic information is necessary to determine patient infection status. The expected result is Negative.  Fact Sheet for Patients: EntrepreneurPulse.com.au  Fact Sheet for Healthcare Providers: IncredibleEmployment.be  This test is not yet approved or cleared by the [place] FDA and  has been authorized for detection and/or diagnosis of SARS-CoV-2 by FDA under an Emergency Use  Authorization (EUA).  This EUA will remain in effect (meaning this test can be used)  for the duration of  the COVID-19 declaration under Section 564(b)(1) of the Act, 21 U.S.C. section 360bbb-3(b)(1), unless the authorization is terminated or revoked sooner.     Influenza A by PCR NEGATIVE NEGATIVE Final   Influenza B by PCR NEGATIVE NEGATIVE Final    Comment: (NOTE) The Xpert Xpress SARS-CoV-2/FLU/RSV plus assay is intended as an aid in the diagnosis of influenza from Nasopharyngeal swab specimens and should not be used as a sole basis for treatment. Nasal washings and aspirates are unacceptable for Xpert Xpress SARS-CoV-2/FLU/RSV testing.  Fact Sheet for Patients: EntrepreneurPulse.com.au  Fact Sheet for Healthcare Providers: IncredibleEmployment.be  This test is not yet approved or cleared by the [place] FDA and has been authorized for detection and/or diagnosis of SARS-CoV-2 by FDA under an Emergency Use Authorization (EUA). This EUA will remain in effect (meaning this test can be used) for the duration of the COVID-19 declaration under Section 564(b)(1) of the Act, 21 U.S.C. section 360bbb-3(b)(1), unless the authorization is terminated or revoked.  Performed at [facility], [place] [address], [place], [place] [address]   Culture, blood (routine x 2)     Status: None (Preliminary result)   Collection Time: 06/25/21  2:47 PM   Specimen: BLOOD LEFT FOREARM  Result Value Ref Range Status   Specimen Description BLOOD LEFT FOREARM  Final   Special Requests   Final    BOTTLES DRAWN AEROBIC AND ANAEROBIC Blood Culture results may not be optimal due to an inadequate volume of blood received in culture bottles   Culture   Final    NO GROWTH 3 DAYS Performed at [facility], [place] [address], [place], [place] [address]    Report Status PENDING  Incomplete  Culture, blood (routine x 2)     Status: None (Preliminary result)    Collection Time: 06/25/21  4:18 PM   Specimen: BLOOD  Result Value Ref Range Status   Specimen Description BLOOD BLOOD LEFT  FOREARM  Final   Special Requests   Final    BOTTLES DRAWN AEROBIC AND ANAEROBIC Blood Culture results may not be optimal due to an inadequate volume of blood received in culture bottles   Culture   Final    NO GROWTH 3 DAYS Performed at [facility], [place] [address], [place], [place] [address]    Report Status PENDING  Incomplete     Time coordinating discharge: Over 30 minutes  SIGNED:   Darliss Cheney, MD  [facility] 06/29/2021, 7:51 AM  If 7PM-7AM, please contact night-coverage [email or web address]

## 2021-06-29 NOTE — TOC Transition Note (Signed)
Transition of Care [facility]) - CM/SW Discharge Note   Patient Details  Name: [name] MRN: [id number] Date of Birth: Sep 07, 1946  Transition of Care [facility]) CM/SW Contact:  Zenon Mayo, RN Phone Number: 06/29/2021, 8:39 AM   Clinical Narrative:    NCM spoke with son,  he states he takes her to [facility], she has a w/chair, bsc, walker  and a prosthetic at home.  She has an aide with [facility] from 9 am to 11 am Mon- Friday who helps her with bathing, dressing,ADL's and house cleaning.  He states he will be able to transport her home today via car. NCM informed him the Staff RN will let him know when to come to pick her up.     Final next level of care: Home/Self Care Barriers to Discharge: No Barriers Identified   Patient Goals and CMS Choice Patient states their goals for this hospitalization and ongoing recovery are:: return home with son   Choice offered to / list presented to : NA  Discharge Placement                       Discharge Plan and Services                  DME Agency: NA       HH Arranged: NA          Social Determinants of Health (SDOH) Interventions     Readmission Risk Interventions No flowsheet data found.

## 2021-06-30 LAB — CULTURE, BLOOD (ROUTINE X 2)
Culture: NO GROWTH
Culture: NO GROWTH

## 2021-07-12 ENCOUNTER — Other Ambulatory Visit: Payer: Self-pay

## 2021-07-12 ENCOUNTER — Other Ambulatory Visit: Payer: Self-pay | Admitting: Neurology

## 2021-07-12 ENCOUNTER — Other Ambulatory Visit: Payer: Medicare HMO | Admitting: Hospice

## 2021-07-12 DIAGNOSIS — N3000 Acute cystitis without hematuria: Secondary | ICD-10-CM

## 2021-07-12 DIAGNOSIS — G35 Multiple sclerosis: Secondary | ICD-10-CM | POA: Diagnosis not present

## 2021-07-12 DIAGNOSIS — R410 Disorientation, unspecified: Secondary | ICD-10-CM | POA: Diagnosis not present

## 2021-07-12 DIAGNOSIS — U071 COVID-19: Secondary | ICD-10-CM

## 2021-07-12 DIAGNOSIS — Z79899 Other long term (current) drug therapy: Secondary | ICD-10-CM | POA: Diagnosis not present

## 2021-07-12 DIAGNOSIS — Z515 Encounter for palliative care: Secondary | ICD-10-CM

## 2021-07-12 NOTE — Progress Notes (Signed)
[place] Consult Note Telephone: [phone]  Fax: [phone]  PATIENT NAME: [name] DOB: 09/08/1946 MRN: [id number]  PRIMARY CARE PROVIDER:   Glenis Smoker, MD Meagan Smoker, MD [place],  [place] [address]  REFERRING PROVIDER: Glenis Smoker, MD Meagan Smoker, [place],  [place] [address]  RESPONSIBLE PARTY:  [place]     Name Relation Home Work [place] Son [phone]  [phone]      TELEHEALTH VISIT STATEMENT Due to the COVID-19 crisis, this visit was done via telemedicine and it was initiated and consent by this patient and or family. Video-audio (telehealth) contact was unable to be done due to technical barriers from the patient's side.   Visit is to build trust and highlight Palliative Medicine as specialized medical care for people living with serious illness, aimed at facilitating better quality of life through symptoms relief, assisting with advance care planning and complex medical decision making. This is a follow up visit.  RECOMMENDATIONS/PLAN:   Advance Care Planning/Code Status: Following discharge from recent hospitalization, NP discussed the implications and ramifications of CODE STATUS.  Patient/[name] affirmed that patient remains a full code.  Goals of Care: Goals of care include to maximize quality of life and symptom management.  Visit consisted of counseling and education dealing with the complex and emotionally intense issues of symptom management and palliative care in the setting of serious and potentially life-threatening illness. Palliative care team will continue to support patient, patient's family, and medical team.  I spent 16  minutes providing this initial consultation. More than 50% of the time in this consultation was spent on counseling patient and coordinating  communication. -------------------------------------------------------------------------------------------------------------------------------------- Symptom management/Plan:  COVID- 19 Virus infection:hospitalized 9/16-/20/2022.  Acute respiratory failure with hypoxia related to COVID 19 virus infection is resolved.  Patient tested negative 07/10/2021. Follow up with PCP today UTI: Completed antibiotics. Going with urine sample for visit with PCP today.  Multiple sclerosis and trigeminal neuralgia: Followed by her neurologist. Continue Lamictal as ordered.  Patient continues to be bedbound, requiring extensive assistance in activities of daily living.  Encouraged ongoing supportive care. Memory difficulties: Continue Aricept as ordered.  Follow up: Palliative care will continue to follow for complex medical decision making, advance care planning, and clarification of goals. Return 6 weeks or prn. Encouraged to call provider sooner with any concerns.  CHIEF COMPLAINT: Palliative follow up  HISTORY OF PRESENT ILLNESS:  [name] a 75 y.o. female with multiple medical problems including recent acute respiratory failure related to COVID-19 virus infection.  Son reports that patient was lethargic and foaming in her mouth and he suspected it may be COVID since he had COVID himself.  He was taken to the ED and admitted 9/16 - 06/29/2021.  Patient is vaccinated and boosted x2.  She denies pain/discomfort, no respiratory distress.  History of neuropathic pain, hypertension,  multiple sclerosis and trigeminal neuralgia, hypertension, right BKA. No concerns/complaints from [name]. History obtained from review of EMR, discussion with primary team, family and/or patient. Records reviewed and summarized above. All 10 point systems reviewed and are negative except as documented in history of present illness above  Review and summarization of Epic records shows history from other than patient.   Palliative Care  was asked to follow this patient o help address complex decision making in the context of advance care planning and goals of care clarification.  PERTINENT MEDICATIONS:  Outpatient Encounter Medications as of 07/12/2021  Medication Sig   APPLE CIDER VINEGAR PO Take 450 mg by mouth daily.   aspirin EC 81 MG tablet Take 1 tablet (81 mg total) by mouth daily. Swallow whole.   atorvastatin (LIPITOR) 40 MG tablet Take 40 mg by mouth at bedtime.   baclofen (LIORESAL) 10 MG tablet Take 1 tablet (10 mg total) by mouth 4 (four) times daily.   buPROPion (WELLBUTRIN XL) 300 MG 24 hr tablet Take 300 mg by mouth daily.   carbamazepine (TEGRETOL) 200 MG tablet TAKE 1 TABLET BY MOUTH 3 TIMES A DAY (Patient taking differently: Take 200 mg by mouth 3 (three) times daily. TAKE 1 TABLET BY MOUTH 3 TIMES A DAY)   carvedilol (COREG) 3.125 MG tablet Take 1 tablet (3.125 mg total) by mouth 2 (two) times daily with a meal.   Cholecalciferol (VITAMIN D-3 PO) Take 1 capsule by mouth daily.   CRANBERRY PO Take 1 tablet by mouth daily.   Diaper Rash Products (DESITIN CLEAR EX) Apply 1 application topically daily as needed (for skin protection).   docusate sodium (COLACE) 100 MG capsule Take 1 capsule (100 mg total) by mouth 2 (two) times daily. (Patient taking differently: Take 100 mg by mouth See admin instructions. Take 100 mg by mouth three to four times a week)   donepezil (ARICEPT) 5 MG tablet Take 1 tablet (5 mg total) by mouth daily.   hydrochlorothiazide (HYDRODIURIL) 25 MG tablet Take 25 mg by mouth at bedtime.   Interferon Beta-1a (AVONEX PEN) 30 MCG/0.5ML AJKT INJECT 30 MCG INTRAMUSCULARLY ONCE WEEKLY (Patient taking differently: every Wednesday. INJECT 30 MCG INTRAMUSCULARLY ONCE WEEKLY)   KLOR-CON M20 20 MEQ tablet Take 20 mEq by mouth 3 (three) times daily.   lamoTRIgine (LAMICTAL) 25 MG tablet Take 25 mg by mouth at bedtime.   losartan (COZAAR) 100 MG tablet Take 100 mg by mouth daily in the afternoon.    Multiple Vitamins-Minerals (MULTIVITAMIN GUMMIES ADULT) CHEW Chew 2 tablets by mouth every morning.   NON FORMULARY Take 1 tablet by mouth See admin instructions. Super Greens tablets- Take 1 tablet by mouth once a day   Omega-3 Fatty Acids (FISH OIL) 600 MG CAPS Take 1,200 mg by mouth daily.   pregabalin (LYRICA) 100 MG capsule Take 1 capsule (100 mg total) by mouth 2 (two) times daily. (Patient taking differently: Take 100 mg by mouth in the morning and at bedtime.)   Probiotic Product (DIGESTIVE ADV PREBIOT+PROBIOT) CHEW Chew 1 tablet by mouth daily.   sertraline (ZOLOFT) 50 MG tablet Take 75 mg by mouth daily.   Skin Protectants, Misc. (WHITE PETROLATUM-ZINC OXIDE) cream Apply topically as needed. (Patient taking differently: Apply 1 application topically daily as needed for dry skin or wound care.)   SUPER B COMPLEX/C PO Take 1 tablet by mouth daily.   traMADol (ULTRAM) 50 MG tablet TAKE 1 TABLET (50 MG TOTAL) BY MOUTH EVERY 6 (SIX) HOURS AS NEEDED FOR MODERATE PAIN (Patient taking differently: Take 25-50 mg by mouth every 6 (six) hours as needed for moderate pain.)   TURMERIC PO Take 450 mg by mouth daily.   Vitamins A & D (VITAMIN A & D) ointment Apply 1 application topically daily as needed for dry skin (or wound care).   No facility-administered encounter medications on file as of 07/12/2021.    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 Range 06/29/2021 03:16  Sodium Latest Ref Range: 135 - 145 mmol/L 138  Potassium Latest Ref Range: 3.5 - 5.1 mmol/L 3.5  Chloride Latest Ref Range: 98 - 111 mmol/L 107  CO2 Latest Ref Range: 22 - 32 mmol/L 21 (L)  Glucose Latest Ref Range: 70 - 99 mg/dL 90  BUN Latest Ref Range: 8 - 23 mg/dL 15  Creatinine Latest Ref Range: 0.44 - 1.00 mg/dL 0.44  Calcium Latest Ref Range: 8.9 - 10.3 mg/dL 8.5 (L)  Anion gap Latest Ref Range: 5 - 15  10  Phosphorus Latest Ref Range: 2.5 - 4.6 mg/dL 2.8  Magnesium Latest Ref Range: 1.7 - 2.4 mg/dL 1.8  Alkaline Phosphatase Latest Ref Range: 38 - 126 U/L 67  Albumin Latest Ref Range: 3.5 - 5.0 g/dL 2.7 (L)  AST Latest Ref Range: 15 - 41 U/L 23  ALT Latest Ref Range: 0 - 44 U/L 17  Total Protein Latest Ref Range: 6.5 - 8.1 g/dL 5.4 (L)  Total Bilirubin Latest Ref Range: 0.3 - 1.2 mg/dL 0.4  GFR, Estimated Latest Ref Range: >60 mL/min >60    ALLERGIES:  Allergies  Allergen Reactions   Prednisone Other (See Comments)    Caused thrush- will need something to reverse this   Shellfish Allergy Hives        Wound Dressing Adhesive     Splits the skin      Thank you for the opportunity to participate in the care of DAURICE OVANDO Please call our office at [phone] if we can be of additional assistance.  Note: Portions of this note were generated with [employer]. Dictation errors may occur despite best attempts at proofreading.  Teodoro Spray, NP

## 2021-07-21 ENCOUNTER — Other Ambulatory Visit: Payer: Self-pay | Admitting: Neurology

## 2021-07-30 DIAGNOSIS — F03911 Unspecified dementia, unspecified severity, with agitation: Secondary | ICD-10-CM | POA: Diagnosis not present

## 2021-07-30 DIAGNOSIS — F0391 Unspecified dementia with behavioral disturbance: Secondary | ICD-10-CM | POA: Diagnosis not present

## 2021-07-30 DIAGNOSIS — R82998 Other abnormal findings in urine: Secondary | ICD-10-CM | POA: Diagnosis not present

## 2021-07-30 DIAGNOSIS — F0393 Unspecified dementia, unspecified severity, with mood disturbance: Secondary | ICD-10-CM | POA: Diagnosis not present

## 2021-09-08 ENCOUNTER — Other Ambulatory Visit: Payer: Self-pay | Admitting: Neurology

## 2021-09-08 ENCOUNTER — Telehealth: Payer: Self-pay | Admitting: Neurology

## 2021-09-08 NOTE — Telephone Encounter (Signed)
I reviewed patient's last office note when she saw Judson Roch in July.  She was only taking tramadol rarely.  I feel reluctant to renew this prescription as overmedication was discussed at the time and symptoms of drowsiness.  I see it on her med list of multiple potentially sedating medications and medications to address her pain.  Please clarify how much tramadol she has been utilizing.  We may be able to give her a limited prescription.

## 2021-09-09 NOTE — Telephone Encounter (Signed)
I will refill her tramadol for 30 pills with no refills, should last her at least till next appointment at [facility].  I recommend that her medication regimen be reviewed critically.

## 2021-09-09 NOTE — Telephone Encounter (Signed)
Called patient's son, [name], on [place]. He is her caregiver. I informed him that the work in [place] reviewed request for Tramadol refill. I asked how often she takes it. He stated she takes 1/2 tramadol about 4 x a week for pain. She now has 1 1/2 tablets left. Her last Rx was in Aug for #30 tablets. I advised will let Dr Rexene Alberts know he'd like a refill. [name] verbalized understanding, appreciation.

## 2021-09-27 ENCOUNTER — Other Ambulatory Visit: Payer: Medicare HMO | Admitting: Hospice

## 2021-09-27 ENCOUNTER — Other Ambulatory Visit: Payer: Self-pay

## 2021-09-27 DIAGNOSIS — G35 Multiple sclerosis: Secondary | ICD-10-CM

## 2021-09-27 DIAGNOSIS — R413 Other amnesia: Secondary | ICD-10-CM

## 2021-09-27 DIAGNOSIS — Z515 Encounter for palliative care: Secondary | ICD-10-CM

## 2021-09-27 NOTE — Progress Notes (Signed)
[facility] Consult Note Telephone: [phone]  Fax: [phone]  PATIENT NAME: [name] DOB: 03-15-1946 MRN: [id number]  PRIMARY CARE PROVIDER:   Glenis Smoker, MD Glenis Smoker, MD [place],  [place] [address]  REFERRING PROVIDER: Glenis Smoker, MD Glenis Smoker, MD [place],  [place] [address]  RESPONSIBLE PARTY:  [place]     Name Relation Home Work [place] Son [phone]  [phone]      TELEHEALTH VISIT STATEMENT Due to the COVID-19 crisis, this visit was done via telemedicine and it was initiated and consent by this patient and or family. Video-audio (telehealth) contact was unable to be done due to technical barriers from the patients side.   Visit is to build trust and highlight Palliative Medicine as specialized medical care for people living with serious illness, aimed at facilitating better quality of life through symptoms relief, assisting with advance care planning and complex medical decision making. [name] is present with patient during visit. This is a follow up visit.  RECOMMENDATIONS/PLAN:   Advance Care Planning/Code Status:  Patient is a Full code.  Goals of Care: Goals of care include to maximize quality of life and symptom management.  Visit consisted of counseling and education dealing with the complex and emotionally intense issues of symptom management and palliative care in the setting of serious and potentially life-threatening illness. Palliative care team will continue to support patient, patient's family, and medical team.  Symptom management/Plan:  Multiple sclerosis and trigeminal neuralgia: Continue Baclofen, Lyrica, Lamictal as ordered.  Followed by her neurologist. Patient continues to be bedbound, requiring extensive assistance in activities of daily living. Continue ongoing supportive  care.  Memory difficulties:  Encourage reminiscence, word search/puzzles, cueing for recollection.  Promote calm approach and engaging environment. Aricept was recently discontinued by PCP. Follow up: Palliative care will continue to follow for complex medical decision making, advance care planning, and clarification of goals. Return 6 weeks or prn. Encouraged to call provider sooner with any concerns.  CHIEF COMPLAINT: Palliative follow up  HISTORY OF PRESENT ILLNESS:  [name] a 75 y.o. female with multiple medical problems including multiple sclerosis and trigeminal neuralgia for which she is followed by her neurologist.  History of COVID-19 viral infection, neuropathic pain, hypertension,  multiple sclerosis and trigeminal neuralgia, hypertension, right BKA. No concerns/complaints from [place].  Patient denies pain/discomfort, no respiratory distress.  History obtained from review of EMR, discussion with primary team, family and/or patient. Records reviewed and summarized above. All 10 point systems reviewed and are negative except as documented in history of present illness above  Review and summarization of Epic records shows history from other than patient.   Palliative Care was asked to follow this patient o help address complex decision making in the context of advance care planning and goals of care clarification.   PERTINENT MEDICATIONS:  Outpatient Encounter Medications as of 09/27/2021  Medication Sig   traMADol (ULTRAM) 50 MG tablet 1/2 tablet prn daily.   APPLE CIDER VINEGAR PO Take 450 mg by mouth daily.   atorvastatin (LIPITOR) 40 MG tablet Take 40 mg by mouth at bedtime.   baclofen (LIORESAL) 10 MG tablet Take 1 tablet (10 mg total) by mouth 4 (four) times daily.   buPROPion (WELLBUTRIN XL) 300 MG 24 hr tablet Take 300 mg by mouth daily.   carbamazepine (TEGRETOL) 200 MG tablet TAKE 1 TABLET BY MOUTH  3 TIMES A DAY (Patient taking differently: Take 200 mg by mouth 3 (three)  times daily. TAKE 1 TABLET BY MOUTH 3 TIMES A DAY)   carvedilol (COREG) 3.125 MG tablet Take 1 tablet (3.125 mg total) by mouth 2 (two) times daily with a meal.   Cholecalciferol (VITAMIN D-3 PO) Take 1 capsule by mouth daily.   CRANBERRY PO Take 1 tablet by mouth daily.   Diaper Rash Products (DESITIN CLEAR EX) Apply 1 application topically daily as needed (for skin protection).   docusate sodium (COLACE) 100 MG capsule Take 1 capsule (100 mg total) by mouth 2 (two) times daily. (Patient taking differently: Take 100 mg by mouth See admin instructions. Take 100 mg by mouth three to four times a week)   donepezil (ARICEPT) 5 MG tablet Take 1 tablet (5 mg total) by mouth daily.   hydrochlorothiazide (HYDRODIURIL) 25 MG tablet Take 25 mg by mouth at bedtime.   Interferon Beta-1a (AVONEX PEN) 30 MCG/0.5ML AJKT INJECT 30 MCG INTRAMUSCULARLY ONCE WEEKLY (Patient taking differently: every Wednesday. INJECT 30 MCG INTRAMUSCULARLY ONCE WEEKLY)   KLOR-CON M20 20 MEQ tablet Take 20 mEq by mouth 3 (three) times daily.   lamoTRIgine (LAMICTAL) 25 MG tablet Take 25 mg by mouth at bedtime.   losartan (COZAAR) 100 MG tablet Take 100 mg by mouth daily in the afternoon.   Multiple Vitamins-Minerals (MULTIVITAMIN GUMMIES ADULT) CHEW Chew 2 tablets by mouth every morning.   NON FORMULARY Take 1 tablet by mouth See admin instructions. Super Greens tablets- Take 1 tablet by mouth once a day   Omega-3 Fatty Acids (FISH OIL) 600 MG CAPS Take 1,200 mg by mouth daily.   pregabalin (LYRICA) 100 MG capsule TAKE 1 CAPSULE BY MOUTH TWICE A DAY   Probiotic Product (DIGESTIVE ADV PREBIOT+PROBIOT) CHEW Chew 1 tablet by mouth daily.   sertraline (ZOLOFT) 50 MG tablet Take 75 mg by mouth daily.   Skin Protectants, Misc. (WHITE PETROLATUM-ZINC OXIDE) cream Apply topically as needed. (Patient taking differently: Apply 1 application topically daily as needed for dry skin or wound care.)   SUPER B COMPLEX/C PO Take 1 tablet by mouth  daily.   TURMERIC PO Take 450 mg by mouth daily.   Vitamins A & D (VITAMIN A & D) ointment Apply 1 application topically daily as needed for dry skin (or wound care).   No facility-administered encounter medications on file as of 09/27/2021.    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 More than 50% of the time in this consultation was spent on care coordination  Thank you for the opportunity to participate in the care of ANDREIA GANDOLFI Please call our office at [phone] if we can be of additional assistance.  Note: Portions of this note were generated with [employer]. Dictation errors may occur despite best attempts at proofreading.  Teodoro Spray, NP

## 2021-10-07 ENCOUNTER — Inpatient Hospital Stay (HOSPITAL_COMMUNITY)
Admission: EM | Admit: 2021-10-07 | Discharge: 2021-10-11 | DRG: 177 | Disposition: A | Discharge status: Home Health | Payer: Medicare HMO | Attending: Internal Medicine | Admitting: Internal Medicine

## 2021-10-07 ENCOUNTER — Emergency Department (HOSPITAL_COMMUNITY): Payer: Medicare HMO

## 2021-10-07 ENCOUNTER — Encounter (HOSPITAL_COMMUNITY): Payer: Self-pay | Admitting: Family Medicine

## 2021-10-07 DIAGNOSIS — F32A Depression, unspecified: Secondary | ICD-10-CM | POA: Diagnosis present

## 2021-10-07 DIAGNOSIS — Z79899 Other long term (current) drug therapy: Secondary | ICD-10-CM

## 2021-10-07 DIAGNOSIS — E876 Hypokalemia: Secondary | ICD-10-CM | POA: Diagnosis present

## 2021-10-07 DIAGNOSIS — L89152 Pressure ulcer of sacral region, stage 2: Secondary | ICD-10-CM | POA: Diagnosis present

## 2021-10-07 DIAGNOSIS — U071 COVID-19: Principal | ICD-10-CM | POA: Diagnosis present

## 2021-10-07 DIAGNOSIS — G500 Trigeminal neuralgia: Secondary | ICD-10-CM | POA: Diagnosis present

## 2021-10-07 DIAGNOSIS — F09 Unspecified mental disorder due to known physiological condition: Secondary | ICD-10-CM | POA: Diagnosis present

## 2021-10-07 DIAGNOSIS — Z79891 Long term (current) use of opiate analgesic: Secondary | ICD-10-CM | POA: Diagnosis not present

## 2021-10-07 DIAGNOSIS — F0390 Unspecified dementia without behavioral disturbance: Secondary | ICD-10-CM | POA: Diagnosis not present

## 2021-10-07 DIAGNOSIS — Z8249 Family history of ischemic heart disease and other diseases of the circulatory system: Secondary | ICD-10-CM

## 2021-10-07 DIAGNOSIS — Z83438 Family history of other disorder of lipoprotein metabolism and other lipidemia: Secondary | ICD-10-CM

## 2021-10-07 DIAGNOSIS — G35 Multiple sclerosis: Secondary | ICD-10-CM | POA: Diagnosis not present

## 2021-10-07 DIAGNOSIS — Z89511 Acquired absence of right leg below knee: Secondary | ICD-10-CM | POA: Diagnosis not present

## 2021-10-07 DIAGNOSIS — Z820 Family history of epilepsy and other diseases of the nervous system: Secondary | ICD-10-CM | POA: Diagnosis not present

## 2021-10-07 DIAGNOSIS — E785 Hyperlipidemia, unspecified: Secondary | ICD-10-CM | POA: Diagnosis present

## 2021-10-07 DIAGNOSIS — G9341 Metabolic encephalopathy: Secondary | ICD-10-CM | POA: Diagnosis present

## 2021-10-07 DIAGNOSIS — Z9109 Other allergy status, other than to drugs and biological substances: Secondary | ICD-10-CM | POA: Diagnosis not present

## 2021-10-07 DIAGNOSIS — Z87440 Personal history of urinary (tract) infections: Secondary | ICD-10-CM

## 2021-10-07 DIAGNOSIS — I10 Essential (primary) hypertension: Secondary | ICD-10-CM | POA: Diagnosis present

## 2021-10-07 DIAGNOSIS — Z7401 Bed confinement status: Secondary | ICD-10-CM

## 2021-10-07 DIAGNOSIS — Z888 Allergy status to other drugs, medicaments and biological substances status: Secondary | ICD-10-CM | POA: Diagnosis not present

## 2021-10-07 DIAGNOSIS — N319 Neuromuscular dysfunction of bladder, unspecified: Secondary | ICD-10-CM | POA: Diagnosis present

## 2021-10-07 DIAGNOSIS — I517 Cardiomegaly: Secondary | ICD-10-CM | POA: Diagnosis not present

## 2021-10-07 DIAGNOSIS — E860 Dehydration: Secondary | ICD-10-CM | POA: Diagnosis not present

## 2021-10-07 DIAGNOSIS — R0902 Hypoxemia: Secondary | ICD-10-CM | POA: Diagnosis not present

## 2021-10-07 DIAGNOSIS — R4182 Altered mental status, unspecified: Secondary | ICD-10-CM | POA: Diagnosis not present

## 2021-10-07 DIAGNOSIS — R4020 Unspecified coma: Secondary | ICD-10-CM | POA: Diagnosis not present

## 2021-10-07 DIAGNOSIS — R000 Tachycardia, unspecified: Secondary | ICD-10-CM | POA: Diagnosis not present

## 2021-10-07 DIAGNOSIS — Z91013 Allergy to seafood: Secondary | ICD-10-CM

## 2021-10-07 DIAGNOSIS — R404 Transient alteration of awareness: Secondary | ICD-10-CM | POA: Diagnosis not present

## 2021-10-07 LAB — COMPREHENSIVE METABOLIC PANEL
ALT: 17 U/L (ref 0–44)
AST: 27 U/L (ref 15–41)
Albumin: 3.7 g/dL (ref 3.5–5.0)
Alkaline Phosphatase: 116 U/L (ref 38–126)
Anion gap: 10 (ref 5–15)
BUN: 9 mg/dL (ref 8–23)
CO2: 23 mmol/L (ref 22–32)
Calcium: 9.3 mg/dL (ref 8.9–10.3)
Chloride: 106 mmol/L (ref 98–111)
Creatinine, Ser: 0.6 mg/dL (ref 0.44–1.00)
GFR, Estimated: >60 mL/min (ref >60)
Glucose, Bld: 117 mg/dL — ABNORMAL HIGH (ref 70–99)
Potassium: 3.3 mmol/L — ABNORMAL LOW (ref 3.5–5.1)
Sodium: 139 mmol/L (ref 135–145)
Total Bilirubin: 0.5 mg/dL (ref 0.3–1.2)
Total Protein: 7.2 g/dL (ref 6.5–8.1)

## 2021-10-07 LAB — URINALYSIS, ROUTINE W REFLEX MICROSCOPIC
Bilirubin Urine: NEGATIVE
Glucose, UA: NEGATIVE mg/dL
Hgb urine dipstick: NEGATIVE
Ketones, ur: NEGATIVE mg/dL
Leukocytes,Ua: NEGATIVE
Nitrite: NEGATIVE
Protein, ur: NEGATIVE mg/dL
Specific Gravity, Urine: 1.016 (ref 1.005–1.030)
pH: 5 (ref 5.0–8.0)

## 2021-10-07 LAB — CBC WITH DIFFERENTIAL/PLATELET
Abs Immature Granulocytes: 0.03 10*3/uL (ref 0.00–0.07)
Basophils Absolute: 0 10*3/uL (ref 0.0–0.1)
Basophils Relative: 0 %
Eosinophils Absolute: 0 10*3/uL (ref 0.0–0.5)
Eosinophils Relative: 0 %
HCT: 39.7 % (ref 36.0–46.0)
Hemoglobin: 12.7 g/dL (ref 12.0–15.0)
Immature Granulocytes: 0 %
Lymphocytes Relative: 11 %
Lymphs Abs: 1.1 10*3/uL (ref 0.7–4.0)
MCH: 30.4 pg (ref 26.0–34.0)
MCHC: 32 g/dL (ref 30.0–36.0)
MCV: 95 fL (ref 80.0–100.0)
Monocytes Absolute: 0.5 10*3/uL (ref 0.1–1.0)
Monocytes Relative: 5 %
Neutro Abs: 9.1 10*3/uL — ABNORMAL HIGH (ref 1.7–7.7)
Neutrophils Relative %: 84 %
Platelets: 234 10*3/uL (ref 150–400)
RBC: 4.18 MIL/uL (ref 3.87–5.11)
RDW: 13.4 % (ref 11.5–15.5)
WBC: 10.8 10*3/uL — ABNORMAL HIGH (ref 4.0–10.5)
nRBC: 0 % (ref 0.0–0.2)

## 2021-10-07 LAB — CBG MONITORING, ED: Glucose-Capillary: 121 mg/dL — ABNORMAL HIGH (ref 70–99)

## 2021-10-07 LAB — RESP PANEL BY RT-PCR (FLU A&B, COVID) ARPGX2
Influenza A by PCR: NEGATIVE
Influenza B by PCR: NEGATIVE
SARS Coronavirus 2 by RT PCR: POSITIVE — AB

## 2021-10-07 LAB — LACTIC ACID, PLASMA: Lactic Acid, Venous: 1.3 mmol/L (ref 0.5–1.9)

## 2021-10-07 MED ORDER — VANCOMYCIN HCL 1250 MG/250ML IV SOLN
1250.0000 mg | INTRAVENOUS | Status: DC
  Administered 2021-10-08: 22:00:00 1250 mg via INTRAVENOUS
  Filled 2021-10-07: qty 250

## 2021-10-07 MED ORDER — METRONIDAZOLE 500 MG/100ML IV SOLN
500.0000 mg | Freq: Once | INTRAVENOUS | Status: AC
  Administered 2021-10-07: 20:00:00 500 mg via INTRAVENOUS
  Filled 2021-10-07: qty 100

## 2021-10-07 MED ORDER — LACTATED RINGERS IV BOLUS (SEPSIS)
500.0000 mL | Freq: Once | INTRAVENOUS | Status: AC
  Administered 2021-10-07: 23:00:00 500 mL via INTRAVENOUS

## 2021-10-07 MED ORDER — SODIUM CHLORIDE 0.9 % IV SOLN
2.0000 g | Freq: Three times a day (TID) | INTRAVENOUS | Status: DC
  Administered 2021-10-08 – 2021-10-09 (×4): 2 g via INTRAVENOUS
  Filled 2021-10-07 (×4): qty 2

## 2021-10-07 MED ORDER — VANCOMYCIN HCL 1500 MG/300ML IV SOLN
1500.0000 mg | Freq: Once | INTRAVENOUS | Status: AC
  Administered 2021-10-07: 21:00:00 1500 mg via INTRAVENOUS
  Filled 2021-10-07: qty 300

## 2021-10-07 MED ORDER — MOLNUPIRAVIR EUA 200MG CAPSULE
4.0000 | ORAL_CAPSULE | Freq: Two times a day (BID) | ORAL | Status: DC
  Filled 2021-10-07: qty 4

## 2021-10-07 MED ORDER — SODIUM CHLORIDE 0.9 % IV SOLN
100.0000 mg | Freq: Every day | INTRAVENOUS | Status: AC
  Administered 2021-10-08 – 2021-10-11 (×4): 100 mg via INTRAVENOUS
  Filled 2021-10-07 (×5): qty 20
  Filled 2021-10-07: qty 100
  Filled 2021-10-07: qty 20

## 2021-10-07 MED ORDER — LACTATED RINGERS IV BOLUS (SEPSIS)
1000.0000 mL | Freq: Once | INTRAVENOUS | Status: AC
  Administered 2021-10-07: 20:00:00 1000 mL via INTRAVENOUS

## 2021-10-07 MED ORDER — SODIUM CHLORIDE 0.9 % IV SOLN
200.0000 mg | Freq: Once | INTRAVENOUS | Status: DC
  Filled 2021-10-07: qty 40

## 2021-10-07 MED ORDER — SODIUM CHLORIDE 0.9 % IV SOLN
100.0000 mg | Freq: Every day | INTRAVENOUS | Status: DC
  Filled 2021-10-07: qty 20

## 2021-10-07 MED ORDER — SODIUM CHLORIDE 0.9 % IV SOLN
200.0000 mg | Freq: Once | INTRAVENOUS | Status: AC
  Administered 2021-10-08: 01:00:00 200 mg via INTRAVENOUS
  Filled 2021-10-07: qty 40

## 2021-10-07 MED ORDER — ACETAMINOPHEN 650 MG RE SUPP
650.0000 mg | Freq: Once | RECTAL | Status: AC
  Administered 2021-10-07: 20:00:00 650 mg via RECTAL
  Filled 2021-10-07: qty 1

## 2021-10-07 MED ORDER — SODIUM CHLORIDE 0.9 % IV SOLN
2.0000 g | Freq: Once | INTRAVENOUS | Status: AC
  Administered 2021-10-07: 20:00:00 2 g via INTRAVENOUS
  Filled 2021-10-07: qty 2

## 2021-10-07 MED ORDER — LACTATED RINGERS IV SOLN: INTRAVENOUS | Status: AC

## 2021-10-07 MED ORDER — LACTATED RINGERS IV BOLUS (SEPSIS)
1000.0000 mL | Freq: Once | INTRAVENOUS | Status: AC
  Administered 2021-10-07: 22:00:00 1000 mL via INTRAVENOUS

## 2021-10-07 MED ORDER — NIRMATRELVIR/RITONAVIR (PAXLOVID)TABLET
3.0000 | ORAL_TABLET | Freq: Two times a day (BID) | ORAL | Status: DC
Start: 2021-10-07 — End: 2021-10-07

## 2021-10-07 NOTE — ED Triage Notes (Signed)
BIB [facility] from Home. Lives with son. Usually speaks in short sentences, not speaking today, responds to painful stimuli. 101.4 F temp. HX MS and frequent UTI.

## 2021-10-07 NOTE — H&P (Signed)
History and Physical    [name] WUJ:[id number] DOB: January 31, 1946 DOA: 10/07/2021  PCP: Glenis Smoker, MD   Patient coming from: Home  Chief Complaint: Decreased responsiveness, fever  HPI: [name] is a 75 y.o. female with medical history significant for Multiple sclerosis with right sided weakness, Dementia, HTN, neurolagia  who presents by EMS with altered mental status. Her son lives with her and cares for her. Son is at bedside.  At baseline she is bedbound and does not ambulate.  This morning when her son tried to give her her normal morning medications she would not swallow them and would chew them and would not look at him directly or answer questions verbally.  He states that she has had similar episodes like this when she had a UTI in the past and he thought that maybe she had another urinary tract infection.  The emergency room she was found to have a fever.  There is no report of any cough, shortness of breath, vomiting or diarrhea.  She was not given any medications at home for her symptoms.  Son states he has not noticed any drooping face.  She has not been talking and is not as interactive as she normally is.  She has chronic right-sided weakness and does not move her right side normally. Lives with her son.  No tobacco alcohol or illicit drug use. He had COVID June 25, 2021 that she was hospitalized for and treated at that time.  She fully recovered from that episode per son.  She is fully vaccinated and has had 2 booster shots for COVID  ED Course: [name] is found to have a fever and tachycardia with mild tachypnea in the emergency room.  She was worked up for sepsis.  UA was negative.  She is found to be COVID-positive.  WBC 10,800 hemoglobin 12.7 hematocrit 39.7 platelets 234,000, lactic acid 1.3, sodium 139 potassium 3.3 chloride 106 bicarb 23 glucose 117 creatinine 0.60 BUN 9, LFTs normal.  Hospitalist service asked to admit patient for further  management.  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 trigeminal neuralgia Right    left leg DVT Left    STRABISMUS SURGERY     Right eye     Social History  reports that she has never smoked. She has never used smokeless tobacco. She reports that she does not drink alcohol and does not use drugs.  Allergies  Allergen Reactions   Prednisone Other (See Comments)  Caused thrush- will need something to reverse this   Shellfish Allergy Hives        Wound Dressing Adhesive     Splits the skin    Family History  Problem Relation Age of Onset   Multiple sclerosis Sister    Cervical cancer Mother    Liver disease Father    Hyperlipidemia Brother    Pancreatitis Brother    Hypertension Other    Heart disease Other    Uterine cancer Other      Prior to Admission medications   Medication Sig Start Date End Date Taking? Authorizing Provider  traMADol (ULTRAM) 50 MG tablet 1/2 tablet prn daily. 09/09/21   Star Age, MD  APPLE CIDER VINEGAR PO Take 450 mg by mouth daily.    [provider]  atorvastatin (LIPITOR) 40 MG tablet Take  40 mg by mouth at bedtime.    [provider]  baclofen (LIORESAL) 10 MG tablet Take 1 tablet (10 mg total) by mouth 4 (four) times daily. 04/29/21   Suzzanne Cloud, NP  buPROPion (WELLBUTRIN XL) 300 MG 24 hr tablet Take 300 mg by mouth daily.    Deneise Lever, MD  carbamazepine (TEGRETOL) 200 MG tablet TAKE 1 TABLET BY MOUTH 3 TIMES A DAY Patient taking differently: Take 200 mg by mouth 3 (three) times daily. TAKE 1 TABLET BY MOUTH 3 TIMES A DAY 04/29/21   Suzzanne Cloud, NP  carvedilol (COREG) 3.125 MG tablet Take 1 tablet (3.125 mg total) by mouth 2 (two) times daily with a meal. 06/29/21 07/29/21  Darliss Cheney, MD  Cholecalciferol (VITAMIN D-3 PO) Take 1 capsule by mouth daily.    [provider]  CRANBERRY PO Take 1 tablet by mouth daily.    [provider]  Diaper Rash Products (DESITIN CLEAR EX) Apply 1 application topically daily as needed (for skin protection).    [provider]  docusate sodium (COLACE) 100 MG capsule Take 1 capsule (100 mg total) by mouth 2 (two) times daily. Patient taking differently: Take 100 mg by mouth See admin instructions. Take 100 mg by mouth three to four times a week 10/24/18   Cristal Ford, DO  donepezil (ARICEPT) 5 MG tablet Take 1 tablet (5 mg total) by mouth daily. 04/29/21   Suzzanne Cloud, NP  hydrochlorothiazide (HYDRODIURIL) 25 MG tablet Take 25 mg by mouth at bedtime. 12/29/18   [provider]  Interferon Beta-1a (AVONEX PEN) 30 MCG/0.5ML AJKT INJECT 30 MCG INTRAMUSCULARLY ONCE WEEKLY Patient taking differently: every Wednesday. INJECT 30 MCG INTRAMUSCULARLY ONCE WEEKLY 04/29/21   Suzzanne Cloud, NP  KLOR-CON M20 20 MEQ tablet Take 20 mEq by mouth 3 (three) times daily.    [provider]  lamoTRIgine (LAMICTAL) 25 MG tablet Take 25 mg by mouth at bedtime.    [provider]  losartan (COZAAR) 100 MG tablet Take 100 mg by mouth daily in the afternoon.    [provider]   Multiple Vitamins-Minerals (MULTIVITAMIN GUMMIES ADULT) CHEW Chew 2 tablets by mouth every morning.    [provider]  NON FORMULARY Take 1 tablet by mouth See admin instructions. Super Greens tablets- Take 1 tablet by mouth once a day    [provider]  Omega-3 Fatty Acids (FISH OIL) 600 MG CAPS Take 1,200 mg by mouth daily.    [provider]  pregabalin (LYRICA) 100 MG capsule TAKE 1 CAPSULE BY MOUTH TWICE A DAY 07/21/21   Margette Fast  K, MD  Probiotic Product (DIGESTIVE ADV PREBIOT+PROBIOT) CHEW Chew 1 tablet by mouth daily.    [provider]  sertraline (ZOLOFT) 50 MG tablet Take 75 mg by mouth daily.    [provider]  Skin Protectants, Misc. (WHITE PETROLATUM-ZINC OXIDE) cream Apply topically as needed. Patient taking differently: Apply 1 application topically daily as needed for dry skin or wound care. 04/06/17   Lysbeth Penner, FNP  SUPER B COMPLEX/C PO Take 1 tablet by mouth daily.    [provider]  TURMERIC PO Take 450 mg by mouth daily.    [provider]  Vitamins A & D (VITAMIN A & D) ointment Apply 1 application topically daily as needed for dry skin (or wound care).    [provider]    Physical Exam: Vitals:   10/07/21 2045 10/07/21 2100 10/07/21 2115 10/07/21 2130  BP: (!) 184/77 (!) 165/75 (!) 177/73 (!) 175/68  Pulse: (!) 114 (!) 112 (!) 116 (!) 112  Resp: 19 19 19  (!) 23  Temp:      TempSrc:      SpO2: 95% 96% 95% 96%    Constitutional: NAD, calm, comfortable Vitals:   10/07/21 2045 10/07/21 2100 10/07/21 2115 10/07/21 2130  BP: (!) 184/77 (!) 165/75 (!) 177/73 (!) 175/68  Pulse: (!) 114 (!) 112 (!) 116 (!) 112  Resp: 19 19 19  (!) 23  Temp:      TempSrc:      SpO2: 95% 96% 95% 96%   General: WDWN elderly female.  Somnolent but will open eyes to voice.  Sometimes follows with her eyes, at times stares straight ahead.  Eyes: PERRL, conjunctivae normal.  Sclera nonicteric HENT:   [place]/AT, external ears normal.  Nares patent without epistasis.  Mucous membranes are dry Neck: Soft, normal range of motion, supple, no masses, Trachea midline Respiratory: Equal but diminished bilaterally, no wheezing, no crackles. Normal respiratory effort. No accessory muscle use.  Cardiovascular: Regular rhythm, tachycardia, no murmurs / rubs / gallops. No extremity edema.  Abdomen: Soft, no tenderness, nondistended, no rebound or guarding.  No masses palpated. Bowel sounds normoactive Musculoskeletal: No cyanosis. No joint deformity upper and lower extremities. Right BKA.  Skin: Warm, dry, intact no rashes, lesions, ulcers. No induration. No purpura or petechiae Neurologic: Moves extremities spontaneously.  Bedbound.  Strength 3 out of 5 on left.  No tremor   Labs on Admission: I have personally reviewed following labs and imaging studies  CBC: Recent Labs  Lab 10/07/21 1947  WBC 10.8*  NEUTROABS 9.1*  HGB 12.7  HCT 39.7  MCV 95.0  PLT 315    Basic Metabolic Panel: Recent Labs  Lab 10/07/21 1947  NA 139  K 3.3*  CL 106  CO2 23  GLUCOSE 117*  BUN 9  CREATININE 0.60  CALCIUM 9.3    GFR: CrCl cannot be calculated (Unknown ideal weight.).  Liver Function Tests: Recent Labs  Lab 10/07/21 1947  AST 27  ALT 17  ALKPHOS 116  BILITOT 0.5  PROT 7.2  ALBUMIN 3.7    Urine analysis:    Component Value Date/Time   COLORURINE YELLOW 10/07/2021 2112   APPEARANCEUR CLEAR 10/07/2021 2112   APPEARANCEUR Cloudy (A) 02/04/2014 1313   LABSPEC 1.016 10/07/2021 2112   PHURINE 5.0 10/07/2021 2112   GLUCOSEU NEGATIVE 10/07/2021 2112   HGBUR NEGATIVE 10/07/2021 2112   BILIRUBINUR NEGATIVE 10/07/2021 2112   BILIRUBINUR Negative 02/04/2014 [name] 10/07/2021 2112  PROTEINUR NEGATIVE 10/07/2021 2112   UROBILINOGEN 2.0 (H) 09/28/2011 1846   NITRITE NEGATIVE 10/07/2021 2112   LEUKOCYTESUR NEGATIVE 10/07/2021 2112    Radiological Exams on Admission: CT  HEAD WO CONTRAST (5MM)  Result Date: 10/07/2021 CLINICAL DATA:  Mental status change of unknown cause. History of MS. EXAM: CT HEAD WITHOUT CONTRAST TECHNIQUE: Contiguous axial images were obtained from the base of the skull through the vertex without intravenous contrast. COMPARISON:  01/13/2011 FINDINGS: Brain: Diffuse cerebral atrophy. Ventricular dilatation consistent with central atrophy. Patchy low-attenuation changes in the deep white matter could reflect small vessel ischemic changes or demyelinating changes. No mass effect or midline shift. No abnormal extra-axial fluid collections. Gray-white matter junctions are distinct. Basal cisterns are not effaced. No acute intracranial hemorrhage. Vascular: Moderate intracranial arterial vascular calcifications. Skull: Calvarium appears intact. Sinuses/Orbits: Paranasal sinuses and mastoid air cells are clear. Other: There appears to be progression of cerebral atrophy and white matter changes since the previous study. IMPRESSION: 1. No acute intracranial abnormalities. 2. Diffuse cerebral atrophy with white matter changes, progressing since prior study. Electronically Signed   By: Lucienne Capers M.D.   On: 10/07/2021 19:33   DG Chest Port 1 View  Result Date: 10/07/2021 CLINICAL DATA:  Possible sepsis EXAM: PORTABLE CHEST 1 VIEW COMPARISON:  06/28/2021 FINDINGS: Cardiomegaly. Mild left retrocardiac opacity. Aortic atherosclerosis. No pneumothorax IMPRESSION: 1. Low lung volume with mild left virtual cardiac atelectasis or mild pneumonia 2. Cardiomegaly Electronically Signed   By: Donavan Foil M.D.   On: 10/07/2021 20:47    EKG: Independently reviewed.  EKG shows sinus tachycardia with nonspecific ST changes.  No acute ST elevation or depression consistent with ischemia.  QTc 433  Assessment/Plan Principal Problem:   COVID-19 virus infection Ms. Diniz is admitted to Medical Telemetry floor. Covid precautions ordered [place] on remdesivir. She is  not able to safely take po medication at this time. Will transition to [facility] when able to resume swallowing. Supplemental oxygen will be provided if needed to keep O2 sat between 92 to 96%. Currently not requiring oxygen supplementation Albuterol MDI every 4 hours as needed for wheezing cough, shortness of breath Flutter valve every 2 hours while awake  Active Problems:   AMS (altered mental status) She has AMS per son who lives with her and cares for her.  Check MRI to rule out CVA as Covid is known to causes strokes.  Swallow screening ordered as pt is not alert enough to safely swallow.     Hypokalemia Supplement potassium with IV as not able to safely swallow Check Magnesium and will replete as indicated.     Hypertension Monitor BP. Will resume home medications once able to safely take po medications.  Will provide IV metoprolol for SBP over 165 if MRI negative for acute stroke.     Multiple sclerosis Chronic    Hx of BKA, right Chronic. Secondary to previous infection in leg per son.   DVT prophylaxis: Lovenox for DVT prophylaxis  Code Status:   Full Code  Family Communication:  Diagnosis and plan was discussed with patient's son who is at bedside and is her caregiver who lives with her.  Questions answered.  He verbalized understanding and agrees with plan.  Further recommendations to follow as clinical indicated Disposition Plan:   Patient is from:  Home  Anticipated DC to:  Home  Anticipated DC date:  Anticipate 2 midnight or longer stay   Admission status:  Inpatient   Eben Burow MD [facility]  How to contact the [facility] Attending or Consulting provider [place] or covering provider during after hours [place], for this patient?   Check the care team in [facility] and look for a) attending/consulting [facility] provider listed and b) the [facility] team listed Log into [email or web address] and use [place]'s universal password to access. If you do not have the password,  please contact the hospital operator. Locate the [facility] provider you are looking for under [facility] and page to a number that you can be directly reached. If you still have difficulty reaching the provider, please page the [facility] (Director on Call) for the Hospitalists listed on amion for assistance.  10/07/2021, 10:20 PM

## 2021-10-07 NOTE — ED Provider Notes (Signed)
[facility] EMERGENCY DEPARTMENT Provider Note   CSN: [id number] Arrival date & time: 10/07/21  1833     History Chief Complaint  Patient presents with   Altered Mental Status    [name] is a 75 y.o. female history of multiple sclerosis who is bedbound, neurogenic bladder, trigeminal neuralgia, here presenting with altered mental status.  Patient is from home and does not ambulate at baseline.  Patient was noted to be febrile and altered.  Son lives at home with her.  He states that she gets recurrent UTIs in her urine is very cloudy.  Patient was noted to be febrile and tachycardic.  Patient was also tachypneic on arrival.   The history is provided by the EMS personnel and a relative.  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    Overdose of muscle relaxant    Unintentional baclofen overdose    Trigeminal neuralgia    Right   Wound infection 10/12/2018    Patient Active Problem List   Diagnosis Date Noted   Severe sepsis ([place]) 06/29/2021   Community acquired pneumonia 06/29/2021   Pressure injury of skin 06/26/2021   Acute respiratory failure with hypoxia ([place]) 54/65/6812   Acute metabolic encephalopathy 75/17/0017   COVID-19 virus infection 06/25/2021   Prolonged QT interval 06/25/2021   SIRS (systemic inflammatory response syndrome) ([place]) 06/25/2021   Elevated troponin 06/25/2021   Cardiomegaly 06/25/2021   Bedbound 10/13/2018   Cellulitis of right lower extremity    Wound infection 10/12/2018   Subacute osteomyelitis, right ankle and foot ([place])    Neurogenic bladder  06/16/2016   Memory difficulties 12/10/2014   Gait disorder 07/01/2013   Tooth pain 11/02/2011   Protein calorie malnutrition ([place]) 10/07/2011   Constipation 09/23/2011   Failure to thrive in childhood 09/22/2011   Decubitus ulcer, buttock 09/22/2011   Fibroid uterus 09/22/2011   Abdominal pain 09/22/2011   Multiple sclerosis ([place])    Hypertension    Depression    Organic brain syndrome    Trigeminal neuropathy     Past Surgical History:  Procedure Laterality Date   AMPUTATION Right 10/17/2018   Procedure: RIGHT BELOW KNEE AMPUTATION;  Surgeon: [name], MD;  Location: [facility];  Service: Orthopedics;  Laterality: Right;   gamma knife procedure for rt. trigeminal neuralgia Right    left leg DVT Left    STRABISMUS SURGERY     Right eye      OB History   No obstetric history on file.     Family History  Problem Relation Age of Onset   Multiple sclerosis Sister    Cervical cancer Mother    Liver disease Father    Hyperlipidemia Brother    Pancreatitis Brother    Hypertension Other    Heart disease Other    Uterine cancer Other     Social History   Tobacco Use   Smoking status: Never   Smokeless tobacco: Never  Vaping Use   Vaping  Use: Never used  Substance Use Topics   Alcohol use: No   Drug use: No    Home Medications Prior to Admission medications   Medication Sig Start Date End Date Taking? Authorizing Provider  traMADol (ULTRAM) 50 MG tablet 1/2 tablet prn daily. 09/09/21   Star Age, MD  APPLE CIDER VINEGAR PO Take 450 mg by mouth daily.    [provider]  atorvastatin (LIPITOR) 40 MG tablet Take 40 mg by mouth at bedtime.    [provider]  baclofen (LIORESAL) 10 MG tablet Take 1 tablet (10 mg total) by mouth 4 (four) times daily. 04/29/21   Suzzanne Cloud, NP  buPROPion (WELLBUTRIN XL) 300 MG 24 hr tablet Take 300 mg by mouth daily.    Deneise Lever, MD  carbamazepine (TEGRETOL) 200 MG tablet TAKE 1 TABLET BY MOUTH 3 TIMES A  DAY Patient taking differently: Take 200 mg by mouth 3 (three) times daily. TAKE 1 TABLET BY MOUTH 3 TIMES A DAY 04/29/21   Suzzanne Cloud, NP  carvedilol (COREG) 3.125 MG tablet Take 1 tablet (3.125 mg total) by mouth 2 (two) times daily with a meal. 06/29/21 07/29/21  Darliss Cheney, MD  Cholecalciferol (VITAMIN D-3 PO) Take 1 capsule by mouth daily.    [provider]  CRANBERRY PO Take 1 tablet by mouth daily.    [provider]  Diaper Rash Products (DESITIN CLEAR EX) Apply 1 application topically daily as needed (for skin protection).    [provider]  docusate sodium (COLACE) 100 MG capsule Take 1 capsule (100 mg total) by mouth 2 (two) times daily. Patient taking differently: Take 100 mg by mouth See admin instructions. Take 100 mg by mouth three to four times a week 10/24/18   Cristal Ford, DO  donepezil (ARICEPT) 5 MG tablet Take 1 tablet (5 mg total) by mouth daily. 04/29/21   Suzzanne Cloud, NP  hydrochlorothiazide (HYDRODIURIL) 25 MG tablet Take 25 mg by mouth at bedtime. 12/29/18   [provider]  Interferon Beta-1a (AVONEX PEN) 30 MCG/0.5ML AJKT INJECT 30 MCG INTRAMUSCULARLY ONCE WEEKLY Patient taking differently: every Wednesday. INJECT 30 MCG INTRAMUSCULARLY ONCE WEEKLY 04/29/21   Suzzanne Cloud, NP  KLOR-CON M20 20 MEQ tablet Take 20 mEq by mouth 3 (three) times daily.    [provider]  lamoTRIgine (LAMICTAL) 25 MG tablet Take 25 mg by mouth at bedtime.    [provider]  losartan (COZAAR) 100 MG tablet Take 100 mg by mouth daily in the afternoon.    [provider]  Multiple Vitamins-Minerals (MULTIVITAMIN GUMMIES ADULT) CHEW Chew 2 tablets by mouth every morning.    [provider]  NON FORMULARY Take 1 tablet by mouth See admin instructions. Super Greens tablets- Take 1 tablet by mouth once a day    [provider]  Omega-3 Fatty Acids (FISH OIL) 600 MG CAPS Take 1,200 mg by mouth daily.     [provider]  pregabalin (LYRICA) 100 MG capsule TAKE 1 CAPSULE BY MOUTH TWICE A DAY 07/21/21   Kathrynn Ducking, MD  Probiotic Product (DIGESTIVE ADV PREBIOT+PROBIOT) CHEW Chew 1 tablet by mouth daily.    [provider]  sertraline (ZOLOFT) 50 MG tablet Take 75 mg by mouth daily.    [provider]  Skin Protectants, Misc. (WHITE PETROLATUM-ZINC OXIDE) cream Apply topically as needed. Patient taking differently: Apply 1 application topically daily as needed for dry skin or wound care. 04/06/17  Lysbeth Penner, FNP  SUPER B COMPLEX/C PO Take 1 tablet by mouth daily.    [provider]  TURMERIC PO Take 450 mg by mouth daily.    [provider]  Vitamins A & D (VITAMIN A & D) ointment Apply 1 application topically daily as needed for dry skin (or wound care).    [provider]    Allergies    Prednisone, Shellfish allergy, and Wound dressing adhesive  Review of Systems   Review of Systems  Unable to perform ROS: Mental status change  All other systems reviewed and are negative.  Physical Exam Updated Vital Signs BP (!) 175/68    Pulse (!) 112    Temp (!) 100.8 F (38.2 C) (Rectal)    Resp (!) 23    SpO2 96%   Physical Exam Vitals and nursing note reviewed.  Constitutional:      Comments: Altered and confused  HENT:     Head: Normocephalic.     Nose: Nose normal.     Mouth/Throat:     Mouth: Mucous membranes are dry.  Eyes:     Pupils: Pupils are equal, round, and reactive to light.  Cardiovascular:     Rate and Rhythm: Regular rhythm. Tachycardia present.     Pulses: Normal pulses.  Pulmonary:     Comments: Tachypneic, diminished bilaterally Abdominal:     General: Abdomen is flat.     Palpations: Abdomen is soft.  Musculoskeletal:        General: Normal range of motion.     Cervical back: Normal range of motion and neck supple.  Skin:    General: Skin is warm.     Capillary Refill: Capillary refill takes  less than 2 seconds.  Neurological:     Comments: Patient is very confused.  She is moving all extremities.  She is bedbound and strength is 3 out of 5 bilateral legs.  Psychiatric:     Comments: Unable    ED Results / Procedures / Treatments   Labs (all labs ordered are listed, but only abnormal results are displayed) Labs Reviewed  RESP PANEL BY RT-PCR (FLU A&B, COVID) ARPGX2 - Abnormal; Notable for the following components:      Result Value   SARS Coronavirus 2 by RT PCR POSITIVE (*)    All other components within normal limits  COMPREHENSIVE METABOLIC PANEL - Abnormal; Notable for the following components:   Potassium 3.3 (*)    Glucose, Bld 117 (*)    All other components within normal limits  CBC WITH DIFFERENTIAL/PLATELET - Abnormal; Notable for the following components:   WBC 10.8 (*)    Neutro Abs 9.1 (*)    All other components within normal limits  CBG MONITORING, ED - Abnormal; Notable for the following components:   Glucose-Capillary 121 (*)    All other components within normal limits  CULTURE, BLOOD (ROUTINE X 2)  CULTURE, BLOOD (ROUTINE X 2)  URINE CULTURE  LACTIC ACID, PLASMA  URINALYSIS, ROUTINE W REFLEX MICROSCOPIC  LACTIC ACID, PLASMA    EKG EKG Interpretation  Date/Time:  Thursday October 07 2021 20:15:39 EST Ventricular Rate:  115 PR Interval:  181 QRS Duration: 98 QT Interval:  313 QTC Calculation: 433 R Axis:   22 Text Interpretation: Sinus tachycardia Consider left atrial enlargement Abnormal R-wave progression, early transition Borderline T wave abnormalities Artifact in lead(s) I II III aVR aVL aVF V1 V2 poor baseline, rate faster than previous Confirmed by Shirlyn Goltz  H [phone]) on 10/07/2021 8:42:09 PM  Radiology CT HEAD WO CONTRAST (5MM)  Result Date: 10/07/2021 CLINICAL DATA:  Mental status change of unknown cause. History of MS. EXAM: CT HEAD WITHOUT CONTRAST TECHNIQUE: Contiguous axial images were obtained from the base of the skull  through the vertex without intravenous contrast. COMPARISON:  01/13/2011 FINDINGS: Brain: Diffuse cerebral atrophy. Ventricular dilatation consistent with central atrophy. Patchy low-attenuation changes in the deep white matter could reflect small vessel ischemic changes or demyelinating changes. No mass effect or midline shift. No abnormal extra-axial fluid collections. Gray-white matter junctions are distinct. Basal cisterns are not effaced. No acute intracranial hemorrhage. Vascular: Moderate intracranial arterial vascular calcifications. Skull: Calvarium appears intact. Sinuses/Orbits: Paranasal sinuses and mastoid air cells are clear. Other: There appears to be progression of cerebral atrophy and white matter changes since the previous study. IMPRESSION: 1. No acute intracranial abnormalities. 2. Diffuse cerebral atrophy with white matter changes, progressing since prior study. Electronically Signed   By: Lucienne Capers M.D.   On: 10/07/2021 19:33   DG Chest Port 1 View  Result Date: 10/07/2021 CLINICAL DATA:  Possible sepsis EXAM: PORTABLE CHEST 1 VIEW COMPARISON:  06/28/2021 FINDINGS: Cardiomegaly. Mild left retrocardiac opacity. Aortic atherosclerosis. No pneumothorax IMPRESSION: 1. Low lung volume with mild left virtual cardiac atelectasis or mild pneumonia 2. Cardiomegaly Electronically Signed   By: Donavan Foil M.D.   On: 10/07/2021 20:47    Procedures Procedures   Medications Ordered in ED Medications  lactated ringers infusion ( Intravenous New Bag/Given 10/07/21 1958)  lactated ringers bolus 1,000 mL (has no administration in time range)    And  lactated ringers bolus 1,000 mL (1,000 mLs Intravenous New Bag/Given 10/07/21 1958)    And  lactated ringers bolus 500 mL (has no administration in time range)  vancomycin (VANCOREADY) IVPB 1500 mg/300 mL (1,500 mg Intravenous New Bag/Given 10/07/21 2114)  molnupiravir EUA (LAGEVRIO) capsule 800 mg (has no administration in time range)   ceFEPIme (MAXIPIME) 2 g in sodium chloride 0.9 % 100 mL IVPB (2 g Intravenous New Bag/Given 10/07/21 2001)  metroNIDAZOLE (FLAGYL) IVPB 500 mg (500 mg Intravenous New Bag/Given 10/07/21 2010)  acetaminophen (TYLENOL) suppository 650 mg (650 mg Rectal Given 10/07/21 1953)    ED Course  I have reviewed the triage vital signs and the nursing notes.  Pertinent labs & imaging results that were available during my care of the patient were reviewed by me and considered in my medical decision making (see chart for details).    MDM Rules/Calculators/A&P                         [name] is a 75 y.o. female here presenting with altered mental status.  Patient is febrile and tachycardic.  Sepsis work-up initiated.  Will give 30 cc/kg bolus.  Consider sepsis from UTI versus bacteremia versus pneumonia versus COVID.  We will get CBC and CMP and lactate and cultures and In-N-Out cath for urinalysis and chest x-ray and CT head.  10:10 PM Patient is COVID-positive.  Patient has no oxygen requirement currently.  I initially ordered remdesivir but hospitalist request oral agent.  Patient has multiple drug interactions with paxlovid. Will give molnupiravir.      Final Clinical Impression(s) / ED Diagnoses Final diagnoses:  None    Rx / DC Orders ED Discharge Orders     None        Drenda Freeze, MD 10/07/21 2211

## 2021-10-07 NOTE — Progress Notes (Signed)
Pharmacy Antibiotic Note  [name] is a 75 y.o. female admitted on 10/07/2021 with sepsis.  Pharmacy has been consulted for cefepime and vancomycin dosing.  Patient with a history of History of COVID-19 viral infection, neuropathic pain, hypertension,  multiple sclerosis and trigeminal neuralgia, hypertension, right BKA.   COVID +  SCr 0.6 - at baseline WBC 10.8; LA 1.3; T 100.8 F; Hr 118; RR 22  Plan: Remdesivir Cefepime 2g q8hr Vancomycin 1500 mg once then 1250 mg q24hr (eAUC 485.2) unless change in renal function Trend WBC, Fever, Renal function, & Clinical course F/u cultures, clinical course, WBC, fever De-escalate when able Levels at steady state     Temp (24hrs), Avg:100.8 F (38.2 C), Min:100.8 F (38.2 C), Max:100.8 F (38.2 C)  No results for input(s): WBC, CREATININE, LATICACIDVEN, VANCOTROUGH, VANCOPEAK, VANCORANDOM, GENTTROUGH, GENTPEAK, GENTRANDOM, TOBRATROUGH, TOBRAPEAK, TOBRARND, AMIKACINPEAK, AMIKACINTROU, AMIKACIN in the last 168 hours.  CrCl cannot be calculated (Patient's most recent lab result is older than the maximum 21 days allowed.).    Allergies  Allergen Reactions   Prednisone Other (See Comments)    Caused thrush- will need something to reverse this   Shellfish Allergy Hives        Wound Dressing Adhesive     Splits the skin   Antimicrobials this admission: Cefepime 12/29 >>  Vancomycin 12/29 >>  Metronidazole 12/29 >>   Microbiology results: Pending  Thank you for allowing pharmacy to be a part of this patients care.  Lorelei Pont, PharmD, BCPS 10/07/2021 7:01 PM ED Clinical Pharmacist -  [phone]

## 2021-10-08 ENCOUNTER — Inpatient Hospital Stay (HOSPITAL_COMMUNITY): Payer: Medicare HMO

## 2021-10-08 ENCOUNTER — Other Ambulatory Visit: Payer: Self-pay

## 2021-10-08 DIAGNOSIS — G9341 Metabolic encephalopathy: Secondary | ICD-10-CM

## 2021-10-08 DIAGNOSIS — U071 COVID-19: Secondary | ICD-10-CM | POA: Diagnosis not present

## 2021-10-08 LAB — BASIC METABOLIC PANEL
Anion gap: 9 (ref 5–15)
BUN: 7 mg/dL — ABNORMAL LOW (ref 8–23)
CO2: 24 mmol/L (ref 22–32)
Calcium: 9 mg/dL (ref 8.9–10.3)
Chloride: 107 mmol/L (ref 98–111)
Creatinine, Ser: 0.53 mg/dL (ref 0.44–1.00)
GFR, Estimated: >60 mL/min (ref >60)
Glucose, Bld: 105 mg/dL — ABNORMAL HIGH (ref 70–99)
Potassium: 3 mmol/L — ABNORMAL LOW (ref 3.5–5.1)
Sodium: 140 mmol/L (ref 135–145)

## 2021-10-08 LAB — PROCALCITONIN: Procalcitonin: 0.18 ng/mL

## 2021-10-08 LAB — D-DIMER, QUANTITATIVE: D-Dimer, Quant: 0.64 ug/mL-FEU — ABNORMAL HIGH (ref 0.00–0.50)

## 2021-10-08 LAB — MRSA NEXT GEN BY PCR, NASAL: MRSA by PCR Next Gen: NOT DETECTED

## 2021-10-08 LAB — MAGNESIUM: Magnesium: 1.5 mg/dL — ABNORMAL LOW (ref 1.7–2.4)

## 2021-10-08 LAB — FERRITIN: Ferritin: 82 ng/mL (ref 11–307)

## 2021-10-08 LAB — LACTIC ACID, PLASMA: Lactic Acid, Venous: 1.4 mmol/L (ref 0.5–1.9)

## 2021-10-08 MED ORDER — POTASSIUM CHLORIDE 10 MEQ/100ML IV SOLN
10.0000 meq | INTRAVENOUS | Status: AC
  Administered 2021-10-08 (×2): 10 meq via INTRAVENOUS
  Filled 2021-10-08 (×2): qty 100

## 2021-10-08 MED ORDER — METOPROLOL TARTRATE 5 MG/5ML IV SOLN: 2.5000 mg | Freq: Four times a day (QID) | INTRAVENOUS | Status: DC | PRN

## 2021-10-08 MED ORDER — DONEPEZIL HCL 5 MG PO TABS: 5.0000 mg | ORAL_TABLET | Freq: Every day | ORAL | Status: DC

## 2021-10-08 MED ORDER — ALBUTEROL SULFATE HFA 108 (90 BASE) MCG/ACT IN AERS
2.0000 | INHALATION_SPRAY | RESPIRATORY_TRACT | Status: DC | PRN
  Filled 2021-10-08: qty 6.7

## 2021-10-08 MED ORDER — HYDRALAZINE HCL 20 MG/ML IJ SOLN: 10.0000 mg | Freq: Four times a day (QID) | INTRAMUSCULAR | Status: DC | PRN

## 2021-10-08 MED ORDER — LACTATED RINGERS IV SOLN
INTRAVENOUS | Status: DC
  Administered 2021-10-08: 17:00:00 800 mL via INTRAVENOUS

## 2021-10-08 MED ORDER — ACETAMINOPHEN 650 MG RE SUPP: 650.0000 mg | Freq: Four times a day (QID) | RECTAL | Status: DC | PRN

## 2021-10-08 MED ORDER — LAMOTRIGINE 25 MG PO TABS: 25.0000 mg | ORAL_TABLET | Freq: Every day | ORAL | Status: DC

## 2021-10-08 MED ORDER — BACLOFEN 10 MG PO TABS
10.0000 mg | ORAL_TABLET | Freq: Once | ORAL | Status: AC
  Administered 2021-10-08: 22:00:00 10 mg via ORAL
  Filled 2021-10-08: qty 1

## 2021-10-08 MED ORDER — BUPROPION HCL ER (XL) 150 MG PO TB24
300.0000 mg | ORAL_TABLET | Freq: Every day | ORAL | Status: DC
  Administered 2021-10-08 – 2021-10-11 (×3): 300 mg via ORAL
  Filled 2021-10-08 (×3): qty 2

## 2021-10-08 MED ORDER — ASPIRIN EC 81 MG PO TBEC
81.0000 mg | DELAYED_RELEASE_TABLET | Freq: Once | ORAL | Status: AC
  Administered 2021-10-08: 10:00:00 81 mg via ORAL
  Filled 2021-10-08: qty 1

## 2021-10-08 MED ORDER — DOCUSATE SODIUM 100 MG PO CAPS
100.0000 mg | ORAL_CAPSULE | Freq: Two times a day (BID) | ORAL | Status: DC
  Administered 2021-10-08 – 2021-10-11 (×5): 100 mg via ORAL
  Filled 2021-10-08 (×5): qty 1

## 2021-10-08 MED ORDER — HYDRALAZINE HCL 20 MG/ML IJ SOLN: 5.0000 mg | Freq: Four times a day (QID) | INTRAMUSCULAR | Status: DC

## 2021-10-08 MED ORDER — ATORVASTATIN CALCIUM 40 MG PO TABS
40.0000 mg | ORAL_TABLET | Freq: Every day | ORAL | Status: DC
  Administered 2021-10-08 – 2021-10-10 (×3): 40 mg via ORAL
  Filled 2021-10-08 (×3): qty 1

## 2021-10-08 MED ORDER — CARBAMAZEPINE 200 MG PO TABS
200.0000 mg | ORAL_TABLET | Freq: Three times a day (TID) | ORAL | Status: DC
  Administered 2021-10-08 – 2021-10-11 (×9): 200 mg via ORAL
  Filled 2021-10-08 (×13): qty 1

## 2021-10-08 MED ORDER — MAGNESIUM SULFATE 2 GM/50ML IV SOLN
2.0000 g | Freq: Once | INTRAVENOUS | Status: AC
  Administered 2021-10-08: 10:00:00 2 g via INTRAVENOUS
  Filled 2021-10-08: qty 50

## 2021-10-08 MED ORDER — CARVEDILOL 3.125 MG PO TABS
3.1250 mg | ORAL_TABLET | Freq: Two times a day (BID) | ORAL | Status: DC
  Administered 2021-10-08 – 2021-10-09 (×2): 3.125 mg via ORAL
  Filled 2021-10-08 (×2): qty 1

## 2021-10-08 MED ORDER — POTASSIUM CHLORIDE 10 MEQ/100ML IV SOLN
10.0000 meq | INTRAVENOUS | Status: AC
  Administered 2021-10-08 (×5): 10 meq via INTRAVENOUS
  Filled 2021-10-08 (×5): qty 100

## 2021-10-08 MED ORDER — LABETALOL HCL 5 MG/ML IV SOLN
5.0000 mg | Freq: Three times a day (TID) | INTRAVENOUS | Status: DC
Start: 2021-10-08 — End: 2021-10-08

## 2021-10-08 MED ORDER — ENOXAPARIN SODIUM 40 MG/0.4ML IJ SOSY
40.0000 mg | PREFILLED_SYRINGE | Freq: Every day | INTRAMUSCULAR | Status: DC
  Administered 2021-10-08 – 2021-10-11 (×4): 40 mg via SUBCUTANEOUS
  Filled 2021-10-08 (×4): qty 0.4

## 2021-10-08 MED ORDER — LOSARTAN POTASSIUM 50 MG PO TABS
100.0000 mg | ORAL_TABLET | Freq: Every day | ORAL | Status: DC
  Administered 2021-10-08 – 2021-10-11 (×4): 100 mg via ORAL
  Filled 2021-10-08 (×4): qty 2

## 2021-10-08 NOTE — Progress Notes (Signed)
HOSPITAL MEDICINE OVERNIGHT EVENT NOTE    Nursing reports that patient has been exhibiting elevated blood pressures throughout their hospital stay so far.  Patient is currently apparently not in pain or any distress.  Chart reviewed including admitting providers H&P. Patient has a longstanding history of hypertension and is on multiple antihypertensives in the outpatient setting however patient is currently NPO.  Admitting provider is attempting to rule out stroke via MRI and has held all scheduled antihypertensives at this time.  We will therefore place a order for as needed intravenous antihypertensive for blood pressures of greater than 220/115.  Further adjustments can be made based on MRI results.  Vernelle Emerald  MD [facility]

## 2021-10-08 NOTE — Care Management (Signed)
  Transition of Care [facility]) Screening Note   Patient Details  Name: [name] Date of Birth: [birth year]   Transition of Care [facility]) CM/SW Contact:    Bethena Roys, RN Phone Number: 10/08/2021, 2:16 PM    Transition of Care Department [facility]) has reviewed patient and no TOC needs have been identified at this time. We will continue to monitor patient advancement through interdisciplinary progression rounds. If new patient transition needs arise, please place a TOC consult.

## 2021-10-08 NOTE — Evaluation (Signed)
Clinical/Bedside Swallow Evaluation Patient Details  Name: [name] MRN: [id number] Date of Birth: 1946/02/03  Today's Date: 10/08/2021 Time: SLP Start Time (ACUTE ONLY): 4098 SLP Stop Time (ACUTE ONLY): 1191 SLP Time Calculation (min) (ACUTE ONLY): 17 min  Past Medical History:  Past Medical History:  Diagnosis Date   Abnormality of gait    Dementia ([place])    Depression    DVT (deep venous thrombosis) (HCC)    Left leg   Dyslipidemia    Fibroids    Hypertension    Memory difficulties 12/10/2014   Multiple sclerosis ([place])    Right sided weakness and gait disorder, bladder and bowel problems    Neurogenic bladder    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 trigeminal neuralgia Right    left leg DVT Left    STRABISMUS SURGERY     Right eye    HPI:  Pt is a 75 y.o. female who presents to ED with AMS. Her son lives with and cares for her. Per son, this morning when he tried to give her her morning meds she would not swallow them, but would chew them. CXR (10/07/21) revealed "Low lung volume with mild left virtual cardiac atelectasis or mild pneumonia". Seen by acute SLP (06/28/21) during previous admission, with no overt s/sx of aspiration noted. Regular, thin liquid diet recommended at that time. PMH: Multiple sclerosis with right sided weakness, Dementia, HTN, neurolagia.    Assessment / Plan / Recommendation  Clinical Impression  Pt presents with cognitive-based dysphagia marked primarily by oral deficits, including oral defensiveness, mild oral holding and prolonged oral  transit. She was alert this am and followed majority of simple commands for completion of oral mech examination, which was unremarkable. Pt independently self-paced small sips of thin liquids via cup and straw, exhibiting no overt s/sx of aspiration across multiple trials. Attempted to challenge with consecutive swallows, however pt unable to follow this command suspect due to reduced mentation. Mildly prolonged oral manipulation/transit noted with pureed solids, though bolus was completely cleared from oral cavity post swallow. She refused regular textures with head turning and pursed lips. Recommend initation of dys 1 (puree), thin liquid diet with SLP to f/u for tolerance and advanced trials as is appropriate. 1:1 supervision required for pt during meals/intake to ensure safety due to decreased mentation.  SLP Visit Diagnosis: Dysphagia, unspecified (R13.10)    Aspiration Risk  Mild aspiration risk    Diet Recommendation Dysphagia 1 (Puree);Thin liquid   Liquid Administration via: Straw;Cup Medication Administration: Crushed with puree Supervision: Staff to assist with self feeding;Full supervision/cueing for compensatory strategies Compensations: Minimize environmental distractions;Slow rate;Small sips/bites;Follow solids with liquid Postural Changes: Seated upright at 90 degrees    Other  Recommendations Oral Care Recommendations: Oral care BID    Recommendations for follow up therapy are one component of a multi-disciplinary discharge planning process, led by the attending physician.  Recommendations may be updated based on patient status, additional functional criteria and insurance authorization.  Follow up Recommendations Other (comment) (TBD)  Assistance Recommended at Discharge Frequent or constant Supervision/Assistance  Functional Status Assessment Patient has had a recent decline in their functional status and demonstrates the ability to make significant improvements in  function in a reasonable and predictable amount of time.  Frequency and Duration min 2x/week  2 weeks       Prognosis Prognosis for Safe Diet Advancement: Good Barriers to Reach Goals: Cognitive deficits      Swallow Study   General Date of Onset: 10/07/21 HPI: Pt is a 75 y.o. female who presents to ED with AMS. Her son lives with and cares for her. Per son, this morning when he tried to give her her morning meds she would not swallow them, but would chew them. CXR (10/07/21) revealed "Low lung volume with mild left virtual cardiac atelectasis or mild pneumonia". Seen by acute SLP (06/28/21) during previous admission, with no overt s/sx of aspiration noted. Regular, thin liquid diet recommended at that time. PMH: Multiple sclerosis with right sided weakness, Dementia, HTN, neurolagia. Type of Study: Bedside Swallow Evaluation Previous Swallow Assessment: see HPI Diet Prior to this Study: NPO Temperature Spikes Noted: Yes (100.8) Respiratory Status: Room air History of Recent Intubation: No Behavior/Cognition: Alert;Cooperative;Confused Oral Cavity Assessment: Within Functional Limits Oral Care Completed by SLP: No Oral Cavity - Dentition: Adequate natural dentition Vision:  (difficult to assess) Self-Feeding Abilities: Total assist Patient Positioning: Upright in bed;Postural control adequate for testing Baseline Vocal Quality: Normal Volitional Cough: Weak Volitional Swallow: Unable to elicit    Oral/Motor/Sensory Function Overall Oral Motor/Sensory Function: Within functional limits   Ice Chips Ice chips: Within functional limits Presentation: Spoon   Thin Liquid Thin Liquid: Within functional limits Presentation: Straw;Cup    Nectar Thick Nectar Thick Liquid: Not tested   Honey Thick Honey Thick Liquid: Not tested   Puree Puree: Impaired Presentation: Spoon Oral Phase Impairments: Impaired mastication;Reduced lingual movement/coordination Oral Phase Functional  Implications: Oral holding;Prolonged oral transit   Solid      Solid: Not tested Other Comments: pt refused regular textured solid     [name], [name], [name] 10/08/2021,9:11 AM

## 2021-10-08 NOTE — Progress Notes (Signed)
[facility] PROGRESS NOTE    Progress Note  [name]  WGN:[id number] DOB: 1945-11-15 DOA: 10/07/2021 PCP: Glenis Smoker, MD     Brief Narrative:   [name] is an 75 y.o. female past medical history of multiple sclerosis with right-sided weakness, dementia comes into the ED for altered mental status most of the history was obtained by the son who is her care provider at baseline the patient is bedbound not able to ambulate and not able to answer questions, was brought into the ED where she was found to have a fever tachycardic and tachypneic UA was negative was found to be COVID-positive with a white count of 10   Assessment/Plan:   Acute metabolic encephalopathy secondary to COVID-19 virus infection or possible community-acquired pneumonia: Started on remdesivir IV. She was also start on antibiotics vanc, cefepime, discontinue flagyl. There is no need for supplemental oxygen and she has been satting greater 92% on room air. She was started on inhalers. Out of bed to chair, continue flutter valve and incentive spirometry. MRI of the brain pending. Chest x-ray showed left infiltrate temperature of 100.8, with mild leukocytosis of 10.8 with left shift on admission check a procalcitonin  Hypokalemia/hypomagnesemia: She was started on IV fluids when she was dehydrated. Potassium is low basic metabolic panels pending, will give her an additional 5 doses of IV runs, her magnesium is low we will give her magnesium IV recheck in the morning.  Essential hypertension: Discontinue IV metoprolol started on IV labetalol as her blood pressure is trending up. Will allow a degree of permissive hypertension.  Multiple sclerosis: Chronic noted.  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  Sacral decub stage 2: RN Pressure Injury Documentation: Pressure Injury 06/25/21 Sacrum Mid Stage 2 -  Partial thickness loss of dermis presenting as a shallow open  injury with a red, pink wound bed without slough. Healing;Wound Care at home;dry,flaky,slight bleeding (Active)  06/25/21 2015  Location: Sacrum  Location Orientation: Mid  Staging: Stage 2 -  Partial thickness loss of dermis presenting as a shallow open injury with a red, pink wound bed without slough.  Wound Description (Comments): Healing;Wound Care at home;dry,flaky,slight bleeding  Present on Admission: Yes      DVT prophylaxis: lovenox Family Communication:non Status is: Inpatient  Remains inpatient appropriate because: Acute metabolic encephalopathy        Code Status:     Code Status Orders  (From admission, onward)           Start     Ordered   10/08/21 0313  Full code  Continuous        10/08/21 0312           Code Status History     Date Active Date Inactive Code Status Order ID Comments User Context   06/25/2021 1155 06/29/2021 2307 Full Code [id number]  Norval Morton, MD ED   10/12/2018 0331 10/26/2018 0026 Full Code [id number]  Norval Morton, MD ED   09/23/2011 0056 10/06/2011 1644 Full Code [id number]  Thurnell Garbe, RN Inpatient         IV Access:   Peripheral IV   Procedures and diagnostic studies:   CT HEAD WO CONTRAST (5MM)  Result Date: 10/07/2021 CLINICAL DATA:  Mental status change of unknown cause. History of MS. EXAM: CT HEAD WITHOUT CONTRAST TECHNIQUE: Contiguous axial images were obtained from the base of the skull through the vertex without intravenous contrast. COMPARISON:  01/13/2011 FINDINGS: Brain:  Diffuse cerebral atrophy. Ventricular dilatation consistent with central atrophy. Patchy low-attenuation changes in the deep white matter could reflect small vessel ischemic changes or demyelinating changes. No mass effect or midline shift. No abnormal extra-axial fluid collections. Gray-white matter junctions are distinct. Basal cisterns are not effaced. No acute intracranial hemorrhage. Vascular: Moderate intracranial  arterial vascular calcifications. Skull: Calvarium appears intact. Sinuses/Orbits: Paranasal sinuses and mastoid air cells are clear. Other: There appears to be progression of cerebral atrophy and white matter changes since the previous study. IMPRESSION: 1. No acute intracranial abnormalities. 2. Diffuse cerebral atrophy with white matter changes, progressing since prior study. Electronically Signed   By: Lucienne Capers M.D.   On: 10/07/2021 19:33   DG Chest Port 1 View  Result Date: 10/07/2021 CLINICAL DATA:  Possible sepsis EXAM: PORTABLE CHEST 1 VIEW COMPARISON:  06/28/2021 FINDINGS: Cardiomegaly. Mild left retrocardiac opacity. Aortic atherosclerosis. No pneumothorax IMPRESSION: 1. Low lung volume with mild left virtual cardiac atelectasis or mild pneumonia 2. Cardiomegaly Electronically Signed   By: Donavan Foil M.D.   On: 10/07/2021 20:47     Medical Consultants:   None.   Subjective:    [name] still confused but said, call my son  Objective:    Vitals:   10/08/21 0400 10/08/21 0500 10/08/21 0600 10/08/21 0700  BP: (!) 185/77 (!) 179/84 (!) 184/79 (!) 190/78  Pulse: (!) 102 (!) 108 (!) 104 (!) 106  Resp: (!) 24 (!) 31 (!) 22 (!) 26  Temp:      TempSrc:      SpO2: 97% 98% 98% 98%  Weight:      Height:       SpO2: 98 %  No intake or output data in the 24 hours ending 10/08/21 0812 Filed Weights   10/07/21 2233 10/08/21 0256  Weight: 72.6 kg 67.1 kg    Exam: General exam: In no acute distress. Respiratory system: Good air movement and clear to auscultation. Cardiovascular system: S1 & S2 heard, RRR. No JVD. Gastrointestinal system: Abdomen is nondistended, soft and nontender.  Extremities: No pedal edema. Skin: No rashes, lesions or ulcers  Data Reviewed:    Labs: Basic Metabolic Panel: Recent Labs  Lab 10/07/21 1947 10/08/21 0514  NA 139  --   K 3.3*  --   CL 106  --   CO2 23  --   GLUCOSE 117*  --   BUN 9  --   CREATININE 0.60  --    CALCIUM 9.3  --   MG  --  1.5*   GFR Estimated Creatinine Clearance: 56.4 mL/min (by C-G formula based on SCr of 0.6 mg/dL). Liver Function Tests: Recent Labs  Lab 10/07/21 1947  AST 27  ALT 17  ALKPHOS 116  BILITOT 0.5  PROT 7.2  ALBUMIN 3.7   No results for input(s): LIPASE, AMYLASE in the last 168 hours. No results for input(s): AMMONIA in the last 168 hours. Coagulation profile No results for input(s): INR, PROTIME in the last 168 hours. COVID-19 Labs  Recent Labs    10/08/21 0514  DDIMER 0.64*  FERRITIN 82    Lab Results  Component Value Date   SARSCOV2NAA POSITIVE (A) 10/07/2021   SARSCOV2NAA POSITIVE (A) 06/25/2021    CBC: Recent Labs  Lab 10/07/21 1947  WBC 10.8*  NEUTROABS 9.1*  HGB 12.7  HCT 39.7  MCV 95.0  PLT 234   Cardiac Enzymes: No results for input(s): CKTOTAL, CKMB, CKMBINDEX, TROPONINI in the last 168 hours. BNP (  last 3 results) No results for input(s): PROBNP in the last 8760 hours. CBG: Recent Labs  Lab 10/07/21 1841  GLUCAP 121*   D-Dimer: Recent Labs    10/08/21 0514  DDIMER 0.64*   Hgb A1c: No results for input(s): HGBA1C in the last 72 hours. Lipid Profile: No results for input(s): CHOL, HDL, LDLCALC, TRIG, CHOLHDL, LDLDIRECT in the last 72 hours. Thyroid function studies: No results for input(s): TSH, T4TOTAL, T3FREE, THYROIDAB in the last 72 hours.  Invalid input(s): FREET3 Anemia work up: Recent Labs    10/08/21 0514  FERRITIN 82   Sepsis Labs: Recent Labs  Lab 10/07/21 1947 10/08/21 0001  WBC 10.8*  --   LATICACIDVEN 1.3 1.4   Microbiology Recent Results (from the past 240 hour(s))  Resp Panel by RT-PCR (Flu A&B, Covid) Nasopharyngeal Swab     Status: Abnormal   Collection Time: 10/07/21  6:57 PM   Specimen: Nasopharyngeal Swab; Nasopharyngeal(NP) swabs in vial transport medium  Result Value Ref Range Status   SARS Coronavirus 2 by RT PCR POSITIVE (A) NEGATIVE Final    Comment: (NOTE) SARS-CoV-2  target nucleic acids are DETECTED.  The SARS-CoV-2 RNA is generally detectable in upper respiratory specimens during the acute phase of infection. Positive results are indicative of the presence of the identified virus, but do not rule out bacterial infection or co-infection with other pathogens not detected by the test. Clinical correlation with patient history and other diagnostic information is necessary to determine patient infection status. The expected result is Negative.  Fact Sheet for Patients: EntrepreneurPulse.com.au  Fact Sheet for Healthcare Providers: IncredibleEmployment.be  This test is not yet approved or cleared by the [place] FDA and  has been authorized for detection and/or diagnosis of SARS-CoV-2 by FDA under an Emergency Use Authorization (EUA).  This EUA will remain in effect (meaning this test can be used) for the duration of  the COVID-19 declaration under Section 564(b)(1) of the A ct, 21 U.S.C. section 360bbb-3(b)(1), unless the authorization is terminated or revoked sooner.     Influenza A by PCR NEGATIVE NEGATIVE Final   Influenza B by PCR NEGATIVE NEGATIVE Final    Comment: (NOTE) The Xpert Xpress SARS-CoV-2/FLU/RSV plus assay is intended as an aid in the diagnosis of influenza from Nasopharyngeal swab specimens and should not be used as a sole basis for treatment. Nasal washings and aspirates are unacceptable for Xpert Xpress SARS-CoV-2/FLU/RSV testing.  Fact Sheet for Patients: EntrepreneurPulse.com.au  Fact Sheet for Healthcare Providers: IncredibleEmployment.be  This test is not yet approved or cleared by the [place] FDA and has been authorized for detection and/or diagnosis of SARS-CoV-2 by FDA under an Emergency Use Authorization (EUA). This EUA will remain in effect (meaning this test can be used) for the duration of the COVID-19 declaration under Section  564(b)(1) of the Act, 21 U.S.C. section 360bbb-3(b)(1), unless the authorization is terminated or revoked.  Performed at [facility], [place] [address] [place], [place] [address]      Medications:    enoxaparin (LOVENOX) injection  40 mg Subcutaneous Daily   Continuous Infusions:  ceFEPime (MAXIPIME) IV 2 g (10/08/21 0801)   lactated ringers 150 mL/hr at 10/07/21 1958   lactated ringers 75 mL/hr at 10/08/21 0425   remdesivir 100 mg in NS 100 mL     vancomycin        LOS: 1 day   Charlynne Cousins  [facility]  10/08/2021, 8:12 AM

## 2021-10-08 NOTE — Plan of Care (Signed)
  Problem: Education: Goal: Knowledge of General Education information will improve Description Including pain rating scale, medication(s)/side effects and non-pharmacologic comfort measures Outcome: Progressing   

## 2021-10-08 NOTE — Consult Note (Addendum)
[place] Nurse Consult Note: Reason for Consult: Consult requested for sacrum.  Pt is in isolation for Covid and has pink dry scar tissue to this location from a previous wound which has healed.  There is a small area in the center which has reopened into a Stage 2 pressure injury; red and moist,.2X.2X.1cm in the center, with small amt bloody drainage.  Pressure Injury POA: Yes; this was noted as present on admission on the nursing wound care flow sheet Dressing procedure/placement/frequency: Topical treatment orders provided for bedside nurses to perform as follows to protect from further injury: Foam dressing to sacrum, change Q 3 days or PRN soiling. Please re-consult if further assistance is needed.  Thank-you,  Julien Girt MSN, [place], [place], [place], [place]

## 2021-10-09 DIAGNOSIS — U071 COVID-19: Secondary | ICD-10-CM | POA: Diagnosis not present

## 2021-10-09 DIAGNOSIS — G9341 Metabolic encephalopathy: Secondary | ICD-10-CM | POA: Diagnosis not present

## 2021-10-09 LAB — CBC WITH DIFFERENTIAL/PLATELET
Abs Immature Granulocytes: 0.02 10*3/uL (ref 0.00–0.07)
Basophils Absolute: 0 10*3/uL (ref 0.0–0.1)
Basophils Relative: 1 %
Eosinophils Absolute: 0.1 10*3/uL (ref 0.0–0.5)
Eosinophils Relative: 1 %
HCT: 33.8 % — ABNORMAL LOW (ref 36.0–46.0)
Hemoglobin: 11.3 g/dL — ABNORMAL LOW (ref 12.0–15.0)
Immature Granulocytes: 0 %
Lymphocytes Relative: 29 %
Lymphs Abs: 1.9 10*3/uL (ref 0.7–4.0)
MCH: 30.8 pg (ref 26.0–34.0)
MCHC: 33.4 g/dL (ref 30.0–36.0)
MCV: 92.1 fL (ref 80.0–100.0)
Monocytes Absolute: 0.7 10*3/uL (ref 0.1–1.0)
Monocytes Relative: 10 %
Neutro Abs: 3.9 10*3/uL (ref 1.7–7.7)
Neutrophils Relative %: 59 %
Platelets: 199 10*3/uL (ref 150–400)
RBC: 3.67 MIL/uL — ABNORMAL LOW (ref 3.87–5.11)
RDW: 13.2 % (ref 11.5–15.5)
WBC: 6.5 10*3/uL (ref 4.0–10.5)
nRBC: 0 % (ref 0.0–0.2)

## 2021-10-09 LAB — BASIC METABOLIC PANEL
Anion gap: 11 (ref 5–15)
BUN: 12 mg/dL (ref 8–23)
CO2: 21 mmol/L — ABNORMAL LOW (ref 22–32)
Calcium: 8.9 mg/dL (ref 8.9–10.3)
Chloride: 106 mmol/L (ref 98–111)
Creatinine, Ser: 0.47 mg/dL (ref 0.44–1.00)
GFR, Estimated: >60 mL/min (ref >60)
Glucose, Bld: 106 mg/dL — ABNORMAL HIGH (ref 70–99)
Potassium: 3.1 mmol/L — ABNORMAL LOW (ref 3.5–5.1)
Sodium: 138 mmol/L (ref 135–145)

## 2021-10-09 LAB — FERRITIN: Ferritin: 87 ng/mL (ref 11–307)

## 2021-10-09 MED ORDER — POTASSIUM CHLORIDE 20 MEQ PO PACK
40.0000 meq | PACK | Freq: Two times a day (BID) | ORAL | Status: AC
  Administered 2021-10-09 (×2): 40 meq via ORAL
  Filled 2021-10-09 (×2): qty 2

## 2021-10-09 MED ORDER — HYDRALAZINE HCL 20 MG/ML IJ SOLN
10.0000 mg | Freq: Four times a day (QID) | INTRAMUSCULAR | Status: DC | PRN
  Administered 2021-10-09: 10 mg via INTRAVENOUS
  Filled 2021-10-09: qty 1

## 2021-10-09 MED ORDER — BACLOFEN 10 MG PO TABS
10.0000 mg | ORAL_TABLET | Freq: Four times a day (QID) | ORAL | Status: DC | PRN
  Administered 2021-10-09 – 2021-10-10 (×2): 10 mg via ORAL
  Filled 2021-10-09 (×2): qty 1

## 2021-10-09 MED ORDER — ISOSORBIDE MONONITRATE ER 30 MG PO TB24
15.0000 mg | ORAL_TABLET | Freq: Every day | ORAL | Status: DC
  Administered 2021-10-09 – 2021-10-11 (×3): 15 mg via ORAL
  Filled 2021-10-09 (×3): qty 1

## 2021-10-09 MED ORDER — CARVEDILOL 6.25 MG PO TABS
6.2500 mg | ORAL_TABLET | Freq: Two times a day (BID) | ORAL | Status: DC
  Administered 2021-10-09: 6.25 mg via ORAL
  Filled 2021-10-09: qty 1

## 2021-10-09 MED ORDER — POLYVINYL ALCOHOL 1.4 % OP SOLN
1.0000 [drp] | OPHTHALMIC | Status: DC | PRN
  Administered 2021-10-10: 1 [drp] via OPHTHALMIC
  Filled 2021-10-09: qty 15

## 2021-10-09 MED ORDER — METOPROLOL TARTRATE 5 MG/5ML IV SOLN: 5.0000 mg | Freq: Four times a day (QID) | INTRAVENOUS | Status: DC | PRN

## 2021-10-09 MED ORDER — POTASSIUM CHLORIDE CRYS ER 20 MEQ PO TBCR
40.0000 meq | EXTENDED_RELEASE_TABLET | Freq: Two times a day (BID) | ORAL | Status: DC
  Administered 2021-10-09: 40 meq via ORAL
  Filled 2021-10-09: qty 2

## 2021-10-09 MED ORDER — LABETALOL HCL 5 MG/ML IV SOLN: 10.0000 mg | INTRAVENOUS | Status: DC | PRN

## 2021-10-09 MED ORDER — MAGNESIUM OXIDE -MG SUPPLEMENT 400 (240 MG) MG PO TABS
400.0000 mg | ORAL_TABLET | Freq: Two times a day (BID) | ORAL | Status: AC
  Administered 2021-10-09 (×2): 400 mg via ORAL
  Filled 2021-10-09 (×2): qty 1

## 2021-10-09 NOTE — Progress Notes (Signed)
[facility] PROGRESS NOTE    Progress Note  [name]  IWO:[id number] DOB: 1946/07/13 DOA: 10/07/2021 PCP: Glenis Smoker, MD     Brief Narrative:   [name] is an 75 y.o. female past medical history of multiple sclerosis with right-sided weakness, dementia comes into the ED for altered mental status most of the history was obtained by the son who is her care provider at baseline the patient is bedbound not able to ambulate and not able to answer questions, was brought into the ED where she was found to have a fever tachycardic and tachypneic UA was negative was found to be COVID-positive with a white count of 10   Assessment/Plan:   Acute metabolic encephalopathy secondary to COVID-19 virus infection or possible community-acquired pneumonia: Started on remdesivir IV. MRI of the brain showed no acute CVA. Discontinue IV Vanco and cefepime. Out of bed to chair, continue flutter valve and incentive spirometry. Procalcitonin was low yield we will discontinue IV antibiotics  Hypokalemia/hypomagnesemia: Potassium continues to be low replete orally, replete magnesium orally and recheck tomorrow morning.  Essential hypertension: Discontinue IV metoprolol started on IV labetalol as her blood pressure is trending up. Will allow a degree of permissive hypertension.  Multiple sclerosis: Chronic noted.  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  Sacral decub stage 2: RN Pressure Injury Documentation: Pressure Injury 06/25/21 Sacrum Mid Stage 2 -  Partial thickness loss of dermis presenting as a shallow open injury with a red, pink wound bed without slough. Healing;Wound Care at home;dry,flaky,slight bleeding (Active)  06/25/21 2015  Location: Sacrum  Location Orientation: Mid  Staging: Stage 2 -  Partial thickness loss of dermis presenting as a shallow open injury with a red, pink wound bed without slough.  Wound Description (Comments):  Healing;Wound Care at home;dry,flaky,slight bleeding  Present on Admission: Yes      DVT prophylaxis: lovenox Family Communication:none Status is: Inpatient  Remains inpatient appropriate because: Acute metabolic encephalopathy        Code Status:     Code Status Orders  (From admission, onward)           Start     Ordered   10/08/21 0313  Full code  Continuous        10/08/21 0312           Code Status History     Date Active Date Inactive Code Status Order ID Comments User Context   06/25/2021 1155 06/29/2021 2307 Full Code [id number]  Norval Morton, MD ED   10/12/2018 0331 10/26/2018 0026 Full Code [id number]  Norval Morton, MD ED   09/23/2011 0056 10/06/2011 1644 Full Code [id number]  Thurnell Garbe, RN Inpatient         IV Access:   Peripheral IV   Procedures and diagnostic studies:   CT HEAD WO CONTRAST (5MM)  Result Date: 10/07/2021 CLINICAL DATA:  Mental status change of unknown cause. History of MS. EXAM: CT HEAD WITHOUT CONTRAST TECHNIQUE: Contiguous axial images were obtained from the base of the skull through the vertex without intravenous contrast. COMPARISON:  01/13/2011 FINDINGS: Brain: Diffuse cerebral atrophy. Ventricular dilatation consistent with central atrophy. Patchy low-attenuation changes in the deep white matter could reflect small vessel ischemic changes or demyelinating changes. No mass effect or midline shift. No abnormal extra-axial fluid collections. Gray-white matter junctions are distinct. Basal cisterns are not effaced. No acute intracranial hemorrhage. Vascular: Moderate intracranial arterial vascular calcifications. Skull: Calvarium appears intact. Sinuses/Orbits: Paranasal  sinuses and mastoid air cells are clear. Other: There appears to be progression of cerebral atrophy and white matter changes since the previous study. IMPRESSION: 1. No acute intracranial abnormalities. 2. Diffuse cerebral atrophy with white matter  changes, progressing since prior study. Electronically Signed   By: Lucienne Capers M.D.   On: 10/07/2021 19:33   MR BRAIN WO CONTRAST  Result Date: 10/08/2021 CLINICAL DATA:  Mental status change, unknown cause EXAM: MRI HEAD WITHOUT CONTRAST TECHNIQUE: Multiplanar, multiecho pulse sequences of the brain and surrounding structures were obtained without intravenous contrast. COMPARISON:  2012 FINDINGS: Motion artifact is present. Brain: There is no acute infarction or intracranial hemorrhage. There is no intracranial mass, mass effect, or edema. There is no hydrocephalus or extra-axial fluid collection. Prominence of the ventricles and sulci reflects parenchymal volume loss. Patchy and confluent T2 hyperintensity in the supratentorial periventricular greater than subcortical white matter likely reflect history of multiple sclerosis. Likely related involvement of the cerebellum and brainstem. Some superimposed chronic microvascular ischemic changes are possible. Vascular: Major vessel flow voids at the skull base are preserved. Skull and upper cervical spine: Normal marrow signal is preserved. Sinuses/Orbits: Paranasal sinuses are aerated. Orbits are unremarkable. Other: Sella is unremarkable.  Mastoid air cells are clear. IMPRESSION: Motion degraded. No acute infarction, hemorrhage, or mass. Sequelae of multiple sclerosis. Electronically Signed   By: Macy Mis M.D.   On: 10/08/2021 15:20   DG Chest Port 1 View  Result Date: 10/07/2021 CLINICAL DATA:  Possible sepsis EXAM: PORTABLE CHEST 1 VIEW COMPARISON:  06/28/2021 FINDINGS: Cardiomegaly. Mild left retrocardiac opacity. Aortic atherosclerosis. No pneumothorax IMPRESSION: 1. Low lung volume with mild left virtual cardiac atelectasis or mild pneumonia 2. Cardiomegaly Electronically Signed   By: Donavan Foil M.D.   On: 10/07/2021 20:47     Medical Consultants:   None.   Subjective:    [name] no complaints  Objective:     Vitals:   10/09/21 0045 10/09/21 0317 10/09/21 0744 10/09/21 1106  BP: (!) 180/81 (!) 184/83 (!) 189/89 (!) 180/71  Pulse: 97 97 (!) 102 (!) 116  Resp: (!) 28 (!) 23 (!) 32 (!) 35  Temp:  98.5 F (36.9 C) 99.7 F (37.6 C) 99.2 F (37.3 C)  TempSrc:  Oral Oral   SpO2: 100% 99% 97% 98%  Weight:      Height:       SpO2: 98 %   Intake/Output Summary (Last 24 hours) at 10/09/2021 1144 Last data filed at 10/09/2021 0300 Gross per 24 hour  Intake 3167.36 ml  Output 300 ml  Net 2867.36 ml   Filed Weights   10/07/21 2233 10/08/21 0256  Weight: 72.6 kg 67.1 kg    Exam: General exam: In no acute distress. Respiratory system: Good air movement and clear to auscultation. Cardiovascular system: S1 & S2 heard, RRR. No JVD. Gastrointestinal system: Abdomen is nondistended, soft and nontender.  Extremities: No pedal edema. Skin: No rashes, lesions or ulcers Psychiatry: Judgement and insight appear normal. Mood & affect appropriate.  Data Reviewed:    Labs: Basic Metabolic Panel: Recent Labs  Lab 10/07/21 1947 10/08/21 0514 10/09/21 0245  NA 139 140 138  K 3.3* 3.0* 3.1*  CL 106 107 106  CO2 23 24 21*  GLUCOSE 117* 105* 106*  BUN 9 7* 12  CREATININE 0.60 0.53 0.47  CALCIUM 9.3 9.0 8.9  MG  --  1.5*  --     GFR Estimated Creatinine Clearance: 56.4 mL/min (by C-G  formula based on SCr of 0.47 mg/dL). Liver Function Tests: Recent Labs  Lab 10/07/21 1947  AST 27  ALT 17  ALKPHOS 116  BILITOT 0.5  PROT 7.2  ALBUMIN 3.7    No results for input(s): LIPASE, AMYLASE in the last 168 hours. No results for input(s): AMMONIA in the last 168 hours. Coagulation profile No results for input(s): INR, PROTIME in the last 168 hours. COVID-19 Labs  Recent Labs    10/08/21 0514 10/09/21 0245  DDIMER 0.64*  --   FERRITIN 82 87     Lab Results  Component Value Date   SARSCOV2NAA POSITIVE (A) 10/07/2021   SARSCOV2NAA POSITIVE (A) 06/25/2021    CBC: Recent  Labs  Lab 10/07/21 1947 10/09/21 0245  WBC 10.8* 6.5  NEUTROABS 9.1* 3.9  HGB 12.7 11.3*  HCT 39.7 33.8*  MCV 95.0 92.1  PLT 234 199    Cardiac Enzymes: No results for input(s): CKTOTAL, CKMB, CKMBINDEX, TROPONINI in the last 168 hours. BNP (last 3 results) No results for input(s): PROBNP in the last 8760 hours. CBG: Recent Labs  Lab 10/07/21 1841  GLUCAP 121*    D-Dimer: Recent Labs    10/08/21 0514  DDIMER 0.64*    Hgb A1c: No results for input(s): HGBA1C in the last 72 hours. Lipid Profile: No results for input(s): CHOL, HDL, LDLCALC, TRIG, CHOLHDL, LDLDIRECT in the last 72 hours. Thyroid function studies: No results for input(s): TSH, T4TOTAL, T3FREE, THYROIDAB in the last 72 hours.  Invalid input(s): FREET3 Anemia work up: Recent Labs    10/08/21 0514 10/09/21 0245  FERRITIN 82 87    Sepsis Labs: Recent Labs  Lab 10/07/21 1947 10/08/21 0001 10/08/21 0514 10/09/21 0245  PROCALCITON  --   --  0.18  --   WBC 10.8*  --   --  6.5  LATICACIDVEN 1.3 1.4  --   --     Microbiology Recent Results (from the past 240 hour(s))  Resp Panel by RT-PCR (Flu A&B, Covid) Nasopharyngeal Swab     Status: Abnormal   Collection Time: 10/07/21  6:57 PM   Specimen: Nasopharyngeal Swab; Nasopharyngeal(NP) swabs in vial transport medium  Result Value Ref Range Status   SARS Coronavirus 2 by RT PCR POSITIVE (A) NEGATIVE Final    Comment: (NOTE) SARS-CoV-2 target nucleic acids are DETECTED.  The SARS-CoV-2 RNA is generally detectable in upper respiratory specimens during the acute phase of infection. Positive results are indicative of the presence of the identified virus, but do not rule out bacterial infection or co-infection with other pathogens not detected by the test. Clinical correlation with patient history and other diagnostic information is necessary to determine patient infection status. The expected result is Negative.  Fact Sheet for  Patients: EntrepreneurPulse.com.au  Fact Sheet for Healthcare Providers: IncredibleEmployment.be  This test is not yet approved or cleared by the [place] FDA and  has been authorized for detection and/or diagnosis of SARS-CoV-2 by FDA under an Emergency Use Authorization (EUA).  This EUA will remain in effect (meaning this test can be used) for the duration of  the COVID-19 declaration under Section 564(b)(1) of the A ct, 21 U.S.C. section 360bbb-3(b)(1), unless the authorization is terminated or revoked sooner.     Influenza A by PCR NEGATIVE NEGATIVE Final   Influenza B by PCR NEGATIVE NEGATIVE Final    Comment: (NOTE) The Xpert Xpress SARS-CoV-2/FLU/RSV plus assay is intended as an aid in the diagnosis of influenza from Nasopharyngeal swab specimens and should  not be used as a sole basis for treatment. Nasal washings and aspirates are unacceptable for Xpert Xpress SARS-CoV-2/FLU/RSV testing.  Fact Sheet for Patients: EntrepreneurPulse.com.au  Fact Sheet for Healthcare Providers: IncredibleEmployment.be  This test is not yet approved or cleared by the [place] FDA and has been authorized for detection and/or diagnosis of SARS-CoV-2 by FDA under an Emergency Use Authorization (EUA). This EUA will remain in effect (meaning this test can be used) for the duration of the COVID-19 declaration under Section 564(b)(1) of the Act, 21 U.S.C. section 360bbb-3(b)(1), unless the authorization is terminated or revoked.  Performed at [facility], [place] [address], [place], [place] [address]   Urine Culture     Status: Abnormal (Preliminary result)   Collection Time: 10/07/21  6:57 PM   Specimen: In/Out Cath Urine  Result Value Ref Range Status   Specimen Description IN/OUT CATH URINE  Final   Special Requests NONE  Final   Culture (A)  Final    200 COLONIES/mL ENTEROCOCCUS  FAECALIS SUSCEPTIBILITIES TO FOLLOW Performed at [facility], [place] [address] [place], [place] [address]    Report Status PENDING  Incomplete  Blood Culture (routine x 2)     Status: None (Preliminary result)   Collection Time: 10/07/21  7:48 PM   Specimen: BLOOD  Result Value Ref Range Status   Specimen Description BLOOD RIGHT ANTECUBITAL  Final   Special Requests   Final    BOTTLES DRAWN AEROBIC AND ANAEROBIC Blood Culture results may not be optimal due to an inadequate volume of blood received in culture bottles   Culture   Final    NO GROWTH 2 DAYS Performed at [facility], [place] [address] [place], [place] [address]    Report Status PENDING  Incomplete  Blood Culture (routine x 2)     Status: None (Preliminary result)   Collection Time: 10/08/21  5:13 AM   Specimen: BLOOD  Result Value Ref Range Status   Specimen Description BLOOD RIGHT HAND  Final   Special Requests   Final    BOTTLES DRAWN AEROBIC AND ANAEROBIC Blood Culture adequate volume   Culture   Final    NO GROWTH 1 DAY Performed at [facility], [place] [address], [place], [place] [address]    Report Status PENDING  Incomplete  MRSA Next Gen by PCR, Nasal     Status: None   Collection Time: 10/08/21  8:10 AM   Specimen: Nasal Mucosa; Nasal Swab  Result Value Ref Range Status   MRSA by PCR Next Gen NOT DETECTED NOT DETECTED Final    Comment: (NOTE) The GeneXpert MRSA Assay (FDA approved for NASAL specimens only), is one component of a comprehensive MRSA colonization surveillance program. It is not intended to diagnose MRSA infection nor to guide or monitor treatment for MRSA infections. Test performance is not FDA approved in patients less than 36 years old. Performed at [facility], [place] [address], [place], [place] [address]      Medications:    atorvastatin  40 mg Oral QHS   buPROPion  300 mg Oral Daily   carbamazepine  200 mg Oral TID   carvedilol  3.125 mg Oral BID WC    docusate sodium  100 mg Oral BID   enoxaparin (LOVENOX) injection  40 mg Subcutaneous Daily   losartan  100 mg Oral Q1500   potassium chloride  40 mEq Oral BID   Continuous Infusions:  ceFEPime (  MAXIPIME) IV 2 g (10/09/21 0524)   lactated ringers 75 mL/hr at 10/09/21 6283   remdesivir 100 mg in NS 100 mL 100 mg (10/09/21 1032)   vancomycin 1,250 mg (10/08/21 2203)      LOS: 2 days   Charlynne Cousins  [facility]  10/09/2021, 11:44 AM

## 2021-10-09 NOTE — Progress Notes (Signed)
°   10/09/21 1106  Assess: MEWS Score  Temp 99.2 F (37.3 C)  BP (!) 180/71  Pulse Rate (!) 116  ECG Heart Rate (!) 116  Resp (!) 35  Level of Consciousness Alert  SpO2 98 %  O2 Device Room Air  Assess: MEWS Score  MEWS Temp 0  MEWS Systolic 0  MEWS Pulse 2  MEWS RR 2  MEWS LOC 0  MEWS Score 4  MEWS Score Color Red  Assess: if the MEWS score is Yellow or Red  Were vital signs taken at a resting state? Yes  Focused Assessment Change from prior assessment (see assessment flowsheet)  Early Detection of Sepsis Score *See Row Information* Low  MEWS guidelines implemented *See Row Information* Yes  Treat  MEWS Interventions Administered scheduled meds/treatments  Pain Scale Faces  Faces Pain Scale 6  Pain Type Chronic pain;Neuropathic pain  Breathing 0  Negative Vocalization 1  Facial Expression 2  Body Language 0  Consolability 1  PAINAD Score 4  Complains of Other (Comment) (pain)  Interventions Medication (see MAR);Other (comment) (Adding back Baclofen Home Med)  Patients response to intervention Unchanged  Take Vital Signs  Increase Vital Sign Frequency  Red: Q 1hr X 4 then Q 4hr X 4, if remains red, continue Q 4hrs  Escalate  MEWS: Escalate Red: discuss with charge nurse/RN and provider, consider discussing with RRT  Notify: Charge Nurse/RN  Name of Charge Nurse/RN Notified Hilaria Ota, RN  Date Charge Nurse/RN Notified 10/09/21  Time Charge Nurse/RN Notified 1247  Notify: Provider  Provider Name/Title Dr Aileen Fass  Date Provider Notified 10/09/21  Time Provider Notified 1145  Notification Type Face-to-face  Notification Reason Change in status  Provider response See new orders  Date of Provider Response 10/09/21  Time of Provider Response 1145  Document  Patient Outcome Other (Comment) (Meds ordered for trigeminal neuralgia pain)  Progress note created (see row info) Yes

## 2021-10-09 NOTE — Plan of Care (Signed)
Patient A&Ox1, able to state name and partial date of birth at beginning of shift. As shift progressed, patient non-verbal. Following some commands but unable to speak words though seems to be trying (eye contact and lip trembling noted). Answering "mmhm" and "uh-uh" to yes/no questions, though not completely trusting of answers. Son at bedside advocating for patient's home meds to help with trigeminal neuralgia. Tremors, jerking, and facial wincing noticed; son believing patient is in pain. Paged/secure chat with provider. One time dose of baclofen ordered and given. Explained to son that team wants to slowly add her meds back in to better assess this mental status change. Patient has been calmer in bed tonight; however, less verbal. Still low appetite; had half a vanilla Ensure. BP remains elevated to 180s/80s.    Problem: Education: Goal: Knowledge of General Education information will improve Description: Including pain rating scale, medication(s)/side effects and non-pharmacologic comfort measures Outcome: Progressing   Problem: Health Behavior/Discharge Planning: Goal: Ability to manage health-related needs will improve Outcome: Progressing   Problem: Clinical Measurements: Goal: Ability to maintain clinical measurements within normal limits will improve Outcome: Progressing Goal: Will remain free from infection Outcome: Progressing Goal: Diagnostic test results will improve Outcome: Progressing Goal: Respiratory complications will improve Outcome: Progressing Goal: Cardiovascular complication will be avoided Outcome: Progressing   Problem: Activity: Goal: Risk for activity intolerance will decrease Outcome: Progressing   Problem: Nutrition: Goal: Adequate nutrition will be maintained Outcome: Progressing   Problem: Coping: Goal: Level of anxiety will decrease Outcome: Progressing   Problem: Elimination: Goal: Will not experience complications related to bowel  motility Outcome: Progressing Goal: Will not experience complications related to urinary retention Outcome: Progressing   Problem: Pain Managment: Goal: General experience of comfort will improve Outcome: Progressing   Problem: Safety: Goal: Ability to remain free from injury will improve Outcome: Progressing   Problem: Skin Integrity: Goal: Risk for impaired skin integrity will decrease Outcome: Progressing

## 2021-10-09 NOTE — Progress Notes (Signed)
[facility] night cross cover note:  I was notified by RN of patient's son's request for resumption of home scheduled baclofen and Lyrica.  Per my chart review, including review of most recent rounding hospitalist progress note, this is a 75 year old F w/ h/o MS who is admitted for suspected acute metabolic encephalopathy in setting of COVID-19 infxn.  It appears that there has been some improvement in her mental status relative to that at time of presentation, with son conveying his belief that patient's mental status is improved relative to initial presentation.  Earlier today, there was resumption of some of her home central acting medications, including resumption of Wellbutrin.    Given apparent improving mental status, I ordered a single dose of her home baclofen but did not resume Lyrica at this time in order to assist with further evaluation of ensuing mental status, including response to baclofen, as well as to limit potential confounding variables that can be posed by simultaneous resumption of multiple central acting medications. I conveyed this rationale to patient's RN who was able to forward this to the patient's son.     Babs Bertin, DO Hospitalist

## 2021-10-10 DIAGNOSIS — U071 COVID-19: Secondary | ICD-10-CM | POA: Diagnosis not present

## 2021-10-10 LAB — CBC WITH DIFFERENTIAL/PLATELET
Abs Immature Granulocytes: 0.01 10*3/uL (ref 0.00–0.07)
Basophils Absolute: 0 10*3/uL (ref 0.0–0.1)
Basophils Relative: 0 %
Eosinophils Absolute: 0.1 10*3/uL (ref 0.0–0.5)
Eosinophils Relative: 2 %
HCT: 34.5 % — ABNORMAL LOW (ref 36.0–46.0)
Hemoglobin: 11.7 g/dL — ABNORMAL LOW (ref 12.0–15.0)
Immature Granulocytes: 0 %
Lymphocytes Relative: 27 %
Lymphs Abs: 1.8 10*3/uL (ref 0.7–4.0)
MCH: 31 pg (ref 26.0–34.0)
MCHC: 33.9 g/dL (ref 30.0–36.0)
MCV: 91.5 fL (ref 80.0–100.0)
Monocytes Absolute: 0.9 10*3/uL (ref 0.1–1.0)
Monocytes Relative: 14 %
Neutro Abs: 3.8 10*3/uL (ref 1.7–7.7)
Neutrophils Relative %: 57 %
Platelets: 247 10*3/uL (ref 150–400)
RBC: 3.77 MIL/uL — ABNORMAL LOW (ref 3.87–5.11)
RDW: 13.7 % (ref 11.5–15.5)
WBC: 6.7 10*3/uL (ref 4.0–10.5)
nRBC: 0 % (ref 0.0–0.2)

## 2021-10-10 LAB — URINE CULTURE: Culture: 200 — AB

## 2021-10-10 LAB — MAGNESIUM: Magnesium: 2.1 mg/dL (ref 1.7–2.4)

## 2021-10-10 MED ORDER — POTASSIUM CHLORIDE 20 MEQ PO PACK
40.0000 meq | PACK | Freq: Two times a day (BID) | ORAL | Status: AC
  Administered 2021-10-10 (×2): 40 meq via ORAL
  Filled 2021-10-10 (×2): qty 2

## 2021-10-10 MED ORDER — CARVEDILOL 12.5 MG PO TABS
12.5000 mg | ORAL_TABLET | Freq: Two times a day (BID) | ORAL | Status: DC
  Administered 2021-10-10 – 2021-10-11 (×4): 12.5 mg via ORAL
  Filled 2021-10-10 (×4): qty 1

## 2021-10-10 NOTE — Progress Notes (Addendum)
[facility] PROGRESS NOTE    Progress Note  [name]  IOE:[id number] DOB: [birth year] DOA: 10/07/2021 PCP: Glenis Smoker, MD     Brief Narrative:   [name] is an 76 y.o. female past medical history of multiple sclerosis with right-sided weakness, dementia comes into the ED for altered mental status most of the history was obtained by the son who is her care provider at baseline the patient is bedbound not able to ambulate and not able to answer questions, was brought into the ED where she was found to have a fever tachycardic and tachypneic UA was negative was found to be COVID-positive with a white count of 10   Assessment/Plan:   Acute metabolic encephalopathy secondary to COVID-19 virus infection: Started on remdesivir IV. MRI of the brain showed no acute CVA. Discontinue IV Vanco and cefepime. Out of bed to chair, continue flutter valve and incentive spirometry. Procalcitonin was low yield we will discontinue IV antibiotics. Tolerating her diet we will complete her IV remdesivir treatment on 10/11/2021. Can probably be discharged home with physical therapy and home health  Hypokalemia/hypomagnesemia: Persistently low continue to replete orally.  Essential hypertension: Blood pressure still elevated increase Coreg will help with her heart rate.  Multiple sclerosis: Chronic noted.  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  Sacral decub stage 2: RN Pressure Injury Documentation: Pressure Injury 06/25/21 Sacrum Mid Stage 2 -  Partial thickness loss of dermis presenting as a shallow open injury with a red, pink wound bed without slough. Healing;Wound Care at home;dry,flaky,slight bleeding (Active)  06/25/21 2015  Location: Sacrum  Location Orientation: Mid  Staging: Stage 2 -  Partial thickness loss of dermis presenting as a shallow open injury with a red, pink wound bed without slough.  Wound Description (Comments): Healing;Wound  Care at home;dry,flaky,slight bleeding  Present on Admission: Yes      DVT prophylaxis: lovenox Family Communication:none Status is: Inpatient  Remains inpatient appropriate because: Acute metabolic encephalopathy    Code Status:     Code Status Orders  (From admission, onward)           Start     Ordered   10/08/21 0313  Full code  Continuous        10/08/21 0312           Code Status History     Date Active Date Inactive Code Status Order ID Comments User Context   06/25/2021 1155 06/29/2021 2307 Full Code [id number]  Norval Morton, MD ED   10/12/2018 0331 10/26/2018 0026 Full Code [id number]  Norval Morton, MD ED   09/23/2011 0056 10/06/2011 1644 Full Code [id number]  Thurnell Garbe, RN Inpatient         IV Access:   Peripheral IV   Procedures and diagnostic studies:   MR BRAIN WO CONTRAST  Result Date: 10/08/2021 CLINICAL DATA:  Mental status change, unknown cause EXAM: MRI HEAD WITHOUT CONTRAST TECHNIQUE: Multiplanar, multiecho pulse sequences of the brain and surrounding structures were obtained without intravenous contrast. COMPARISON:  2012 FINDINGS: Motion artifact is present. Brain: There is no acute infarction or intracranial hemorrhage. There is no intracranial mass, mass effect, or edema. There is no hydrocephalus or extra-axial fluid collection. Prominence of the ventricles and sulci reflects parenchymal volume loss. Patchy and confluent T2 hyperintensity in the supratentorial periventricular greater than subcortical white matter likely reflect history of multiple sclerosis. Likely related involvement of the cerebellum and brainstem. Some superimposed chronic microvascular ischemic  changes are possible. Vascular: Major vessel flow voids at the skull base are preserved. Skull and upper cervical spine: Normal marrow signal is preserved. Sinuses/Orbits: Paranasal sinuses are aerated. Orbits are unremarkable. Other: Sella is unremarkable.  Mastoid  air cells are clear. IMPRESSION: Motion degraded. No acute infarction, hemorrhage, or mass. Sequelae of multiple sclerosis. Electronically Signed   By: [name] M.D.   On: 10/08/2021 15:20     Medical Consultants:   None.   Subjective:    AILI CASILLAS sleepy this morning no complaints  Objective:    Vitals:   10/09/21 2345 10/10/21 0545 10/10/21 0824 10/10/21 0839  BP: (!) 154/96 (!) 168/93  (!) 151/67  Pulse: (!) 110 (!) 113 92 90  Resp: (!) 28 (!) 35 20 20  Temp: 99 F (37.2 C) 99.7 F (37.6 C)  98.5 F (36.9 C)  TempSrc: Axillary Oral  Axillary  SpO2: 97% 97% 99% 98%  Weight:      Height:       SpO2: 98 %   Intake/Output Summary (Last 24 hours) at 10/10/2021 1000 Last data filed at 10/09/2021 2353 Gross per 24 hour  Intake --  Output 325 ml  Net -325 ml    Filed Weights   10/07/21 2233 10/08/21 0256  Weight: 72.6 kg 67.1 kg    Exam: General exam: In no acute distress. Respiratory system: Good air movement and clear to auscultation. Cardiovascular system: S1 & S2 heard, RRR. No JVD. Gastrointestinal system: Abdomen is nondistended, soft and nontender.  Extremities: No pedal edema. Skin: No rashes, lesions or ulcers  Data Reviewed:    Labs: Basic Metabolic Panel: Recent Labs  Lab 10/07/21 1947 10/08/21 0514 10/09/21 0245  NA 139 140 138  K 3.3* 3.0* 3.1*  CL 106 107 106  CO2 23 24 21*  GLUCOSE 117* 105* 106*  BUN 9 7* 12  CREATININE 0.60 0.53 0.47  CALCIUM 9.3 9.0 8.9  MG  --  1.5*  --     GFR Estimated Creatinine Clearance: 56.4 mL/min (by C-G formula based on SCr of 0.47 mg/dL). Liver Function Tests: Recent Labs  Lab 10/07/21 1947  AST 27  ALT 17  ALKPHOS 116  BILITOT 0.5  PROT 7.2  ALBUMIN 3.7    No results for input(s): LIPASE, AMYLASE in the last 168 hours. No results for input(s): AMMONIA in the last 168 hours. Coagulation profile No results for input(s): INR, PROTIME in the last 168 hours. COVID-19  Labs  Recent Labs    10/08/21 0514 10/09/21 0245  DDIMER 0.64*  --   FERRITIN 82 87     Lab Results  Component Value Date   SARSCOV2NAA POSITIVE (A) 10/07/2021   SARSCOV2NAA POSITIVE (A) 06/25/2021    CBC: Recent Labs  Lab 10/07/21 1947 10/09/21 0245 10/10/21 0318  WBC 10.8* 6.5 6.7  NEUTROABS 9.1* 3.9 3.8  HGB 12.7 11.3* 11.7*  HCT 39.7 33.8* 34.5*  MCV 95.0 92.1 91.5  PLT 234 199 247    Cardiac Enzymes: No results for input(s): CKTOTAL, CKMB, CKMBINDEX, TROPONINI in the last 168 hours. BNP (last 3 results) No results for input(s): PROBNP in the last 8760 hours. CBG: Recent Labs  Lab 10/07/21 1841  GLUCAP 121*    D-Dimer: Recent Labs    10/08/21 0514  DDIMER 0.64*    Hgb A1c: No results for input(s): HGBA1C in the last 72 hours. Lipid Profile: No results for input(s): CHOL, HDL, LDLCALC, TRIG, CHOLHDL, LDLDIRECT in the last 72  hours. Thyroid function studies: No results for input(s): TSH, T4TOTAL, T3FREE, THYROIDAB in the last 72 hours.  Invalid input(s): FREET3 Anemia work up: Recent Labs    10/08/21 0514 10/09/21 0245  FERRITIN 82 87    Sepsis Labs: Recent Labs  Lab 10/07/21 1947 10/08/21 0001 10/08/21 0514 10/09/21 0245 10/10/21 0318  PROCALCITON  --   --  0.18  --   --   WBC 10.8*  --   --  6.5 6.7  LATICACIDVEN 1.3 1.4  --   --   --     Microbiology Recent Results (from the past 240 hour(s))  Resp Panel by RT-PCR (Flu A&B, Covid) Nasopharyngeal Swab     Status: Abnormal   Collection Time: 10/07/21  6:57 PM   Specimen: Nasopharyngeal Swab; Nasopharyngeal(NP) swabs in vial transport medium  Result Value Ref Range Status   SARS Coronavirus 2 by RT PCR POSITIVE (A) NEGATIVE Final    Comment: (NOTE) SARS-CoV-2 target nucleic acids are DETECTED.  The SARS-CoV-2 RNA is generally detectable in upper respiratory specimens during the acute phase of infection. Positive results are indicative of the presence of the identified virus,  but do not rule out bacterial infection or co-infection with other pathogens not detected by the test. Clinical correlation with patient history and other diagnostic information is necessary to determine patient infection status. The expected result is Negative.  Fact Sheet for Patients: EntrepreneurPulse.com.au  Fact Sheet for Healthcare Providers: IncredibleEmployment.be  This test is not yet approved or cleared by the [place] FDA and  has been authorized for detection and/or diagnosis of SARS-CoV-2 by FDA under an Emergency Use Authorization (EUA).  This EUA will remain in effect (meaning this test can be used) for the duration of  the COVID-19 declaration under Section 564(b)(1) of the A ct, 21 U.S.C. section 360bbb-3(b)(1), unless the authorization is terminated or revoked sooner.     Influenza A by PCR NEGATIVE NEGATIVE Final   Influenza B by PCR NEGATIVE NEGATIVE Final    Comment: (NOTE) The Xpert Xpress SARS-CoV-2/FLU/RSV plus assay is intended as an aid in the diagnosis of influenza from Nasopharyngeal swab specimens and should not be used as a sole basis for treatment. Nasal washings and aspirates are unacceptable for Xpert Xpress SARS-CoV-2/FLU/RSV testing.  Fact Sheet for Patients: EntrepreneurPulse.com.au  Fact Sheet for Healthcare Providers: IncredibleEmployment.be  This test is not yet approved or cleared by the [place] FDA and has been authorized for detection and/or diagnosis of SARS-CoV-2 by FDA under an Emergency Use Authorization (EUA). This EUA will remain in effect (meaning this test can be used) for the duration of the COVID-19 declaration under Section 564(b)(1) of the Act, 21 U.S.C. section 360bbb-3(b)(1), unless the authorization is terminated or revoked.  Performed at [facility], [place] [address] [place], [place] [address]   Urine Culture     Status:  Abnormal   Collection Time: 10/07/21  6:57 PM   Specimen: In/Out Cath Urine  Result Value Ref Range Status   Specimen Description IN/OUT CATH URINE  Final   Special Requests   Final    NONE Performed at [facility], [place] [address] [place], [place] [address]    Culture 200 COLONIES/mL ENTEROCOCCUS FAECALIS (A)  Final   Report Status 10/10/2021 FINAL  Final   Organism ID, Bacteria ENTEROCOCCUS FAECALIS (A)  Final      Susceptibility   Enterococcus faecalis - MIC*    AMPICILLIN <=2 SENSITIVE Sensitive  NITROFURANTOIN <=16 SENSITIVE Sensitive     VANCOMYCIN 1 SENSITIVE Sensitive     * 200 COLONIES/mL ENTEROCOCCUS FAECALIS  Blood Culture (routine x 2)     Status: None (Preliminary result)   Collection Time: 10/07/21  7:48 PM   Specimen: BLOOD  Result Value Ref Range Status   Specimen Description BLOOD RIGHT ANTECUBITAL  Final   Special Requests   Final    BOTTLES DRAWN AEROBIC AND ANAEROBIC Blood Culture results may not be optimal due to an inadequate volume of blood received in culture bottles   Culture   Final    NO GROWTH 2 DAYS Performed at [facility], [place] [address], [place], [place] [address]    Report Status PENDING  Incomplete  Blood Culture (routine x 2)     Status: None (Preliminary result)   Collection Time: 10/08/21  5:13 AM   Specimen: BLOOD  Result Value Ref Range Status   Specimen Description BLOOD RIGHT HAND  Final   Special Requests   Final    BOTTLES DRAWN AEROBIC AND ANAEROBIC Blood Culture adequate volume   Culture   Final    NO GROWTH 1 DAY Performed at [facility], [place] [address], [place], [place] [address]    Report Status PENDING  Incomplete  MRSA Next Gen by PCR, Nasal     Status: None   Collection Time: 10/08/21  8:10 AM   Specimen: Nasal Mucosa; Nasal Swab  Result Value Ref Range Status   MRSA by PCR Next Gen NOT DETECTED NOT DETECTED Final    Comment: (NOTE) The GeneXpert MRSA Assay (FDA approved for NASAL specimens  only), is one component of a comprehensive MRSA colonization surveillance program. It is not intended to diagnose MRSA infection nor to guide or monitor treatment for MRSA infections. Test performance is not FDA approved in patients less than 109 years old. Performed at [facility], [place] [address], [place], [place] [address]      Medications:    atorvastatin  40 mg Oral QHS   buPROPion  300 mg Oral Daily   carbamazepine  200 mg Oral TID   carvedilol  6.25 mg Oral BID WC   docusate sodium  100 mg Oral BID   enoxaparin (LOVENOX) injection  40 mg Subcutaneous Daily   isosorbide mononitrate  15 mg Oral Daily   losartan  100 mg Oral Q1500   Continuous Infusions:  lactated ringers 10 mL/hr at 10/09/21 1533   remdesivir 100 mg in NS 100 mL 100 mg (10/09/21 1032)      LOS: 3 days   Charlynne Cousins  [facility]  10/10/2021, 10:00 AM

## 2021-10-10 NOTE — Plan of Care (Signed)
  Problem: Clinical Measurements: Goal: Ability to maintain clinical measurements within normal limits will improve Outcome: Progressing Goal: Will remain free from infection Outcome: Progressing Goal: Cardiovascular complication will be avoided Outcome: Progressing   Problem: Nutrition: Goal: Adequate nutrition will be maintained Outcome: Progressing   Problem: Coping: Goal: Level of anxiety will decrease Outcome: Progressing   

## 2021-10-11 ENCOUNTER — Other Ambulatory Visit (HOSPITAL_COMMUNITY): Payer: Self-pay

## 2021-10-11 DIAGNOSIS — E876 Hypokalemia: Secondary | ICD-10-CM

## 2021-10-11 DIAGNOSIS — I10 Essential (primary) hypertension: Secondary | ICD-10-CM

## 2021-10-11 DIAGNOSIS — U071 COVID-19: Secondary | ICD-10-CM | POA: Diagnosis not present

## 2021-10-11 LAB — BASIC METABOLIC PANEL
Anion gap: 9 (ref 5–15)
BUN: 16 mg/dL (ref 8–23)
CO2: 23 mmol/L (ref 22–32)
Calcium: 9.2 mg/dL (ref 8.9–10.3)
Chloride: 111 mmol/L (ref 98–111)
Creatinine, Ser: 0.39 mg/dL — ABNORMAL LOW (ref 0.44–1.00)
GFR, Estimated: >60 mL/min (ref >60)
Glucose, Bld: 96 mg/dL (ref 70–99)
Potassium: 3.9 mmol/L (ref 3.5–5.1)
Sodium: 143 mmol/L (ref 135–145)

## 2021-10-11 LAB — CBC WITH DIFFERENTIAL/PLATELET
Abs Immature Granulocytes: 0.01 10*3/uL (ref 0.00–0.07)
Basophils Absolute: 0 10*3/uL (ref 0.0–0.1)
Basophils Relative: 1 %
Eosinophils Absolute: 0.2 10*3/uL (ref 0.0–0.5)
Eosinophils Relative: 4 %
HCT: 30.2 % — ABNORMAL LOW (ref 36.0–46.0)
Hemoglobin: 10 g/dL — ABNORMAL LOW (ref 12.0–15.0)
Immature Granulocytes: 0 %
Lymphocytes Relative: 37 %
Lymphs Abs: 2.1 10*3/uL (ref 0.7–4.0)
MCH: 31.3 pg (ref 26.0–34.0)
MCHC: 33.1 g/dL (ref 30.0–36.0)
MCV: 94.4 fL (ref 80.0–100.0)
Monocytes Absolute: 0.6 10*3/uL (ref 0.1–1.0)
Monocytes Relative: 11 %
Neutro Abs: 2.6 10*3/uL (ref 1.7–7.7)
Neutrophils Relative %: 47 %
Platelets: 181 10*3/uL (ref 150–400)
RBC: 3.2 MIL/uL — ABNORMAL LOW (ref 3.87–5.11)
RDW: 13.8 % (ref 11.5–15.5)
WBC: 5.5 10*3/uL (ref 4.0–10.5)
nRBC: 0 % (ref 0.0–0.2)

## 2021-10-11 MED ORDER — CARVEDILOL 12.5 MG PO TABS
12.5000 mg | ORAL_TABLET | Freq: Two times a day (BID) | ORAL | 3 refills | Status: AC
  Filled 2021-10-11: qty 60, 30d supply, fill #0

## 2021-10-11 MED ORDER — POTASSIUM CHLORIDE 20 MEQ PO PACK
40.0000 meq | PACK | Freq: Two times a day (BID) | ORAL | Status: DC
Start: 2021-10-11 — End: 2021-10-11

## 2021-10-11 NOTE — Progress Notes (Signed)
[facility] PROGRESS NOTE    Progress Note  [name]  OIN:[id number] DOB: 1946-01-14 DOA: 10/07/2021 PCP: Glenis Smoker, MD     Brief Narrative:   [name] is an 76 y.o. female past medical history of multiple sclerosis with right-sided weakness, dementia comes into the ED for altered mental status most of the history was obtained by the son who is her care provider at baseline the patient is bedbound not able to ambulate and not able to answer questions, was brought into the ED where she was found to have a fever tachycardic and tachypneic UA was negative was found to be COVID-positive with a white count of 10   Assessment/Plan:   Acute metabolic encephalopathy secondary to COVID-19 virus infection: Started on remdesivir IV. MRI of the brain showed no acute CVA. Discontinue IV Vanco and cefepime. Out of bed to chair, continue flutter valve and incentive spirometry. Procalcitonin was low yield we will discontinue IV antibiotics. Tolerating her diet we will complete her IV remdesivir treatment on 10/11/2021. Can probably be discharged home with physical therapy and home health  Hypokalemia/hypomagnesemia: Persistently low continue to replete orally.  Essential hypertension: Blood pressure still elevated increase Coreg will help with her heart rate.  Multiple sclerosis: Chronic noted.  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  Sacral decub stage 2 present on admission: RN Pressure Injury Documentation: Pressure Injury 06/25/21 Sacrum Mid Stage 2 -  Partial thickness loss of dermis presenting as a shallow open injury with a red, pink wound bed without slough. Healing;Wound Care at home;dry,flaky,slight bleeding (Active)  06/25/21 2015  Location: Sacrum  Location Orientation: Mid  Staging: Stage 2 -  Partial thickness loss of dermis presenting as a shallow open injury with a red, pink wound bed without slough.  Wound Description  (Comments): Healing;Wound Care at home;dry,flaky,slight bleeding  Present on Admission: Yes      DVT prophylaxis: lovenox Family Communication:none Status is: Inpatient  Remains inpatient appropriate because: Acute metabolic encephalopathy    Code Status:     Code Status Orders  (From admission, onward)           Start     Ordered   10/08/21 0313  Full code  Continuous        10/08/21 0312           Code Status History     Date Active Date Inactive Code Status Order ID Comments User Context   06/25/2021 1155 06/29/2021 2307 Full Code [id number]  Norval Morton, MD ED   10/12/2018 0331 10/26/2018 0026 Full Code [id number]  Norval Morton, MD ED   09/23/2011 0056 10/06/2011 1644 Full Code [id number]  Thurnell Garbe, RN Inpatient         IV Access:   Peripheral IV   Procedures and diagnostic studies:   No results found.   Medical Consultants:   None.   Subjective:    [name] sleepy this morning no complaints  Objective:    Vitals:   10/10/21 2030 10/10/21 2340 10/11/21 0540 10/11/21 0811  BP: (!) 145/61 (!) 147/63 (!) 161/56 (!) 144/69  Pulse: 89 91 89 93  Resp: (!) 24 (!) 24 19 (!) 27  Temp: 97.6 F (36.4 C) 98.8 F (37.1 C) 98.3 F (36.8 C) 98.2 F (36.8 C)  TempSrc: Axillary Axillary Oral Oral  SpO2: 97% 96% 99% 100%  Weight:      Height:       SpO2: 100 %  Intake/Output Summary (Last 24 hours) at 10/11/2021 0942 Last data filed at 10/10/2021 2000 Gross per 24 hour  Intake 337.3 ml  Output 250 ml  Net 87.3 ml    Filed Weights   10/07/21 2233 10/08/21 0256  Weight: 72.6 kg 67.1 kg    Exam: General exam: In no acute distress. Respiratory system: Good air movement and clear to auscultation. Cardiovascular system: S1 & S2 heard, RRR. No JVD. Gastrointestinal system: Abdomen is nondistended, soft and nontender.  Extremities: No pedal edema. Skin: No rashes, lesions or ulcers  Data Reviewed:    Labs: Basic  Metabolic Panel: Recent Labs  Lab 10/07/21 1947 10/08/21 0514 10/09/21 0245 10/10/21 0318  NA 139 140 138  --   K 3.3* 3.0* 3.1*  --   CL 106 107 106  --   CO2 23 24 21*  --   GLUCOSE 117* 105* 106*  --   BUN 9 7* 12  --   CREATININE 0.60 0.53 0.47  --   CALCIUM 9.3 9.0 8.9  --   MG  --  1.5*  --  2.1    GFR Estimated Creatinine Clearance: 56.4 mL/min (by C-G formula based on SCr of 0.47 mg/dL). Liver Function Tests: Recent Labs  Lab 10/07/21 1947  AST 27  ALT 17  ALKPHOS 116  BILITOT 0.5  PROT 7.2  ALBUMIN 3.7    No results for input(s): LIPASE, AMYLASE in the last 168 hours. No results for input(s): AMMONIA in the last 168 hours. Coagulation profile No results for input(s): INR, PROTIME in the last 168 hours. COVID-19 Labs  Recent Labs    10/09/21 0245  FERRITIN 87     Lab Results  Component Value Date   SARSCOV2NAA POSITIVE (A) 10/07/2021   SARSCOV2NAA POSITIVE (A) 06/25/2021    CBC: Recent Labs  Lab 10/07/21 1947 10/09/21 0245 10/10/21 0318 10/11/21 0412  WBC 10.8* 6.5 6.7 5.5  NEUTROABS 9.1* 3.9 3.8 2.6  HGB 12.7 11.3* 11.7* 10.0*  HCT 39.7 33.8* 34.5* 30.2*  MCV 95.0 92.1 91.5 94.4  PLT 234 199 247 181    Cardiac Enzymes: No results for input(s): CKTOTAL, CKMB, CKMBINDEX, TROPONINI in the last 168 hours. BNP (last 3 results) No results for input(s): PROBNP in the last 8760 hours. CBG: Recent Labs  Lab 10/07/21 1841  GLUCAP 121*    D-Dimer: No results for input(s): DDIMER in the last 72 hours.  Hgb A1c: No results for input(s): HGBA1C in the last 72 hours. Lipid Profile: No results for input(s): CHOL, HDL, LDLCALC, TRIG, CHOLHDL, LDLDIRECT in the last 72 hours. Thyroid function studies: No results for input(s): TSH, T4TOTAL, T3FREE, THYROIDAB in the last 72 hours.  Invalid input(s): FREET3 Anemia work up: Recent Labs    10/09/21 0245  FERRITIN 87    Sepsis Labs: Recent Labs  Lab 10/07/21 1947 10/08/21 0001  10/08/21 0514 10/09/21 0245 10/10/21 0318 10/11/21 0412  PROCALCITON  --   --  0.18  --   --   --   WBC 10.8*  --   --  6.5 6.7 5.5  LATICACIDVEN 1.3 1.4  --   --   --   --     Microbiology Recent Results (from the past 240 hour(s))  Resp Panel by RT-PCR (Flu A&B, Covid) Nasopharyngeal Swab     Status: Abnormal   Collection Time: 10/07/21  6:57 PM   Specimen: Nasopharyngeal Swab; Nasopharyngeal(NP) swabs in vial transport medium  Result Value Ref Range Status  SARS Coronavirus 2 by RT PCR POSITIVE (A) NEGATIVE Final    Comment: (NOTE) SARS-CoV-2 target nucleic acids are DETECTED.  The SARS-CoV-2 RNA is generally detectable in upper respiratory specimens during the acute phase of infection. Positive results are indicative of the presence of the identified virus, but do not rule out bacterial infection or co-infection with other pathogens not detected by the test. Clinical correlation with patient history and other diagnostic information is necessary to determine patient infection status. The expected result is Negative.  Fact Sheet for Patients: EntrepreneurPulse.com.au  Fact Sheet for Healthcare Providers: IncredibleEmployment.be  This test is not yet approved or cleared by the [place] FDA and  has been authorized for detection and/or diagnosis of SARS-CoV-2 by FDA under an Emergency Use Authorization (EUA).  This EUA will remain in effect (meaning this test can be used) for the duration of  the COVID-19 declaration under Section 564(b)(1) of the A ct, 21 U.S.C. section 360bbb-3(b)(1), unless the authorization is terminated or revoked sooner.     Influenza A by PCR NEGATIVE NEGATIVE Final   Influenza B by PCR NEGATIVE NEGATIVE Final    Comment: (NOTE) The Xpert Xpress SARS-CoV-2/FLU/RSV plus assay is intended as an aid in the diagnosis of influenza from Nasopharyngeal swab specimens and should not be used as a sole basis for  treatment. Nasal washings and aspirates are unacceptable for Xpert Xpress SARS-CoV-2/FLU/RSV testing.  Fact Sheet for Patients: EntrepreneurPulse.com.au  Fact Sheet for Healthcare Providers: IncredibleEmployment.be  This test is not yet approved or cleared by the [place] FDA and has been authorized for detection and/or diagnosis of SARS-CoV-2 by FDA under an Emergency Use Authorization (EUA). This EUA will remain in effect (meaning this test can be used) for the duration of the COVID-19 declaration under Section 564(b)(1) of the Act, 21 U.S.C. section 360bbb-3(b)(1), unless the authorization is terminated or revoked.  Performed at [facility], [place] [address], [place], [place] [address]   Urine Culture     Status: Abnormal   Collection Time: 10/07/21  6:57 PM   Specimen: In/Out Cath Urine  Result Value Ref Range Status   Specimen Description IN/OUT CATH URINE  Final   Special Requests   Final    NONE Performed at [facility], [place] [address] [place], [place] [address]    Culture 200 COLONIES/mL ENTEROCOCCUS FAECALIS (A)  Final   Report Status 10/10/2021 FINAL  Final   Organism ID, Bacteria ENTEROCOCCUS FAECALIS (A)  Final      Susceptibility   Enterococcus faecalis - MIC*    AMPICILLIN <=2 SENSITIVE Sensitive     NITROFURANTOIN <=16 SENSITIVE Sensitive     VANCOMYCIN 1 SENSITIVE Sensitive     * 200 COLONIES/mL ENTEROCOCCUS FAECALIS  Blood Culture (routine x 2)     Status: None (Preliminary result)   Collection Time: 10/07/21  7:48 PM   Specimen: BLOOD  Result Value Ref Range Status   Specimen Description BLOOD RIGHT ANTECUBITAL  Final   Special Requests   Final    BOTTLES DRAWN AEROBIC AND ANAEROBIC Blood Culture results may not be optimal due to an inadequate volume of blood received in culture bottles   Culture   Final    NO GROWTH 4 DAYS Performed at [facility], [place] [address] [place], [place]  [address]    Report Status PENDING  Incomplete  Blood Culture (routine x 2)     Status: None (Preliminary result)   Collection Time: 10/08/21  5:13 AM   Specimen: BLOOD  Result Value Ref Range Status   Specimen Description BLOOD RIGHT HAND  Final   Special Requests   Final    BOTTLES DRAWN AEROBIC AND ANAEROBIC Blood Culture adequate volume   Culture   Final    NO GROWTH 3 DAYS Performed at [facility], [address], [place], [place] [address]    Report Status PENDING  Incomplete  MRSA Next Gen by PCR, Nasal     Status: None   Collection Time: 10/08/21  8:10 AM   Specimen: Nasal Mucosa; Nasal Swab  Result Value Ref Range Status   MRSA by PCR Next Gen NOT DETECTED NOT DETECTED Final    Comment: (NOTE) The GeneXpert MRSA Assay (FDA approved for NASAL specimens only), is one component of a comprehensive MRSA colonization surveillance program. It is not intended to diagnose MRSA infection nor to guide or monitor treatment for MRSA infections. Test performance is not FDA approved in patients less than 8 years old. Performed at [facility], [place] [address] [place], [place] [address]      Medications:    atorvastatin  40 mg Oral QHS   buPROPion  300 mg Oral Daily   carbamazepine  200 mg Oral TID   carvedilol  12.5 mg Oral BID WC   docusate sodium  100 mg Oral BID   enoxaparin (LOVENOX) injection  40 mg Subcutaneous Daily   isosorbide mononitrate  15 mg Oral Daily   losartan  100 mg Oral Q1500   Continuous Infusions:  lactated ringers 10 mL/hr at 10/09/21 1533   remdesivir 100 mg in NS 100 mL 100 mg (10/10/21 1023)      LOS: 4 days   Charlynne Cousins  [facility]  10/11/2021, 9:42 AM

## 2021-10-11 NOTE — TOC Initial Note (Addendum)
Transition of Care [facility]) - Initial/Assessment Note    Patient Details  Name: [name] MRN: [id number] Date of Birth: [birth year]  [facility] CM/SW Contact:    Bethena Roys, RN Phone Number: 10/11/2021, 12:00 PM  Clinical Narrative:  Case Manager spoke with the patients son regarding plan of care. Patient was previously from home with the sons assistance. Son states he is the patients caregiver. Patient is currently active with [facility] and will resume services at discharge. Case Manager discussed home health services with the son and he is agreeable to services; however feels that the services will need to be delayed for a couple of days to see how the patients delirium is once she gets home. Son did not have a preference for home health agency. Case Manager made a referral to [place] can service the area. No further needs from Case Manager at this time.   1533 10-11-21 Case Manager was able to speak with the son regarding PT visit. Son feels that he will be able to provide care for his mother once she gets home. Son provides 24/7 supervision and states his mother has been total care; however, she could assist some. Son is not agreeable to SNF at this time.   Expected Discharge Plan: [place] Barriers to Discharge: No Barriers Identified   Patient Goals and CMS Choice Patient states their goals for this hospitalization and ongoing recovery are:: son would like the patient to return home   Choice offered to / list presented to : Adult Children  Expected Discharge Plan and Services Expected Discharge Plan: [place]   Discharge Planning Services: CM Consult Post Acute Care Choice: [place] arrangements for the past 2 months: Single Family Home Expected Discharge Date: 10/11/21                         HH Arranged: PT, OT HH Agency: [facility] [facility] Agency  Contacted: 10/11/21 Time [place]: [place] Representative spoke with at [place]: Tommi Rumps  Prior Living Arrangements/Services Living arrangements for the past 2 months: [place] with:: Adult Children Patient language and need for interpreter reviewed:: Yes Do you feel safe going back to the place where you live?: Yes      Need for Family Participation in Patient Care: Yes (Comment) Care giver support system in place?: Yes (comment) Current home services:  ([facility]all[facility] via [facility] Criminal Activity/Legal Involvement Pertinent to Current Situation/Hospitalization: No - Comment as needed  Activities of Daily Living      Permission Sought/Granted Permission sought to share information with : Family Supports, Customer service manager, Case Optician, dispensing granted to share information with : Yes, Verbal Permission Granted     Permission granted to share info w AGENCY: [facility]        Emotional Assessment Appearance:: Appears stated age Attitude/Demeanor/Rapport: Unable to Assess Affect (typically observed): Unable to Assess   Alcohol / Substance Use: Not Applicable Psych Involvement: No (comment)  Admission diagnosis:  COVID-19 virus infection [U07.1] COVID-19 [U07.1] Patient Active Problem List   Diagnosis Date Noted   Hx of BKA, right ([place]) 10/07/2021   Hypokalemia 10/07/2021   AMS (altered mental status) 10/07/2021   Severe sepsis ([place]) 06/29/2021   Community acquired pneumonia 06/29/2021   Pressure injury of skin 06/26/2021   Acute respiratory failure with hypoxia ([place]) 06/25/2021  Acute metabolic encephalopathy 07/30/1172   COVID-19 virus infection 06/25/2021   Prolonged QT interval 06/25/2021   SIRS (systemic inflammatory response syndrome) ([place]) 06/25/2021   Elevated troponin 06/25/2021   Cardiomegaly 06/25/2021   Bedbound 10/13/2018   Cellulitis of right lower extremity    Wound infection 10/12/2018    Subacute osteomyelitis, right ankle and foot ([place])    Neurogenic bladder 06/16/2016   Memory difficulties 12/10/2014   Gait disorder 07/01/2013   Tooth pain 11/02/2011   Protein calorie malnutrition ([place]) 10/07/2011   Constipation 09/23/2011   Failure to thrive in childhood 09/22/2011   Decubitus ulcer, buttock 09/22/2011   Fibroid uterus 09/22/2011   Abdominal pain 09/22/2011   Multiple sclerosis ([place])    Hypertension    Depression    Organic brain syndrome    Trigeminal neuropathy    PCP:  Glenis Smoker, MD Pharmacy:   [id number] [place], [place] [address] [place] [address] Phone: [phone] Fax: [phone]  [facility] [address], [place], [address], [place], [place] [address] Phone: [phone] Fax: [phone]  [place], [place] [address] Phone: [phone] Fax: [phone]  [facility] [address] [place] [address] Phone: [phone] Fax: [phone]  Readmission Risk Interventions No flowsheet data found.

## 2021-10-11 NOTE — Evaluation (Signed)
Physical Therapy Evaluation Patient Details Name: [name] MRN: [id number] DOB: 1946-04-15 Today's Date: 10/11/2021  History of Present Illness  76 yo female presents to [facility] on 12/29 with AMS. Pt found to be covid +, PMH includes MS w/c level, neurogenic bladder with recurrent UTIs, dementia, depression, R BKA.  Clinical Impression   Pt presents with generalized weakness, hypertonicity in extensors especially LLE,  Pt to benefit from acute PT to address deficits. Pt currently requiring total assist for bed mobility, tolerated EOB sitting <1 minute with heavy posterior truncal and LE extensor tone. Unsure of pt baseline, could not reach pt's son via telephone when attempted, CSM and RN notified. If pt is baseline total assist and has constant care, would be okay for d/c home with maximizing [facility] services; otherwise PT would encourage family to pursue ST-SNF to return to baseline prior to d/c home. PT to progress mobility as tolerated, and will continue to follow acutely.         Recommendations for follow up therapy are one component of a multi-disciplinary discharge planning process, led by the attending physician.  Recommendations may be updated based on patient status, additional functional criteria and insurance authorization.  Follow Up Recommendations Skilled nursing-short term rehab (<3 hours/day) (unless son is able to provide 24/7 assist and total care for mother at home, then HHPT and maximize other [facility] services)    Assistance Recommended at Discharge Frequent or constant Supervision/Assistance  Functional Status Assessment Patient has had a recent decline in their functional status and demonstrates the ability to make significant improvements in function in a reasonable and predictable amount of time.  Equipment Recommendations  None recommended by PT    Recommendations for Other Services       Precautions / Restrictions Precautions Precautions: Fall Precaution Comments:  chronic R BKA Restrictions Weight Bearing Restrictions: No      Mobility  Bed Mobility Overal bed mobility: Needs Assistance Bed Mobility: Supine to Sit;Sit to Supine     Supine to sit: Total assist;HOB elevated Sit to supine: Total assist;HOB elevated   General bed mobility comments: total assist for trunk and LE management, scooting to/from EOB, boost up in bed with use of bed pads and trendelenburg function    Transfers                   General transfer comment: nt - pt with poor tolerance for EOB sitting with heavy posterior leaning    Ambulation/Gait                  Stairs            Wheelchair Mobility    Modified Rankin (Stroke Patients Only)       Balance Overall balance assessment: Needs assistance Sitting-balance support: Bilateral upper extremity supported;Feet supported Sitting balance-Leahy Scale: Zero Sitting balance - Comments: max PT assist to support pt at EOB Postural control: Posterior lean                                   Pertinent Vitals/Pain Pain Assessment: Faces Faces Pain Scale: Hurts a little bit Pain Location: does not specify Pain Descriptors / Indicators: Discomfort Pain Intervention(s): Limited activity within patient's tolerance;Monitored during session;Repositioned    Home Living Family/patient expects to be discharged to:: Private residence Living Arrangements: Children Available Help at Discharge: Family Type of Home: House Home Access: Ramped entrance  Home Layout: One level Home Equipment: Wheelchair - Publishing copy (2 wheels);Tub bench;BSC/3in1      Prior Function Prior Level of Function : Needs assist             Mobility Comments: above information obtained from chart review - PT called pt's son, no answer ADLs Comments: Assuming significant/total assist based on eval.     Hand Dominance   Dominant Hand: Right    Extremity/Trunk Assessment   Upper  Extremity Assessment Upper Extremity Assessment: Defer to OT evaluation    Lower Extremity Assessment Lower Extremity Assessment: LLE deficits/detail;Difficult to assess due to impaired cognition LLE Deficits / Details: no active movement observed; PF contracture, hip and knee flexion/extension WFL as assessed via PROM    Cervical / Trunk Assessment Cervical / Trunk Assessment: Normal  Communication   Communication: HOH  Cognition Arousal/Alertness: Awake/alert Behavior During Therapy: WFL for tasks assessed/performed Overall Cognitive Status: History of cognitive impairments - at baseline                                 General Comments: history of dementia and cognitive change associated with MS; pt oriented to self only        General Comments General comments (skin integrity, edema, etc.): vss, pt fidgeting and pulling at lines/leads during session    Exercises     Assessment/Plan    PT Assessment Patient needs continued PT services  PT Problem List Decreased strength;Decreased mobility;Decreased activity tolerance;Decreased knowledge of use of DME;Decreased balance;Decreased safety awareness;Decreased cognition;Pain;Impaired tone       PT Treatment Interventions DME instruction;Therapeutic activities;Therapeutic exercise;Patient/family education;Balance training;Functional mobility training;Neuromuscular re-education    PT Goals (Current goals can be found in the Care Plan section)  Acute Rehab PT Goals PT Goal Formulation: Patient unable to participate in goal setting Time For Goal Achievement: 10/25/21 Potential to Achieve Goals: Good    Frequency Min 3X/week   Barriers to discharge        Co-evaluation               AM-PAC PT "6 Clicks" Mobility  Outcome Measure Help needed turning from your back to your side while in a flat bed without using bedrails?: Total Help needed moving from lying on your back to sitting on the side of a flat  bed without using bedrails?: Total Help needed moving to and from a bed to a chair (including a wheelchair)?: Total Help needed standing up from a chair using your arms (e.g., wheelchair or bedside chair)?: Total Help needed to walk in hospital room?: Total Help needed climbing 3-5 steps with a railing? : Total 6 Click Score: 6    End of Session   Activity Tolerance: Patient limited by fatigue Patient left: in bed;with call bell/phone within reach;with bed alarm set Nurse Communication: Mobility status;Other (comment) (total assist all aspects of mobility) PT Visit Diagnosis: Other abnormalities of gait and mobility (R26.89);Muscle weakness (generalized) (M62.81)    Time: 1610-9604 PT Time Calculation (min) (ACUTE ONLY): 15 min   Charges:   PT Evaluation $PT Eval Low Complexity: 1 Low        Bishop Vanderwerf S, PT DPT Acute Rehabilitation Services Pager [phone]  Office [phone]   [place] E Ruffin Pyo 10/11/2021, 1:05 PM

## 2021-10-11 NOTE — Discharge Summary (Signed)
Physician Discharge Summary  [name]:[id number] DOB: 12-16-1945 DOA: 10/07/2021  PCP: Glenis Smoker, MD  Admit date: 10/07/2021 Discharge date: 10/11/2021  Admitted From: home Disposition:  Home  Recommendations for Outpatient Follow-up:  Follow up with PCP in 1-2 weeks Please obtain BMP/CBC in one week She will go home with home health  Home Health:Yes Equipment/Devices:None  Discharge Condition:Stable CODE STATUS:Full Diet recommendation: Heart Healthy  Brief/Interim Summary: 76 y.o. female past medical history of multiple sclerosis with right-sided weakness, dementia comes into the ED for altered mental status most of the history was obtained by the son who is her care provider at baseline the patient is bedbound not able to ambulate and not able to answer questions, was brought into the ED where she was found to have a fever tachycardic and tachypneic UA was negative was found to be COVID-positive with a white count of 10.  Discharge Diagnoses:  Principal Problem:   COVID-19 virus infection Active Problems:   Multiple sclerosis ([facility])   Hypertension   Hx of BKA, right (HCC)   Hypokalemia   AMS (altered mental status)  Acute metabolic encephalopathy secondary to COVID-19 viral infection: She was started on IV remdesivir and IV fluids.  MRI of the brain showed no acute CVA. Antibiotics were discontinued procalcitonin was low yield. She completed her course in-house. Physical therapy evaluated the patient in the son takes care of her at home she will go home with home health.  Hypokalemia hypomagnesemia: They were repleted orally now resolved.    Essential hypertension: Her Coreg was increased due to elevated blood pressure is now improved she will continue her current regimen along with hydrochlorothiazide and losartan.  Discharge Instructions  Discharge Instructions     Diet - low sodium heart healthy   Complete by: As directed    Increase  activity slowly   Complete by: As directed    No wound care   Complete by: As directed       Allergies as of 10/11/2021       Reactions   Prednisone Other (See Comments)   Caused thrush- will need something to reverse this   Shellfish Allergy Hives      Wound Dressing Adhesive    Splits the skin        Medication List     TAKE these medications    aspirin 81 MG EC tablet 1 tablet   atorvastatin 40 MG tablet Commonly known as: LIPITOR Take 40 mg by mouth at bedtime.   Avonex Pen 30 MCG/0.5ML Ajkt Generic drug: Interferon Beta-1a INJECT 30 MCG INTRAMUSCULARLY ONCE WEEKLY What changed:  how much to take how to take this when to take this   baclofen 10 MG tablet Commonly known as: LIORESAL Take 1 tablet (10 mg total) by mouth 4 (four) times daily.   buPROPion 300 MG 24 hr tablet Commonly known as: WELLBUTRIN XL Take 300 mg by mouth daily.   carbamazepine 200 MG tablet Commonly known as: TEGRETOL TAKE 1 TABLET BY MOUTH 3 TIMES A DAY What changed:  how much to take how to take this when to take this   carvedilol 3.125 MG tablet Commonly known as: COREG Take 1 tablet (3.125 mg total) by mouth 2 (two) times daily with a meal.   DESITIN CLEAR EX Apply 1 application topically daily as needed (for skin protection).   docusate sodium 100 MG capsule Commonly known as: COLACE Take 1 capsule (100 mg total) by mouth 2 (two) times  daily. What changed:  when to take this additional instructions   Fish Oil 600 MG Caps Take 1,200 mg by mouth daily.   hydrochlorothiazide 25 MG tablet Commonly known as: HYDRODIURIL Take 25 mg by mouth at bedtime.   Klor-Con M20 20 MEQ tablet Generic drug: potassium chloride SA Take 20 mEq by mouth 3 (three) times daily.   losartan 100 MG tablet Commonly known as: COZAAR Take 100 mg by mouth daily in the afternoon.   Multivitamin Gummies Adult Chew Chew 2 tablets by mouth every morning.   NON FORMULARY Take 1 tablet by  mouth See admin instructions. Super Greens tablets- Take 1 tablet by mouth once a day   pregabalin 100 MG capsule Commonly known as: LYRICA TAKE 1 CAPSULE BY MOUTH TWICE A DAY   sertraline 50 MG tablet Commonly known as: ZOLOFT Take 75 mg by mouth daily.   traMADol 50 MG tablet Commonly known as: ULTRAM 1/2 tablet prn daily. What changed:  how much to take how to take this when to take this reasons to take this        Follow-up Information     [facility] Follow up.   Why: Palliative Services-office to call with a visit time. Contact information: [place] [address] [phone]                Allergies  Allergen Reactions   Prednisone Other (See Comments)    Caused thrush- will need something to reverse this   Shellfish Allergy Hives        Wound Dressing Adhesive     Splits the skin    Consultations: None   Procedures/Studies: CT HEAD WO CONTRAST (5MM)  Result Date: 10/07/2021 CLINICAL DATA:  Mental status change of unknown cause. History of MS. EXAM: CT HEAD WITHOUT CONTRAST TECHNIQUE: Contiguous axial images were obtained from the base of the skull through the vertex without intravenous contrast. COMPARISON:  01/13/2011 FINDINGS: Brain: Diffuse cerebral atrophy. Ventricular dilatation consistent with central atrophy. Patchy low-attenuation changes in the deep white matter could reflect small vessel ischemic changes or demyelinating changes. No mass effect or midline shift. No abnormal extra-axial fluid collections. Gray-white matter junctions are distinct. Basal cisterns are not effaced. No acute intracranial hemorrhage. Vascular: Moderate intracranial arterial vascular calcifications. Skull: Calvarium appears intact. Sinuses/Orbits: Paranasal sinuses and mastoid air cells are clear. Other: There appears to be progression of cerebral atrophy and white matter changes since the previous study. IMPRESSION: 1. No acute  intracranial abnormalities. 2. Diffuse cerebral atrophy with white matter changes, progressing since prior study. Electronically Signed   By: Lucienne Capers M.D.   On: 10/07/2021 19:33   MR BRAIN WO CONTRAST  Result Date: 10/08/2021 CLINICAL DATA:  Mental status change, unknown cause EXAM: MRI HEAD WITHOUT CONTRAST TECHNIQUE: Multiplanar, multiecho pulse sequences of the brain and surrounding structures were obtained without intravenous contrast. COMPARISON:  2012 FINDINGS: Motion artifact is present. Brain: There is no acute infarction or intracranial hemorrhage. There is no intracranial mass, mass effect, or edema. There is no hydrocephalus or extra-axial fluid collection. Prominence of the ventricles and sulci reflects parenchymal volume loss. Patchy and confluent T2 hyperintensity in the supratentorial periventricular greater than subcortical white matter likely reflect history of multiple sclerosis. Likely related involvement of the cerebellum and brainstem. Some superimposed chronic microvascular ischemic changes are possible. Vascular: Major vessel flow voids at the skull base are preserved. Skull and upper cervical spine: Normal marrow signal is preserved. Sinuses/Orbits: Paranasal sinuses are  aerated. Orbits are unremarkable. Other: Sella is unremarkable.  Mastoid air cells are clear. IMPRESSION: Motion degraded. No acute infarction, hemorrhage, or mass. Sequelae of multiple sclerosis. Electronically Signed   By: Macy Mis M.D.   On: 10/08/2021 15:20   DG Chest Port 1 View  Result Date: 10/07/2021 CLINICAL DATA:  Possible sepsis EXAM: PORTABLE CHEST 1 VIEW COMPARISON:  06/28/2021 FINDINGS: Cardiomegaly. Mild left retrocardiac opacity. Aortic atherosclerosis. No pneumothorax IMPRESSION: 1. Low lung volume with mild left virtual cardiac atelectasis or mild pneumonia 2. Cardiomegaly Electronically Signed   By: Donavan Foil M.D.   On: 10/07/2021 20:47    Subjective: No  complaints  Discharge Exam: Vitals:   10/11/21 0540 10/11/21 0811  BP: (!) 161/56 (!) 144/69  Pulse: 89 93  Resp: 19 (!) 27  Temp: 98.3 F (36.8 C) 98.2 F (36.8 C)  SpO2: 99% 100%   Vitals:   10/10/21 2030 10/10/21 2340 10/11/21 0540 10/11/21 0811  BP: (!) 145/61 (!) 147/63 (!) 161/56 (!) 144/69  Pulse: 89 91 89 93  Resp: (!) 24 (!) 24 19 (!) 27  Temp: 97.6 F (36.4 C) 98.8 F (37.1 C) 98.3 F (36.8 C) 98.2 F (36.8 C)  TempSrc: Axillary Axillary Oral Oral  SpO2: 97% 96% 99% 100%  Weight:      Height:        General: Pt is alert, awake, not in acute distress Cardiovascular: RRR, S1/S2 +, no rubs, no gallops Respiratory: CTA bilaterally, no wheezing, no rhonchi Abdominal: Soft, NT, ND, bowel sounds + Extremities: no edema, no cyanosis    The results of significant diagnostics from this hospitalization (including imaging, microbiology, ancillary and laboratory) are listed below for reference.     Microbiology: Recent Results (from the past 240 hour(s))  Resp Panel by RT-PCR (Flu A&B, Covid) Nasopharyngeal Swab     Status: Abnormal   Collection Time: 10/07/21  6:57 PM   Specimen: Nasopharyngeal Swab; Nasopharyngeal(NP) swabs in vial transport medium  Result Value Ref Range Status   SARS Coronavirus 2 by RT PCR POSITIVE (A) NEGATIVE Final    Comment: (NOTE) SARS-CoV-2 target nucleic acids are DETECTED.  The SARS-CoV-2 RNA is generally detectable in upper respiratory specimens during the acute phase of infection. Positive results are indicative of the presence of the identified virus, but do not rule out bacterial infection or co-infection with other pathogens not detected by the test. Clinical correlation with patient history and other diagnostic information is necessary to determine patient infection status. The expected result is Negative.  Fact Sheet for Patients: EntrepreneurPulse.com.au  Fact Sheet for Healthcare  Providers: IncredibleEmployment.be  This test is not yet approved or cleared by the [place] FDA and  has been authorized for detection and/or diagnosis of SARS-CoV-2 by FDA under an Emergency Use Authorization (EUA).  This EUA will remain in effect (meaning this test can be used) for the duration of  the COVID-19 declaration under Section 564(b)(1) of the A ct, 21 U.S.C. section 360bbb-3(b)(1), unless the authorization is terminated or revoked sooner.     Influenza A by PCR NEGATIVE NEGATIVE Final   Influenza B by PCR NEGATIVE NEGATIVE Final    Comment: (NOTE) The Xpert Xpress SARS-CoV-2/FLU/RSV plus assay is intended as an aid in the diagnosis of influenza from Nasopharyngeal swab specimens and should not be used as a sole basis for treatment. Nasal washings and aspirates are unacceptable for Xpert Xpress SARS-CoV-2/FLU/RSV testing.  Fact Sheet for Patients: EntrepreneurPulse.com.au  Fact Sheet for Healthcare Providers:  IncredibleEmployment.be  This test is not yet approved or cleared by the [place] and has been authorized for detection and/or diagnosis of SARS-CoV-2 by FDA under an Emergency Use Authorization (EUA). This EUA will remain in effect (meaning this test can be used) for the duration of the COVID-19 declaration under Section 564(b)(1) of the Act, 21 U.S.C. section 360bbb-3(b)(1), unless the authorization is terminated or revoked.  Performed at [facility], [place] [address], [place], [place] [address]   Urine Culture     Status: Abnormal   Collection Time: 10/07/21  6:57 PM   Specimen: In/Out Cath Urine  Result Value Ref Range Status   Specimen Description IN/OUT CATH URINE  Final   Special Requests   Final    NONE Performed at [facility], [place] [address] [place], [place] [address]    Culture 200 COLONIES/mL ENTEROCOCCUS FAECALIS (A)  Final   Report Status 10/10/2021 FINAL   Final   Organism ID, Bacteria ENTEROCOCCUS FAECALIS (A)  Final      Susceptibility   Enterococcus faecalis - MIC*    AMPICILLIN <=2 SENSITIVE Sensitive     NITROFURANTOIN <=16 SENSITIVE Sensitive     VANCOMYCIN 1 SENSITIVE Sensitive     * 200 COLONIES/mL ENTEROCOCCUS FAECALIS  Blood Culture (routine x 2)     Status: None (Preliminary result)   Collection Time: 10/07/21  7:48 PM   Specimen: BLOOD  Result Value Ref Range Status   Specimen Description BLOOD RIGHT ANTECUBITAL  Final   Special Requests   Final    BOTTLES DRAWN AEROBIC AND ANAEROBIC Blood Culture results may not be optimal due to an inadequate volume of blood received in culture bottles   Culture   Final    NO GROWTH 4 DAYS Performed at [facility], [place] [address] [place], [place] [address]    Report Status PENDING  Incomplete  Blood Culture (routine x 2)     Status: None (Preliminary result)   Collection Time: 10/08/21  5:13 AM   Specimen: BLOOD  Result Value Ref Range Status   Specimen Description BLOOD RIGHT HAND  Final   Special Requests   Final    BOTTLES DRAWN AEROBIC AND ANAEROBIC Blood Culture adequate volume   Culture   Final    NO GROWTH 3 DAYS Performed at [facility], [place] [address], [place], [place] [address]    Report Status PENDING  Incomplete  MRSA Next Gen by PCR, Nasal     Status: None   Collection Time: 10/08/21  8:10 AM   Specimen: Nasal Mucosa; Nasal Swab  Result Value Ref Range Status   MRSA by PCR Next Gen NOT DETECTED NOT DETECTED Final    Comment: (NOTE) The GeneXpert MRSA Assay (FDA approved for NASAL specimens only), is one component of a comprehensive MRSA colonization surveillance program. It is not intended to diagnose MRSA infection nor to guide or monitor treatment for MRSA infections. Test performance is not FDA approved in patients less than 74 years old. Performed at [facility], [place] [address], [place], [place] [address]      Labs: BNP (last 3  results) Recent Labs    06/25/21 0821  BNP 67.5   Basic Metabolic Panel: Recent Labs  Lab 10/07/21 1947 10/08/21 0514 10/09/21 0245 10/10/21 0318  NA 139 140 138  --   K 3.3* 3.0* 3.1*  --   CL 106 107 106  --   CO2  23 24 21*  --   GLUCOSE 117* 105* 106*  --   BUN 9 7* 12  --   CREATININE 0.60 0.53 0.47  --   CALCIUM 9.3 9.0 8.9  --   MG  --  1.5*  --  2.1   Liver Function Tests: Recent Labs  Lab 10/07/21 1947  AST 27  ALT 17  ALKPHOS 116  BILITOT 0.5  PROT 7.2  ALBUMIN 3.7   No results for input(s): LIPASE, AMYLASE in the last 168 hours. No results for input(s): AMMONIA in the last 168 hours. CBC: Recent Labs  Lab 10/07/21 1947 10/09/21 0245 10/10/21 0318 10/11/21 0412  WBC 10.8* 6.5 6.7 5.5  NEUTROABS 9.1* 3.9 3.8 2.6  HGB 12.7 11.3* 11.7* 10.0*  HCT 39.7 33.8* 34.5* 30.2*  MCV 95.0 92.1 91.5 94.4  PLT 234 199 247 181   Cardiac Enzymes: No results for input(s): CKTOTAL, CKMB, CKMBINDEX, TROPONINI in the last 168 hours. BNP: Invalid input(s): POCBNP CBG: Recent Labs  Lab 10/07/21 1841  GLUCAP 121*   D-Dimer No results for input(s): DDIMER in the last 72 hours. Hgb A1c No results for input(s): HGBA1C in the last 72 hours. Lipid Profile No results for input(s): CHOL, HDL, LDLCALC, TRIG, CHOLHDL, LDLDIRECT in the last 72 hours. Thyroid function studies No results for input(s): TSH, T4TOTAL, T3FREE, THYROIDAB in the last 72 hours.  Invalid input(s): FREET3 Anemia work up Recent Labs    10/09/21 0245  FERRITIN 87   Urinalysis    Component Value Date/Time   COLORURINE YELLOW 10/07/2021 2112   APPEARANCEUR CLEAR 10/07/2021 2112   APPEARANCEUR Cloudy (A) 02/04/2014 1313   LABSPEC 1.016 10/07/2021 2112   [place] 5.0 10/07/2021 2112   GLUCOSEU NEGATIVE 10/07/2021 2112   HGBUR NEGATIVE 10/07/2021 2112   BILIRUBINUR NEGATIVE 10/07/2021 2112   BILIRUBINUR Negative 02/04/2014 [place] 10/07/2021 2112   PROTEINUR NEGATIVE  10/07/2021 2112   UROBILINOGEN 2.0 (H) 09/28/2011 1846   NITRITE NEGATIVE 10/07/2021 2112   LEUKOCYTESUR NEGATIVE 10/07/2021 2112   Sepsis Labs Invalid input(s): PROCALCITONIN,  WBC,  LACTICIDVEN Microbiology Recent Results (from the past 240 hour(s))  Resp Panel by RT-PCR (Flu A&B, Covid) Nasopharyngeal Swab     Status: Abnormal   Collection Time: 10/07/21  6:57 PM   Specimen: Nasopharyngeal Swab; Nasopharyngeal(NP) swabs in vial transport medium  Result Value Ref Range Status   SARS Coronavirus 2 by RT PCR POSITIVE (A) NEGATIVE Final    Comment: (NOTE) SARS-CoV-2 target nucleic acids are DETECTED.  The SARS-CoV-2 RNA is generally detectable in upper respiratory specimens during the acute phase of infection. Positive results are indicative of the presence of the identified virus, but do not rule out bacterial infection or co-infection with other pathogens not detected by the test. Clinical correlation with patient history and other diagnostic information is necessary to determine patient infection status. The expected result is Negative.  Fact Sheet for Patients: EntrepreneurPulse.com.au  Fact Sheet for Healthcare Providers: IncredibleEmployment.be  This test is not yet approved or cleared by the [place] FDA and  has been authorized for detection and/or diagnosis of SARS-CoV-2 by FDA under an Emergency Use Authorization (EUA).  This EUA will remain in effect (meaning this test can be used) for the duration of  the COVID-19 declaration under Section 564(b)(1) of the A ct, 21 U.S.C. section 360bbb-3(b)(1), unless the authorization is terminated or revoked sooner.     Influenza A by PCR NEGATIVE NEGATIVE Final   Influenza  B by PCR NEGATIVE NEGATIVE Final    Comment: (NOTE) The Xpert Xpress SARS-CoV-2/FLU/RSV plus assay is intended as an aid in the diagnosis of influenza from Nasopharyngeal swab specimens and should not be used as a  sole basis for treatment. Nasal washings and aspirates are unacceptable for Xpert Xpress SARS-CoV-2/FLU/RSV testing.  Fact Sheet for Patients: EntrepreneurPulse.com.au  Fact Sheet for Healthcare Providers: IncredibleEmployment.be  This test is not yet approved or cleared by the [place] FDA and has been authorized for detection and/or diagnosis of SARS-CoV-2 by FDA under an Emergency Use Authorization (EUA). This EUA will remain in effect (meaning this test can be used) for the duration of the COVID-19 declaration under Section 564(b)(1) of the Act, 21 U.S.C. section 360bbb-3(b)(1), unless the authorization is terminated or revoked.  Performed at [facility], [place] [address], [place], [place] [address]   Urine Culture     Status: Abnormal   Collection Time: 10/07/21  6:57 PM   Specimen: In/Out Cath Urine  Result Value Ref Range Status   Specimen Description IN/OUT CATH URINE  Final   Special Requests   Final    NONE Performed at [facility], [place] [address], [place], [place] [address]    Culture 200 COLONIES/mL ENTEROCOCCUS FAECALIS (A)  Final   Report Status 10/10/2021 FINAL  Final   Organism ID, Bacteria ENTEROCOCCUS FAECALIS (A)  Final      Susceptibility   Enterococcus faecalis - MIC*    AMPICILLIN <=2 SENSITIVE Sensitive     NITROFURANTOIN <=16 SENSITIVE Sensitive     VANCOMYCIN 1 SENSITIVE Sensitive     * 200 COLONIES/mL ENTEROCOCCUS FAECALIS  Blood Culture (routine x 2)     Status: None (Preliminary result)   Collection Time: 10/07/21  7:48 PM   Specimen: BLOOD  Result Value Ref Range Status   Specimen Description BLOOD RIGHT ANTECUBITAL  Final   Special Requests   Final    BOTTLES DRAWN AEROBIC AND ANAEROBIC Blood Culture results may not be optimal due to an inadequate volume of blood received in culture bottles   Culture   Final    NO GROWTH 4 DAYS Performed at [facility], [place] [address],  [place], [place] [address]    Report Status PENDING  Incomplete  Blood Culture (routine x 2)     Status: None (Preliminary result)   Collection Time: 10/08/21  5:13 AM   Specimen: BLOOD  Result Value Ref Range Status   Specimen Description BLOOD RIGHT HAND  Final   Special Requests   Final    BOTTLES DRAWN AEROBIC AND ANAEROBIC Blood Culture adequate volume   Culture   Final    NO GROWTH 3 DAYS Performed at [facility], [place] [address], [place], [place] [address]    Report Status PENDING  Incomplete  MRSA Next Gen by PCR, Nasal     Status: None   Collection Time: 10/08/21  8:10 AM   Specimen: Nasal Mucosa; Nasal Swab  Result Value Ref Range Status   MRSA by PCR Next Gen NOT DETECTED NOT DETECTED Final    Comment: (NOTE) The GeneXpert MRSA Assay (FDA approved for NASAL specimens only), is one component of a comprehensive MRSA colonization surveillance program. It is not intended to diagnose MRSA infection nor to guide or monitor treatment for MRSA infections. Test performance is not FDA approved in patients less than 10 years old. Performed at [facility], [place],  [place] [address]    SIGNED:   Charlynne Cousins, MD  [facility] 10/11/2021, 9:51 AM Pager   If 7PM-7AM, please contact night-coverage [email or web address] Password [email or web address]

## 2021-10-11 NOTE — TOC Initial Note (Signed)
Transition of Care [facility]) - Initial/Assessment Note    Patient Details  Name: [name] MRN: [id number] Date of Birth: [birth year]  Transition of Care [facility]) CM/SW Contact:    Milas Gain, [place] Phone Number: 10/11/2021, 3:13 PM  Clinical Narrative:                  CSW received consult for possible SNF placement at time of discharge. CSW and CM spoke with patients son [name] regarding PT recommendation of SNF placement at time of discharge.  Patients son  expressed understanding of PT recommendation and declined SNF placement at time of discharge. [name] confirmed he can provide 24/7 supervision for patient at home.[name] confirmed he would like to take patient home with [facility] services.All questions answered.No further questions reported at this time. CM confirmed will assist with patients dc planning needs.  Expected Discharge Plan: [place] Barriers to Discharge: No Barriers Identified   Patient Goals and CMS Choice Patient states their goals for this hospitalization and ongoing recovery are:: son would like the patient to return home   Choice offered to / list presented to : Adult Children  Expected Discharge Plan and Services Expected Discharge Plan: [place]   Discharge Planning Services: CM Consult Post Acute Care Choice: [place] arrangements for the past 2 months: Single Family Home Expected Discharge Date: 10/11/21                         HH Arranged: PT, OT HH Agency: [place] Date [facility] Agency Contacted: 10/11/21 Time [place]: [place] Representative spoke with at [place]: Tommi Rumps  Prior Living Arrangements/Services Living arrangements for the past 2 months: [place] with:: Adult Children Patient language and need for interpreter reviewed:: Yes Do you feel safe going back to the place where you live?: Yes      Need for Family Participation in Patient Care: Yes (Comment) Care  giver support system in place?: Yes (comment) Current home services:  (Palliative Services via [facility] Criminal Activity/Legal Involvement Pertinent to Current Situation/Hospitalization: No - Comment as needed  Activities of Daily Living      Permission Sought/Granted Permission sought to share information with : Family Supports, Customer service manager, Case Optician, dispensing granted to share information with : Yes, Verbal Permission Granted     Permission granted to share info w AGENCY: [place]        Emotional Assessment Appearance:: Appears stated age Attitude/Demeanor/Rapport: Unable to Assess Affect (typically observed): Unable to Assess   Alcohol / Substance Use: Not Applicable Psych Involvement: No (comment)  Admission diagnosis:  COVID-19 virus infection [U07.1] COVID-19 [U07.1] Patient Active Problem List   Diagnosis Date Noted   Hx of BKA, right ([place]) 10/07/2021   Hypokalemia 10/07/2021   AMS (altered mental status) 10/07/2021   Severe sepsis ([place]) 06/29/2021   Community acquired pneumonia 06/29/2021   Pressure injury of skin 06/26/2021   Acute respiratory failure with hypoxia ([place]) 83/41/9622   Acute metabolic encephalopathy 29/79/8921   COVID-19 virus infection 06/25/2021   Prolonged QT interval 06/25/2021   SIRS (systemic inflammatory response syndrome) ([place]) 06/25/2021   Elevated troponin 06/25/2021   Cardiomegaly 06/25/2021   Bedbound 10/13/2018   Cellulitis of right lower extremity    Wound infection 10/12/2018   Subacute osteomyelitis, right ankle and foot ([place])    Neurogenic bladder 06/16/2016   Memory difficulties 12/10/2014  Gait disorder 07/01/2013   Tooth pain 11/02/2011   Protein calorie malnutrition ([place]) 10/07/2011   Constipation 09/23/2011   Failure to thrive in childhood 09/22/2011   Decubitus ulcer, buttock 09/22/2011   Fibroid uterus 09/22/2011   Abdominal pain 09/22/2011   Multiple sclerosis ([place])     Hypertension    Depression    Organic brain syndrome    Trigeminal neuropathy    PCP:  Glenis Smoker, MD Pharmacy:   [id number] [place], [place] [address] [place] [address] Phone: [phone] Fax: [phone]  [place] [address], [place], [place], [place], [place] [address] Phone: [phone] Fax: [phone]  [place], [place] [address] Phone: [phone] Fax: [phone]  [facility] [address] [place] [address] Phone: [phone] Fax: [phone]     Social Determinants of Health (SDOH) Interventions    Readmission Risk Interventions No flowsheet data found.

## 2021-10-12 LAB — CULTURE, BLOOD (ROUTINE X 2): Culture: NO GROWTH

## 2021-10-13 LAB — CULTURE, BLOOD (ROUTINE X 2)
Culture: NO GROWTH
Special Requests: ADEQUATE

## 2021-10-20 DIAGNOSIS — G8191 Hemiplegia, unspecified affecting right dominant side: Secondary | ICD-10-CM | POA: Diagnosis not present

## 2021-10-20 DIAGNOSIS — I10 Essential (primary) hypertension: Secondary | ICD-10-CM | POA: Diagnosis not present

## 2021-10-20 DIAGNOSIS — G319 Degenerative disease of nervous system, unspecified: Secondary | ICD-10-CM | POA: Diagnosis not present

## 2021-10-20 DIAGNOSIS — F09 Unspecified mental disorder due to known physiological condition: Secondary | ICD-10-CM | POA: Diagnosis not present

## 2021-10-20 DIAGNOSIS — G35 Multiple sclerosis: Secondary | ICD-10-CM | POA: Diagnosis not present

## 2021-10-20 DIAGNOSIS — U071 COVID-19: Secondary | ICD-10-CM | POA: Diagnosis not present

## 2021-10-20 DIAGNOSIS — F32A Depression, unspecified: Secondary | ICD-10-CM | POA: Diagnosis not present

## 2021-10-20 DIAGNOSIS — F0283 Dementia in other diseases classified elsewhere, unspecified severity, with mood disturbance: Secondary | ICD-10-CM | POA: Diagnosis not present

## 2021-10-20 DIAGNOSIS — G9341 Metabolic encephalopathy: Secondary | ICD-10-CM | POA: Diagnosis not present

## 2021-10-21 DIAGNOSIS — K5900 Constipation, unspecified: Secondary | ICD-10-CM | POA: Diagnosis not present

## 2021-10-21 DIAGNOSIS — R410 Disorientation, unspecified: Secondary | ICD-10-CM | POA: Diagnosis not present

## 2021-11-03 ENCOUNTER — Telehealth: Payer: Self-pay | Admitting: Neurology

## 2021-11-03 MED ORDER — AVONEX PEN 30 MCG/0.5ML IM AJKT: AUTO-INJECTOR | INTRAMUSCULAR | 1 refills | Status: DC

## 2021-11-03 NOTE — Telephone Encounter (Signed)
E-script has been sent for refill.

## 2021-11-03 NOTE — Telephone Encounter (Signed)
Pt is requesting a refill for Interferon Beta-1a (AVONEX PEN) 30 MCG/0.5ML AJKT.  Pharmacy: [facility]

## 2021-11-09 ENCOUNTER — Ambulatory Visit: Payer: Medicare HMO | Admitting: Neurology

## 2021-11-09 ENCOUNTER — Other Ambulatory Visit: Payer: Self-pay

## 2021-11-09 ENCOUNTER — Encounter: Payer: Self-pay | Admitting: Neurology

## 2021-11-09 VITALS — BP 140/68 | HR 63 | Ht 65.0 in

## 2021-11-09 DIAGNOSIS — G35 Multiple sclerosis: Secondary | ICD-10-CM

## 2021-11-09 DIAGNOSIS — R269 Unspecified abnormalities of gait and mobility: Secondary | ICD-10-CM

## 2021-11-09 DIAGNOSIS — G500 Trigeminal neuralgia: Secondary | ICD-10-CM | POA: Diagnosis not present

## 2021-11-09 NOTE — Progress Notes (Signed)
PATIENT: [name] DOB: 1946/05/04  REASON FOR VISIT: follow up for MS, TN  HISTORY FROM: patient and son, [name] Primary Neurologist: [name] since [name] retired    Today 11/09/21 [name] for follow-up:  Hospitalized for COVID December, D/C 10/11/21, AMS, fever, tachycardic, delivered COVID delirium, since then is lethargic, drooling, less verbal response. MRI brain was negative for acute event. Had COVID also in September 2022.   Has MS, on Avonex, has missed 2 recent doses. Avonex is on the way from the mail. She is non-ambulatory.   Has right TN, not bothersome right now. In hospital held her baclofen and Lyrica, she was more alert, when she was d/c she was talking, eating. [name] has reduced the Lyrica 100 mg daily, Baclofen 10 mg 3 times daily, no tramadol lately, carbamazepine 200 mg 3 times daily.   Memory, stopped Aricept per PCP.   Stopped PT, didn't think there was anything to do. Is waiting ST. Has poor appetite. [name], her son caring for her at home, had an aide for bathing in the AM.   Update 04/29/2021 SS: [name] is a 76 year old female here today for follow-up for MS.  She has significant dementia, is on Avonex for MS.  Has trigeminal neuralgia affecting the right face, on Lyrica and carbamazepine.  Lamictal was added at last visit.  Her son reports Lamictal 25 twice a day was too sedating, She is only taking 25 mg daily.  Her facial pain has been well controlled.  He feels she is overmedicated, drowsy, does not have much verbal conversation.  She is currently dealing with a bedsore to her sacral area.  She has a home health aide who comes out.  Is nonambulatory, is wheelchair-bound. At 1 point able to reduce Lyrica 75 mg twice daily, at that dosing she was more alert and interactive.  She is rarely taking tramadol.  She remains on baclofen and Aricept.  Here today accompanied by her son, [name].  HISTORY  01/04/21 [name]: [name] is a 76 year old  right-handed black female with a history of multiple sclerosis with end-stage features.  The patient is on Avonex, she has a significant dementia.  She has a problem with severe trigeminal neuralgia affecting the right face, she has had worsening of her symptoms over the last several weeks.  She has had gamma knife treatments in the past that have not been very effective.  Carbamazepine has been the most effective medication, Lyrica has been added to this but seems to result in a lot of drowsiness but it does help control the pain.  The patient is now eating soft foods because of the pain.  She has not had recent falls but she does not ambulate.  Her son comes in with her today, he is taking care of her on a regular basis.  The patient requires assistance with bathing and dressing.  REVIEW OF SYSTEMS: Out of a complete 14 system review of symptoms, the patient complains only of the following symptoms, and all other reviewed systems are negative.   See HPI  ALLERGIES: Allergies  Allergen Reactions   Prednisone Other (See Comments)    Caused thrush- will need something to reverse this   Shellfish Allergy Hives        Wound Dressing Adhesive     Splits the skin    HOME MEDICATIONS: Outpatient Medications Prior to Visit  Medication Sig Dispense Refill   aspirin 81 MG EC tablet 1 tablet  atorvastatin (LIPITOR) 40 MG tablet Take 40 mg by mouth at bedtime.     baclofen (LIORESAL) 10 MG tablet Take 1 tablet (10 mg total) by mouth 4 (four) times daily. 360 tablet 1   buPROPion (WELLBUTRIN XL) 300 MG 24 hr tablet Take 300 mg by mouth daily.     carbamazepine (TEGRETOL) 200 MG tablet TAKE 1 TABLET BY MOUTH 3 TIMES A DAY (Patient taking differently: Take 200 mg by mouth 3 (three) times daily. TAKE 1 TABLET BY MOUTH 3 TIMES A DAY) 270 tablet 1   carvedilol (COREG) 12.5 MG tablet Take 1 tablet (12.5 mg total) by mouth 2 (two) times daily with a meal. 60 tablet 3   CRANBERRY PO Take 1 capsule by mouth  daily. 4200 mg     Diaper Rash Products (DESITIN CLEAR EX) Apply 1 application topically daily as needed (for skin protection).     docusate sodium (COLACE) 100 MG capsule Take 1 capsule (100 mg total) by mouth 2 (two) times daily. (Patient taking differently: Take 100 mg by mouth See admin instructions. Take 100 mg by mouth three to four times a week) 10 capsule 0   hydrochlorothiazide (HYDRODIURIL) 25 MG tablet Take 25 mg by mouth at bedtime.     Interferon Beta-1a (AVONEX PEN) 30 MCG/0.5ML AJKT INJECT 30 MCG INTRAMUSCULARLY ONCE WEEKLY 12 each 1   KLOR-CON M20 20 MEQ tablet Take 20 mEq by mouth 3 (three) times daily.     losartan (COZAAR) 100 MG tablet Take 100 mg by mouth daily in the afternoon.     Multiple Vitamins-Minerals (MULTIVITAMIN GUMMIES ADULT) CHEW Chew 2 tablets by mouth every morning.     NON FORMULARY Take 1 tablet by mouth See admin instructions. Super Greens tablets- Take 1 tablet by mouth once a day     Omega-3 Fatty Acids (FISH OIL) 600 MG CAPS Take 1,200 mg by mouth daily.     pregabalin (LYRICA) 100 MG capsule TAKE 1 CAPSULE BY MOUTH TWICE A DAY 180 capsule 1   sertraline (ZOLOFT) 50 MG tablet Take 75 mg by mouth daily.     traMADol (ULTRAM) 50 MG tablet 1/2 tablet prn daily. (Patient taking differently: Take 50 mg by mouth daily as needed for moderate pain. 1/2 tablet prn daily.) 30 tablet 0   No facility-administered medications prior to visit.    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 trigeminal neuralgia Right    left leg DVT Left    STRABISMUS SURGERY     Right eye     FAMILY HISTORY: Family History  Problem  Relation Age of Onset   Multiple sclerosis Sister    Cervical cancer Mother    Liver disease Father    Hyperlipidemia Brother    Pancreatitis Brother    Hypertension Other    Heart disease Other    Uterine cancer Other     SOCIAL HISTORY: Social History   Socioeconomic History   Marital status: Divorced    Spouse name: Not on file   Number of children: 2   Years of education: 16   Highest education level: Not on file  Occupational History   Occupation: disabled Education officer, museum  Tobacco Use   Smoking status: Never   Smokeless tobacco: Never  Vaping Use   Vaping Use: Never used  Substance and Sexual Activity   Alcohol use: No   Drug use: No   Sexual activity: Not Currently    Birth control/protection: Post-menopausal  Other Topics Concern   Not on file  Social History Narrative   Divorced, has a son and daughter. [name] is very involved in her care. [phone]   Living with her son, [name] as FT caregiver.  02-09-15 ssy      Patient drinks about 2 sodas daily.   Patient is right handed.    Social Determinants of Health   Financial Resource Strain: Not on file  Food Insecurity: Not on file  Transportation Needs: Not on file  Physical Activity: Not on file  Stress: Not on file  Social Connections: Not on file  Intimate Partner Violence: Not on file   PHYSICAL EXAM  Vitals:   11/09/21 1320  BP: 140/68  Pulse: 63  Height: 5\' 5"  (1.651 m)    Body mass index is 24.61 kg/m.  Generalized: Frail, elderly female Neurological examination   Mentation: Alert, limited ability to follow commands, is drowsy, provides limited verbal response, history provided by her son, speech is soft and slurred Cranial nerve II-XII: Pupils were equal round reactive to light. Extraocular movements were full, visual field were full on confrontational test.  Motor: Significant weakness of right upper extremity, 3/5 left upper extremity and hand grip, limited use of the right upper extremity, prostatic right lower extremity,3/5 strength of left lower extremity Sensory: Sensory testing is intact to soft touch on all 4 extremities. No evidence of extinction is noted.  Coordination: Finger-nose-finger on the right is difficult, cannot perform on the right Gait and station: Wheelchair-bound, is nonambulatory  DIAGNOSTIC DATA (LABS, IMAGING, TESTING) - I reviewed patient records, labs, notes, testing and imaging myself where available.  Lab Results  Component Value Date   WBC 5.5 10/11/2021   HGB 10.0 (L) 10/11/2021   HCT 30.2 (L) 10/11/2021   MCV 94.4 10/11/2021   PLT 181 10/11/2021      Component Value Date/Time   NA 143 10/11/2021 0412   NA 146 (H) 04/29/2021 1356   K 3.9 10/11/2021 0412   CL 111 10/11/2021 0412   CO2 23 10/11/2021 0412   GLUCOSE 96 10/11/2021 0412   BUN 16 10/11/2021 0412   BUN 17 04/29/2021 1356   CREATININE 0.39 (L) 10/11/2021 0412   CALCIUM 9.2 10/11/2021 0412   PROT 7.2 10/07/2021 1947   PROT 6.7 04/29/2021 1356   ALBUMIN 3.7 10/07/2021 1947   ALBUMIN 4.0 04/29/2021 1356   AST 27 10/07/2021 1947   ALT 17 10/07/2021 1947   ALKPHOS 116 10/07/2021 1947   BILITOT 0.5 10/07/2021 1947   BILITOT 0.2 04/29/2021 1356   GFRNONAA >60 10/11/2021 4401  GFRAA 103 06/16/2020 1421   Lab Results  Component Value Date   CHOL 175 06/26/2021   HDL 45 06/26/2021   LDLCALC 107 (H) 06/26/2021   TRIG 117 06/26/2021   CHOLHDL 3.9 06/26/2021   No results found for: HGBA1C Lab Results  Component Value Date   VITAMINB12 1,624  (H) 10/12/2018   Lab Results  Component Value Date   TSH 1.178 06/25/2021      ASSESSMENT AND PLAN 76 y.o. year old female  has a past medical history of Abnormality of gait, Dementia ([place]), Depression, DVT (deep venous thrombosis) ([place]), Dyslipidemia, Fibroids, Hypertension, Memory difficulties (12/10/2014), Multiple sclerosis ([place]), Neurogenic bladder, Organic brain syndrome, Overdose of muscle relaxant, Trigeminal neuralgia, and Wound infection (10/12/2018). here with:  1.  Multiple sclerosis 2.  Right trigeminal neuralgia 3.  Dementia 4.  Gait disorder  -Decline since having COVID in Dec, now more lethargic; is non-ambulatory/wheelchair bound at baseline -Recommend discontinuing Lyrica for TN pain due to lethargy, potentially overmedicated? will continue carbamazepine, have suggested limiting use of tramadol for only significant pain flares, try Tylenol instead  -Check CMP, carbamazepine level today -For now, will remain on Avonex, however unclear how much benefit this is doing  -Continue baclofen for spasticity -Follow-up with home health ST evaluation given drooling -Return back in 4 to 6 months with [name] since [name] has retired  Butler Denmark, AGNP-C, [place] 11/09/2021, 3:02 PM [facility] [address], [place], [place] [address] [phone]

## 2021-11-09 NOTE — Patient Instructions (Addendum)
Try stopping the Lyrica to see how her TN pain does Will check labs today  Would hold the Tramadol unless needed

## 2021-11-10 ENCOUNTER — Telehealth: Payer: Self-pay | Admitting: *Deleted

## 2021-11-10 LAB — COMPREHENSIVE METABOLIC PANEL
ALT: 10 IU/L (ref 0–32)
AST: 13 IU/L (ref 0–40)
Albumin/Globulin Ratio: 1.5 (ref 1.2–2.2)
Albumin: 3.8 g/dL (ref 3.7–4.7)
Alkaline Phosphatase: 115 IU/L (ref 44–121)
BUN/Creatinine Ratio: 17 (ref 12–28)
BUN: 12 mg/dL (ref 8–27)
Bilirubin Total: 0.2 mg/dL (ref 0.0–1.2)
CO2: 24 mmol/L (ref 20–29)
Calcium: 9.5 mg/dL (ref 8.7–10.3)
Chloride: 104 mmol/L (ref 96–106)
Creatinine, Ser: 0.69 mg/dL (ref 0.57–1.00)
Globulin, Total: 2.5 g/dL (ref 1.5–4.5)
Glucose: 99 mg/dL (ref 70–99)
Potassium: 4.7 mmol/L (ref 3.5–5.2)
Sodium: 145 mmol/L — ABNORMAL HIGH (ref 134–144)
Total Protein: 6.3 g/dL (ref 6.0–8.5)
eGFR: 92 mL/min/{1.73_m2} (ref >59)

## 2021-11-10 LAB — CARBAMAZEPINE LEVEL, TOTAL: Carbamazepine (Tegretol), S: 11.2 ug/mL (ref 4.0–12.0)

## 2021-11-10 NOTE — Telephone Encounter (Signed)
-----   Message from Suzzanne Cloud, NP sent at 11/10/2021  8:47 AM EST ----- Please call the patient, carbamazepine level is within normal limits at 11.2, but is on the higher end of normal, if she does well with elimination of Lyrica, we may consider dose reduction of carbamazepine if her TN pain continues to be well controlled. Sodium level was mildly high at 145 suggestive of dehydration, her son has been working on making she is has adequate intake.

## 2021-11-10 NOTE — Telephone Encounter (Signed)
I spoke to her son on [place]. He verbalized understanding of the lab findings. He will continue working to keep her well hydrated. He will keep [place] updated on her trigeminal neuralgia pain. Agreeable that if it continues to be well controlled, then will plan to try further reductions in medication.

## 2021-11-10 NOTE — Progress Notes (Signed)
Chart reviewed, agree above plan ?

## 2021-11-15 ENCOUNTER — Other Ambulatory Visit: Payer: Self-pay

## 2021-11-15 ENCOUNTER — Other Ambulatory Visit: Payer: Medicare HMO | Admitting: Hospice

## 2021-11-15 DIAGNOSIS — F0390 Unspecified dementia without behavioral disturbance: Secondary | ICD-10-CM

## 2021-11-15 DIAGNOSIS — G35 Multiple sclerosis: Secondary | ICD-10-CM

## 2021-11-15 DIAGNOSIS — U071 COVID-19: Secondary | ICD-10-CM

## 2021-11-15 DIAGNOSIS — Z515 Encounter for palliative care: Secondary | ICD-10-CM

## 2021-11-15 DIAGNOSIS — I10 Essential (primary) hypertension: Secondary | ICD-10-CM

## 2021-11-15 NOTE — Progress Notes (Signed)
[facility] Palliative Care Consult Note Telephone: [phone]  Fax: [phone]  PATIENT NAME: [name] DOB: 05-26-1946 MRN: [id number]  PRIMARY CARE PROVIDER:   Glenis Smoker, MD Glenis Smoker, MD [place],  [place] [address]  REFERRING PROVIDER: Glenis Smoker, MD Glenis Smoker, MD [address] [place],  [place] [address]  RESPONSIBLE PARTY:  Carson Myrtle to call cell Contact Information     Name Relation Home Work Browning Son [phone]  [phone]      TELEHEALTH VISIT STATEMENT Due to the COVID-19 crisis, this visit was done via telemedicine and it was initiated and consent by this patient and or family. Video-audio (telehealth) contact was unable to be done due to technical barriers from the patients side.   Visit is to build trust and highlight Palliative Medicine as specialized medical care for people living with serious illness, aimed at facilitating better quality of life through symptoms relief, assisting with advance care planning and complex medical decision making. [name] is present with patient during visit. This is a follow up visit.  RECOMMENDATIONS/PLAN:   Advance Care Planning/Code Status:  Patient is a Full code.  Goals of Care: Goals of care include to maximize quality of life and symptom management.  Visit consisted of counseling and education dealing with the complex and emotionally intense issues of symptom management and palliative care in the setting of serious and potentially life-threatening illness. Palliative care team will continue to support patient, patient's family, and medical team.  Symptom management/Plan:  COVID-19 Viral infection: Completed  IV remdesivir and IV fluids in the hospital. No more respiratory distress, not on oxygen supplementation. Patient is COVID vaccinated and boosted. Continue COVID - 19 precautions as  discussed.  Dementia: memory loss and confusion progressive in line with Dementia disease trajectory. FAST 7a. Encourage reminiscence, word search/puzzles, cueing for recollection.  Promote calm approach and engaging environment.  Multiple sclerosis: Continue Baclofen.  Followed by her neurologist. Patient continues to be bedbound, requiring extensive assistance in activities of daily living. Continue ongoing supportive care. HTN: Managed with Losartan, HTCZ, Coreg.  Follow up: Palliative care will continue to follow for complex medical decision making, advance care planning, and clarification of goals. Return 6 weeks or prn. Encouraged to call provider sooner with any concerns.  CHIEF COMPLAINT: Palliative follow up  HISTORY OF PRESENT ILLNESS:  [name] a 76 y.o. female with multiple medical problems including recent COVID 19 viral infection for which she was hospitalized 12/29 -10/11/21, treated with  IV remdesivir and IV fluids.  History of multiple sclerosis, trigeminal neuralgia for which she is followed by her neurologist.  History  neuropathic pain, hypertension,  hypertension, right BKA.  Patient denies pain/discomfort, no respiratory distress.  History obtained from review of EMR, discussion with primary team, family and/or patient. Records reviewed and summarized above. All 10 point systems reviewed and are negative except as documented in history of present illness above  Review and summarization of Epic records shows history from other than patient.   Palliative Care was asked to follow this patient o help address complex decision making in the context of advance care planning and goals of care clarification.   PERTINENT MEDICATIONS:  Outpatient Encounter Medications as of 11/15/2021  Medication Sig   aspirin 81 MG EC tablet 1 tablet   atorvastatin (LIPITOR) 40 MG tablet Take 40 mg by mouth at bedtime.   baclofen (LIORESAL) 10 MG tablet  Take 1 tablet (10 mg total) by mouth 4  (four) times daily.   buPROPion (WELLBUTRIN XL) 300 MG 24 hr tablet Take 300 mg by mouth daily.   carbamazepine (TEGRETOL) 200 MG tablet TAKE 1 TABLET BY MOUTH 3 TIMES A DAY (Patient taking differently: Take 200 mg by mouth 3 (three) times daily. TAKE 1 TABLET BY MOUTH 3 TIMES A DAY)   carvedilol (COREG) 12.5 MG tablet Take 1 tablet (12.5 mg total) by mouth 2 (two) times daily with a meal.   CRANBERRY PO Take 1 capsule by mouth daily. 4200 mg   Diaper Rash Products (DESITIN CLEAR EX) Apply 1 application topically daily as needed (for skin protection).   docusate sodium (COLACE) 100 MG capsule Take 1 capsule (100 mg total) by mouth 2 (two) times daily. (Patient taking differently: Take 100 mg by mouth See admin instructions. Take 100 mg by mouth three to four times a week)   hydrochlorothiazide (HYDRODIURIL) 25 MG tablet Take 25 mg by mouth at bedtime.   Interferon Beta-1a (AVONEX PEN) 30 MCG/0.5ML AJKT INJECT 30 MCG INTRAMUSCULARLY ONCE WEEKLY   KLOR-CON M20 20 MEQ tablet Take 20 mEq by mouth 3 (three) times daily.   losartan (COZAAR) 100 MG tablet Take 100 mg by mouth daily in the afternoon.   Multiple Vitamins-Minerals (MULTIVITAMIN GUMMIES ADULT) CHEW Chew 2 tablets by mouth every morning.   NON FORMULARY Take 1 tablet by mouth See admin instructions. Super Greens tablets- Take 1 tablet by mouth once a day   Omega-3 Fatty Acids (FISH OIL) 600 MG CAPS Take 1,200 mg by mouth daily.   pregabalin (LYRICA) 100 MG capsule TAKE 1 CAPSULE BY MOUTH TWICE A DAY   sertraline (ZOLOFT) 50 MG tablet Take 75 mg by mouth daily.   traMADol (ULTRAM) 50 MG tablet 1/2 tablet prn daily. (Patient taking differently: Take 50 mg by mouth daily as needed for moderate pain. 1/2 tablet prn daily.)   No facility-administered encounter medications on file as of 11/15/2021.    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 More than 50% of the time in this consultation was spent on care coordination  Thank you for the opportunity to participate in the care of [name] Please call our office at [phone] if we can be of additional assistance.  Note: Portions of this note were generated with Lobbyist. Dictation errors may occur despite best attempts at proofreading.  Windy Canny  Mauri Pole, NP

## 2021-11-22 ENCOUNTER — Telehealth: Payer: Self-pay | Admitting: *Deleted

## 2021-11-22 NOTE — Telephone Encounter (Signed)
Handicap placard completed, signed by Butler Denmark, NP and placed in mail to the patient's home address (per family's request).

## 2021-12-13 ENCOUNTER — Other Ambulatory Visit: Payer: Self-pay | Admitting: Neurology

## 2021-12-21 ENCOUNTER — Other Ambulatory Visit: Payer: Self-pay | Admitting: Neurology

## 2021-12-21 NOTE — Telephone Encounter (Signed)
Rx refilled.

## 2021-12-22 ENCOUNTER — Telehealth: Payer: Self-pay | Admitting: Neurology

## 2021-12-22 MED ORDER — TRAMADOL HCL 50 MG PO TABS: ORAL_TABLET | ORAL | 0 refills | Status: AC

## 2021-12-22 NOTE — Telephone Encounter (Signed)
I called her son, [name]. Reporting TN pain. Is off the Lyrica. He didn't see any change with her mental state off the Lyrica. Has some Lyrica 100 mg at home, I will refill the Tramadol, he may give her a half twice daily as needed to get through the flare. If this doesn't help may restart the Lyrica. He will keep [place] posted.  ?Meds ordered this encounter  ?Medications  ? traMADol (ULTRAM) 50 MG tablet  ?  Sig: 1/2 tablet prn daily.  ?  Dispense:  30 tablet  ?  Refill:  0  ? ? ?

## 2021-12-22 NOTE — Telephone Encounter (Signed)
Note from 11/09/2021: ? ?Recommend discontinuing Lyrica for TN pain due to lethargy, potentially overmedicated? will continue carbamazepine, have suggested limiting use of tramadol for only significant pain flares, try Tylenol instead. ? ?I spoke her son, [name]. Reports right-sided trigeminal neuralgia has flared up. She has continue taking carbamazepine as prescribed. Tylenol has not provided any relief. ? ?[name] is asking if she can restart pregabalin. Also, requesting a refill of Tramadol. ? ? ? ?

## 2021-12-22 NOTE — Telephone Encounter (Signed)
Pt's son, Zurri Rudden (on [place]) Trigeminal neuralgia has come back. Having shooting pain in jaw and eyes. Ask if she want [place] to start giving her the Lyrica again. Can you refill the Tramadol? The Tylenol is not working.Would like a call back to know what to do. ?

## 2021-12-22 NOTE — Addendum Note (Signed)
Addended by: Suzzanne Cloud on: 12/22/2021 12:20 PM ? ? Modules accepted: Orders ? ?

## 2022-01-24 DIAGNOSIS — I10 Essential (primary) hypertension: Secondary | ICD-10-CM | POA: Diagnosis not present

## 2022-01-24 DIAGNOSIS — F03B0 Unspecified dementia, moderate, without behavioral disturbance, psychotic disturbance, mood disturbance, and anxiety: Secondary | ICD-10-CM | POA: Diagnosis not present

## 2022-01-24 DIAGNOSIS — K5901 Slow transit constipation: Secondary | ICD-10-CM | POA: Diagnosis not present

## 2022-01-24 DIAGNOSIS — G35 Multiple sclerosis: Secondary | ICD-10-CM | POA: Diagnosis not present

## 2022-02-12 ENCOUNTER — Emergency Department (HOSPITAL_COMMUNITY): Payer: Medicare HMO

## 2022-02-12 ENCOUNTER — Inpatient Hospital Stay (HOSPITAL_COMMUNITY)
Admission: EM | Admit: 2022-02-12 | Discharge: 2022-02-16 | DRG: 177 | Disposition: A | Discharge status: Home Health | Payer: Medicare HMO | Attending: Internal Medicine | Admitting: Internal Medicine

## 2022-02-12 ENCOUNTER — Encounter (HOSPITAL_COMMUNITY): Payer: Self-pay

## 2022-02-12 DIAGNOSIS — F32A Depression, unspecified: Secondary | ICD-10-CM | POA: Diagnosis present

## 2022-02-12 DIAGNOSIS — Z20822 Contact with and (suspected) exposure to covid-19: Secondary | ICD-10-CM | POA: Diagnosis present

## 2022-02-12 DIAGNOSIS — G9341 Metabolic encephalopathy: Secondary | ICD-10-CM | POA: Diagnosis present

## 2022-02-12 DIAGNOSIS — F0394 Unspecified dementia, unspecified severity, with anxiety: Secondary | ICD-10-CM | POA: Diagnosis present

## 2022-02-12 DIAGNOSIS — R1310 Dysphagia, unspecified: Secondary | ICD-10-CM | POA: Diagnosis present

## 2022-02-12 DIAGNOSIS — Z89511 Acquired absence of right leg below knee: Secondary | ICD-10-CM

## 2022-02-12 DIAGNOSIS — Z888 Allergy status to other drugs, medicaments and biological substances status: Secondary | ICD-10-CM

## 2022-02-12 DIAGNOSIS — Z86718 Personal history of other venous thrombosis and embolism: Secondary | ICD-10-CM | POA: Diagnosis not present

## 2022-02-12 DIAGNOSIS — Z91013 Allergy to seafood: Secondary | ICD-10-CM

## 2022-02-12 DIAGNOSIS — R4182 Altered mental status, unspecified: Principal | ICD-10-CM

## 2022-02-12 DIAGNOSIS — J181 Lobar pneumonia, unspecified organism: Secondary | ICD-10-CM | POA: Diagnosis not present

## 2022-02-12 DIAGNOSIS — E785 Hyperlipidemia, unspecified: Secondary | ICD-10-CM | POA: Diagnosis present

## 2022-02-12 DIAGNOSIS — G500 Trigeminal neuralgia: Secondary | ICD-10-CM | POA: Diagnosis present

## 2022-02-12 DIAGNOSIS — R404 Transient alteration of awareness: Secondary | ICD-10-CM | POA: Diagnosis not present

## 2022-02-12 DIAGNOSIS — Z820 Family history of epilepsy and other diseases of the nervous system: Secondary | ICD-10-CM

## 2022-02-12 DIAGNOSIS — Z79899 Other long term (current) drug therapy: Secondary | ICD-10-CM

## 2022-02-12 DIAGNOSIS — Z83438 Family history of other disorder of lipoprotein metabolism and other lipidemia: Secondary | ICD-10-CM | POA: Diagnosis not present

## 2022-02-12 DIAGNOSIS — R918 Other nonspecific abnormal finding of lung field: Secondary | ICD-10-CM | POA: Diagnosis not present

## 2022-02-12 DIAGNOSIS — I10 Essential (primary) hypertension: Secondary | ICD-10-CM | POA: Diagnosis not present

## 2022-02-12 DIAGNOSIS — R4020 Unspecified coma: Secondary | ICD-10-CM | POA: Diagnosis not present

## 2022-02-12 DIAGNOSIS — E876 Hypokalemia: Secondary | ICD-10-CM | POA: Diagnosis not present

## 2022-02-12 DIAGNOSIS — G9340 Encephalopathy, unspecified: Principal | ICD-10-CM | POA: Diagnosis present

## 2022-02-12 DIAGNOSIS — R531 Weakness: Secondary | ICD-10-CM | POA: Diagnosis not present

## 2022-02-12 DIAGNOSIS — Z7982 Long term (current) use of aspirin: Secondary | ICD-10-CM | POA: Diagnosis not present

## 2022-02-12 DIAGNOSIS — J690 Pneumonitis due to inhalation of food and vomit: Secondary | ICD-10-CM | POA: Diagnosis not present

## 2022-02-12 DIAGNOSIS — F0393 Unspecified dementia, unspecified severity, with mood disturbance: Secondary | ICD-10-CM | POA: Diagnosis present

## 2022-02-12 DIAGNOSIS — Z8049 Family history of malignant neoplasm of other genital organs: Secondary | ICD-10-CM

## 2022-02-12 DIAGNOSIS — R410 Disorientation, unspecified: Secondary | ICD-10-CM | POA: Diagnosis not present

## 2022-02-12 DIAGNOSIS — Z8249 Family history of ischemic heart disease and other diseases of the circulatory system: Secondary | ICD-10-CM

## 2022-02-12 DIAGNOSIS — U071 COVID-19: Secondary | ICD-10-CM | POA: Diagnosis not present

## 2022-02-12 DIAGNOSIS — R000 Tachycardia, unspecified: Secondary | ICD-10-CM | POA: Diagnosis not present

## 2022-02-12 DIAGNOSIS — G35 Multiple sclerosis: Secondary | ICD-10-CM | POA: Diagnosis not present

## 2022-02-12 DIAGNOSIS — L89152 Pressure ulcer of sacral region, stage 2: Secondary | ICD-10-CM | POA: Diagnosis not present

## 2022-02-12 DIAGNOSIS — D638 Anemia in other chronic diseases classified elsewhere: Secondary | ICD-10-CM | POA: Diagnosis not present

## 2022-02-12 LAB — CBC WITH DIFFERENTIAL/PLATELET
Abs Immature Granulocytes: 0.04 10*3/uL (ref 0.00–0.07)
Basophils Absolute: 0 10*3/uL (ref 0.0–0.1)
Basophils Relative: 0 %
Eosinophils Absolute: 0.2 10*3/uL (ref 0.0–0.5)
Eosinophils Relative: 1 %
HCT: 32.2 % — ABNORMAL LOW (ref 36.0–46.0)
Hemoglobin: 10.5 g/dL — ABNORMAL LOW (ref 12.0–15.0)
Immature Granulocytes: 0 %
Lymphocytes Relative: 9 %
Lymphs Abs: 1 10*3/uL (ref 0.7–4.0)
MCH: 31.5 pg (ref 26.0–34.0)
MCHC: 32.6 g/dL (ref 30.0–36.0)
MCV: 96.7 fL (ref 80.0–100.0)
Monocytes Absolute: 0.5 10*3/uL (ref 0.1–1.0)
Monocytes Relative: 5 %
Neutro Abs: 9.3 10*3/uL — ABNORMAL HIGH (ref 1.7–7.7)
Neutrophils Relative %: 85 %
Platelets: 303 10*3/uL (ref 150–400)
RBC: 3.33 MIL/uL — ABNORMAL LOW (ref 3.87–5.11)
RDW: 13.3 % (ref 11.5–15.5)
WBC: 11 10*3/uL — ABNORMAL HIGH (ref 4.0–10.5)
nRBC: 0 % (ref 0.0–0.2)

## 2022-02-12 LAB — COMPREHENSIVE METABOLIC PANEL
ALT: 17 U/L (ref 0–44)
AST: 34 U/L (ref 15–41)
Albumin: 2.6 g/dL — ABNORMAL LOW (ref 3.5–5.0)
Alkaline Phosphatase: 104 U/L (ref 38–126)
Anion gap: 9 (ref 5–15)
BUN: 11 mg/dL (ref 8–23)
CO2: 22 mmol/L (ref 22–32)
Calcium: 9.1 mg/dL (ref 8.9–10.3)
Chloride: 108 mmol/L (ref 98–111)
Creatinine, Ser: 0.57 mg/dL (ref 0.44–1.00)
GFR, Estimated: >60 mL/min (ref >60)
Glucose, Bld: 136 mg/dL — ABNORMAL HIGH (ref 70–99)
Potassium: 4.4 mmol/L (ref 3.5–5.1)
Sodium: 139 mmol/L (ref 135–145)
Total Bilirubin: 0.5 mg/dL (ref 0.3–1.2)
Total Protein: 6.5 g/dL (ref 6.5–8.1)

## 2022-02-12 LAB — RESP PANEL BY RT-PCR (FLU A&B, COVID) ARPGX2
Influenza A by PCR: NEGATIVE
Influenza B by PCR: NEGATIVE
SARS Coronavirus 2 by RT PCR: NEGATIVE

## 2022-02-12 LAB — URINALYSIS, ROUTINE W REFLEX MICROSCOPIC
Bilirubin Urine: NEGATIVE
Glucose, UA: NEGATIVE mg/dL
Hgb urine dipstick: NEGATIVE
Ketones, ur: NEGATIVE mg/dL
Nitrite: NEGATIVE
Protein, ur: NEGATIVE mg/dL
Specific Gravity, Urine: >1.03 — ABNORMAL HIGH (ref 1.005–1.030)
pH: 5.5 (ref 5.0–8.0)

## 2022-02-12 LAB — URINALYSIS, MICROSCOPIC (REFLEX): RBC / HPF: NONE SEEN RBC/hpf (ref 0–5)

## 2022-02-12 LAB — LACTIC ACID, PLASMA: Lactic Acid, Venous: 1.3 mmol/L (ref 0.5–1.9)

## 2022-02-12 MED ORDER — CARVEDILOL 12.5 MG PO TABS
12.5000 mg | ORAL_TABLET | Freq: Two times a day (BID) | ORAL | Status: DC
  Administered 2022-02-13 – 2022-02-16 (×7): 12.5 mg via ORAL
  Filled 2022-02-12 (×7): qty 1

## 2022-02-12 MED ORDER — ATORVASTATIN CALCIUM 40 MG PO TABS
40.0000 mg | ORAL_TABLET | Freq: Every day | ORAL | Status: DC
Start: 2022-02-12 — End: 2022-02-16
  Administered 2022-02-13 – 2022-02-15 (×3): 40 mg via ORAL
  Filled 2022-02-12 (×3): qty 1

## 2022-02-12 MED ORDER — HYDROCHLOROTHIAZIDE 25 MG PO TABS: 25.0000 mg | ORAL_TABLET | Freq: Every day | ORAL | Status: DC

## 2022-02-12 MED ORDER — GUAIFENESIN ER 600 MG PO TB12
1200.0000 mg | ORAL_TABLET | Freq: Two times a day (BID) | ORAL | Status: DC
  Administered 2022-02-13 – 2022-02-16 (×7): 1200 mg via ORAL
  Filled 2022-02-12 (×7): qty 2

## 2022-02-12 MED ORDER — IPRATROPIUM BROMIDE 0.02 % IN SOLN
0.5000 mg | Freq: Four times a day (QID) | RESPIRATORY_TRACT | Status: DC
  Administered 2022-02-12: 0.5 mg via RESPIRATORY_TRACT
  Filled 2022-02-12: qty 2.5

## 2022-02-12 MED ORDER — DOCUSATE SODIUM 100 MG PO CAPS
100.0000 mg | ORAL_CAPSULE | ORAL | Status: DC
  Administered 2022-02-13 – 2022-02-15 (×2): 100 mg via ORAL
  Filled 2022-02-12 (×3): qty 1

## 2022-02-12 MED ORDER — BACLOFEN 10 MG PO TABS
10.0000 mg | ORAL_TABLET | Freq: Four times a day (QID) | ORAL | Status: DC
  Administered 2022-02-13 – 2022-02-16 (×13): 10 mg via ORAL
  Filled 2022-02-12 (×14): qty 1

## 2022-02-12 MED ORDER — FISH OIL 600 MG PO CAPS: 1200.0000 mg | ORAL_CAPSULE | Freq: Every day | ORAL | Status: DC

## 2022-02-12 MED ORDER — IPRATROPIUM-ALBUTEROL 0.5-2.5 (3) MG/3ML IN SOLN
3.0000 mL | Freq: Four times a day (QID) | RESPIRATORY_TRACT | Status: DC
  Administered 2022-02-12 – 2022-02-14 (×7): 3 mL via RESPIRATORY_TRACT
  Filled 2022-02-12 (×8): qty 3

## 2022-02-12 MED ORDER — OMEGA-3-ACID ETHYL ESTERS 1 G PO CAPS
1.0000 g | ORAL_CAPSULE | Freq: Every day | ORAL | Status: DC
  Administered 2022-02-13 – 2022-02-16 (×3): 1 g via ORAL
  Filled 2022-02-12 (×6): qty 1

## 2022-02-12 MED ORDER — SODIUM CHLORIDE 0.9 % IV SOLN: INTRAVENOUS | Status: AC

## 2022-02-12 MED ORDER — ADULT MULTIVITAMIN W/MINERALS CH
1.0000 | ORAL_TABLET | Freq: Every day | ORAL | Status: DC
  Administered 2022-02-13 – 2022-02-16 (×4): 1 via ORAL
  Filled 2022-02-12 (×3): qty 1

## 2022-02-12 MED ORDER — LOSARTAN POTASSIUM 50 MG PO TABS
100.0000 mg | ORAL_TABLET | Freq: Every day | ORAL | Status: DC
  Administered 2022-02-13 – 2022-02-16 (×4): 100 mg via ORAL
  Filled 2022-02-12 (×4): qty 2

## 2022-02-12 MED ORDER — SERTRALINE HCL 50 MG PO TABS
75.0000 mg | ORAL_TABLET | Freq: Every day | ORAL | Status: DC
  Administered 2022-02-13 – 2022-02-16 (×4): 75 mg via ORAL
  Filled 2022-02-12 (×4): qty 1

## 2022-02-12 MED ORDER — BUPROPION HCL ER (XL) 150 MG PO TB24
300.0000 mg | ORAL_TABLET | Freq: Every day | ORAL | Status: DC
  Administered 2022-02-13 – 2022-02-16 (×4): 300 mg via ORAL
  Filled 2022-02-12 (×4): qty 2

## 2022-02-12 MED ORDER — TRAMADOL HCL 50 MG PO TABS
50.0000 mg | ORAL_TABLET | Freq: Two times a day (BID) | ORAL | Status: DC | PRN
  Administered 2022-02-13 – 2022-02-15 (×4): 50 mg via ORAL
  Filled 2022-02-12 (×4): qty 1

## 2022-02-12 MED ORDER — LACTATED RINGERS IV BOLUS
1000.0000 mL | Freq: Once | INTRAVENOUS | Status: AC
  Administered 2022-02-12: 1000 mL via INTRAVENOUS

## 2022-02-12 MED ORDER — ASPIRIN EC 81 MG PO TBEC
81.0000 mg | DELAYED_RELEASE_TABLET | Freq: Every day | ORAL | Status: DC
  Administered 2022-02-13 – 2022-02-16 (×4): 81 mg via ORAL
  Filled 2022-02-12 (×4): qty 1

## 2022-02-12 MED ORDER — POTASSIUM CHLORIDE CRYS ER 20 MEQ PO TBCR
20.0000 meq | EXTENDED_RELEASE_TABLET | Freq: Three times a day (TID) | ORAL | Status: DC
  Administered 2022-02-13 – 2022-02-14 (×4): 20 meq via ORAL
  Filled 2022-02-12 (×4): qty 1

## 2022-02-12 MED ORDER — CARBAMAZEPINE 200 MG PO TABS
200.0000 mg | ORAL_TABLET | Freq: Three times a day (TID) | ORAL | Status: DC
  Administered 2022-02-13 – 2022-02-16 (×10): 200 mg via ORAL
  Filled 2022-02-12 (×14): qty 1

## 2022-02-12 MED ORDER — ENOXAPARIN SODIUM 40 MG/0.4ML IJ SOSY
40.0000 mg | PREFILLED_SYRINGE | INTRAMUSCULAR | Status: DC
  Administered 2022-02-12 – 2022-02-15 (×4): 40 mg via SUBCUTANEOUS
  Filled 2022-02-12 (×4): qty 0.4

## 2022-02-12 MED ORDER — IOHEXOL 300 MG/ML  SOLN
75.0000 mL | Freq: Once | INTRAMUSCULAR | Status: AC | PRN
  Administered 2022-02-12: 75 mL via INTRAVENOUS

## 2022-02-12 MED ORDER — MULTIVITAMIN GUMMIES ADULT PO CHEW: 2.0000 | CHEWABLE_TABLET | Freq: Every morning | ORAL | Status: DC

## 2022-02-12 MED ORDER — SODIUM CHLORIDE 0.9 % IV SOLN
3.0000 g | Freq: Four times a day (QID) | INTRAVENOUS | Status: AC
  Administered 2022-02-12 – 2022-02-16 (×14): 3 g via INTRAVENOUS
  Filled 2022-02-12 (×15): qty 8

## 2022-02-12 MED ORDER — SODIUM CHLORIDE 0.9 % IV SOLN
1.0000 g | Freq: Once | INTRAVENOUS | Status: AC
  Administered 2022-02-12: 1 g via INTRAVENOUS
  Filled 2022-02-12: qty 10

## 2022-02-12 NOTE — ED Notes (Signed)
Patient transported to CT 

## 2022-02-12 NOTE — ED Notes (Signed)
Per son, pt will answer yes/no question but does not talk more than that.  ?

## 2022-02-12 NOTE — ED Provider Notes (Signed)
?[facility] ?Provider Note ? ?CSN: [id number] ?Arrival date & time: 02/12/22 1258 ? ?Chief Complaint(s) ?Altered Mental Status ? ?HPI ?[name] is a 76 y.o. female with PMH multiple sclerosis with chronic right-sided upper extremity and lower extremity weakness, dementia, right BKA who presents emergency department for evaluation of altered mental status.  History obtained from patient's son who states that she was at normal baseline this morning, received her morning medications and he took a nap on the couch.  Upon awakening, the patient had a decreased level of responsiveness and was not responding to commands.  Denies fever, complaints of chest pain, vomiting or other systemic symptoms.  Additional history unable to be obtained as the patient is currently altered. ? ? ?Past Medical History ?Past Medical History:  ?Diagnosis Date  ? Abnormality of gait   ? Dementia ([place])   ? Depression   ? DVT (deep venous thrombosis) ([place])   ? Left leg  ? Dyslipidemia   ? Fibroids   ? Hypertension   ? Memory difficulties 12/10/2014  ? Multiple sclerosis ([place])   ? Right sided weakness and gait disorder, bladder and bowel problems   ? Neurogenic bladder   ? 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 Overdose of muscle relaxant   ? Unintentional baclofen overdose   ? Trigeminal neuralgia   ? Right  ? Wound infection 10/12/2018  ? ?Patient Active Problem List  ? Diagnosis Date Noted  ? Aspiration pneumonia ([place]) 02/12/2022  ? Encephalopathy 02/12/2022  ? Hx of BKA, right ([place]) 10/07/2021  ? Hypokalemia 10/07/2021  ? AMS (altered mental status) 10/07/2021  ? Severe sepsis ([place]) 06/29/2021  ? Community acquired pneumonia 06/29/2021  ? Pressure injury of skin 06/26/2021  ? Acute respiratory failure with hypoxia ([place]) 06/25/2021  ? Acute metabolic encephalopathy 67/20/9470  ? COVID-19 virus infection 06/25/2021  ? Prolonged QT interval 06/25/2021  ? SIRS (systemic inflammatory response  syndrome) ([place]) 06/25/2021  ? Elevated troponin 06/25/2021  ? Cardiomegaly 06/25/2021  ? Bedbound 10/13/2018  ? Cellulitis of right lower extremity   ? Wound infection 10/12/2018  ? Subacute osteomyelitis, right ankle and foot ([place])   ? Neurogenic bladder 06/16/2016  ? Memory difficulties 12/10/2014  ? Gait disorder 07/01/2013  ? Tooth pain 11/02/2011  ? Protein calorie malnutrition ([place]) 10/07/2011  ? Constipation 09/23/2011  ? Failure to thrive in childhood 09/22/2011  ? Decubitus ulcer, buttock 09/22/2011  ? Fibroid uterus 09/22/2011  ? Abdominal pain 09/22/2011  ? Multiple sclerosis ([place])   ? Hypertension   ? Depression   ? Acute encephalopathy   ? Trigeminal neuralgia   ? ?Home Medication(s) ?Prior to Admission medications   ?Medication Sig Start Date End Date Taking? Authorizing Provider  ?aspirin 81 MG EC tablet Take 81 mg by mouth daily. 06/29/21  Yes [provider]  ?atorvastatin (LIPITOR) 40 MG tablet Take 40 mg by mouth at bedtime.   Yes [provider]  ?baclofen (LIORESAL) 10 MG tablet TAKE 1 TABLET BY MOUTH 4 TIMES DAILY. ?Patient taking differently: Take 10 mg by mouth 3 (three) times daily. 12/21/21  Yes Suzzanne Cloud, NP  ?buPROPion (WELLBUTRIN XL) 300 MG 24 hr tablet Take 300 mg by mouth daily.   Yes Deneise Lever, MD  ?carbamazepine (TEGRETOL) 200 MG tablet TAKE 1 TABLET BY MOUTH THREE TIMES A DAY 12/13/21  Yes Suzzanne Cloud, NP  ?carvedilol (COREG) 12.5 MG tablet Take 1 tablet (  12.5 mg total) by mouth 2 (two) times daily with a meal. 10/11/21  Yes Charlynne Cousins, MD  ?Cholecalciferol (VITAMIN D3) 125 MCG (5000 UT) CAPS Take 1 capsule by mouth daily.   Yes [provider]  ?CRANBERRY PO Take 1 capsule by mouth daily. 4200 mg   Yes [provider]  ?Diaper Rash Products (DESITIN CLEAR EX) Apply 1 application topically daily as needed (for skin protection).   Yes [provider]  ?docusate sodium (COLACE) 100 MG capsule Take 1 capsule (100 mg total)  by mouth 2 (two) times daily. ?Patient taking differently: Take 100 mg by mouth daily. Take 100 mg by mouth three to four times a week 10/24/18  Yes Mikhail, Martin, DO  ?hydrochlorothiazide (HYDRODIURIL) 25 MG tablet Take 25 mg by mouth every morning. 12/29/18  Yes [provider]  ?Interferon Beta-1a (AVONEX PEN) 30 MCG/0.5ML AJKT INJECT 30 MCG INTRAMUSCULARLY ONCE WEEKLY 11/03/21  Yes Suzzanne Cloud, NP  ?KLOR-CON M20 20 MEQ tablet Take 20 mEq by mouth 3 (three) times daily.   Yes [provider]  ?losartan (COZAAR) 100 MG tablet Take 100 mg by mouth daily in the afternoon.   Yes [provider]  ?Multiple Vitamins-Minerals (MULTIVITAMIN GUMMIES ADULT) CHEW Chew 2 tablets by mouth every morning.   Yes [provider]  ?Omega-3 Fatty Acids (FISH OIL) 600 MG CAPS Take 1,200 mg by mouth daily.   Yes [provider]  ?pregabalin (LYRICA) 100 MG capsule TAKE 1 CAPSULE BY MOUTH TWICE A DAY 07/21/21  Yes Kathrynn Ducking, MD  ?sertraline (ZOLOFT) 50 MG tablet Take 75 mg by mouth daily.   Yes [provider]  ?traMADol (ULTRAM) 50 MG tablet 1/2 tablet prn daily. 12/22/21  Yes Suzzanne Cloud, NP  ?                                                                                                                                  ?Past Surgical History ?Past Surgical History:  ?Procedure Laterality Date  ? AMPUTATION Right 10/17/2018  ? Procedure: RIGHT BELOW KNEE AMPUTATION;  Surgeon: [name], MD;  Location: [place] [facility];  Service: Orthopedics;  Laterality: Right;  ? gamma knife procedure for rt. trigeminal neuralgia Right   ? left leg DVT Left   ? STRABISMUS SURGERY    ? Right eye   ? ?Family History ?Family History  ?Problem Relation Age of Onset  ? Multiple sclerosis Sister   ? Cervical cancer Mother   ? Liver disease Father   ? Hyperlipidemia Brother   ? Pancreatitis Brother   ? Hypertension Other   ? Heart disease Other   ? Uterine cancer Other   ? ? ?Social  History ?Social History  ? ?Tobacco Use  ? Smoking status: Never  ? Smokeless tobacco: Never  ?Vaping Use  ? Vaping Use: Never used  ?Substance Use Topics  ? Alcohol use: No  ?  Drug use: No  ? ?Allergies ?Prednisone, Shellfish allergy, and Wound dressing adhesive ? ?Review of Systems ?Review of Systems  ?Unable to perform ROS: Mental status change  ?Respiratory:  Positive for cough.   ?Psychiatric/Behavioral:  Positive for confusion.   ? ?Physical Exam ?Vital Signs  ?I have reviewed the triage vital signs ?BP (!) 147/51 (BP Location: Right Arm)   Pulse 90   Temp 99.1 ?F (37.3 ?C) (Oral)   Resp (!) 35   SpO2 97%  ? ?Physical Exam ?Vitals and nursing note reviewed.  ?Constitutional:   ?   General: She is not in acute distress. ?   Appearance: She is well-developed.  ?HENT:  ?   Head: Normocephalic and atraumatic.  ?Eyes:  ?   Conjunctiva/sclera: Conjunctivae normal.  ?Cardiovascular:  ?   Rate and Rhythm: Normal rate and regular rhythm.  ?   Heart sounds: No murmur heard. ?Pulmonary:  ?   Effort: Pulmonary effort is normal. No respiratory distress.  ?   Breath sounds: Rales present.  ?Abdominal:  ?   Palpations: Abdomen is soft.  ?   Tenderness: There is no abdominal tenderness.  ?Musculoskeletal:     ?   General: No swelling.  ?   Cervical back: Neck supple.  ?Skin: ?   General: Skin is warm and dry.  ?   Capillary Refill: Capillary refill takes less than 2 seconds.  ?Neurological:  ?   Mental Status: She is alert. Mental status is at baseline.  ?   Motor: Weakness present.  ?Psychiatric:     ?   Mood and Affect: Mood normal.  ? ? ?ED Results and Treatments ?Labs ?(all labs ordered are listed, but only abnormal results are displayed) ?Labs Reviewed  ?COMPREHENSIVE METABOLIC PANEL - Abnormal; Notable for the following components:  ?    Result Value  ? Glucose, Bld 136 (*)   ? Albumin 2.6 (*)   ? All other components within normal limits  ?CBC WITH DIFFERENTIAL/PLATELET - Abnormal; Notable for the following  components:  ? WBC 11.0 (*)   ? RBC 3.33 (*)   ? Hemoglobin 10.5 (*)   ? HCT 32.2 (*)   ? Neutro Abs 9.3 (*)   ? All other components within normal limits  ?URINALYSIS, ROUTINE W REFLEX MICROSCOPIC - Abnormal; Notable for the follo

## 2022-02-12 NOTE — ED Notes (Signed)
Help straighten patient up in bed patient is resting with family at bedside   ?

## 2022-02-12 NOTE — Progress Notes (Addendum)
Pharmacy Antibiotic Note ? ?[name] is a 76 y.o. female for which pharmacy has been consulted for unasyn dosing for  aspiration pna . ? ?Patient with a history of MS on interferon therapy, chronic right sided weakness, s/p right BKA, chronic dysphagia, HTN, HLD, anxiety/depression, trigeminal neuralgia, memory impairment. Patient presenting with confusion and unresponsiveness. ? ?SCr 0.57 - at baseline ?WBC 11; LA 1.3; T 98.4 F: HR 108; RR 26 ? ?Plan: ?Unasyn 3g q6h ?Trend WBC, Fever, Renal function, & Clinical course ?F/u cultures, clinical course, WBC, fever ?De-escalate when able ? ?  ? ?Temp (24hrs), Avg:98.4 ?F (36.9 ?C), Min:98.4 ?F (36.9 ?C), Max:98.4 ?F (36.9 ?C) ? ?Recent Labs  ?Lab 02/12/22 ?1309 02/12/22 ?1406  ?WBC 11.0*  --   ?CREATININE 0.57  --   ?LATICACIDVEN  --  1.3  ?  ?CrCl cannot be calculated (Unknown ideal weight.).   ? ?Allergies  ?Allergen Reactions  ? Prednisone Other (See Comments)  ?  Caused thrush- will need something to reverse this  ? Shellfish Allergy Hives  ?   ?  ? Wound Dressing Adhesive   ?  Splits the skin  ? ? ?Antimicrobials this admission: ?unasyn 5/6 >>  ?Rocephin x 1 in ED 5/6 ? ?Microbiology results: ?Pending ? ?Thank you for allowing pharmacy to be a part of this patient?s care. ? ?Lorelei Pont, PharmD, BCPS ?02/12/2022 5:14 PM ?ED Clinical Pharmacist -  [phone] ? ? ?

## 2022-02-12 NOTE — H&P (Addendum)
?History and Physical  ? ? ?GURBANI FIGGE WUX:[id number] DOB: 1946/05/08 DOA: 02/12/2022 ? ?PCP: Glenis Smoker, MD (Confirm with patient/family/NH records and if not entered, this has to be entered at [facility] point of entry) ?Patient coming from: Home ? ?I have personally briefly reviewed patient's old medical records in [place] ? ?Chief Complaint: Patient sleepy, unable to talk ? ?HPI: [name] is a 76 y.o. 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 ? ?At baseline, patient was evaluated by home speech therapy and patient is on modified diet including soft diet and thin liquid, but despite, son observed that patient occasionally has coughing and choking episode.  Recently patient has had decreased oral intake, and estimated lost about 20 pounds since January.  This morning, patient woke up with several episodes of coughing and choking, and son went to see her and found her more confused and unresponsive with eyes white opening. ? ?For MS, patient has been stable without any flareup " for years". Looks like her last MRI was more than 10 years ago showed chronic MS related changes.  Son reported that as he remembered last time patient had flareup patient was complaining about whole body weakness and some level  of mentation changes.  ? ?ED Course: Tachycardia, no hypotension no hypoxia.  X-ray showed right lower field infiltrates suspicious for pneumonia.  CT chest showed right lower lobe infiltrates and consolidation compatible with aspiration pneumonia. ? ?WBC 11.0.  Patient was given 1 dose of ceftriaxone in the ED.   ? ?Review of Systems: Unable to perform, patient not  responding. ? ?Past Medical History:  ?Diagnosis Date  ? Abnormality of gait   ? Dementia ([place])   ? Depression   ? DVT (deep venous thrombosis) ([place])   ? Left leg  ? Dyslipidemia   ? Fibroids   ? Hypertension   ? Memory difficulties 12/10/2014  ? Multiple sclerosis ([place])   ? Right sided weakness and gait disorder, bladder and bowel problems   ? Neurogenic bladder   ? 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 Overdose of muscle relaxant   ? Unintentional baclofen overdose   ? Trigeminal neuralgia   ? Right  ? Wound infection 10/12/2018  ? ? ?Past Surgical History:  ?Procedure Laterality Date  ? AMPUTATION Right 10/17/2018  ? Procedure: RIGHT BELOW KNEE AMPUTATION;  Surgeon: [name], MD;  Location: [facility];  Service: Orthopedics;  Laterality: Right;  ? gamma knife procedure for rt. trigeminal neuralgia Right   ? left leg DVT Left   ? STRABISMUS SURGERY    ? Right eye   ? ? ? reports that she has never smoked. She has never used smokeless tobacco. She reports that she does not drink alcohol and does not use drugs. ? ?Allergies  ?Allergen Reactions  ? Prednisone Other (See Comments)  ?  Caused thrush- will need something to reverse this  ? Shellfish Allergy Hives  ?   ?  ? Wound Dressing Adhesive   ?  Splits the skin  ? ? ?Family History  ?Problem Relation Age of Onset  ? Multiple sclerosis Sister   ? Cervical cancer Mother   ? Liver disease Father   ?  Hyperlipidemia Brother   ? Pancreatitis Brother   ? Hypertension Other   ? Heart disease Other   ? Uterine cancer Other   ? ? ? ?Prior to Admission medications   ?Medication Sig Start Date End Date Taking? Authorizing Provider  ?aspirin 81 MG EC tablet 1 tablet 06/29/21   [provider]  ?atorvastatin (LIPITOR) 40 MG tablet Take 40 mg by mouth at bedtime.    [provider]  ?baclofen (LIORESAL) 10 MG tablet TAKE 1 TABLET BY MOUTH 4 TIMES DAILY. 12/21/21   Suzzanne Cloud, NP  ?buPROPion (WELLBUTRIN XL) 300 MG 24 hr tablet Take 300 mg by mouth daily.     Deneise Lever, MD  ?carbamazepine (TEGRETOL) 200 MG tablet TAKE 1 TABLET BY MOUTH THREE TIMES A DAY 12/13/21   Suzzanne Cloud, NP  ?carvedilol (COREG) 12.5 MG tablet Take 1 tablet (12.5 mg total) by mouth 2 (two) times daily with a meal. 10/11/21   Charlynne Cousins, MD  ?CRANBERRY PO Take 1 capsule by mouth daily. 4200 mg    [provider]  ?Diaper Rash Products (DESITIN CLEAR EX) Apply 1 application topically daily as needed (for skin protection).    [provider]  ?docusate sodium (COLACE) 100 MG capsule Take 1 capsule (100 mg total) by mouth 2 (two) times daily. ?Patient taking differently: Take 100 mg by mouth See admin instructions. Take 100 mg by mouth three to four times a week 10/24/18   Cristal Ford, DO  ?hydrochlorothiazide (HYDRODIURIL) 25 MG tablet Take 25 mg by mouth at bedtime. 12/29/18   [provider]  ?Interferon Beta-1a (AVONEX PEN) 30 MCG/0.5ML AJKT INJECT 30 MCG INTRAMUSCULARLY ONCE WEEKLY 11/03/21   Suzzanne Cloud, NP  ?KLOR-CON M20 20 MEQ tablet Take 20 mEq by mouth 3 (three) times daily.    [provider]  ?losartan (COZAAR) 100 MG tablet Take 100 mg by mouth daily in the afternoon.    [provider]  ?Multiple Vitamins-Minerals (MULTIVITAMIN GUMMIES ADULT) CHEW Chew 2 tablets by mouth every morning.    [provider]  ?NON FORMULARY Take 1 tablet by mouth See admin instructions. Super Greens tablets- Take 1 tablet by mouth once a day    [provider]  ?Omega-3 Fatty Acids (FISH OIL) 600 MG CAPS Take 1,200 mg by mouth daily.    [provider]  ?pregabalin (LYRICA) 100 MG capsule TAKE 1 CAPSULE BY MOUTH TWICE A DAY 07/21/21   Kathrynn Ducking, MD  ?sertraline (ZOLOFT) 50 MG tablet Take 75 mg by mouth daily.    [provider]  ?traMADol (ULTRAM) 50 MG tablet 1/2 tablet prn daily. 12/22/21   Suzzanne Cloud, NP  ? ? ?Physical Exam: ?Vitals:  ? 02/12/22 1310 02/12/22 1315 02/12/22 1445 02/12/22 1545   ?BP: (!) 153/66 (!) 147/70 140/65 (!) 146/66  ?Pulse: (!) 104 (!) 103 (!) 105 (!) 108  ?Resp: (!) 29 (!) 29 (!) 27 (!) 26  ?Temp: 98.4 ?F (36.9 ?C)     ?TempSrc: Oral     ?SpO2: 97% 96% 97% 97%  ? ? ?Constitutional: NAD, calm, comfortable ?Vitals:  ? 02/12/22 1310 02/12/22 1315 02/12/22 1445 02/12/22 1545  ?BP: (!) 153/66 (!) 147/70 140/65 (!) 146/66  ?Pulse: (!) 104 (!) 103 (!) 105 (!) 108  ?Resp: (!) 29 (!) 29 (!) 27 (!) 26  ?Temp: 98.4 ?F (36.9 ?C)     ?TempSrc: Oral     ?SpO2: 97% 96% 97% 97%  ? ?  Eyes: PERRL, lids and conjunctivae normal ?ENMT: Mucous membranes are dry. Posterior pharynx clear of any exudate or lesions.Normal dentition.  ?Neck: normal, supple, no masses, no thyromegaly ?Respiratory: clear to auscultation bilaterally, no wheezing, no crackles. Normal respiratory effort. No accessory muscle use.  ?Cardiovascular: Regular rate and rhythm, no murmurs / rubs / gallops. No extremity edema. 2+ pedal pulses. No carotid bruits.  ?Abdomen: no tenderness, no masses palpated. No hepatosplenomegaly. Bowel sounds positive.  ?Musculoskeletal: no clubbing / cyanosis. No joint deformity upper and lower extremities. Good ROM, no contractures. Normal muscle tone.  ?Skin: no rashes, lesions, ulcers. No induration ?Neurologic: CN 2-12 grossly intact. Sensation intact, DTR normal.  Chronic right-sided weakness ?Psychiatric: Opens eyes, following simple commands, not talking. ? ? ? ?Labs on Admission: I have personally reviewed following labs and imaging studies ? ?CBC: ?Recent Labs  ?Lab 02/12/22 ?1309  ?WBC 11.0*  ?NEUTROABS 9.3*  ?HGB 10.5*  ?HCT 32.2*  ?MCV 96.7  ?PLT 303  ? ?Basic Metabolic Panel: ?Recent Labs  ?Lab 02/12/22 ?1309  ?NA 139  ?K 4.4  ?CL 108  ?CO2 22  ?GLUCOSE 136*  ?BUN 11  ?CREATININE 0.57  ?CALCIUM 9.1  ? ?GFR: ?CrCl cannot be calculated (Unknown ideal weight.). ?Liver Function Tests: ?Recent Labs  ?Lab 02/12/22 ?1309  ?AST 34  ?ALT 17  ?ALKPHOS 104  ?BILITOT 0.5  ?PROT 6.5  ?ALBUMIN 2.6*   ? ?No results for input(s): LIPASE, AMYLASE in the last 168 hours. ?No results for input(s): AMMONIA in the last 168 hours. ?Coagulation Profile: ?No results for input(s): INR, PROTIME in the last 168 hours. ?[facility]

## 2022-02-12 NOTE — ED Triage Notes (Signed)
Pt BIB [facility] from home after family called for AMS. Family reports to EMS that patient behavior is normal regarding right sided weakness, however only responding to painful stimuli. VS stable, NAD.  ?

## 2022-02-12 NOTE — ED Notes (Signed)
Got patient undressed on the monitor did ekg shown to Dr Matilde Sprang patient is resting with family at bedside ?

## 2022-02-13 ENCOUNTER — Inpatient Hospital Stay (HOSPITAL_COMMUNITY): Payer: Medicare HMO

## 2022-02-13 DIAGNOSIS — G35 Multiple sclerosis: Secondary | ICD-10-CM

## 2022-02-13 DIAGNOSIS — G9340 Encephalopathy, unspecified: Secondary | ICD-10-CM | POA: Diagnosis not present

## 2022-02-13 DIAGNOSIS — J690 Pneumonitis due to inhalation of food and vomit: Secondary | ICD-10-CM | POA: Diagnosis not present

## 2022-02-13 LAB — BASIC METABOLIC PANEL
Anion gap: 11 (ref 5–15)
BUN: 7 mg/dL — ABNORMAL LOW (ref 8–23)
CO2: 24 mmol/L (ref 22–32)
Calcium: 8.5 mg/dL — ABNORMAL LOW (ref 8.9–10.3)
Chloride: 105 mmol/L (ref 98–111)
Creatinine, Ser: 0.45 mg/dL (ref 0.44–1.00)
GFR, Estimated: >60 mL/min (ref >60)
Glucose, Bld: 96 mg/dL (ref 70–99)
Potassium: 2.9 mmol/L — ABNORMAL LOW (ref 3.5–5.1)
Sodium: 140 mmol/L (ref 135–145)

## 2022-02-13 LAB — CBC
HCT: 27.2 % — ABNORMAL LOW (ref 36.0–46.0)
Hemoglobin: 8.8 g/dL — ABNORMAL LOW (ref 12.0–15.0)
MCH: 31.1 pg (ref 26.0–34.0)
MCHC: 32.4 g/dL (ref 30.0–36.0)
MCV: 96.1 fL (ref 80.0–100.0)
Platelets: 239 10*3/uL (ref 150–400)
RBC: 2.83 MIL/uL — ABNORMAL LOW (ref 3.87–5.11)
RDW: 13.1 % (ref 11.5–15.5)
WBC: 10.8 10*3/uL — ABNORMAL HIGH (ref 4.0–10.5)
nRBC: 0 % (ref 0.0–0.2)

## 2022-02-13 LAB — POTASSIUM: Potassium: 3.6 mmol/L (ref 3.5–5.1)

## 2022-02-13 LAB — MAGNESIUM: Magnesium: 1.4 mg/dL — ABNORMAL LOW (ref 1.7–2.4)

## 2022-02-13 MED ORDER — POTASSIUM CHLORIDE 10 MEQ/100ML IV SOLN
10.0000 meq | INTRAVENOUS | Status: AC
  Administered 2022-02-13 (×3): 10 meq via INTRAVENOUS
  Filled 2022-02-13 (×3): qty 100

## 2022-02-13 MED ORDER — MAGNESIUM SULFATE 2 GM/50ML IV SOLN
2.0000 g | Freq: Once | INTRAVENOUS | Status: AC
  Administered 2022-02-13: 2 g via INTRAVENOUS
  Filled 2022-02-13: qty 50

## 2022-02-13 NOTE — Progress Notes (Signed)
Pt unable to urinate, bladder scan showed 438m of urine in pt bladder.  ?Order for in and out cath received for acute urinary retention. ?Procedure performed at the bedside 5N10, by ILorenso Courier RN assisted by AWeldon Picking RN.  ?Procedure explained to pt and peri care completed. Sterile technique maintained during procedure and 6016mof clear yellow urine collected. No complications, pt tolerated well.  ?

## 2022-02-13 NOTE — Evaluation (Signed)
Clinical/Bedside Swallow Evaluation ?Patient Details  ?Name: [name] ?MRN: [id number] ?Date of Birth: 22-Aug-1946 ? ?Today's Date: 02/13/2022 ?Time: SLP Start Time (ACUTE ONLY): 1210 SLP Stop Time (ACUTE ONLY): 1228 ?SLP Time Calculation (min) (ACUTE ONLY): 18 min ? ?Past Medical History:  ?Past Medical History:  ?Diagnosis Date  ? Abnormality of gait   ? Dementia ([place])   ? Depression   ? DVT (deep venous thrombosis) ([place])   ? Left leg  ? Dyslipidemia   ? Fibroids   ? Hypertension   ? Memory difficulties 12/10/2014  ? Multiple sclerosis ([place])   ? Right sided weakness and gait disorder, bladder and bowel problems   ? Neurogenic bladder   ? 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 Overdose of muscle relaxant   ? Unintentional baclofen overdose   ? Trigeminal neuralgia   ? Right  ? Wound infection 10/12/2018  ? ?Past Surgical History:  ?Past Surgical History:  ?Procedure Laterality Date  ? AMPUTATION Right 10/17/2018  ? Procedure: RIGHT BELOW KNEE AMPUTATION;  Surgeon: [name], MD;  Location: [facility];  Service: Orthopedics;  Laterality: Right;  ? gamma knife procedure for rt. trigeminal neuralgia Right   ? left leg DVT Left   ? STRABISMUS SURGERY    ? Right eye   ? ?HPI:  ?[name] is a 76 y.o. female  who was brought in by family member for acute onset of confusion and unresponsiveness.CT chest 5/6 showed right lower lobe consolidation with more mild nodular opacities  seen in the posterior right middle lobe, likely due to infection or  aspiration. Home diet after eval by SLP is soft/thin liquids.  Per notes, son reports occasional choking and coughing; recent decreased PO intake and estimated weight loss of 20 lbs. 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 Pt  has been seen by SLP on 10/08/21 and 06/26/21 for swallowing - during both admissions, changes in swallowing were related to mentation rather than biomechanical deficit; she was D/Cd on thin liquids with no concerns for aspiration on a regular basis.  ?  ?Assessment / Plan / Recommendation  ?Clinical Impression ? [name] participated in clinical swallowing assessment, demonstrating no s/s of an oropharyngeal dysphagia.  She presented with good oral recognition and mastication of solids with no oral residue post-swallow.  Swallow response was present/palpable and not followed by any coughing/wet-phonation/throat-clearing. There was notable belching after session, which may or may not suggest some degree of esophageal involvement, but there was no indication of aspiration.  During prior admssions, changes in swallowing have been related to overall cognitive status rather than a biomechanical deficit.  Recommend continuing dysphagia 3 diet, thin liquids. RN states she did well taking meds with water.  No other issues identified for which SLP can offer our services - we will sign off. ? ?3:26: returned to room to speak with [name]'s son, [name], who was at bedside. He reports occasional issues with coughing related to impulsivity during self-feeding.  We talked about the risk of aspiration in a person who is bed-bound and more likely to develop adverse consequences from aspiration.  However, overall, pt swallows quite well most of the time. She has lost weight - there are days she shows little interest in food. ? ?She might benefit  from a consult with dietitian while admitted.   ? ? ? ?SLP Visit Diagnosis: Dysphagia, unspecified (R13.10) ?   ?Aspiration Risk ?    ?  ?Diet Recommendation    ? ?Medication Administration: Whole meds with liquid  ?  ?Other  Recommendations Recommended Consults: Consider esophageal assessment ?Oral Care Recommendations: Oral care BID   ? ?Recommendations for follow up therapy are one  component of a multi-disciplinary discharge planning process, led by the attending physician.  Recommendations may be updated based on patient status, additional functional criteria and insurance authorization. ? ?Follow up Recommendations No SLP follow up  ? ? ?  ?Assistance Recommended at Discharge Frequent or constant Supervision/Assistance  ? ? ?Swallow Study   ?General HPI: [name] is a 76 y.o. female  who was brought in by family member for acute onset of confusion and unresponsiveness.CT chest 5/6 showed right lower lobe consolidation with more mild nodular opacities  seen in the posterior right middle lobe, likely due to infection or  aspiration. Home diet after eval by SLP is soft/thin liquids.  Per notes, son reports occasional choking and coughing; recent decreased PO intake and estimated weight loss of 20 lbs. 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 Pt has been seen by SLP on 10/08/21 and 06/26/21 for swallowing - during both admissions, changes in swallowing were related to mentation rather than biomechanical deficit; she was D/Cd on thin liquids with no concerns for aspiration on a regular basis. ?Type of Study: Bedside Swallow Evaluation ?Previous Swallow Assessment: see HPI ?Diet Prior to this Study: Dysphagia 3 (soft);Thin liquids ?Temperature Spikes Noted: No ?Respiratory Status: Room air ?History of Recent Intubation: No ?Behavior/Cognition: Alert;Cooperative ?Oral Cavity Assessment: Within Functional Limits ?Oral Care Completed by SLP: Recent completion by staff ?Vision: Functional for self-feeding ?Self-Feeding Abilities: Needs assist ?Patient Positioning: Upright in bed ?Baseline Vocal Quality: Normal ?Volitional Cough: Strong ?Volitional  Swallow: Able to elicit  ?  ?Oral/Motor/Sensory Function Overall Oral Motor/Sensory Function: Within functional limits   ?Ice Chips Ice chips: Within functional limits   ?Thin Liquid Thin Liquid: Within functional limits  ?  ?Nectar Thick Nectar Thick Liquid: Not tested   ?Honey Thick Honey Thick Liquid: Not tested   ?Puree Puree: Within functional limits   ?Solid ? ? ?  Solid: Within functional limits  ? ?  ?Salik Grewell L. Conner Muegge, MA CCC/SLP ?Acute Rehabilitation Services ?Office number [phone] ?Pager [phone] ? ?[name] ?02/13/2022,12:43 PM ? ? ? ?

## 2022-02-13 NOTE — Progress Notes (Signed)
? ? ? [facility] ?                                                                            ? ? ?[name], is a 76 y.o. female, DOB - 16-Jan-1946, XFG:[id number] ?Admit date - 02/12/2022    ?Outpatient Primary MD for the patient is Glenis Smoker, MD ? ?LOS - 1  days ? ? ? ?Brief summary  ? ? ?[name] is a 76 y.o. 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 ? CT chest showed right lower lobe infiltrates and consolidation compatible with aspiration pneumonia. ?CT head showed no acute abnormality, only demyelinating disease.  ?She was started on broad spectrum IV antibiotics.  ? ?Assessment & Plan  ? ? ?Assessment and Plan: ? ? ? ?Acute metabolic encephalopathy probably secondary to aspiration pneumonia. ?Patient is more alert today and following simple commands and answering simple questions. ?MRI of the brain with and without contrast are ordered for evaluation of MS flareup. ? ? ? ?Aspiration pneumonia ?SLP evaluation ordered , currently on soft diet. ?Continue with IV antibiotics. ?Tmax is 99.9 and leukocytosis improving ? ?Multiple sclerosis with associated dysphagia ?Recommend to continue with in clinic injections of beta interferon. ?Continue with baclofen. ? ? ? ?Anxiety and depression ?Continue with Wellbutrin.  And Zoloft ? ? ? ? ?Hyperlipidemia ?Continue with Lipitor. ? ? ?Chronic right-sided weakness ?Appears to be stable ?Therapy evaluations are pending ? ? ?Hypertension ?Blood pressure are optimal ?Restart home medications.  Continue with losartan ? ? ?Severe hypokalemia and hypomagnesemia ?Replaced repeat levels tomorrow. ? ? ? ?Anemia of chronic  disease ?Hemoglobin on admission around 10.5 probably hemoconcentrated sample. ?Hemoglobin this morning is around 8.8 continue to monitor. ?  ?  ? ?Estimated body mass index is 24.61 kg/m? as calculated from the following: ?  Height as of 11/09/21: '5\' 5"'$  (1.651 m). ?  Weight as of 10/08/21: 67.1 kg. ? ?Code Status: Full code.  ?DVT Prophylaxis:  enoxaparin (LOVENOX) injection 40 mg Start: 02/12/22 2100 ? ? ?Level of Care: Level of care: Telemetry Medical ?Family Communication: none at bedside.  ? ?Disposition Plan:     Remains inpatient appropriate:  pending  ? ?Procedures:  ?MRI brain with and without contrast.  ? ?Consultants:   ?None.  ?Antimicrobials:  ? ?Anti-infectives (From admission, onward)  ? ? Start     Dose/Rate Route Frequency Ordered Stop  ? 02/12/22 1743  Ampicillin-Sulbactam (UNASYN) 3 g in sodium chloride 0.9 % 100 mL IVPB       ? 3 g ?200 mL/hr over 30 Minutes Intravenous Every 6 hours 02/12/22 1713    ? 02/12/22 1545  cefTRIAXone (ROCEPHIN) 1 g in sodium chloride 0.9 % 100 mL IVPB       ? 1 g ?200 mL/hr over 30 Minutes Intravenous  Once 02/12/22 1537 02/12/22 1616  ? ?  ? ? ? ?Medications ? ?Scheduled Meds: ? aspirin EC  81 mg  Oral Daily  ? atorvastatin  40 mg Oral QHS  ? baclofen  10 mg Oral QID  ? buPROPion  300 mg Oral Daily  ? carbamazepine  200 mg Oral TID  ? carvedilol  12.5 mg Oral BID WC  ? docusate sodium  100 mg Oral QODAY  ? enoxaparin (LOVENOX) injection  40 mg Subcutaneous Q24H  ? guaiFENesin  1,200 mg Oral BID  ? ipratropium-albuterol  3 mL Nebulization Q6H  ? losartan  100 mg Oral Q1500  ? multivitamin with minerals  1 tablet Oral Daily  ? omega-3 acid ethyl esters  1 g Oral Daily  ? potassium chloride SA  20 mEq Oral TID  ? sertraline  75 mg Oral Daily  ? ?Continuous Infusions: ? sodium chloride 100 mL/hr at 02/13/22 0740  ? ampicillin-sulbactam (UNASYN) IV 3 g (02/13/22 1236)  ? magnesium sulfate bolus IVPB 2 g (02/13/22 1343)  ? potassium chloride    ? ?PRN  Meds:.traMADol ? ? ? ?Subjective:  ? ?[name] was seen and examined today.  No distress noted. No new complaints.  ?Objective:  ? ?Vitals:  ? 02/12/22 2000 02/12/22 2045 02/13/22 0429 02/13/22 0857  ?BP: (!) 146/55 (!) 135/59 (!) 147/51 (!) 142/55  ?Pulse: (!) 103  90 81  ?Resp: (!) 27 (!) 32 (!) 35 20  ?Temp:  99.9 ?F (37.7 ?C) 99.1 ?F (37.3 ?C) 98 ?F (36.7 ?C)  ?TempSrc:  Axillary Oral Oral  ?SpO2: 96% 94% 97% 97%  ? ? ?Intake/Output Summary (Last 24 hours) at 02/13/2022 1344 ?Last data filed at 02/13/2022 0240 ?Gross per 24 hour  ?Intake 683.46 ml  ?Output 600 ml  ?Net 83.46 ml  ? ?Filed Weights  ? ? ? ?Exam ?General exam: Appears calm and comfortable  ?Respiratory system: Clear to auscultation. Respiratory effort normal. ?Cardiovascular system: S1 & S2 heard, RRR. No JVD,  No pedal edema. ?Gastrointestinal system: Abdomen is nondistended, soft and nontender.  ?Central nervous system: Alert and oriented to self only.  ?Extremities: RLE BKA.  ?Skin: No rashes ?Psychiatry: flat affect.  ? ? ?Data Reviewed:  I have personally reviewed following labs and imaging studies ? ? ?CBC ?Lab Results  ?Component Value Date  ? WBC 10.8 (H) 02/13/2022  ? RBC 2.83 (L) 02/13/2022  ? HGB 8.8 (L) 02/13/2022  ? HCT 27.2 (L) 02/13/2022  ? MCV 96.1 02/13/2022  ? MCH 31.1 02/13/2022  ? PLT 239 02/13/2022  ? MCHC 32.4 02/13/2022  ? RDW 13.1 02/13/2022  ? LYMPHSABS 1.0 02/12/2022  ? MONOABS 0.5 02/12/2022  ? EOSABS 0.2 02/12/2022  ? BASOSABS 0.0 02/12/2022  ? ? ? ?Last metabolic panel ?Lab Results  ?Component Value Date  ? NA 140 02/13/2022  ? K 2.9 (L) 02/13/2022  ? CL 105 02/13/2022  ? CO2 24 02/13/2022  ? BUN 7 (L) 02/13/2022  ? CREATININE 0.45 02/13/2022  ? GLUCOSE 96 02/13/2022  ? GFRNONAA >60 02/13/2022  ? GFRAA 103 06/16/2020  ? CALCIUM 8.5 (L) 02/13/2022  ? PHOS 2.8 06/29/2021  ? PROT 6.5 02/12/2022  ? ALBUMIN 2.6 (L) 02/12/2022  ? LABGLOB 2.5 11/09/2021  ? AGRATIO 1.5 11/09/2021  ? BILITOT 0.5 02/12/2022  ? ALKPHOS 104  02/12/2022  ? AST 34 02/12/2022  ? ALT 17 02/12/2022  ? ANIONGAP 11 02/13/2022  ? ? ?CBG (last 3)  ?No results for input(s): GLUCAP in the last 72 hours.  ? ? ?Coagulation Profile: ?No results for input(s): INR, PROTIME in the last 168 hours. ? ? ?  Radiology Studies: ?DG Chest 1 View ? ?Result Date: 02/12/2022 ?CLINICAL DATA:  Altered mental status EXAM: CHEST  1 VIEW COMPARISON:  10/07/2021 and prior radiographs FINDINGS: UPPER limits normal heart size again noted. Patchy RIGHT LOWER lung opacity is noted. Mild peribronchial thickening and mild LEFT basilar atelectasis/scarring again noted. There is no evidence of pneumothorax or large pleural effusion. No acute bony abnormalities are noted. IMPRESSION: Patchy RIGHT LOWER lung opacity - question pneumonia/infection versus atelectasis. Radiographic follow-up to resolution is recommended. Electronically Signed   By: Margarette Canada M.D.   On: 02/12/2022 13:56  ? ?CT Head Wo Contrast ? ?Result Date: 02/12/2022 ?CLINICAL DATA:  Delirium.  History of multiple sclerosis EXAM: CT HEAD WITHOUT CONTRAST TECHNIQUE: Contiguous axial images were obtained from the base of the skull through the vertex without intravenous contrast. RADIATION DOSE REDUCTION: This exam was performed according to the departmental dose-optimization program which includes automated exposure control, adjustment of the mA and/or kV according to patient size and/or use of iterative reconstruction technique. COMPARISON:  05/07/2021 FINDINGS: Brain: No evidence of acute infarction, hemorrhage, hydrocephalus, extra-axial collection or mass lesion/mass effect. Patchy low-density changes within the periventricular and subcortical white matter compatible with a combination of chronic microvascular ischemic change and demyelination changes. Mild diffuse cerebral volume loss. Vascular: Atherosclerotic calcifications involving the large vessels of the skull base. No unexpected hyperdense vessel. Skull: Normal. Negative  for fracture or focal lesion. Sinuses/Orbits: No acute finding. Other: None. IMPRESSION: 1. No acute intracranial abnormality. 2. Chronic microvascular ischemic change and demyelination changes. Electronically Signed   By: Davina Poke

## 2022-02-13 NOTE — Evaluation (Signed)
Physical Therapy Evaluation ?Patient Details ?Name: [name] ?MRN: [id number] ?DOB: 01-Apr-1946 ?Today's Date: 02/13/2022 ? ?History of Present Illness ? Pt is a 76 y.o. female admitted 5/6 with encephalopathy. PMH: MS with isolated symptoms on interferon therapy, chronic right sided weakness, status post right BKA after a uncontrolled ankle infection, chronic dysphagia on modified diet, HTN, HLD, anxiety/depression, trigeminal neuralgia, memory impairment, neurogenic bladder with recurrent UTIs, dementia ?  ?Clinical Impression ? PT eval complete. Per chart, pt is total assist at baseline. No family present to confirm. Tightness noted with PROM of BUE/LE. Total assist rolling R/L in bed, no active participation from pt. Fixed foot drop noted L and h/o R BKA. Recommend return to home with family assist/care. If family is no longer able to provide needed level of assist/care, LTC should be considered. No further skilled PT intervention indicated. PT signing off. ? ?   ? ?Recommendations for follow up therapy are one component of a multi-disciplinary discharge planning process, led by the attending physician.  Recommendations may be updated based on patient status, additional functional criteria and insurance authorization. ? ?Follow Up Recommendations No PT follow up ? ?  ?Assistance Recommended at Discharge Frequent or constant Supervision/Assistance  ?Patient can return home with the following ?   ? ?  ?Equipment Recommendations None recommended by PT  ?Recommendations for Other Services ?    ?  ?Functional Status Assessment Patient has not had a recent decline in their functional status  ? ?  ?Precautions / Restrictions Precautions ?Precautions: Fall  ? ?  ? ?Mobility ? Bed Mobility ?Overal bed mobility: Needs Assistance ?Bed Mobility: Rolling ?Rolling: Total assist ?  ?  ?  ?  ?General bed mobility comments: no active participation from pt ?  ? ?Transfers ?  ?  ?  ?  ?  ?  ?  ?  ?  ?General transfer comment:  unable, assume bedbound at baseline ?  ? ?Ambulation/Gait ?  ?  ?  ?  ?  ?  ?  ?General Gait Details: nonamb ? ?Stairs ?  ?  ?  ?  ?  ? ?Wheelchair Mobility ?  ? ?Modified Rankin (Stroke Patients Only) ?  ? ?  ? ?Balance   ?  ?  ?  ?  ?  ?  ?  ?  ?  ?  ?  ?  ?  ?  ?  ?  ?  ?  ?   ? ? ? ?Pertinent Vitals/Pain Pain Assessment ?Pain Assessment: Faces ?Faces Pain Scale: Hurts even more ?Pain Location: generalized with all ROM/mobility ?Pain Descriptors / Indicators: Grimacing, Guarding, Discomfort ?Pain Intervention(s): Limited activity within patient's tolerance  ? ? ?Home Living Family/patient expects to be discharged to:: Private residence ?Living Arrangements: Children ?Available Help at Discharge: Family ?Type of Home: House ?Home Access: Ramped entrance ?  ?  ?  ?Home Layout: One level ?Home Equipment: Wheelchair - Publishing copy (2 wheels);Tub bench;BSC/3in1 ?Additional Comments: Home set up taken from previous admission.  ?  ?Prior Function Prior Level of Function : Needs assist ?  ?  ?  ?  ?  ?  ?Mobility Comments: No family present. Per chart and previous PT evals, pt is total assist at baseline. ?ADLs Comments: Pt feeding herself a cracker on entry. Would assume total assist bathing, dressing, toileting ?  ? ? ?Hand Dominance  ? Dominant Hand: Right ? ?  ?Extremity/Trunk Assessment  ? Upper Extremity Assessment ?Upper Extremity Assessment: Generalized weakness;RUE  deficits/detail ?RUE Deficits / Details: < 90 degrees at shoulder. Tightness noted in flexors ?RUE: Shoulder pain with ROM ?  ? ?Lower Extremity Assessment ?Lower Extremity Assessment: Generalized weakness;RLE deficits/detail;LLE deficits/detail ?RLE Deficits / Details: h/o BKA, stiff/painful PROM ?LLE Deficits / Details: stiff/painful PROM, significant fixed foot drop ?  ? ?Cervical / Trunk Assessment ?Cervical / Trunk Assessment: Kyphotic  ?Communication  ? Communication: HOH  ?Cognition Arousal/Alertness: Awake/alert ?Behavior During  Therapy: Flat affect ?Overall Cognitive Status: No family/caregiver present to determine baseline cognitive functioning ?  ?  ?  ?  ?  ?  ?  ?  ?  ?  ?  ?  ?  ?  ?  ?  ?General Comments: Oriented to self only. Very apathetic. Says 'huh' to all questions asked. Unsure if related to hearing or cognition. ?  ?  ? ?  ?General Comments   ? ?  ?Exercises    ? ?Assessment/Plan  ?  ?PT Assessment Patient does not need any further PT services  ?PT Problem List   ? ?   ?  ?PT Treatment Interventions     ? ?PT Goals (Current goals can be found in the Care Plan section)  ?Acute Rehab PT Goals ?Patient Stated Goal: not stated ?PT Goal Formulation: All assessment and education complete, DC therapy ? ?  ?Frequency   ?  ? ? ?Co-evaluation   ?  ?  ?  ?  ? ? ?  ?AM-PAC PT "6 Clicks" Mobility  ?Outcome Measure Help needed turning from your back to your side while in a flat bed without using bedrails?: Total ?Help needed moving from lying on your back to sitting on the side of a flat bed without using bedrails?: Total ?Help needed moving to and from a bed to a chair (including a wheelchair)?: Total ?Help needed standing up from a chair using your arms (e.g., wheelchair or bedside chair)?: Total ?Help needed to walk in hospital room?: Total ?Help needed climbing 3-5 steps with a railing? : Total ?6 Click Score: 6 ? ?  ?End of Session   ?Activity Tolerance: Patient limited by pain ?Patient left: in bed;with call bell/phone within reach;with bed alarm set ?Nurse Communication: Mobility status ?PT Visit Diagnosis: Other abnormalities of gait and mobility (R26.89) ?  ? ?Time: 7353-2992 ?PT Time Calculation (min) (ACUTE ONLY): 15 min ? ? ?Charges:   PT Evaluation ?$PT Eval Moderate Complexity: 1 Mod ?  ?  ?   ? ? ?[name], PT  ?Office # [phone] ?Pager [phone] ? ? ?[name] ?02/13/2022, 2:36 PM ? ?

## 2022-02-14 ENCOUNTER — Inpatient Hospital Stay (HOSPITAL_COMMUNITY): Payer: Medicare HMO

## 2022-02-14 DIAGNOSIS — J690 Pneumonitis due to inhalation of food and vomit: Secondary | ICD-10-CM | POA: Diagnosis not present

## 2022-02-14 DIAGNOSIS — G35 Multiple sclerosis: Secondary | ICD-10-CM | POA: Diagnosis not present

## 2022-02-14 DIAGNOSIS — G9340 Encephalopathy, unspecified: Secondary | ICD-10-CM | POA: Diagnosis not present

## 2022-02-14 LAB — CBC WITH DIFFERENTIAL/PLATELET
Abs Immature Granulocytes: 0.02 10*3/uL (ref 0.00–0.07)
Basophils Absolute: 0 10*3/uL (ref 0.0–0.1)
Basophils Relative: 0 %
Eosinophils Absolute: 0.2 10*3/uL (ref 0.0–0.5)
Eosinophils Relative: 3 %
HCT: 25.1 % — ABNORMAL LOW (ref 36.0–46.0)
Hemoglobin: 8.1 g/dL — ABNORMAL LOW (ref 12.0–15.0)
Immature Granulocytes: 0 %
Lymphocytes Relative: 23 %
Lymphs Abs: 1.5 10*3/uL (ref 0.7–4.0)
MCH: 30.9 pg (ref 26.0–34.0)
MCHC: 32.3 g/dL (ref 30.0–36.0)
MCV: 95.8 fL (ref 80.0–100.0)
Monocytes Absolute: 0.6 10*3/uL (ref 0.1–1.0)
Monocytes Relative: 9 %
Neutro Abs: 4.4 10*3/uL (ref 1.7–7.7)
Neutrophils Relative %: 65 %
Platelets: 212 10*3/uL (ref 150–400)
RBC: 2.62 MIL/uL — ABNORMAL LOW (ref 3.87–5.11)
RDW: 13 % (ref 11.5–15.5)
WBC: 6.8 10*3/uL (ref 4.0–10.5)
nRBC: 0 % (ref 0.0–0.2)

## 2022-02-14 LAB — BASIC METABOLIC PANEL
Anion gap: 5 (ref 5–15)
BUN: 7 mg/dL — ABNORMAL LOW (ref 8–23)
CO2: 22 mmol/L (ref 22–32)
Calcium: 8.1 mg/dL — ABNORMAL LOW (ref 8.9–10.3)
Chloride: 109 mmol/L (ref 98–111)
Creatinine, Ser: 0.49 mg/dL (ref 0.44–1.00)
GFR, Estimated: >60 mL/min (ref >60)
Glucose, Bld: 107 mg/dL — ABNORMAL HIGH (ref 70–99)
Potassium: 3.2 mmol/L — ABNORMAL LOW (ref 3.5–5.1)
Sodium: 136 mmol/L (ref 135–145)

## 2022-02-14 LAB — MAGNESIUM: Magnesium: 1.8 mg/dL (ref 1.7–2.4)

## 2022-02-14 MED ORDER — ENSURE ENLIVE PO LIQD
237.0000 mL | Freq: Three times a day (TID) | ORAL | Status: DC
  Administered 2022-02-14 – 2022-02-16 (×7): 237 mL via ORAL

## 2022-02-14 MED ORDER — IPRATROPIUM-ALBUTEROL 0.5-2.5 (3) MG/3ML IN SOLN
3.0000 mL | Freq: Two times a day (BID) | RESPIRATORY_TRACT | Status: DC
  Administered 2022-02-15 – 2022-02-16 (×3): 3 mL via RESPIRATORY_TRACT
  Filled 2022-02-14 (×3): qty 3

## 2022-02-14 MED ORDER — POTASSIUM CHLORIDE CRYS ER 20 MEQ PO TBCR
40.0000 meq | EXTENDED_RELEASE_TABLET | Freq: Once | ORAL | Status: AC
  Administered 2022-02-14: 40 meq via ORAL

## 2022-02-14 NOTE — Progress Notes (Signed)
Initial Nutrition Assessment ? ?DOCUMENTATION CODES:  ? ?Not applicable ? ?INTERVENTION:  ? ?Chocolate Ensure Enlive po TID, each supplement provides 350 kcal and 20 grams of protein. ? ?Continue MVI with minerals daily. ? ?NUTRITION DIAGNOSIS:  ? ?Increased nutrient needs related to wound healing as evidenced by estimated needs. ? ?GOAL:  ? ?Patient will meet greater than or equal to 90% of their needs ? ?MONITOR:  ? ?PO intake, Supplement acceptance, Skin ? ?REASON FOR ASSESSMENT:  ? ?Consult ?Assessment of nutrition requirement/status ? ?ASSESSMENT:  ? ?76 yo female admitted for acute confusion and unresponsiveness. Chest CT showed aspiration PNA. PMH includes MS, chronic right sided weakness, R BKA, chronic dysphagia, HTN, HLD, trigeminal neuralgia, memory impairment. ? ?S/P bedside swallow evaluation with SLP 5/7 with recommendation for dysphagia 3 diet and thin liquids. ?Patient reports that she has a good appetite. ?Per nurse tech, patient refused breakfast, but ate ~40% of lunch today. ?Patient was unable to provide much nutrition hx, but says that she likes chocolate Ensure.  ? ?Labs reviewed. K 3.2 ?Medications reviewed and include Colace, MVI with minerals. ? ?Weight history reviewed. Most recent weight available is 67.1 kg from December 2022. Patient had 9% weight loss from September to December 2022.  ? ?Mild depletion of muscle mass is likely related to muscle loss with MS.  ? ?NUTRITION - FOCUSED PHYSICAL EXAM: ? ?Flowsheet Row Most Recent Value  ?Orbital Region No depletion  ?Upper Arm Region No depletion  ?Thoracic and Lumbar Region No depletion  ?Buccal Region No depletion  ?Temple Region Mild depletion  ?Clavicle Bone Region Mild depletion  ?Clavicle and Acromion Bone Region Mild depletion  ?Scapular Bone Region Unable to assess  ?Dorsal Hand Mild depletion  ?Patellar Region Mild depletion  ?Anterior Thigh Region Mild depletion  ?Posterior Calf Region Mild depletion  ?Edema (RD Assessment) None   ?Hair Reviewed  ?Eyes Reviewed  ?Mouth Reviewed  ?Skin Reviewed  ?Nails Reviewed  ? ?  ? ? ? ?Diet Order:   ?Diet Order   ? ?       ?  DIET DYS 3 Room service appropriate? No; Fluid consistency: Thin  Diet effective now       ?  ? ?  ?  ? ?  ? ? ?EDUCATION NEEDS:  ? ?No education needs have been identified at this time ? ?Skin:  Skin Assessment: Skin Integrity Issues: ?Skin Integrity Issues:: Stage II ?Stage II: sacrum (from 06/25/21) ? ?Last BM:  5/5 ? ?Height:  ? ?Ht Readings from Last 1 Encounters:  ?11/09/21 '5\' 5"'$  (1.651 m)  ? ? ?Weight:  ? ?Wt Readings from Last 1 Encounters:  ?10/08/21 67.1 kg  ? ? ?BMI:  There is no height or weight on file to calculate BMI. ? ?Estimated Nutritional Needs:  ? ?Kcal:  1750-1950 ? ?Protein:  85-95 gm ? ?Fluid:  >/= 1.8 L ? ? ? ?Lucas Mallow RD, LDN, CNSC ?Please refer to Amion for contact information.                                                       ? ?

## 2022-02-14 NOTE — Progress Notes (Signed)
? ? ? [facility] ?                                                                            ? ? ?[name], is a 76 y.o. female, DOB - 23-Aug-1946, GUR:[id number] ?Admit date - 02/12/2022    ?Outpatient Primary MD for the patient is Glenis Smoker, MD ? ?LOS - 2  days ? ? ? ?Brief summary  ? ? ?[name] is a 76 y.o. 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 ? CT chest showed right lower lobe infiltrates and consolidation compatible with aspiration pneumonia. ?CT head showed no acute abnormality, only demyelinating disease.  ?She was started on broad spectrum IV antibiotics.  ?PT seen and examined. No events overnight.  ? ?Assessment & Plan  ? ? ?Assessment and Plan: ? ? ? ?Acute metabolic encephalopathy probably secondary to aspiration pneumonia. ?Patient is more alert today and following simple commands and answering simple questions. ?MRI of the brain with and without contrast are ordered for evaluation of MS flareup. ?She appears to be back to her baseline.  ? ? ? ?Aspiration pneumonia ?SLP evaluation ordered , currently on soft diet.  ?Continue with IV antibiotics. ?Wbc count normalized, no fever overnight.  ? ?Multiple sclerosis with associated dysphagia ?Recommend to continue with in clinic injections of beta interferon. ?Continue with baclofen. ? ? ? ?Anxiety and depression ?Continue with Wellbutrin.  And Zoloft ? ? ? ? ?Hyperlipidemia ?Continue with Lipitor. ? ? ?Chronic right-sided weakness ?Appears to be stable ?Therapy evaluations are pending ? ? ?Hypertension ?Blood pressure are optimal ?Restart home medications.  Continue with losartan ? ? ?Severe  hypokalemia and hypomagnesemia ?Replaced. Repeat K in am.  ? ? ? ?Anemia of chronic disease ?Hemoglobin on admission around 10.5 probably hemoconcentrated sample. ?Hemoglobin stable around 8.  ?  ?  ? ?Estimated body mass index is 24.61 kg/m? as calculated from the following: ?  Height as of 11/09/21: '5\' 5"'$  (1.651 m). ?  Weight as of 10/08/21: 67.1 kg. ? ?Code Status: Full code.  ?DVT Prophylaxis:  enoxaparin (LOVENOX) injection 40 mg Start: 02/12/22 2100 ? ? ?Level of Care: Level of care: Telemetry Medical ?Family Communication: none at bedside.  ? ?Disposition Plan:     Remains inpatient appropriate:  pending  ? ?Procedures:  ?MRI brain with and without contrast.  ? ?Consultants:   ?None.  ?Antimicrobials:  ? ?Anti-infectives (From admission, onward)  ? ? Start     Dose/Rate Route Frequency Ordered Stop  ? 02/12/22 1743  Ampicillin-Sulbactam (UNASYN) 3 g in sodium chloride 0.9 % 100 mL IVPB       ? 3 g ?200 mL/hr over 30 Minutes Intravenous Every 6 hours 02/12/22 1713    ? 02/12/22 1545  cefTRIAXone (ROCEPHIN) 1 g in sodium chloride 0.9 % 100 mL IVPB       ? 1 g ?200 mL/hr over 30 Minutes Intravenous  Once 02/12/22 1537 02/12/22 1616  ? ?  ? ? ? ?  Medications ? ?Scheduled Meds: ? aspirin EC  81 mg Oral Daily  ? atorvastatin  40 mg Oral QHS  ? baclofen  10 mg Oral QID  ? buPROPion  300 mg Oral Daily  ? carbamazepine  200 mg Oral TID  ? carvedilol  12.5 mg Oral BID WC  ? docusate sodium  100 mg Oral QODAY  ? enoxaparin (LOVENOX) injection  40 mg Subcutaneous Q24H  ? guaiFENesin  1,200 mg Oral BID  ? ipratropium-albuterol  3 mL Nebulization Q6H  ? losartan  100 mg Oral Q1500  ? multivitamin with minerals  1 tablet Oral Daily  ? omega-3 acid ethyl esters  1 g Oral Daily  ? potassium chloride  40 mEq Oral Once  ? sertraline  75 mg Oral Daily  ? ?Continuous Infusions: ? ampicillin-sulbactam (UNASYN) IV 3 g (02/14/22 0537)  ? ?PRN Meds:.traMADol ? ? ? ?Subjective:  ? ?[name] was seen and examined today.  Comfortable, answering simple questions.  ?Objective:  ? ?Vitals:  ? 02/14/22 0100 02/14/22 0201 02/14/22 0500 02/14/22 0733  ?BP:   (!) 142/61 (!) 120/92  ?Pulse: 90 90 86 88  ?Resp: '16 16 18 17  '$ ?Temp:   98.6 ?F (37 ?C) 97.6 ?F (36.4 ?C)  ?TempSrc:   Oral Oral  ?SpO2: 99% 99% 99% 100%  ? ? ?Intake/Output Summary (Last 24 hours) at 02/14/2022 0933 ?Last data filed at 02/13/2022 2208 ?Gross per 24 hour  ?Intake 240 ml  ?Output --  ?Net 240 ml  ? ? ?Filed Weights  ? ? ? ?Exam ?General exam: chronically ill appearing lady not in distress.  ?Respiratory system: Clear to auscultation. Respiratory effort normal. ?Cardiovascular system: S1 & S2 heard, RRR. No JVD, . No pedal edema. ?Gastrointestinal system: Abdomen is nondistended, soft and nontender. Normal bowel sounds heard. ?Central nervous system: Alert and oriented to self only.  ?Extremities right BKA.  ?Skin: No rashes, ?Psychiatry:  Mood & affect appropriate.  ? ? ? ?Data Reviewed:  I have personally reviewed following labs and imaging studies ? ? ?CBC ?Lab Results  ?Component Value Date  ? WBC 6.8 02/14/2022  ? RBC 2.62 (L) 02/14/2022  ? HGB 8.1 (L) 02/14/2022  ? HCT 25.1 (L) 02/14/2022  ? MCV 95.8 02/14/2022  ? MCH 30.9 02/14/2022  ? PLT 212 02/14/2022  ? MCHC 32.3 02/14/2022  ? RDW 13.0 02/14/2022  ? LYMPHSABS 1.5 02/14/2022  ? MONOABS 0.6 02/14/2022  ? EOSABS 0.2 02/14/2022  ? BASOSABS 0.0 02/14/2022  ? ? ? ?Last metabolic panel ?Lab Results  ?Component Value Date  ? NA 136 02/14/2022  ? K 3.2 (L) 02/14/2022  ? CL 109 02/14/2022  ? CO2 22 02/14/2022  ? BUN 7 (L) 02/14/2022  ? CREATININE 0.49 02/14/2022  ? GLUCOSE 107 (H) 02/14/2022  ? GFRNONAA >60 02/14/2022  ? GFRAA 103 06/16/2020  ? CALCIUM 8.1 (L) 02/14/2022  ? PHOS 2.8 06/29/2021  ? PROT 6.5 02/12/2022  ? ALBUMIN 2.6 (L) 02/12/2022  ? LABGLOB 2.5 11/09/2021  ? AGRATIO 1.5 11/09/2021  ? BILITOT 0.5 02/12/2022  ? ALKPHOS 104 02/12/2022  ? AST 34 02/12/2022  ? ALT 17 02/12/2022  ? ANIONGAP 5 02/14/2022  ? ? ?CBG  (last 3)  ?No results for input(s): GLUCAP in the last 72 hours.  ? ? ?Coagulation Profile: ?No results for input(s): INR, PROTIME in the last 168 hours. ? ? ?Radiology Studies: ?DG Chest 1 View ? ?Result Date: 02/12/2022 ?CLINICAL DATA:  Altered mental status EXAM: CHEST  1 VIEW COMPARISON:  10/07/2021 and prior radiographs FINDINGS: UPPER limits normal heart size again noted. Patchy RIGHT LOWER lung opacity is noted. Mild peribronchial thickening and mild LEFT basilar atelectasis/scarring again noted. There is no evidence of pneumothorax or large pleural effusion. No acute bony abnormalities are noted. IMPRESSION: Patchy RIGHT LOWER lung opacity - question pneumonia/infection versus atelectasis. Radiographic follow-up to resolution is recommended. Electronically Signed   By: Margarette Canada M.D.   On: 02/12/2022 13:56  ? ?CT Head Wo Contrast ? ?Result Date: 02/12/2022 ?CLINICAL DATA:  Delirium.  History of multiple sclerosis EXAM: CT HEAD WITHOUT CONTRAST TECHNIQUE: Contiguous axial images were obtained from the base of the skull through the vertex without intravenous contrast. RADIATION DOSE REDUCTION: This exam was performed according to the departmental dose-optimization program which includes automated exposure control, adjustment of the mA and/or kV according to patient size and/or use of iterative reconstruction technique. COMPARISON:  05/07/2021 FINDINGS: Brain: No evidence of acute infarction, hemorrhage, hydrocephalus, extra-axial collection or mass lesion/mass effect. Patchy low-density changes within the periventricular and subcortical white matter compatible with a combination of chronic microvascular ischemic change and demyelination changes. Mild diffuse cerebral volume loss. Vascular: Atherosclerotic calcifications involving the large vessels of the skull base. No unexpected hyperdense vessel. Skull: Normal. Negative for fracture or focal lesion. Sinuses/Orbits: No acute finding. Other: None. IMPRESSION: 1.  No acute intracranial abnormality. 2. Chronic microvascular ischemic change and demyelination changes. Electronically Signed   By: Davina Poke D.O.   On: 02/12/2022 13:40  ? ?CT Chest W Contrast ? ?Result Date: 5

## 2022-02-15 DIAGNOSIS — G35 Multiple sclerosis: Secondary | ICD-10-CM | POA: Diagnosis not present

## 2022-02-15 DIAGNOSIS — G9340 Encephalopathy, unspecified: Secondary | ICD-10-CM | POA: Diagnosis not present

## 2022-02-15 DIAGNOSIS — J690 Pneumonitis due to inhalation of food and vomit: Secondary | ICD-10-CM | POA: Diagnosis not present

## 2022-02-15 LAB — BASIC METABOLIC PANEL
Anion gap: 11 (ref 5–15)
BUN: <5 mg/dL — ABNORMAL LOW (ref 8–23)
CO2: 23 mmol/L (ref 22–32)
Calcium: 8 mg/dL — ABNORMAL LOW (ref 8.9–10.3)
Chloride: 103 mmol/L (ref 98–111)
Creatinine, Ser: 0.47 mg/dL (ref 0.44–1.00)
GFR, Estimated: >60 mL/min (ref >60)
Glucose, Bld: 93 mg/dL (ref 70–99)
Potassium: 2.7 mmol/L — CL (ref 3.5–5.1)
Sodium: 137 mmol/L (ref 135–145)

## 2022-02-15 LAB — POTASSIUM: Potassium: 4 mmol/L (ref 3.5–5.1)

## 2022-02-15 LAB — MAGNESIUM: Magnesium: 1.6 mg/dL — ABNORMAL LOW (ref 1.7–2.4)

## 2022-02-15 MED ORDER — INTERFERON BETA-1A 30 MCG/0.5ML IM PSKT
30.0000 ug | PREFILLED_SYRINGE | INTRAMUSCULAR | Status: DC
  Administered 2022-02-16: 30 ug via INTRAMUSCULAR
  Filled 2022-02-15: qty 0.5

## 2022-02-15 MED ORDER — POTASSIUM CHLORIDE 10 MEQ/100ML IV SOLN
10.0000 meq | INTRAVENOUS | Status: AC
  Administered 2022-02-15 (×2): 10 meq via INTRAVENOUS
  Filled 2022-02-15: qty 100

## 2022-02-15 MED ORDER — HYDRALAZINE HCL 25 MG PO TABS
25.0000 mg | ORAL_TABLET | Freq: Three times a day (TID) | ORAL | Status: DC | PRN
  Administered 2022-02-15: 25 mg via ORAL
  Filled 2022-02-15: qty 1

## 2022-02-15 MED ORDER — POTASSIUM CHLORIDE CRYS ER 20 MEQ PO TBCR
40.0000 meq | EXTENDED_RELEASE_TABLET | Freq: Three times a day (TID) | ORAL | Status: DC
Start: 2022-02-15 — End: 2022-02-16
  Administered 2022-02-15 (×3): 40 meq via ORAL
  Filled 2022-02-15 (×3): qty 2

## 2022-02-15 MED ORDER — AMOXICILLIN-POT CLAVULANATE 875-125 MG PO TABS
1.0000 | ORAL_TABLET | Freq: Two times a day (BID) | ORAL | Status: DC
  Administered 2022-02-16: 1 via ORAL
  Filled 2022-02-15: qty 1

## 2022-02-15 NOTE — Care Management Important Message (Signed)
Important Message ? ?Patient Details  ?Name: [name] ?MRN: [id number] ?Date of Birth: [birth year] ? ? ?Medicare Important Message Given:  Yes ? ? ? ? ?Travin Marik ?02/15/2022, 2:42 PM ?

## 2022-02-15 NOTE — Progress Notes (Signed)
Date and time results received: 02/15/22 0839 ?(use smartphrase ".now" to insert current time) ? ?Test: potassium ?Critical Value: 2.7 ? ?Name of Provider Notified: Hosie Poisson MD ? ? ?Orders Received? Or Actions Taken?:  ?

## 2022-02-15 NOTE — Progress Notes (Signed)
? ? ? [facility] ?                                                                            ? ? ?[name], is a 76 y.o. female, DOB - 1946/07/26, [id number] ?Admit date - 02/12/2022    ?Outpatient Primary MD for the patient is Glenis Smoker, MD ? ?LOS - 3  days ? ? ? ?Brief summary  ? ? ?[name] is a 76 y.o. 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 ? CT chest showed right lower lobe infiltrates and consolidation compatible with aspiration pneumonia. ?CT head showed no acute abnormality, only demyelinating disease.  ?She was started on broad spectrum IV antibiotics. Transition to oral antibiotics and plan for discharge home in the morning.  ? ?Assessment & Plan  ? ? ?Assessment and Plan: ? ? ? ?Acute metabolic encephalopathy probably secondary to aspiration pneumonia. ?Patient is more alert today and following simple commands and answering simple questions. ?She appears to be back to her baseline.she recently had an MRI of the Brain in 09/2021.  ? ? ?Aspiration pneumonia ?SLP evaluation ordered , currently on soft diet.  ?Wbc count normalized, no fever overnight.  ?Breathing better, no oxygen requirement. Will need SLP follow up on discharge.  ?Transition to oral antibiotics in am and discharge.  ? ?Multiple sclerosis with associated dysphagia ?Recommend to continue with in clinic injections of beta interferon. ?Continue with baclofen. ? ? ? ?Anxiety and depression ?Continue with Wellbutrin.  And Zoloft ? ? ? ? ?Hyperlipidemia ?Continue with Lipitor. ? ? ?Chronic right-sided weakness ?Appears to be stable ?Therapy evaluations noted.  ? ? ?Hypertension ?BP  parameters are sub optimal. Will add hydralazine prn.  ? ? ?Severe hypokalemia and hypomagnesemia ?REplaced. Recheck levels in am.  ? ? ? ?Anemia of chronic disease ?Hemoglobin on admission around 10.5 probably hemoconcentrated sample. ?Hemoglobin stable around 8.  ?  ?Interventions: Ensure Enlive (each supplement provides 350kcal and 20 grams of protein), MVI ? ?Estimated body mass index is 24.61 kg/m? as calculated from the following: ?  Height as of 11/09/21: '5\' 5"'$  (1.651 m). ?  Weight as of 10/08/21: 67.1 kg. ? ?Code Status: Full code.  ?DVT Prophylaxis:  enoxaparin (LOVENOX) injection 40 mg Start: 02/12/22 2100 ? ? ?Level of Care: Level of care: Telemetry Medical ?Family Communication: none at bedside.  ? ?Disposition Plan:     Remains inpatient appropriate:  IV antibiotics.  ? ?Procedures:  ?MRI brain with and without contrast.  ? ?Consultants:   ?None.  ?Antimicrobials:  ? ?Anti-infectives (From admission, onward)  ? ? Start     Dose/Rate Route Frequency Ordered Stop  ? 02/12/22 1743  Ampicillin-Sulbactam (UNASYN) 3 g in sodium chloride 0.9 % 100 mL IVPB       ? 3 g ?200 mL/hr over 30 Minutes Intravenous Every 6 hours 02/12/22 1713    ? 02/12/22 1545  cefTRIAXone (ROCEPHIN) 1 g in sodium chloride  0.9 % 100 mL IVPB       ? 1 g ?200 mL/hr over 30 Minutes Intravenous  Once 02/12/22 1537 02/12/22 1616  ? ?  ? ? ? ?Medications ? ?Scheduled Meds: ? aspirin EC  81 mg Oral Daily  ? atorvastatin  40 mg Oral QHS  ? baclofen  10 mg Oral QID  ? buPROPion  300 mg Oral Daily  ? carbamazepine  200 mg Oral TID  ? carvedilol  12.5 mg Oral BID WC  ? docusate sodium  100 mg Oral QODAY  ? enoxaparin (LOVENOX) injection  40 mg Subcutaneous Q24H  ? feeding supplement  237 mL Oral TID BM  ? guaiFENesin  1,200 mg Oral BID  ? ipratropium-albuterol  3 mL Nebulization BID  ? losartan  100 mg Oral Q1500  ? multivitamin with minerals  1 tablet Oral Daily  ? omega-3 acid ethyl esters  1 g Oral Daily  ? potassium chloride  40 mEq Oral TID   ? sertraline  75 mg Oral Daily  ? ?Continuous Infusions: ? ampicillin-sulbactam (UNASYN) IV 3 g (02/15/22 1313)  ? ?PRN Meds:.traMADol ? ? ? ?Subjective:  ? ?[name] was seen and examined today. Able to answer simple questions.  ?Objective:  ? ?Vitals:  ? 02/15/22 0510 02/15/22 0514 02/15/22 0807 02/15/22 1303  ?BP: (!) 171/67 (!) 148/71 (!) 163/65 (!) 160/73  ?Pulse: 97 92 93 96  ?Resp: '18 18 17 17  '$ ?Temp: 98.6 ?F (37 ?C)  99.3 ?F (37.4 ?C) 99 ?F (37.2 ?C)  ?TempSrc: Oral  Oral Oral  ?SpO2: 99%  99% 99%  ? ? ?Intake/Output Summary (Last 24 hours) at 02/15/2022 1429 ?Last data filed at 02/15/2022 1002 ?Gross per 24 hour  ?Intake 747 ml  ?Output 200 ml  ?Net 547 ml  ? ? ?Filed Weights  ? ? ? ?Exam ?General exam: chronically ill appearing lady not in distress.  ?Respiratory system: Clear to auscultation. Respiratory effort normal. ?Cardiovascular system: S1 & S2 heard, RRR. No JVD,  No pedal edema. ?Gastrointestinal system: Abdomen is nondistended, soft and nontender. Normal bowel sounds heard. ?Central nervous system: Alert , oriented to person only.  ?Extremities: right BKA.  ?Skin: No rashes seen.  ?Psychiatry: Mood is appropriate.  ? ? ? ? ?Data Reviewed:  I have personally reviewed following labs and imaging studies ? ? ?CBC ?Lab Results  ?Component Value Date  ? WBC 6.8 02/14/2022  ? RBC 2.62 (L) 02/14/2022  ? HGB 8.1 (L) 02/14/2022  ? HCT 25.1 (L) 02/14/2022  ? MCV 95.8 02/14/2022  ? MCH 30.9 02/14/2022  ? PLT 212 02/14/2022  ? MCHC 32.3 02/14/2022  ? RDW 13.0 02/14/2022  ? LYMPHSABS 1.5 02/14/2022  ? MONOABS 0.6 02/14/2022  ? EOSABS 0.2 02/14/2022  ? BASOSABS 0.0 02/14/2022  ? ? ? ?Last metabolic panel ?Lab Results  ?Component Value Date  ? NA 137 02/15/2022  ? K 2.7 (LL) 02/15/2022  ? CL 103 02/15/2022  ? CO2 23 02/15/2022  ? BUN <5 (L) 02/15/2022  ? CREATININE 0.47 02/15/2022  ? GLUCOSE 93 02/15/2022  ? GFRNONAA >60 02/15/2022  ? GFRAA 103 06/16/2020  ? CALCIUM 8.0 (L) 02/15/2022  ? PHOS 2.8 06/29/2021   ? PROT 6.5 02/12/2022  ? ALBUMIN 2.6 (L) 02/12/2022  ? LABGLOB 2.5 11/09/2021  ? AGRATIO 1.5 11/09/2021  ? BILITOT 0.5 02/12/2022  ? ALKPHOS 104 02/12/2022  ? AST 34 02/12/2022  ? ALT 17 02/12/2022  ? ANIONGAP 11 02/15/2022  ? ? ?  CBG (last 3)  ?No results for input(s): GLUCAP in the last 72 hours.  ? ? ?Coagulation Profile: ?No results for input(s): INR, PROTIME in the last 168 hours. ? ? ?Radiology Studies: ?No results found. ? ? ? ? ?Hosie Poisson M.D. ?[facility] ?02/15/2022, 2:29 PM ? ?Available via Epic secure chat 7am-7pm ?After 7 pm, please refer to night coverage provider listed on amion. ? ? ? ?

## 2022-02-16 ENCOUNTER — Other Ambulatory Visit (HOSPITAL_COMMUNITY): Payer: Self-pay

## 2022-02-16 DIAGNOSIS — E876 Hypokalemia: Secondary | ICD-10-CM

## 2022-02-16 DIAGNOSIS — L89152 Pressure ulcer of sacral region, stage 2: Secondary | ICD-10-CM | POA: Diagnosis present

## 2022-02-16 DIAGNOSIS — G35 Multiple sclerosis: Secondary | ICD-10-CM | POA: Diagnosis not present

## 2022-02-16 DIAGNOSIS — J690 Pneumonitis due to inhalation of food and vomit: Secondary | ICD-10-CM | POA: Diagnosis not present

## 2022-02-16 DIAGNOSIS — G9340 Encephalopathy, unspecified: Secondary | ICD-10-CM | POA: Diagnosis not present

## 2022-02-16 LAB — CBC
HCT: 30 % — ABNORMAL LOW (ref 36.0–46.0)
Hemoglobin: 9.8 g/dL — ABNORMAL LOW (ref 12.0–15.0)
MCH: 30.9 pg (ref 26.0–34.0)
MCHC: 32.7 g/dL (ref 30.0–36.0)
MCV: 94.6 fL (ref 80.0–100.0)
Platelets: 251 10*3/uL (ref 150–400)
RBC: 3.17 MIL/uL — ABNORMAL LOW (ref 3.87–5.11)
RDW: 12.9 % (ref 11.5–15.5)
WBC: 7.3 10*3/uL (ref 4.0–10.5)
nRBC: 0 % (ref 0.0–0.2)

## 2022-02-16 LAB — BASIC METABOLIC PANEL
Anion gap: 8 (ref 5–15)
BUN: <5 mg/dL — ABNORMAL LOW (ref 8–23)
CO2: 22 mmol/L (ref 22–32)
Calcium: 8.5 mg/dL — ABNORMAL LOW (ref 8.9–10.3)
Chloride: 107 mmol/L (ref 98–111)
Creatinine, Ser: 0.43 mg/dL — ABNORMAL LOW (ref 0.44–1.00)
GFR, Estimated: >60 mL/min (ref >60)
Glucose, Bld: 101 mg/dL — ABNORMAL HIGH (ref 70–99)
Potassium: 4.4 mmol/L (ref 3.5–5.1)
Sodium: 137 mmol/L (ref 135–145)

## 2022-02-16 LAB — MAGNESIUM: Magnesium: 1.6 mg/dL — ABNORMAL LOW (ref 1.7–2.4)

## 2022-02-16 MED ORDER — MAGNESIUM SULFATE 4 GM/100ML IV SOLN
4.0000 g | Freq: Once | INTRAVENOUS | Status: AC
  Administered 2022-02-16: 4 g via INTRAVENOUS
  Filled 2022-02-16: qty 100

## 2022-02-16 MED ORDER — GUAIFENESIN ER 600 MG PO TB12
1200.0000 mg | ORAL_TABLET | Freq: Two times a day (BID) | ORAL | 0 refills | Status: AC
  Filled 2022-02-16: qty 16, 4d supply, fill #0

## 2022-02-16 MED ORDER — IPRATROPIUM-ALBUTEROL 0.5-2.5 (3) MG/3ML IN SOLN: 3.0000 mL | Freq: Four times a day (QID) | RESPIRATORY_TRACT | Status: DC | PRN

## 2022-02-16 MED ORDER — AMOXICILLIN-POT CLAVULANATE 875-125 MG PO TABS
1.0000 | ORAL_TABLET | Freq: Two times a day (BID) | ORAL | 0 refills | Status: AC
  Filled 2022-02-16: qty 8, 4d supply, fill #0

## 2022-02-16 MED ORDER — MAGNESIUM OXIDE -MG SUPPLEMENT 400 (240 MG) MG PO TABS: 400.0000 mg | ORAL_TABLET | Freq: Two times a day (BID) | ORAL | Status: DC

## 2022-02-16 MED ORDER — MAGNESIUM OXIDE 400 MG PO TABS
400.0000 mg | ORAL_TABLET | Freq: Two times a day (BID) | ORAL | 0 refills | Status: AC
  Filled 2022-02-16: qty 8, 4d supply, fill #0

## 2022-02-16 NOTE — TOC Transition Note (Addendum)
Transition of Care (TOC) - CM/SW Discharge Note ? ? ?Patient Details  ?Name: [name] ?MRN: [id number] ?Date of Birth: 22-Aug-1946 ? ?Transition of Care [facility]) CM/SW Contact:  ?Sharin Mons, RN ?Phone Number: ?02/16/2022, 3:42 PM ? ? ?Clinical Narrative:    ?Patient will DC to: home ?Anticipated DC date: 02/16/2022 ?Family notified: yes, son ?Transport by: car ? ? ?Per MD patient ready for DC today . RN, patient, patient's family, and [facility] notified of DC.   ?TOC pharmacy will deliver Rx meds to bedside prior to d/c. ?Post hospital f/u noted on AVS. ?Son to provide  transportation to home. ? ? [name] (Son)       ? [phone]     ? ?RNCM will sign off for now as intervention is no longer needed. Please consult [place] again if new needs arise.  ? ?Final next level of care: [place] ?Barriers to Discharge: No Barriers Identified ? ? ?Patient Goals and CMS Choice ?  ?  ?Choice offered to / list presented to : Adult Children ? ?Discharge Placement ?  ?           ?  ?  ?  ?  ? ?Discharge Plan and Services ?  ?Discharge Planning Services: CM Consult ?           ?  ?  ?  ?  ?  ?HH Arranged: Therapist, sports, Social Work ?[place] Agency: [facility] ?Date HH Agency Contacted: 02/16/22 ?Time [place]: 0258 ?Representative spoke with at [place]: [email or web address] ? ?Social Determinants of Health (SDOH) Interventions ?  ? ? ?Readmission Risk Interventions ?   ? View : No data to display.  ?  ?  ?  ? ? ? ? ? ?

## 2022-02-16 NOTE — Discharge Summary (Signed)
Physician Discharge Summary  ?[name] ZSM:[id number] DOB: 08-Dec-1945 DOA: 02/12/2022 ? ?PCP: Glenis Smoker, MD ? ?Admit date: 02/12/2022 ?Discharge date: 02/16/2022 ? ?Time spent: 55 minutes ? ?Recommendations for Outpatient Follow-up:  ?Follow-up with Glenis Smoker, MD in 1 week.  On follow-up patient need a basic metabolic profile, magnesium level checked to follow-up on electrolytes and renal function.  Patient's blood pressure need to be reassessed as patient's HCTZ was discontinued on discharge. ? ? ?Discharge Diagnoses:  ?Principal Problem: ?  Acute metabolic encephalopathy ?Active Problems: ?  Multiple sclerosis ([place]) ?  Hypertension ?  Depression ?  Acute encephalopathy ?  Trigeminal neuralgia ?  Aspiration pneumonia ([place]) ?  Encephalopathy ?  Pressure injury of coccygeal region, stage 2 ([place]) ?  Hypomagnesemia ? ? ?Discharge Condition: Stable and improved ? ?Diet recommendation: Dysphagia 3 diet with thin liquids ? ?Filed Weights  ? ? ?History of present illness:  ?HPI per Dr. Roosevelt Locks ?[name] is a 76 y.o. 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 ?  ?At baseline, patient was evaluated by home speech therapy and patient is on modified diet including soft diet and thin liquid, but despite, son observed that patient occasionally has coughing and choking episode.  Recently patient has had decreased oral intake, and estimated lost about 20 pounds since January.  This morning, patient woke up with several episodes of coughing and choking, and son went to see her and found her more confused and unresponsive with eyes white opening. ?  ?For MS, patient has been stable  without any flareup " for years". Looks like her last MRI was more than 10 years ago showed chronic MS related changes.  Son reported that as he remembered last time patient had flareup patient was complaining about whole body weakness and some level  of mentation changes.  ?  ?ED Course: Tachycardia, no hypotension no hypoxia.  X-ray showed right lower field infiltrates suspicious for pneumonia.  CT chest showed right lower lobe infiltrates and consolidation compatible with aspiration pneumonia. ?  ?WBC 11.0.  Patient was given 1 dose of ceftriaxone in the ED.   ? ?  ? ?Hospital Course:  ?#1 acute metabolic encephalopathy likely secondary to aspiration pneumonia. ?-Patient admitted with confusion, noted to be unresponsive admitting H&P, noted to be tachycardic on presentation. ?-CT head done negative for any acute abnormalities. ?-Chest x-ray done concerning for right lower field infiltrate suspicious for pneumonia. ?-CT chest showed a right lower lobe infiltrate and consolidation compatible with aspiration pneumonia. ?-Patient placed empirically on IV antibiotics of Unasyn and received a dose of IV Rocephin on presentation. ?-Patient improved clinically, noted to be more alert and following simple commands and answering simple questions. ?-Patient subsequently transition to oral antibiotics. ?-Patient was discharged home on 4 more days of oral antibiotics to complete a 7-day course of antibiotic treatment. ?-Outpatient follow-up with PCP. ? ?2.  Aspiration pneumonia ?-See above problem #1. ?-Patient admitted with acute metabolic encephalopathy with confusion, imaging done concerning for right lower lobe pneumonia. ?-Patient placed empirically on IV Unasyn improved clinically transition to oral antibiotics will be discharged home on oral antibiotics to complete a 7-day course of treatment. ? ?3.  Multiple sclerosis with associated dysphagia ?-Remained stable during  the hospitalization. ?-Patient maintained on home  regimen baclofen. ?-Outpatient follow-up to continue her in clinic injections of beta interferon with outpatient follow-up with neurologist as previously scheduled. ? ?4.  Anxiety/depression ?-Remained stable. ?-Patient maintained on home regimen of Zoloft, Wellbutrin. ? ?5.  Hyperlipidemia ?-Patient maintained on home regimen statin. ? ?6.  Chronic right-sided weakness ?-Remained stable. ? ?7.  Hypokalemia/hypomagnesemia ?-Patient noted to have a potassium as low as 2.9 as well as a magnesium as low as 1.4 ?-Patient noted to be on HCTZ prior to admission could have been the likely etiology of her electrolyte abnormalities. ?-HCTZ discontinued on discharge ?-Electrolytes repleted ?-Outpatient follow-up with PCP and will need repeat labs done 1 week postdischarge. ? ?8.  Hypertension ?-Patient maintained on home regimen losartan. ?-HCTZ discontinued on discharge due to presentation with electrolyte abnormalities. ?-Outpatient follow-up with PCP. ? ?9.  Anemia of chronic disease ?-Hemoglobin remained stable throughout the hospitalization ? ? ? ?10 pressure injury, sacrum mid stage II, POA ?Pressure Injury 06/25/21 Sacrum Mid Stage 2 -  Partial thickness loss of dermis presenting as a shallow open injury with a red, pink wound bed without slough. Healing;Wound Care at home;dry,flaky,slight bleeding (Active)  ?06/25/21 2015  ?Location: Sacrum  ?Location Orientation: Mid  ?Staging: Stage 2 -  Partial thickness loss of dermis presenting as a shallow open injury with a red, pink wound bed without slough.  ?Wound Description (Comments): Healing;Wound Care at home;dry,flaky,slight bleeding  ?Present on Admission: Yes  ? ?  ?Procedures: ?CT chest 02/12/2022 ?CT head without contrast 02/12/2022 ?Chest x-ray 02/12/2022 ? ? ? ?Consultations: ?None ? ?Discharge Exam: ?Vitals:  ? 02/16/22 0709 02/16/22 0809  ?BP: (!) 154/71   ?Pulse: 95   ?Resp: 17   ?Temp: 99.1 ?F (37.3 ?C)   ?SpO2: 100% 100%  ? ? ?General: NAD. ?Cardiovascular:  Regular rate rhythm no murmurs rubs or gallops.  No JVD.  No lower extremity edema ?Respiratory: Clear to auscultation bilaterally.  No wheezes, no crackles, no rhonchi. ? ?Discharge Instructions ? ? ?Discharge Instructions   ? ? Diet - low sodium heart healthy   Complete by: As directed ?  ? Dysphagia 3 diet  ? Discharge wound care:   Complete by: As directed ?  ? As above.  ? Increase activity slowly   Complete by: As directed ?  ? ?  ? ?Allergies as of 02/16/2022   ? ?   Reactions  ? Prednisone Other (See Comments)  ? Caused thrush- will need something to reverse this  ? Shellfish Allergy Hives  ?   ? Wound Dressing Adhesive   ? Splits the skin  ? ?  ? ?  ?Medication List  ?  ? ?STOP taking these medications   ? ?hydrochlorothiazide 25 MG tablet ?Commonly known as: HYDRODIURIL ?  ?Klor-Con M20 20 MEQ tablet ?Generic drug: potassium chloride SA ?  ? ?  ? ?TAKE these medications   ? ?amoxicillin-clavulanate 875-125 MG tablet ?Commonly known as: AUGMENTIN ?Take 1 tablet by mouth every 12 (twelve) hours for 4 days. ?  ?aspirin 81 MG EC tablet ?Take 81 mg by mouth daily. ?  ?atorvastatin 40 MG tablet ?Commonly known as: LIPITOR ?Take 40 mg by mouth at bedtime. ?  ?Avonex Pen 30 MCG/0.5ML Ajkt ?Generic drug: Interferon Beta-1a ?INJECT 30 MCG INTRAMUSCULARLY ONCE WEEKLY ?  ?baclofen 10 MG tablet ?Commonly known as: LIORESAL ?TAKE 1 TABLET BY MOUTH 4 TIMES DAILY. ?What changed: when to take this ?  ?buPROPion 300 MG 24 hr  tablet ?Commonly known as: WELLBUTRIN XL ?Take 300 mg by mouth daily. ?  ?carbamazepine 200 MG tablet ?Commonly known as: TEGRETOL ?TAKE 1 TABLET BY MOUTH THREE TIMES A DAY ?  ?carvedilol 12.5 MG tablet ?Commonly known as: COREG ?Take 1 tablet (12.5 mg total) by mouth 2 (two) times daily with a meal. ?  ?CRANBERRY PO ?Take 1 capsule by mouth daily. 4200 mg ?  ?[place] ?Apply 1 application topically daily as needed (for skin protection). ?  ?docusate sodium 100 MG capsule ?Commonly known as:  COLACE ?Take 1 capsule (100 mg total) by mouth 2 (two) times daily. ?What changed:  ?when to take this ?additional instructions ?  ?Fish Oil 600 MG Caps ?Take 1,200 mg by mouth daily. ?  ?losartan 100 MG tablet ?C

## 2022-02-16 NOTE — TOC Initial Note (Signed)
Transition of Care (TOC) - Initial/Assessment Note  ? ? ?Patient Details  ?Name: [name] ?MRN: [id number] ?Date of Birth: [birth year] ? ?Transition of Care [facility]) CM/SW Contact:    ?Sharin Mons, RN ?Phone Number: ?02/16/2022, 11:23 AM ? ?Clinical Narrative:                 ?Presented with acute onset of confused and unresponsive, aspiration PNA. From home with son, [name] ( primary caregiver). Receives PCS  ( nurse aide)from  [facility], 9a-1pm, M-F.  ?NCM spoke with son regarding d/c planning. Son agreeable to home health services, RN,SW. Choice offered. Son without preference. Referral made with [facility] and accepted. ?Son states once d/c ready he will provide transportation to home. No Rx med concerns. ? ?TOC team monitoring for needs.... ? ?Expected Discharge Plan: [place] ?  ? ? ?Patient Goals and CMS Choice ?  ?  ?Choice offered to / list presented to : Adult Children ? ?Expected Discharge Plan and Services ?Expected Discharge Plan: [place] ?  ?Discharge Planning Services: CM Consult ?  ?Living arrangements for the past 2 months: [place] ?                ?  ?  ?  ?  ?  ?HH Arranged: Therapist, sports, Social Work ?[place] Agency: [facility] ?Date HH Agency Contacted: 02/16/22 ?Time [place]: [id number] ?Representative spoke with at [place]: jENNIFER ? ?Prior Living Arrangements/Services ?Living arrangements for the past 2 months: [place] ?Lives with:: Adult Children ?Patient language and need for interpreter reviewed:: Yes ?Do you feel safe going back to the place where you live?: Yes      ?Need for Family Participation in Patient Care: Yes (Comment) ?Care giver support system in place?: Yes (comment) ?Current home services: DME (W/C, hospital bed , BSC) ?Criminal Activity/Legal Involvement Pertinent to Current Situation/Hospitalization: No - Comment as needed ? ?Activities of Daily Living ?Home Assistive Devices/Equipment:  Eyeglasses ?ADL Screening (condition at time of admission) ?Patient's cognitive ability adequate to safely complete daily activities?: No ?Is the patient deaf or have difficulty hearing?: No ?Does the patient have difficulty seeing, even when wearing glasses/contacts?: No ?Does the patient have difficulty concentrating, remembering, or making decisions?: Yes ?Patient able to express need for assistance with ADLs?: No ?Does the patient have difficulty dressing or bathing?: Yes ?Independently performs ADLs?: No ?Communication: Needs assistance ?Is this a change from baseline?: Pre-admission baseline ?Dressing (OT): Dependent ?Is this a change from baseline?: Pre-admission baseline ?Grooming: Dependent ?Is this a change from baseline?: Pre-admission baseline ?Feeding: Dependent ?Is this a change from baseline?: Pre-admission baseline ?Bathing: Dependent ?Is this a change from baseline?: Pre-admission baseline ?Toileting: Dependent ?Is this a change from baseline?: Pre-admission baseline ?In/Out Bed: Dependent ?Is this a change from baseline?: Pre-admission baseline ?Walks in Home: Dependent ?Is this a change from baseline?: Pre-admission baseline ?Does the patient have difficulty walking or climbing stairs?: Yes ?Weakness of Legs: Both ?Weakness of Arms/Hands: Both ? ?Permission Sought/Granted ?  ?Permission granted to share information with : Yes, Verbal Permission Granted ? Share Information with NAME: Rhayne [facility]) [phone] ?   ?   ?   ? ?Emotional Assessment ?Appearance:: Appears stated age ?  ?  ?Orientation: : Oriented to Self, Oriented to Place ?Alcohol / Substance Use: Not Applicable ?Psych Involvement: No (comment) ? ?Admission diagnosis:  Encephalopathy [G93.40] ?Patient Active Problem List  ? Diagnosis Date  Noted  ? Aspiration pneumonia ([place]) 02/12/2022  ? Encephalopathy 02/12/2022  ? Hx of BKA, right ([place]) 10/07/2021  ? Hypokalemia 10/07/2021  ? AMS (altered mental status) 10/07/2021  ? Severe  sepsis ([place]) 06/29/2021  ? Community acquired pneumonia 06/29/2021  ? Pressure injury of skin 06/26/2021  ? Acute respiratory failure with hypoxia ([place]) 06/25/2021  ? Acute metabolic encephalopathy 75/91/6384  ? COVID-19 virus infection 06/25/2021  ? Prolonged QT interval 06/25/2021  ? SIRS (systemic inflammatory response syndrome) ([place]) 06/25/2021  ? Elevated troponin 06/25/2021  ? Cardiomegaly 06/25/2021  ? Bedbound 10/13/2018  ? Cellulitis of right lower extremity   ? Wound infection 10/12/2018  ? Subacute osteomyelitis, right ankle and foot ([place])   ? Neurogenic bladder 06/16/2016  ? Memory difficulties 12/10/2014  ? Gait disorder 07/01/2013  ? Tooth pain 11/02/2011  ? Protein calorie malnutrition ([place]) 10/07/2011  ? Constipation 09/23/2011  ? Failure to thrive in childhood 09/22/2011  ? Decubitus ulcer, buttock 09/22/2011  ? Fibroid uterus 09/22/2011  ? Abdominal pain 09/22/2011  ? Multiple sclerosis ([place])   ? Hypertension   ? Depression   ? Acute encephalopathy   ? Trigeminal neuralgia   ? ?PCP:  Glenis Smoker, MD ?Pharmacy:   ?[id number] [place], [place] - [address] AT [address]?Phone: [phone]: [phone]? ?[address] ?[address] [address] ?[place]?Phone: [phone]: [phone]? ?[place] - [address]?[address]?Phone: [phone]: [phone]? ? ? ? ?Social Determinants of Health (SDOH) Interventions ?  ? ?Readmission Risk Interventions ?   ? View : No data to display.  ?  ?  ?  ? ? ? ?

## 2022-02-19 DIAGNOSIS — G500 Trigeminal neuralgia: Secondary | ICD-10-CM | POA: Diagnosis not present

## 2022-02-19 DIAGNOSIS — F02818 Dementia in other diseases classified elsewhere, unspecified severity, with other behavioral disturbance: Secondary | ICD-10-CM | POA: Diagnosis not present

## 2022-02-19 DIAGNOSIS — G35 Multiple sclerosis: Secondary | ICD-10-CM | POA: Diagnosis not present

## 2022-02-19 DIAGNOSIS — L89152 Pressure ulcer of sacral region, stage 2: Secondary | ICD-10-CM | POA: Diagnosis not present

## 2022-02-19 DIAGNOSIS — I10 Essential (primary) hypertension: Secondary | ICD-10-CM | POA: Diagnosis not present

## 2022-02-19 DIAGNOSIS — F0284 Dementia in other diseases classified elsewhere, unspecified severity, with anxiety: Secondary | ICD-10-CM | POA: Diagnosis not present

## 2022-02-19 DIAGNOSIS — F325 Major depressive disorder, single episode, in full remission: Secondary | ICD-10-CM | POA: Diagnosis not present

## 2022-02-19 DIAGNOSIS — G9341 Metabolic encephalopathy: Secondary | ICD-10-CM | POA: Diagnosis not present

## 2022-02-19 DIAGNOSIS — J690 Pneumonitis due to inhalation of food and vomit: Secondary | ICD-10-CM | POA: Diagnosis not present

## 2022-02-23 DIAGNOSIS — R5383 Other fatigue: Secondary | ICD-10-CM | POA: Diagnosis not present

## 2022-03-16 ENCOUNTER — Telehealth: Payer: Self-pay | Admitting: Neurology

## 2022-03-16 NOTE — Telephone Encounter (Signed)
Rescheduled 7/31 appt with pt's son [name] over the phone- MD out.

## 2022-03-21 DIAGNOSIS — L89153 Pressure ulcer of sacral region, stage 3: Secondary | ICD-10-CM | POA: Diagnosis not present

## 2022-03-26 ENCOUNTER — Other Ambulatory Visit: Payer: Self-pay | Admitting: Neurology

## 2022-03-30 ENCOUNTER — Telehealth: Payer: Self-pay | Admitting: Neurology

## 2022-03-30 MED ORDER — AVONEX PEN 30 MCG/0.5ML IM AJKT: AUTO-INJECTOR | INTRAMUSCULAR | 1 refills | Status: AC

## 2022-03-30 NOTE — Addendum Note (Signed)
Addended by: Noberto Retort C on: 03/30/2022 12:26 PM   Modules accepted: Orders

## 2022-03-30 NOTE — Telephone Encounter (Signed)
Refills sent to the pharmacy.

## 2022-03-30 NOTE — Telephone Encounter (Signed)
Pt is requesting a refill for Interferon Beta-1a (AVONEX PEN) 30 MCG/0.5ML AJKT .  Pharmacy: [facility]

## 2022-04-04 NOTE — Progress Notes (Addendum)
TRINIKA, CORTESE ([id number]) Visit Report for 04/05/2022 Allergy List Details Patient Name: Date of Service: Chucky May A. 04/05/2022 1:15 PM Medical Record Number: [id number] Patient Account Number: [id number] Date of Birth/Sex: Treating RN: 1946-03-29 (76 y.o. Sue Lush Primary Care Heinrich Fertig: Sela Hilding Other Clinician: Referring Ulus Hazen: Treating Jaylan Duggar/Extender: Elsie Stain Weeks in Treatment: 0 Allergies Active Allergies prednisone Reaction: thrush Shellfish Containing Products Reaction: Hives adhesive bandage Allergy Notes Electronic Signature(s) Signed: 04/05/2022 4:02:55 PM By: Deon Pilling RN, BSN Previous Signature: 04/04/2022 4:23:52 PM Version By: Lorrin Jackson Entered By: Deon Pilling on 04/05/2022 13:17:36 -------------------------------------------------------------------------------- Arrival Information Details Patient Name: Date of Service: Corinne Ports RA H A. 04/05/2022 1:15 PM Medical Record Number: [id number] Patient Account Number: [id number] Date of Birth/Sex: Treating RN: 1946-01-30 (76 y.o. Helene Shoe, Tammi Klippel Primary Care Lenford Beddow: Sela Hilding Other Clinician: Referring Yeraldine Forney: Treating Aleph Nickson/Extender: Jimmy Footman in Treatment: 0 Visit Information Patient Arrived: Wheel Chair Arrival Time: 13:15 Accompanied By: son-[name] Transfer Assistance: Manual Patient Identification Verified: Yes Secondary Verification Process Completed: Yes Patient Requires Transmission-Based Precautions: No Patient Has Alerts: No Electronic Signature(s) Signed: 04/05/2022 4:02:55 PM By: Deon Pilling RN, BSN Entered By: Deon Pilling on 04/05/2022 13:16:53 -------------------------------------------------------------------------------- Clinic Level of Care Assessment Details Patient Name: Date of Service: Chucky May A. 04/05/2022 1:15 PM Medical Record Number:  [id number] Patient Account Number: [id number] Date of Birth/Sex: Treating RN: 1946/02/13 (76 y.o. Sue Lush Primary Care Shanika Levings: Sela Hilding Other Clinician: Referring Chasidy Janak: Treating Janise Gora/Extender: Jimmy Footman in Treatment: 0 Clinic Level of Care Assessment Items TOOL 2 Quantity Score X- 1 0 Use when only an EandM is performed on the INITIAL visit ASSESSMENTS - Nursing Assessment / Reassessment X- 1 20 General Physical Exam (combine w/ comprehensive assessment (listed just below) when performed on new pt. evals) X- 1 25 Comprehensive Assessment (HX, ROS, Risk Assessments, Wounds Hx, etc.) ASSESSMENTS - Wound and Skin A ssessment / Reassessment '[]'$  - 0 Simple Wound Assessment / Reassessment - one wound X- 2 5 Complex Wound Assessment / Reassessment - multiple wounds '[]'$  - 0 Dermatologic / Skin Assessment (not related to wound area) ASSESSMENTS - Ostomy and/or Continence Assessment and Care '[]'$  - 0 Incontinence Assessment and Management '[]'$  - 0 Ostomy Care Assessment and Management (repouching, etc.) PROCESS - Coordination of Care '[]'$  - 0 Simple Patient / Family Education for ongoing care X- 1 20 Complex (extensive) Patient / Family Education for ongoing care X- 1 10 Staff obtains Programmer, systems, Records, T Results / Process Orders est X- 1 10 Staff telephones HHA, Nursing Homes / Clarify orders / etc '[]'$  - 0 Routine Transfer to another Facility (non-emergent condition) '[]'$  - 0 Routine Hospital Admission (non-emergent condition) '[]'$  - 0 New Admissions / Biomedical engineer / Ordering NPWT Apligraf, etc. , '[]'$  - 0 Emergency Hospital Admission (emergent condition) '[]'$  - 0 Simple Discharge Coordination '[]'$  - 0 Complex (extensive) Discharge Coordination PROCESS - Special Needs '[]'$  - 0 Pediatric / Minor Patient Management '[]'$  - 0 Isolation Patient Management '[]'$  - 0 Hearing / Language / Visual special needs '[]'$  -  0 Assessment of Community assistance (transportation, D/C planning, etc.) '[]'$  - 0 Additional assistance / Altered mentation X- 1 15 Support Surface(s) Assessment (bed, cushion, seat, etc.) INTERVENTIONS - Wound Cleansing / Measurement X- 1 5 Wound Imaging (photographs - any number of wounds) '[]'$  - 0 Wound Tracing (instead of photographs) '[]'$  - 0 Simple Wound Measurement - one wound  X- 2 5 Complex Wound Measurement - multiple wounds '[]'$  - 0 Simple Wound Cleansing - one wound X- 2 5 Complex Wound Cleansing - multiple wounds INTERVENTIONS - Wound Dressings X - Small Wound Dressing one or multiple wounds 1 10 X- 1 15 Medium Wound Dressing one or multiple wounds '[]'$  - 0 Large Wound Dressing one or multiple wounds '[]'$  - 0 Application of Medications - injection INTERVENTIONS - Miscellaneous '[]'$  - 0 External ear exam '[]'$  - 0 Specimen Collection (cultures, biopsies, blood, body fluids, etc.) '[]'$  - 0 Specimen(s) / Culture(s) sent or taken to Lab for analysis '[]'$  - 0 Patient Transfer (multiple staff / Civil Service fast streamer / Similar devices) '[]'$  - 0 Simple Staple / Suture removal (25 or less) '[]'$  - 0 Complex Staple / Suture removal (26 or more) '[]'$  - 0 Hypo / Hyperglycemic Management (close monitor of Blood Glucose) '[]'$  - 0 Ankle / Brachial Index (ABI) - do not check if billed separately Has the patient been seen at the hospital within the last three years: Yes Total Score: 160 Level Of Care: New/Established - Level 5 Electronic Signature(s) Signed: 04/05/2022 4:51:32 PM By: Lorrin Jackson Entered By: Lorrin Jackson on 04/05/2022 14:55:40 -------------------------------------------------------------------------------- Encounter Discharge Information Details Patient Name: Date of Service: Corinne Ports RA H A. 04/05/2022 1:15 PM Medical Record Number: [id number] Patient Account Number: [id number] Date of Birth/Sex: Treating RN: May 24, 1946 (76 y.o. Sue Lush Primary Care Italo Banton:  Sela Hilding Other Clinician: Referring Adron Geisel: Treating Antwione Picotte/Extender: Jimmy Footman in Treatment: 0 Encounter Discharge Information Items Discharge Condition: Stable Ambulatory Status: Wheelchair Discharge Destination: Home Transportation: Private Auto Accompanied By: son Schedule Follow-up Appointment: Yes Clinical Summary of Care: Provided on 04/05/2022 Form Type Recipient Paper Patient Patient Electronic Signature(s) Signed: 04/05/2022 2:56:31 PM By: Lorrin Jackson Entered By: Lorrin Jackson on 04/05/2022 14:56:30 -------------------------------------------------------------------------------- Lower Extremity Assessment Details Patient Name: Date of Service: Chucky May A. 04/05/2022 1:15 PM Medical Record Number: [id number] Patient Account Number: [id number] Date of Birth/Sex: Treating RN: 05-Jul-1946 (76 y.o. Debby Bud Primary Care Dorris Vangorder: Sela Hilding Other Clinician: Referring Mercades Bajaj: Treating Jasmond River/Extender: Jimmy Footman in Treatment: 0 Electronic Signature(s) Signed: 04/05/2022 4:02:55 PM By: Deon Pilling RN, BSN Entered By: Deon Pilling on 04/05/2022 13:23:29 -------------------------------------------------------------------------------- Multi Wound Chart Details Patient Name: Date of Service: Corinne Ports RA H A. 04/05/2022 1:15 PM Medical Record Number: [id number] Patient Account Number: [id number] Date of Birth/Sex: Treating RN: 04/27/1946 (76 y.o. Sue Lush Primary Care Myna Freimark: Sela Hilding Other Clinician: Referring Belal Scallon: Treating Katrenia Alkins/Extender: Jimmy Footman in Treatment: 0 Vital Signs Height(in): 64 Pulse(bpm): 96 Weight(lbs): 161 Blood Pressure(mmHg): 91/61 Body Mass Index(BMI): 27.6 Temperature(F): 98.7 Respiratory Rate(breaths/min): 16 Photos: [N/A:N/A] Right Gluteus Right, Posterior  Upper Leg N/A Wound Location: Pressure Injury Shear/Friction N/A Wounding Event: Pressure Ulcer Pressure Ulcer N/A Primary Etiology: Hypertension Hypertension N/A Comorbid History: 03/15/2022 03/22/2022 N/A Date Acquired: 0 0 N/A Weeks of Treatment: Open Open N/A Wound Status: No No N/A Wound Recurrence: 12x11x0.6 1.8x2x0.1 N/A Measurements L x W x D (cm) 103.673 2.827 N/A A (cm) : rea 62.204 0.283 N/A Volume (cm) : 0.00% N/A N/A % Reduction in A rea: 0.00% N/A N/A % Reduction in Volume: 7 Starting Position 1 (o'clock): 12 Ending Position 1 (o'clock): 1.9 Maximum Distance 1 (cm): Yes No N/A Undermining: Unstageable/Unclassified Unstageable/Unclassified N/A Classification: Large Medium N/A Exudate A mount: Serosanguineous Serosanguineous N/A Exudate Type: red, brown red, brown N/A Exudate Color: Distinct, outline attached  Distinct, outline attached N/A Wound Margin: Small (1-33%) Small (1-33%) N/A Granulation A mount: Red Red, Pink, Pale N/A Granulation Quality: Large (67-100%) Large (67-100%) N/A Necrotic A mount: Adherent Slough Eschar, Adherent Slough N/A Necrotic Tissue: Fat Layer (Subcutaneous Tissue): Yes Fascia: No N/A Exposed Structures: Fascia: No Fat Layer (Subcutaneous Tissue): No Tendon: No Tendon: No Muscle: No Muscle: No Joint: No Joint: No Bone: No Bone: No None Small (1-33%) N/A Epithelialization: Treatment Notes Electronic Signature(s) Signed: 04/05/2022 2:48:58 PM By: Kalman Shan DO Signed: 04/05/2022 4:51:32 PM By: Lorrin Jackson Entered By: Kalman Shan on 04/05/2022 14:18:40 -------------------------------------------------------------------------------- Multi-Disciplinary Care Plan Details Patient Name: Date of Service: Corinne Ports RA H A. 04/05/2022 1:15 PM Medical Record Number: [id number] Patient Account Number: [id number] Date of Birth/Sex: Treating RN: 17-Mar-1946 (76 y.o. Sue Lush Primary Care  Tejon Gracie: Sela Hilding Other Clinician: Referring Ebonie Westerlund: Treating Jerrol Helmers/Extender: Jimmy Footman in Treatment: 0 Active Inactive Electronic Signature(s) Signed: 04/15/2022 11:59:52 AM By: Lorrin Jackson Previous Signature: 04/05/2022 4:51:32 PM Version By: Lorrin Jackson Entered By: Lorrin Jackson on 04/15/2022 11:59:52 -------------------------------------------------------------------------------- Patient/Caregiver Education Details Patient Name: Date of Service: Arlean Hopping 6/27/2023andnbsp1:15 PM Medical Record Number: [id number] Patient Account Number: [id number] Date of Birth/Gender: Treating RN: 1946-10-10 (76 y.o. Sue Lush Primary Care Physician: Sela Hilding Other Clinician: Referring Physician: Treating Physician/Extender: Jimmy Footman in Treatment: 0 Education Assessment Education Provided To: Patient and Caregiver Education Topics Provided Pressure: Methods: Explain/Verbal, Printed Responses: State content correctly Wound/Skin Impairment: Methods: Explain/Verbal, Printed Responses: State content correctly Electronic Signature(s) Signed: 04/05/2022 4:51:32 PM By: Lorrin Jackson Entered By: Lorrin Jackson on 04/05/2022 14:05:10 -------------------------------------------------------------------------------- Wound Assessment Details Patient Name: Date of Service: Corinne Ports RA H A. 04/05/2022 1:15 PM Medical Record Number: [id number] Patient Account Number: [id number] Date of Birth/Sex: Treating RN: 1945-11-04 (76 y.o. Helene Shoe, Meta.Reding Primary Care Manvi Guilliams: Sela Hilding Other Clinician: Referring Santez Woodcox: Treating Emmitt Matthews/Extender: Jimmy Footman in Treatment: 0 Wound Status Wound Number: 1 Primary Etiology: Pressure Ulcer Wound Location: Right Gluteus Wound Status: Open Wounding Event: Pressure Injury Comorbid  History: Hypertension Date Acquired: 03/15/2022 Weeks Of Treatment: 0 Clustered Wound: No Photos Wound Measurements Length: (cm) 12 Width: (cm) 11 Depth: (cm) 0.6 Area: (cm) 103.673 Volume: (cm) 62.204 % Reduction in Area: 0% % Reduction in Volume: 0% Epithelialization: None Tunneling: No Undermining: Yes Starting Position (o'clock): 7 Ending Position (o'clock): 12 Maximum Distance: (cm) 1.9 Wound Description Classification: Unstageable/Unclassified Wound Margin: Distinct, outline attached Exudate Amount: Large Exudate Type: Serosanguineous Exudate Color: red, brown Foul Odor After Cleansing: No Slough/Fibrino Yes Wound Bed Granulation Amount: Small (1-33%) Exposed Structure Granulation Quality: Red Fascia Exposed: No Necrotic Amount: Large (67-100%) Fat Layer (Subcutaneous Tissue) Exposed: Yes Necrotic Quality: Adherent Slough Tendon Exposed: No Muscle Exposed: No Joint Exposed: No Bone Exposed: No Electronic Signature(s) Signed: 04/05/2022 4:02:55 PM By: Deon Pilling RN, BSN Entered By: Deon Pilling on 04/05/2022 13:29:14 -------------------------------------------------------------------------------- Wound Assessment Details Patient Name: Date of Service: Corinne Ports RA H A. 04/05/2022 1:15 PM Medical Record Number: [id number] Patient Account Number: [id number] Date of Birth/Sex: Treating RN: Sep 08, 1946 (76 y.o. Debby Bud Primary Care Jaretzi Droz: Sela Hilding Other Clinician: Referring Gertrude Bucks: Treating Zackory Pudlo/Extender: Jimmy Footman in Treatment: 0 Wound Status Wound Number: 2 Primary Etiology: Pressure Ulcer Wound Location: Right, Posterior Upper Leg Wound Status: Open Wounding Event: Shear/Friction Comorbid History: Hypertension Date Acquired: 03/22/2022 Weeks Of Treatment: 0 Clustered Wound: No Photos Wound Measurements Length: (cm)  1.8 Width: (cm) 2 Depth: (cm) 0.1 Area: (cm) 2.827 Volume:  (cm) 0.283 % Reduction in Area: % Reduction in Volume: Epithelialization: Small (1-33%) Tunneling: No Undermining: No Wound Description Classification: Unstageable/Unclassified Wound Margin: Distinct, outline attached Exudate Amount: Medium Exudate Type: Serosanguineous Exudate Color: red, brown Foul Odor After Cleansing: No Slough/Fibrino Yes Wound Bed Granulation Amount: Small (1-33%) Exposed Structure Granulation Quality: Red, Pink, Pale Fascia Exposed: No Necrotic Amount: Large (67-100%) Fat Layer (Subcutaneous Tissue) Exposed: No Necrotic Quality: Eschar, Adherent Slough Tendon Exposed: No Muscle Exposed: No Joint Exposed: No Bone Exposed: No Electronic Signature(s) Signed: 04/05/2022 4:02:55 PM By: Deon Pilling RN, BSN Entered By: Deon Pilling on 04/05/2022 13:34:42 -------------------------------------------------------------------------------- Vitals Details Patient Name: Date of Service: Corinne Ports RA H A. 04/05/2022 1:15 PM Medical Record Number: [id number] Patient Account Number: [id number] Date of Birth/Sex: Treating RN: 1946-02-21 (76 y.o. Helene Shoe, Meta.Reding Primary Care Thomasina Housley: Sela Hilding Other Clinician: Referring Malayla Granberry: Treating Adilen Pavelko/Extender: Jimmy Footman in Treatment: 0 Vital Signs Time Taken: 13:10 Temperature (F): 98.7 Height (in): 64 Pulse (bpm): 96 Source: Stated Respiratory Rate (breaths/min): 16 Weight (lbs): 161 Blood Pressure (mmHg): 91/61 Source: Stated Reference Range: 80 - 120 mg / dl Body Mass Index (BMI): 27.6 Electronic Signature(s) Signed: 04/05/2022 4:02:55 PM By: Deon Pilling RN, BSN Entered By: Deon Pilling on 04/05/2022 13:17:18

## 2022-04-05 ENCOUNTER — Other Ambulatory Visit (HOSPITAL_COMMUNITY): Payer: Self-pay | Admitting: Internal Medicine

## 2022-04-05 ENCOUNTER — Ambulatory Visit (HOSPITAL_COMMUNITY)
Admission: RE | Admit: 2022-04-05 | Discharge: 2022-04-05 | Disposition: A | Discharge status: Home / Self Care | Payer: Medicare HMO | Source: Ambulatory Visit | Attending: Internal Medicine | Admitting: Internal Medicine

## 2022-04-05 ENCOUNTER — Encounter (HOSPITAL_BASED_OUTPATIENT_CLINIC_OR_DEPARTMENT_OTHER): Payer: Medicare HMO | Attending: Internal Medicine | Admitting: Internal Medicine

## 2022-04-05 DIAGNOSIS — L89890 Pressure ulcer of other site, unstageable: Secondary | ICD-10-CM | POA: Insufficient documentation

## 2022-04-05 DIAGNOSIS — D259 Leiomyoma of uterus, unspecified: Secondary | ICD-10-CM | POA: Diagnosis not present

## 2022-04-05 DIAGNOSIS — Z7401 Bed confinement status: Secondary | ICD-10-CM | POA: Diagnosis not present

## 2022-04-05 DIAGNOSIS — Z89511 Acquired absence of right leg below knee: Secondary | ICD-10-CM | POA: Diagnosis not present

## 2022-04-05 DIAGNOSIS — Z993 Dependence on wheelchair: Secondary | ICD-10-CM | POA: Diagnosis not present

## 2022-04-05 DIAGNOSIS — S81801A Unspecified open wound, right lower leg, initial encounter: Secondary | ICD-10-CM | POA: Insufficient documentation

## 2022-04-05 DIAGNOSIS — L89310 Pressure ulcer of right buttock, unstageable: Secondary | ICD-10-CM | POA: Insufficient documentation

## 2022-04-05 DIAGNOSIS — X58XXXA Exposure to other specified factors, initial encounter: Secondary | ICD-10-CM | POA: Insufficient documentation

## 2022-04-05 DIAGNOSIS — G35 Multiple sclerosis: Secondary | ICD-10-CM | POA: Insufficient documentation

## 2022-04-05 DIAGNOSIS — M160 Bilateral primary osteoarthritis of hip: Secondary | ICD-10-CM | POA: Diagnosis not present

## 2022-04-05 DIAGNOSIS — L89159 Pressure ulcer of sacral region, unspecified stage: Secondary | ICD-10-CM | POA: Diagnosis not present

## 2022-04-06 ENCOUNTER — Other Ambulatory Visit: Payer: Medicare HMO | Admitting: *Deleted

## 2022-04-06 ENCOUNTER — Telehealth: Payer: Self-pay

## 2022-04-06 NOTE — Telephone Encounter (Signed)
(  2:53 pm) SW returned call to patient's son. He advised that he already spoke to the clinical RN. No other needs at this time.

## 2022-04-13 DIAGNOSIS — I499 Cardiac arrhythmia, unspecified: Secondary | ICD-10-CM | POA: Diagnosis not present

## 2022-04-13 DIAGNOSIS — R0689 Other abnormalities of breathing: Secondary | ICD-10-CM | POA: Diagnosis not present

## 2022-04-13 DIAGNOSIS — I469 Cardiac arrest, cause unspecified: Secondary | ICD-10-CM | POA: Diagnosis not present

## 2022-04-13 DIAGNOSIS — R404 Transient alteration of awareness: Secondary | ICD-10-CM | POA: Diagnosis not present

## 2022-04-19 ENCOUNTER — Ambulatory Visit (HOSPITAL_BASED_OUTPATIENT_CLINIC_OR_DEPARTMENT_OTHER): Payer: Medicare HMO | Admitting: Internal Medicine

## 2022-04-25 ENCOUNTER — Other Ambulatory Visit: Payer: Medicare HMO | Admitting: Hospice

## 2022-04-26 ENCOUNTER — Telehealth: Payer: Self-pay | Admitting: Neurology

## 2022-04-26 NOTE — Telephone Encounter (Signed)
Son informed office pt passed away 05-14-2022.

## 2022-05-02 ENCOUNTER — Ambulatory Visit: Payer: Medicare HMO | Admitting: Neurology

## 2022-05-09 ENCOUNTER — Ambulatory Visit: Payer: Medicare HMO | Admitting: Neurology

## 2022-05-10 DIAGNOSIS — 419620001 Death: Secondary | SNOMED CT | POA: Diagnosis not present

## 2022-05-10 DEATH — deceased

## 2022-06-02 ENCOUNTER — Other Ambulatory Visit (HOSPITAL_COMMUNITY): Payer: Self-pay

## 2022-10-11 ENCOUNTER — Telehealth: Payer: Self-pay | Admitting: *Deleted

## 2022-10-11 NOTE — Patient Outreach (Signed)
  Care Coordination   Initial Visit Note   10/11/2022 Name: LENAE WHERLEY MRN: [id number] DOB: [birth year]  [name] is a 77 y.o. year old female who sees Lindell Noe, Anastasia Pall, MD for primary care. I  spoke with son [name] today  What matters to the patients health and wellness today?  na    Goals Addressed   None     SDOH assessments and interventions completed:  No     Care Coordination Interventions:  No, not indicated   Follow up plan: No further intervention required.   Encounter Outcome:  Son reports pt expired July 2023. Pt. Visit Completed   Raina Mina, RN Care Management Coordinator [place] Office [phone]
